# Patient Record
Sex: Female | Born: 1965 | Race: White | Hispanic: No | Marital: Married | Smoking: Former smoker
Health system: Southern US, Community
[De-identification: ages and names within clinical notes are randomized; demographics above are authoritative.]

## PROBLEM LIST (undated history)

## (undated) DIAGNOSIS — I10 Essential (primary) hypertension: Secondary | ICD-10-CM

## (undated) DIAGNOSIS — T7840XA Allergy, unspecified, initial encounter: Secondary | ICD-10-CM

## (undated) DIAGNOSIS — I493 Ventricular premature depolarization: Secondary | ICD-10-CM

## (undated) DIAGNOSIS — G43909 Migraine, unspecified, not intractable, without status migrainosus: Secondary | ICD-10-CM

## (undated) DIAGNOSIS — Z5189 Encounter for other specified aftercare: Secondary | ICD-10-CM

## (undated) DIAGNOSIS — F419 Anxiety disorder, unspecified: Secondary | ICD-10-CM

## (undated) DIAGNOSIS — D649 Anemia, unspecified: Secondary | ICD-10-CM

## (undated) DIAGNOSIS — E785 Hyperlipidemia, unspecified: Secondary | ICD-10-CM

## (undated) DIAGNOSIS — E079 Disorder of thyroid, unspecified: Secondary | ICD-10-CM

## (undated) DIAGNOSIS — G2581 Restless legs syndrome: Secondary | ICD-10-CM

## (undated) HISTORY — DX: Disorder of thyroid, unspecified: E07.9

## (undated) HISTORY — DX: Allergy, unspecified, initial encounter: T78.40XA

## (undated) HISTORY — DX: Ventricular premature depolarization: I49.3

## (undated) HISTORY — PX: CARPAL TUNNEL RELEASE: SHX101

## (undated) HISTORY — DX: Migraine, unspecified, not intractable, without status migrainosus: G43.909

## (undated) HISTORY — DX: Hyperlipidemia, unspecified: E78.5

## (undated) HISTORY — DX: Anemia, unspecified: D64.9

## (undated) HISTORY — DX: Essential (primary) hypertension: I10

## (undated) HISTORY — DX: Anxiety disorder, unspecified: F41.9

## (undated) HISTORY — PX: UPPER GASTROINTESTINAL ENDOSCOPY: SHX188

## (undated) HISTORY — PX: ENDOMETRIAL ABLATION: SHX621

## (undated) HISTORY — DX: Restless legs syndrome: G25.81

## (undated) HISTORY — DX: Encounter for other specified aftercare: Z51.89

---

## 1996-08-25 HISTORY — PX: ECTOPIC PREGNANCY SURGERY: SHX613

## 1999-04-26 HISTORY — PX: TUBAL LIGATION: SHX77

## 1999-08-25 ENCOUNTER — Emergency Department (HOSPITAL_COMMUNITY): Admission: EM | Admit: 1999-08-25 | Discharge: 1999-08-25 | Payer: Self-pay | Admitting: *Deleted

## 2000-10-19 ENCOUNTER — Other Ambulatory Visit: Admission: RE | Admit: 2000-10-19 | Discharge: 2000-10-19 | Payer: Self-pay | Admitting: Internal Medicine

## 2001-06-07 ENCOUNTER — Ambulatory Visit (HOSPITAL_COMMUNITY): Admission: RE | Admit: 2001-06-07 | Discharge: 2001-06-07 | Payer: Self-pay | Admitting: Internal Medicine

## 2001-06-07 ENCOUNTER — Encounter: Payer: Self-pay | Admitting: Internal Medicine

## 2001-06-15 ENCOUNTER — Encounter: Payer: Self-pay | Admitting: Internal Medicine

## 2001-06-15 ENCOUNTER — Ambulatory Visit (HOSPITAL_COMMUNITY): Admission: RE | Admit: 2001-06-15 | Discharge: 2001-06-15 | Payer: Self-pay | Admitting: Internal Medicine

## 2001-12-21 ENCOUNTER — Other Ambulatory Visit: Admission: RE | Admit: 2001-12-21 | Discharge: 2001-12-21 | Payer: Self-pay | Admitting: Internal Medicine

## 2001-12-25 ENCOUNTER — Ambulatory Visit (HOSPITAL_COMMUNITY): Admission: RE | Admit: 2001-12-25 | Discharge: 2001-12-25 | Payer: Self-pay | Admitting: Internal Medicine

## 2001-12-25 ENCOUNTER — Encounter: Payer: Self-pay | Admitting: Internal Medicine

## 2002-08-12 ENCOUNTER — Ambulatory Visit (HOSPITAL_BASED_OUTPATIENT_CLINIC_OR_DEPARTMENT_OTHER): Admission: RE | Admit: 2002-08-12 | Discharge: 2002-08-12 | Payer: Self-pay | Admitting: Orthopedic Surgery

## 2003-01-10 ENCOUNTER — Other Ambulatory Visit: Admission: RE | Admit: 2003-01-10 | Discharge: 2003-01-10 | Payer: Self-pay | Admitting: Obstetrics and Gynecology

## 2003-06-26 HISTORY — PX: CRYOABLATION: SHX1415

## 2004-01-24 HISTORY — PX: SEPTOPLASTY: SUR1290

## 2004-02-08 ENCOUNTER — Encounter (INDEPENDENT_AMBULATORY_CARE_PROVIDER_SITE_OTHER): Payer: Self-pay | Admitting: *Deleted

## 2004-02-08 ENCOUNTER — Ambulatory Visit (HOSPITAL_BASED_OUTPATIENT_CLINIC_OR_DEPARTMENT_OTHER): Admission: RE | Admit: 2004-02-08 | Discharge: 2004-02-08 | Payer: Self-pay | Admitting: Otolaryngology

## 2004-02-08 ENCOUNTER — Ambulatory Visit (HOSPITAL_COMMUNITY): Admission: RE | Admit: 2004-02-08 | Discharge: 2004-02-08 | Payer: Self-pay | Admitting: Otolaryngology

## 2004-12-18 ENCOUNTER — Ambulatory Visit: Payer: Self-pay | Admitting: Family Medicine

## 2004-12-18 ENCOUNTER — Other Ambulatory Visit: Admission: RE | Admit: 2004-12-18 | Discharge: 2004-12-18 | Payer: Self-pay | Admitting: Family Medicine

## 2004-12-20 ENCOUNTER — Encounter: Admission: RE | Admit: 2004-12-20 | Discharge: 2004-12-20 | Payer: Self-pay | Admitting: Family Medicine

## 2005-06-10 ENCOUNTER — Emergency Department (HOSPITAL_COMMUNITY): Admission: EM | Admit: 2005-06-10 | Discharge: 2005-06-10 | Payer: Self-pay | Admitting: Emergency Medicine

## 2005-06-16 ENCOUNTER — Ambulatory Visit: Payer: Self-pay | Admitting: Family Medicine

## 2005-06-26 ENCOUNTER — Encounter: Admission: RE | Admit: 2005-06-26 | Discharge: 2005-06-26 | Payer: Self-pay | Admitting: Family Medicine

## 2005-08-26 ENCOUNTER — Ambulatory Visit: Payer: Self-pay | Admitting: Internal Medicine

## 2005-11-17 ENCOUNTER — Ambulatory Visit: Payer: Self-pay | Admitting: Family Medicine

## 2005-11-17 ENCOUNTER — Other Ambulatory Visit: Admission: RE | Admit: 2005-11-17 | Discharge: 2005-11-17 | Payer: Self-pay | Admitting: Family Medicine

## 2005-12-17 ENCOUNTER — Encounter: Admission: RE | Admit: 2005-12-17 | Discharge: 2005-12-17 | Payer: Self-pay | Admitting: Family Medicine

## 2006-02-24 ENCOUNTER — Ambulatory Visit: Payer: Self-pay | Admitting: Family Medicine

## 2007-02-22 ENCOUNTER — Ambulatory Visit: Payer: Self-pay | Admitting: Family Medicine

## 2007-02-22 ENCOUNTER — Encounter: Payer: Self-pay | Admitting: Family Medicine

## 2007-02-22 ENCOUNTER — Other Ambulatory Visit: Admission: RE | Admit: 2007-02-22 | Discharge: 2007-02-22 | Payer: Self-pay | Admitting: Family Medicine

## 2007-02-22 DIAGNOSIS — O0081 Other ectopic pregnancy with intrauterine pregnancy: Secondary | ICD-10-CM | POA: Insufficient documentation

## 2007-02-22 DIAGNOSIS — R51 Headache: Secondary | ICD-10-CM | POA: Insufficient documentation

## 2007-02-22 DIAGNOSIS — Z8669 Personal history of other diseases of the nervous system and sense organs: Secondary | ICD-10-CM | POA: Insufficient documentation

## 2007-02-22 DIAGNOSIS — E039 Hypothyroidism, unspecified: Secondary | ICD-10-CM | POA: Insufficient documentation

## 2007-02-22 DIAGNOSIS — J342 Deviated nasal septum: Secondary | ICD-10-CM | POA: Insufficient documentation

## 2007-02-22 DIAGNOSIS — Z9889 Other specified postprocedural states: Secondary | ICD-10-CM | POA: Insufficient documentation

## 2007-02-22 DIAGNOSIS — R519 Headache, unspecified: Secondary | ICD-10-CM | POA: Insufficient documentation

## 2007-02-23 LAB — CONVERTED CEMR LAB
ALT: 18 units/L (ref 0–35)
AST: 21 units/L (ref 0–37)
Albumin: 3.9 g/dL (ref 3.5–5.2)
Alkaline Phosphatase: 62 units/L (ref 39–117)
BUN: 11 mg/dL (ref 6–23)
Basophils Absolute: 0 10*3/uL (ref 0.0–0.1)
Basophils Relative: 0.8 % (ref 0.0–1.0)
Bilirubin, Direct: 0.1 mg/dL (ref 0.0–0.3)
CO2: 27 meq/L (ref 19–32)
Calcium: 9.1 mg/dL (ref 8.4–10.5)
Chloride: 111 meq/L (ref 96–112)
Cholesterol: 249 mg/dL (ref 0–200)
Creatinine, Ser: 0.7 mg/dL (ref 0.4–1.2)
Direct LDL: 171.6 mg/dL
Eosinophils Absolute: 0.2 10*3/uL (ref 0.0–0.6)
Eosinophils Relative: 3.4 % (ref 0.0–5.0)
GFR calc Af Amer: 120 mL/min
GFR calc non Af Amer: 100 mL/min
Glucose, Bld: 90 mg/dL (ref 70–99)
HCT: 32 % — ABNORMAL LOW (ref 36.0–46.0)
HDL: 48.2 mg/dL (ref >39.0)
Hemoglobin: 10.6 g/dL — ABNORMAL LOW (ref 12.0–15.0)
Lymphocytes Relative: 37.8 % (ref 12.0–46.0)
MCHC: 33.3 g/dL (ref 30.0–36.0)
MCV: 78.5 fL (ref 78.0–100.0)
Monocytes Absolute: 0.3 10*3/uL (ref 0.2–0.7)
Monocytes Relative: 6.6 % (ref 3.0–11.0)
Neutro Abs: 2.7 10*3/uL (ref 1.4–7.7)
Neutrophils Relative %: 51.4 % (ref 43.0–77.0)
Platelets: 239 10*3/uL (ref 150–400)
Potassium: 3.8 meq/L (ref 3.5–5.1)
RBC: 4.08 M/uL (ref 3.87–5.11)
RDW: 14.9 % — ABNORMAL HIGH (ref 11.5–14.6)
Sodium: 140 meq/L (ref 135–145)
TSH: 11.27 microintl units/mL — ABNORMAL HIGH (ref 0.35–5.50)
Total Bilirubin: 0.9 mg/dL (ref 0.3–1.2)
Total CHOL/HDL Ratio: 5.2
Total Protein: 7.8 g/dL (ref 6.0–8.3)
Triglycerides: 117 mg/dL (ref 0–149)
VLDL: 23 mg/dL (ref 0–40)
WBC: 5.2 10*3/uL (ref 4.5–10.5)

## 2007-02-24 ENCOUNTER — Telehealth (INDEPENDENT_AMBULATORY_CARE_PROVIDER_SITE_OTHER): Payer: Self-pay | Admitting: *Deleted

## 2007-03-01 ENCOUNTER — Encounter (INDEPENDENT_AMBULATORY_CARE_PROVIDER_SITE_OTHER): Payer: Self-pay | Admitting: *Deleted

## 2007-03-03 ENCOUNTER — Telehealth (INDEPENDENT_AMBULATORY_CARE_PROVIDER_SITE_OTHER): Payer: Self-pay | Admitting: *Deleted

## 2007-03-03 DIAGNOSIS — N920 Excessive and frequent menstruation with regular cycle: Secondary | ICD-10-CM | POA: Insufficient documentation

## 2007-03-22 ENCOUNTER — Encounter: Payer: Self-pay | Admitting: Family Medicine

## 2007-07-12 ENCOUNTER — Telehealth (INDEPENDENT_AMBULATORY_CARE_PROVIDER_SITE_OTHER): Payer: Self-pay | Admitting: *Deleted

## 2007-07-12 ENCOUNTER — Ambulatory Visit: Payer: Self-pay | Admitting: Family Medicine

## 2007-07-12 DIAGNOSIS — L255 Unspecified contact dermatitis due to plants, except food: Secondary | ICD-10-CM | POA: Insufficient documentation

## 2007-08-05 ENCOUNTER — Ambulatory Visit (HOSPITAL_BASED_OUTPATIENT_CLINIC_OR_DEPARTMENT_OTHER): Admission: RE | Admit: 2007-08-05 | Discharge: 2007-08-05 | Payer: Self-pay | Admitting: Gynecology

## 2007-08-12 ENCOUNTER — Ambulatory Visit: Payer: Self-pay | Admitting: Family Medicine

## 2007-08-12 DIAGNOSIS — L259 Unspecified contact dermatitis, unspecified cause: Secondary | ICD-10-CM | POA: Insufficient documentation

## 2007-08-28 ENCOUNTER — Encounter (INDEPENDENT_AMBULATORY_CARE_PROVIDER_SITE_OTHER): Payer: Self-pay | Admitting: Family Medicine

## 2008-06-19 ENCOUNTER — Encounter: Payer: Self-pay | Admitting: Family Medicine

## 2008-06-20 ENCOUNTER — Ambulatory Visit: Payer: Self-pay | Admitting: Family Medicine

## 2008-06-20 ENCOUNTER — Encounter (INDEPENDENT_AMBULATORY_CARE_PROVIDER_SITE_OTHER): Payer: Self-pay | Admitting: *Deleted

## 2008-06-20 DIAGNOSIS — R0609 Other forms of dyspnea: Secondary | ICD-10-CM | POA: Insufficient documentation

## 2008-06-20 DIAGNOSIS — R0989 Other specified symptoms and signs involving the circulatory and respiratory systems: Secondary | ICD-10-CM | POA: Insufficient documentation

## 2008-06-20 DIAGNOSIS — M722 Plantar fascial fibromatosis: Secondary | ICD-10-CM | POA: Insufficient documentation

## 2008-06-25 LAB — CONVERTED CEMR LAB
ALT: 27 units/L (ref 0–35)
AST: 26 units/L (ref 0–37)
Albumin: 4 g/dL (ref 3.5–5.2)
Alkaline Phosphatase: 71 units/L (ref 39–117)
BUN: 13 mg/dL (ref 6–23)
Basophils Absolute: 0 10*3/uL (ref 0.0–0.1)
Basophils Relative: 0 % (ref 0.0–3.0)
Bilirubin, Direct: 0.1 mg/dL (ref 0.0–0.3)
CO2: 30 meq/L (ref 19–32)
Calcium: 9.1 mg/dL (ref 8.4–10.5)
Chloride: 105 meq/L (ref 96–112)
Cholesterol: 224 mg/dL (ref 0–200)
Creatinine, Ser: 0.7 mg/dL (ref 0.4–1.2)
Direct LDL: 138.3 mg/dL
Eosinophils Absolute: 0.2 10*3/uL (ref 0.0–0.7)
Eosinophils Relative: 3 % (ref 0.0–5.0)
GFR calc Af Amer: 119 mL/min
GFR calc non Af Amer: 99 mL/min
Glucose, Bld: 100 mg/dL — ABNORMAL HIGH (ref 70–99)
HCT: 40.2 % (ref 36.0–46.0)
HDL: 42.3 mg/dL (ref >39.0)
Hemoglobin: 14 g/dL (ref 12.0–15.0)
Lymphocytes Relative: 41.4 % (ref 12.0–46.0)
MCHC: 34.9 g/dL (ref 30.0–36.0)
MCV: 85.7 fL (ref 78.0–100.0)
Monocytes Absolute: 0.4 10*3/uL (ref 0.1–1.0)
Monocytes Relative: 7.2 % (ref 3.0–12.0)
Neutro Abs: 2.5 10*3/uL (ref 1.4–7.7)
Neutrophils Relative %: 48.4 % (ref 43.0–77.0)
Platelets: 233 10*3/uL (ref 150–400)
Potassium: 4.1 meq/L (ref 3.5–5.1)
RBC: 4.69 M/uL (ref 3.87–5.11)
RDW: 13.1 % (ref 11.5–14.6)
Sodium: 140 meq/L (ref 135–145)
Total Bilirubin: 0.8 mg/dL (ref 0.3–1.2)
Total CHOL/HDL Ratio: 5.3
Total Protein: 7.8 g/dL (ref 6.0–8.3)
Triglycerides: 171 mg/dL — ABNORMAL HIGH (ref 0–149)
VLDL: 34 mg/dL (ref 0–40)
WBC: 5.3 10*3/uL (ref 4.5–10.5)

## 2008-06-26 ENCOUNTER — Encounter (INDEPENDENT_AMBULATORY_CARE_PROVIDER_SITE_OTHER): Payer: Self-pay | Admitting: *Deleted

## 2008-07-03 ENCOUNTER — Encounter: Payer: Self-pay | Admitting: Family Medicine

## 2008-07-03 ENCOUNTER — Ambulatory Visit: Payer: Self-pay | Admitting: Pulmonary Disease

## 2008-07-03 DIAGNOSIS — G4700 Insomnia, unspecified: Secondary | ICD-10-CM | POA: Insufficient documentation

## 2008-07-19 ENCOUNTER — Ambulatory Visit: Payer: Self-pay | Admitting: Family Medicine

## 2008-07-19 ENCOUNTER — Telehealth (INDEPENDENT_AMBULATORY_CARE_PROVIDER_SITE_OTHER): Payer: Self-pay | Admitting: *Deleted

## 2008-07-19 DIAGNOSIS — R002 Palpitations: Secondary | ICD-10-CM | POA: Insufficient documentation

## 2008-07-19 DIAGNOSIS — R079 Chest pain, unspecified: Secondary | ICD-10-CM | POA: Insufficient documentation

## 2008-07-21 ENCOUNTER — Encounter (INDEPENDENT_AMBULATORY_CARE_PROVIDER_SITE_OTHER): Payer: Self-pay | Admitting: *Deleted

## 2008-07-27 ENCOUNTER — Ambulatory Visit: Payer: Self-pay | Admitting: Cardiovascular Disease

## 2008-08-12 LAB — CONVERTED CEMR LAB
ALT: 28 units/L (ref 0–35)
AST: 25 units/L (ref 0–37)
Albumin: 3.8 g/dL (ref 3.5–5.2)
Alkaline Phosphatase: 67 units/L (ref 39–117)
BUN: 11 mg/dL (ref 6–23)
Basophils Absolute: 0 10*3/uL (ref 0.0–0.1)
Basophils Relative: 0.1 % (ref 0.0–3.0)
Bilirubin, Direct: 0.1 mg/dL (ref 0.0–0.3)
CK-MB: 0.9 ng/mL (ref 0.3–4.0)
CO2: 28 meq/L (ref 19–32)
Calcium: 9.1 mg/dL (ref 8.4–10.5)
Chloride: 106 meq/L (ref 96–112)
Creatinine, Ser: 0.7 mg/dL (ref 0.4–1.2)
Eosinophils Absolute: 0.2 10*3/uL (ref 0.0–0.7)
Eosinophils Relative: 4.2 % (ref 0.0–5.0)
GFR calc Af Amer: 119 mL/min
GFR calc non Af Amer: 99 mL/min
Glucose, Bld: 84 mg/dL (ref 70–99)
HCT: 39.9 % (ref 36.0–46.0)
Hemoglobin: 13.7 g/dL (ref 12.0–15.0)
Lymphocytes Relative: 43.2 % (ref 12.0–46.0)
MCHC: 34.3 g/dL (ref 30.0–36.0)
MCV: 86.9 fL (ref 78.0–100.0)
Monocytes Absolute: 0.4 10*3/uL (ref 0.1–1.0)
Monocytes Relative: 7.5 % (ref 3.0–12.0)
Neutro Abs: 2.6 10*3/uL (ref 1.4–7.7)
Neutrophils Relative %: 45 % (ref 43.0–77.0)
Platelets: 257 10*3/uL (ref 150–400)
Potassium: 4.1 meq/L (ref 3.5–5.1)
RBC: 4.59 M/uL (ref 3.87–5.11)
RDW: 12.8 % (ref 11.5–14.6)
Sodium: 140 meq/L (ref 135–145)
TSH: 6.42 microintl units/mL — ABNORMAL HIGH (ref 0.35–5.50)
Total Bilirubin: 0.8 mg/dL (ref 0.3–1.2)
Total CK: 79 units/L (ref 7–177)
Total Protein: 7.7 g/dL (ref 6.0–8.3)
WBC: 5.6 10*3/uL (ref 4.5–10.5)

## 2008-08-14 ENCOUNTER — Telehealth (INDEPENDENT_AMBULATORY_CARE_PROVIDER_SITE_OTHER): Payer: Self-pay | Admitting: *Deleted

## 2008-08-14 ENCOUNTER — Encounter (INDEPENDENT_AMBULATORY_CARE_PROVIDER_SITE_OTHER): Payer: Self-pay | Admitting: *Deleted

## 2008-08-28 ENCOUNTER — Ambulatory Visit: Payer: Self-pay

## 2008-08-28 ENCOUNTER — Encounter: Payer: Self-pay | Admitting: Cardiovascular Disease

## 2008-09-21 ENCOUNTER — Ambulatory Visit: Payer: Self-pay | Admitting: Family Medicine

## 2008-09-21 DIAGNOSIS — R109 Unspecified abdominal pain: Secondary | ICD-10-CM | POA: Insufficient documentation

## 2008-09-22 ENCOUNTER — Encounter (INDEPENDENT_AMBULATORY_CARE_PROVIDER_SITE_OTHER): Payer: Self-pay | Admitting: *Deleted

## 2008-09-22 ENCOUNTER — Encounter: Admission: RE | Admit: 2008-09-22 | Discharge: 2008-09-22 | Payer: Self-pay | Admitting: Family Medicine

## 2008-09-22 ENCOUNTER — Telehealth: Payer: Self-pay | Admitting: Family Medicine

## 2008-09-22 LAB — CONVERTED CEMR LAB
ALT: 21 units/L (ref 0–35)
AST: 21 units/L (ref 0–37)
Albumin: 3.9 g/dL (ref 3.5–5.2)
Alkaline Phosphatase: 64 units/L (ref 39–117)
BUN: 10 mg/dL (ref 6–23)
Bilirubin, Direct: 0.1 mg/dL (ref 0.0–0.3)
CO2: 28 meq/L (ref 19–32)
Calcium: 9.1 mg/dL (ref 8.4–10.5)
Chloride: 106 meq/L (ref 96–112)
Creatinine, Ser: 0.7 mg/dL (ref 0.4–1.2)
GFR calc Af Amer: 119 mL/min
GFR calc non Af Amer: 99 mL/min
Glucose, Bld: 95 mg/dL (ref 70–99)
Potassium: 3.6 meq/L (ref 3.5–5.1)
Sodium: 139 meq/L (ref 135–145)
TSH: 0.92 microintl units/mL (ref 0.35–5.50)
Total Bilirubin: 0.7 mg/dL (ref 0.3–1.2)
Total Protein: 6.9 g/dL (ref 6.0–8.3)

## 2008-09-28 ENCOUNTER — Ambulatory Visit: Payer: Self-pay | Admitting: Family Medicine

## 2008-09-28 DIAGNOSIS — K3189 Other diseases of stomach and duodenum: Secondary | ICD-10-CM | POA: Insufficient documentation

## 2008-09-28 DIAGNOSIS — R1013 Epigastric pain: Secondary | ICD-10-CM

## 2008-10-18 ENCOUNTER — Ambulatory Visit: Payer: Self-pay | Admitting: Gastroenterology

## 2008-10-27 ENCOUNTER — Ambulatory Visit: Payer: Self-pay | Admitting: Gastroenterology

## 2008-11-02 ENCOUNTER — Telehealth (INDEPENDENT_AMBULATORY_CARE_PROVIDER_SITE_OTHER): Payer: Self-pay | Admitting: *Deleted

## 2008-11-06 ENCOUNTER — Telehealth: Payer: Self-pay | Admitting: Gastroenterology

## 2008-11-07 ENCOUNTER — Ambulatory Visit: Payer: Self-pay | Admitting: Gastroenterology

## 2008-11-14 ENCOUNTER — Ambulatory Visit (HOSPITAL_COMMUNITY): Admission: RE | Admit: 2008-11-14 | Discharge: 2008-11-14 | Payer: Self-pay | Admitting: Gastroenterology

## 2008-12-08 ENCOUNTER — Telehealth: Payer: Self-pay | Admitting: Gastroenterology

## 2008-12-20 ENCOUNTER — Telehealth (INDEPENDENT_AMBULATORY_CARE_PROVIDER_SITE_OTHER): Payer: Self-pay | Admitting: *Deleted

## 2008-12-20 ENCOUNTER — Telehealth: Payer: Self-pay | Admitting: Gastroenterology

## 2009-01-08 ENCOUNTER — Ambulatory Visit: Payer: Self-pay | Admitting: Gastroenterology

## 2009-01-08 ENCOUNTER — Ambulatory Visit (HOSPITAL_COMMUNITY): Admission: RE | Admit: 2009-01-08 | Discharge: 2009-01-08 | Payer: Self-pay | Admitting: Gastroenterology

## 2009-01-10 ENCOUNTER — Telehealth: Payer: Self-pay | Admitting: Gastroenterology

## 2009-01-19 ENCOUNTER — Ambulatory Visit: Payer: Self-pay | Admitting: Family Medicine

## 2009-01-19 DIAGNOSIS — E785 Hyperlipidemia, unspecified: Secondary | ICD-10-CM | POA: Insufficient documentation

## 2009-01-19 DIAGNOSIS — E876 Hypokalemia: Secondary | ICD-10-CM | POA: Insufficient documentation

## 2009-01-23 ENCOUNTER — Encounter (INDEPENDENT_AMBULATORY_CARE_PROVIDER_SITE_OTHER): Payer: Self-pay | Admitting: *Deleted

## 2009-01-23 LAB — CONVERTED CEMR LAB
BUN: 13 mg/dL (ref 6–23)
CO2: 30 meq/L (ref 19–32)
Calcium: 9 mg/dL (ref 8.4–10.5)
Chloride: 108 meq/L (ref 96–112)
Creatinine, Ser: 0.6 mg/dL (ref 0.4–1.2)
GFR calc non Af Amer: 117.25 mL/min (ref >60)
Glucose, Bld: 81 mg/dL (ref 70–99)
Potassium: 4.3 meq/L (ref 3.5–5.1)
Sodium: 141 meq/L (ref 135–145)
TSH: 0.63 microintl units/mL (ref 0.35–5.50)

## 2009-01-24 ENCOUNTER — Telehealth (INDEPENDENT_AMBULATORY_CARE_PROVIDER_SITE_OTHER): Payer: Self-pay | Admitting: *Deleted

## 2009-01-25 ENCOUNTER — Ambulatory Visit: Payer: Self-pay | Admitting: Family Medicine

## 2009-01-26 ENCOUNTER — Telehealth (INDEPENDENT_AMBULATORY_CARE_PROVIDER_SITE_OTHER): Payer: Self-pay | Admitting: *Deleted

## 2009-01-29 ENCOUNTER — Encounter: Payer: Self-pay | Admitting: Family Medicine

## 2009-02-01 ENCOUNTER — Ambulatory Visit: Payer: Self-pay | Admitting: Family Medicine

## 2009-02-01 ENCOUNTER — Ambulatory Visit: Payer: Self-pay | Admitting: Cardiology

## 2009-02-01 DIAGNOSIS — F411 Generalized anxiety disorder: Secondary | ICD-10-CM

## 2009-02-01 DIAGNOSIS — M549 Dorsalgia, unspecified: Secondary | ICD-10-CM | POA: Insufficient documentation

## 2009-02-01 DIAGNOSIS — F419 Anxiety disorder, unspecified: Secondary | ICD-10-CM | POA: Insufficient documentation

## 2009-02-01 LAB — CONVERTED CEMR LAB
Bilirubin Urine: NEGATIVE
Glucose, Urine, Semiquant: NEGATIVE
Ketones, urine, test strip: NEGATIVE
Nitrite: NEGATIVE
Protein, U semiquant: NEGATIVE
Specific Gravity, Urine: 1.02
Urobilinogen, UA: NEGATIVE
WBC Urine, dipstick: NEGATIVE
pH: 6.5

## 2009-02-05 ENCOUNTER — Encounter (INDEPENDENT_AMBULATORY_CARE_PROVIDER_SITE_OTHER): Payer: Self-pay | Admitting: *Deleted

## 2009-02-08 ENCOUNTER — Telehealth (INDEPENDENT_AMBULATORY_CARE_PROVIDER_SITE_OTHER): Payer: Self-pay | Admitting: *Deleted

## 2009-06-04 HISTORY — PX: BUNIONECTOMY: SHX129

## 2009-08-29 ENCOUNTER — Ambulatory Visit: Payer: Self-pay | Admitting: Family Medicine

## 2009-08-31 ENCOUNTER — Encounter (INDEPENDENT_AMBULATORY_CARE_PROVIDER_SITE_OTHER): Payer: Self-pay | Admitting: *Deleted

## 2009-08-31 LAB — CONVERTED CEMR LAB
ALT: 24 units/L (ref 0–35)
AST: 24 units/L (ref 0–37)
Albumin: 3.9 g/dL (ref 3.5–5.2)
Alkaline Phosphatase: 70 units/L (ref 39–117)
Bilirubin, Direct: 0.1 mg/dL (ref 0.0–0.3)
Total Bilirubin: 0.9 mg/dL (ref 0.3–1.2)
Total Protein: 7.6 g/dL (ref 6.0–8.3)

## 2009-10-29 ENCOUNTER — Encounter (INDEPENDENT_AMBULATORY_CARE_PROVIDER_SITE_OTHER): Payer: Self-pay | Admitting: *Deleted

## 2009-10-29 ENCOUNTER — Ambulatory Visit: Payer: Self-pay | Admitting: Family Medicine

## 2009-10-29 ENCOUNTER — Other Ambulatory Visit: Admission: RE | Admit: 2009-10-29 | Discharge: 2009-10-29 | Payer: Self-pay | Admitting: Family Medicine

## 2009-10-29 LAB — HM PAP SMEAR: HM Pap smear: NEGATIVE

## 2009-10-31 ENCOUNTER — Encounter (INDEPENDENT_AMBULATORY_CARE_PROVIDER_SITE_OTHER): Payer: Self-pay | Admitting: *Deleted

## 2009-10-31 LAB — CONVERTED CEMR LAB: Pap Smear: NEGATIVE

## 2009-11-05 ENCOUNTER — Encounter: Payer: Self-pay | Admitting: Internal Medicine

## 2009-11-05 ENCOUNTER — Ambulatory Visit: Payer: Self-pay

## 2009-11-07 ENCOUNTER — Telehealth (INDEPENDENT_AMBULATORY_CARE_PROVIDER_SITE_OTHER): Payer: Self-pay | Admitting: *Deleted

## 2009-11-07 LAB — CONVERTED CEMR LAB
ALT: 24 units/L (ref 0–35)
AST: 25 units/L (ref 0–37)
Albumin: 4.1 g/dL (ref 3.5–5.2)
Alkaline Phosphatase: 69 units/L (ref 39–117)
BUN: 9 mg/dL (ref 6–23)
Basophils Absolute: 0 10*3/uL (ref 0.0–0.1)
Basophils Relative: 0.4 % (ref 0.0–3.0)
Bilirubin, Direct: 0.1 mg/dL (ref 0.0–0.3)
CO2: 30 meq/L (ref 19–32)
Calcium: 9.1 mg/dL (ref 8.4–10.5)
Chloride: 104 meq/L (ref 96–112)
Cholesterol: 160 mg/dL (ref 0–200)
Creatinine, Ser: 0.7 mg/dL (ref 0.4–1.2)
Eosinophils Absolute: 0.1 10*3/uL (ref 0.0–0.7)
Eosinophils Relative: 2.9 % (ref 0.0–5.0)
GFR calc non Af Amer: 97.76 mL/min (ref >60)
Glucose, Bld: 91 mg/dL (ref 70–99)
HCT: 40.4 % (ref 36.0–46.0)
HDL: 58.4 mg/dL (ref >39.00)
Hemoglobin: 13.6 g/dL (ref 12.0–15.0)
LDL Cholesterol: 71 mg/dL (ref 0–99)
Lymphocytes Relative: 37.1 % (ref 12.0–46.0)
Lymphs Abs: 1.8 10*3/uL (ref 0.7–4.0)
MCHC: 33.7 g/dL (ref 30.0–36.0)
MCV: 88 fL (ref 78.0–100.0)
Monocytes Absolute: 0.4 10*3/uL (ref 0.1–1.0)
Monocytes Relative: 8 % (ref 3.0–12.0)
Neutro Abs: 2.6 10*3/uL (ref 1.4–7.7)
Neutrophils Relative %: 51.6 % (ref 43.0–77.0)
Platelets: 228 10*3/uL (ref 150.0–400.0)
Potassium: 4.2 meq/L (ref 3.5–5.1)
RBC: 4.6 M/uL (ref 3.87–5.11)
RDW: 12.3 % (ref 11.5–14.6)
Sodium: 139 meq/L (ref 135–145)
TSH: 0.34 microintl units/mL — ABNORMAL LOW (ref 0.35–5.50)
Total Bilirubin: 0.7 mg/dL (ref 0.3–1.2)
Total CHOL/HDL Ratio: 3
Total Protein: 7.5 g/dL (ref 6.0–8.3)
Triglycerides: 153 mg/dL — ABNORMAL HIGH (ref 0.0–149.0)
VLDL: 30.6 mg/dL (ref 0.0–40.0)
WBC: 4.9 10*3/uL (ref 4.5–10.5)

## 2009-11-12 ENCOUNTER — Ambulatory Visit: Payer: Self-pay | Admitting: Family Medicine

## 2009-11-12 LAB — CONVERTED CEMR LAB: Fecal Occult Bld: NEGATIVE

## 2009-12-12 ENCOUNTER — Ambulatory Visit: Payer: Self-pay | Admitting: Pulmonary Disease

## 2009-12-12 DIAGNOSIS — G4730 Sleep apnea, unspecified: Secondary | ICD-10-CM | POA: Insufficient documentation

## 2009-12-13 DIAGNOSIS — G2581 Restless legs syndrome: Secondary | ICD-10-CM | POA: Insufficient documentation

## 2009-12-16 ENCOUNTER — Emergency Department (HOSPITAL_BASED_OUTPATIENT_CLINIC_OR_DEPARTMENT_OTHER): Admission: EM | Admit: 2009-12-16 | Discharge: 2009-12-17 | Payer: Self-pay | Admitting: Emergency Medicine

## 2010-01-03 ENCOUNTER — Encounter: Payer: Self-pay | Admitting: Pulmonary Disease

## 2010-01-03 ENCOUNTER — Ambulatory Visit (HOSPITAL_BASED_OUTPATIENT_CLINIC_OR_DEPARTMENT_OTHER): Admission: RE | Admit: 2010-01-03 | Discharge: 2010-01-03 | Payer: Self-pay | Admitting: Pulmonary Disease

## 2010-01-09 ENCOUNTER — Ambulatory Visit: Payer: Self-pay | Admitting: Pulmonary Disease

## 2010-01-23 ENCOUNTER — Ambulatory Visit: Payer: Self-pay | Admitting: Family Medicine

## 2010-01-24 LAB — CONVERTED CEMR LAB
ALT: 49 units/L — ABNORMAL HIGH (ref 0–35)
AST: 47 units/L — ABNORMAL HIGH (ref 0–37)
Albumin: 4.4 g/dL (ref 3.5–5.2)
Alkaline Phosphatase: 76 units/L (ref 39–117)
BUN: 11 mg/dL (ref 6–23)
Bilirubin, Direct: 0.1 mg/dL (ref 0.0–0.3)
CO2: 29 meq/L (ref 19–32)
Calcium: 9.3 mg/dL (ref 8.4–10.5)
Chloride: 104 meq/L (ref 96–112)
Creatinine, Ser: 0.7 mg/dL (ref 0.4–1.2)
GFR calc non Af Amer: 106.37 mL/min (ref >60)
Glucose, Bld: 84 mg/dL (ref 70–99)
Potassium: 4.6 meq/L (ref 3.5–5.1)
Sodium: 139 meq/L (ref 135–145)
TSH: 0.86 microintl units/mL (ref 0.35–5.50)
Total Bilirubin: 0.7 mg/dL (ref 0.3–1.2)
Total Protein: 7.6 g/dL (ref 6.0–8.3)

## 2010-01-30 ENCOUNTER — Telehealth: Payer: Self-pay | Admitting: Pulmonary Disease

## 2010-01-30 ENCOUNTER — Telehealth: Payer: Self-pay | Admitting: Family Medicine

## 2010-02-06 ENCOUNTER — Ambulatory Visit: Payer: Self-pay | Admitting: Family Medicine

## 2010-02-07 ENCOUNTER — Telehealth (INDEPENDENT_AMBULATORY_CARE_PROVIDER_SITE_OTHER): Payer: Self-pay | Admitting: *Deleted

## 2010-02-07 LAB — CONVERTED CEMR LAB
ALT: 27 units/L (ref 0–35)
AST: 30 units/L (ref 0–37)
Albumin: 4.3 g/dL (ref 3.5–5.2)
Alkaline Phosphatase: 79 units/L (ref 39–117)
Bilirubin, Direct: 0.1 mg/dL (ref 0.0–0.3)
GGT: 18 units/L (ref 7–51)
HCV Ab: NEGATIVE
Hep A IgM: NEGATIVE
Hep B C IgM: NEGATIVE
Hepatitis B Surface Ag: NEGATIVE
Total Bilirubin: 0.4 mg/dL (ref 0.3–1.2)
Total Protein: 7.3 g/dL (ref 6.0–8.3)

## 2010-02-11 ENCOUNTER — Telehealth (INDEPENDENT_AMBULATORY_CARE_PROVIDER_SITE_OTHER): Payer: Self-pay | Admitting: *Deleted

## 2010-02-20 ENCOUNTER — Telehealth: Payer: Self-pay | Admitting: Pulmonary Disease

## 2010-02-21 ENCOUNTER — Ambulatory Visit: Payer: Self-pay | Admitting: Pulmonary Disease

## 2010-02-27 LAB — CONVERTED CEMR LAB
Ferritin: 10.5 ng/mL (ref 10.0–291.0)
Iron: 78 ug/dL (ref 42–145)
Saturation Ratios: 17.5 % — ABNORMAL LOW (ref 20.0–50.0)
Transferrin: 318.4 mg/dL (ref 212.0–360.0)

## 2010-03-28 ENCOUNTER — Ambulatory Visit: Payer: Self-pay | Admitting: Family Medicine

## 2010-03-28 DIAGNOSIS — R350 Frequency of micturition: Secondary | ICD-10-CM | POA: Insufficient documentation

## 2010-03-28 LAB — CONVERTED CEMR LAB
Bilirubin Urine: NEGATIVE
Blood in Urine, dipstick: NEGATIVE
Glucose, Urine, Semiquant: NEGATIVE
Ketones, urine, test strip: NEGATIVE
Nitrite: NEGATIVE
Protein, U semiquant: NEGATIVE
Specific Gravity, Urine: 1.01
Urobilinogen, UA: 0.2
WBC Urine, dipstick: NEGATIVE
pH: 6

## 2010-04-18 ENCOUNTER — Ambulatory Visit: Payer: Self-pay | Admitting: Family Medicine

## 2010-04-18 DIAGNOSIS — Z87448 Personal history of other diseases of urinary system: Secondary | ICD-10-CM | POA: Insufficient documentation

## 2010-04-18 LAB — CONVERTED CEMR LAB
Beta hcg, urine, semiquantitative: NEGATIVE
Bilirubin Urine: NEGATIVE
Glucose, Urine, Semiquant: NEGATIVE
Nitrite: NEGATIVE
Specific Gravity, Urine: 1.02
Urobilinogen, UA: 0.2
WBC Urine, dipstick: NEGATIVE
pH: 5

## 2010-04-19 ENCOUNTER — Encounter: Admission: RE | Admit: 2010-04-19 | Discharge: 2010-04-19 | Payer: Self-pay | Admitting: Family Medicine

## 2010-04-19 ENCOUNTER — Ambulatory Visit: Payer: Self-pay | Admitting: Cardiology

## 2010-04-22 LAB — CONVERTED CEMR LAB
ALT: 32 units/L (ref 0–35)
AST: 27 units/L (ref 0–37)
Albumin: 4.7 g/dL (ref 3.5–5.2)
Alkaline Phosphatase: 78 units/L (ref 39–117)
Amylase: 78 units/L (ref 27–131)
BUN: 13 mg/dL (ref 6–23)
Basophils Absolute: 0 10*3/uL (ref 0.0–0.1)
Basophils Relative: 0.3 % (ref 0.0–3.0)
Bilirubin, Direct: 0.2 mg/dL (ref 0.0–0.3)
CO2: 28 meq/L (ref 19–32)
Calcium: 9.9 mg/dL (ref 8.4–10.5)
Chloride: 102 meq/L (ref 96–112)
Creatinine, Ser: 0.8 mg/dL (ref 0.4–1.2)
Eosinophils Absolute: 0.1 10*3/uL (ref 0.0–0.7)
Eosinophils Relative: 1.9 % (ref 0.0–5.0)
GFR calc non Af Amer: 88.71 mL/min (ref >60)
Glucose, Bld: 105 mg/dL — ABNORMAL HIGH (ref 70–99)
H Pylori IgG: NEGATIVE
HCT: 45.7 % (ref 36.0–46.0)
Hemoglobin: 15.6 g/dL — ABNORMAL HIGH (ref 12.0–15.0)
Lipase: 39 units/L (ref 11.0–59.0)
Lymphocytes Relative: 39.7 % (ref 12.0–46.0)
Lymphs Abs: 2.8 10*3/uL (ref 0.7–4.0)
MCHC: 34.3 g/dL (ref 30.0–36.0)
MCV: 90.4 fL (ref 78.0–100.0)
Monocytes Absolute: 0.6 10*3/uL (ref 0.1–1.0)
Monocytes Relative: 7.9 % (ref 3.0–12.0)
Neutro Abs: 3.5 10*3/uL (ref 1.4–7.7)
Neutrophils Relative %: 50.2 % (ref 43.0–77.0)
Platelets: 283 10*3/uL (ref 150.0–400.0)
Potassium: 4.5 meq/L (ref 3.5–5.1)
RBC: 5.05 M/uL (ref 3.87–5.11)
RDW: 12.9 % (ref 11.5–14.6)
Sodium: 139 meq/L (ref 135–145)
Total Bilirubin: 0.9 mg/dL (ref 0.3–1.2)
Total Protein: 7.9 g/dL (ref 6.0–8.3)
WBC: 7 10*3/uL (ref 4.5–10.5)

## 2010-04-24 ENCOUNTER — Telehealth: Payer: Self-pay | Admitting: Family Medicine

## 2010-05-06 ENCOUNTER — Ambulatory Visit: Payer: Self-pay | Admitting: Pulmonary Disease

## 2010-05-29 ENCOUNTER — Encounter: Payer: Self-pay | Admitting: Family Medicine

## 2010-06-21 ENCOUNTER — Encounter: Payer: Self-pay | Admitting: Family Medicine

## 2010-07-17 ENCOUNTER — Telehealth (INDEPENDENT_AMBULATORY_CARE_PROVIDER_SITE_OTHER): Payer: Self-pay | Admitting: *Deleted

## 2010-08-20 ENCOUNTER — Telehealth: Payer: Self-pay | Admitting: Internal Medicine

## 2010-09-24 NOTE — Assessment & Plan Note (Signed)
Summary: RASH NOT BETTER,CBS   Vital Signs:  Patient Profile:   45 Years Old Female Weight:      174 pounds Temp:     98.5 degrees F oral Pulse rate:   72 / minute Resp:     14 per minute  / 78  (right arm)  Pt. in pain?   no  Vitals Entered By: Ardyth Man (August 12, 2007 11:20 AM)                  PCP:  Laury Axon  Chief Complaint:  Rash on back and on left side and left side of hairline of face and right leg x 2 months.  History of Present Illness: Patient seen 1 month ago with a rash which was consistent at that time with Contact dermatitis. Patient reports it comes and goes and doesn't appear to respone to the Elocon cream previously provided. Patient reports that her son and another family member have been treated for a rash recently. Doctors have felt it was eczema or Hives in the other family members. Rash is pruritic       Review of Systems  Derm      Complains of itching.      Denies dryness and flushing.      No h/o eczema   Physical Exam  General:     Well-developed,well-nourished,in no acute distress; alert,appropriate and cooperative throughout examination Skin:     Mid low back: erythematous macular/papular plaque like lesion about 2X 2 inch in diameter. No other rashes noted except for small erythematous papule on right anterior mid tibia.    Impression & Recommendations:  Problem # 1:  DERMATITIS (ICD-692.9) Of unclear etiology. Will treat as fungal infection with Spectazole cream two times a day for 14 days or 1 week more after resolution Will refer to Dermatology for further evaluation in case no improvement. May need a shave biopsy if no resolution. The following medications were removed from the medication list:    Elocon 0.1 % Crea (Mometasone furoate) .Marland Kitchen... Apply  once daily  Orders: Dermatology Referral (Derma)   Complete Medication List: 1)  Levothyroxine Sodium 112 Mcg Tabs (Levothyroxine sodium) .... Take 1 tablet by mouth  every other day 2)  Fluoxetine Hcl 10 Mg Caps (Fluoxetine hcl) .Marland Kitchen.. 1 daily 3)  Detrol La 4 Mg Cp24 (Tolterodine tartrate) .... Take one daily 4)  Econazole Nitrate 1 % Crea (Econazole nitrate) .... Apply two times a day     Prescriptions: ECONAZOLE NITRATE 1 %  CREA (ECONAZOLE NITRATE) apply two times a day  #45 grams x 0   Entered and Authorized by:   Leanne Chang MD   Signed by:   Leanne Chang MD on 08/12/2007   Method used:   Print then Give to Patient   RxID:   [id number]  ]

## 2010-09-24 NOTE — Progress Notes (Signed)
Summary: Refill Request  Phone Note Refill Request Call back at [phone] Message from:  Pharmacy on July 17, 2010 8:47 AM  Refills Requested: Medication #1:  LIPITOR 20 MG TABS 1 by mouth at bedtime   Dosage confirmed as above?Dosage Confirmed   Supply Requested: 1 month   Last Refilled: 06/18/2010 [facility] on [address]   Next Appointment Scheduled: none Initial call taken by: Harold Barban,  July 17, 2010 8:48 AM    Prescriptions: LIPITOR 20 MG TABS (ATORVASTATIN CALCIUM) 1 by mouth at bedtime  #30 Each x 2   Entered by:   Almeta Monas CMA (AAMA)   Authorized by:   Loreen Freud DO   Signed by:   Almeta Monas CMA (AAMA) on 07/17/2010   Method used:   Electronically to        [facility] (retail)       [address]       [place], [place]  [address]       Ph: [phone]       Fax: [phone]   RxID:   [id number]

## 2010-09-24 NOTE — Progress Notes (Signed)
Summary: sleep study results  Phone Note Call from Patient Call back at Home Phone [phone]   Call For: Adaleena Mooers Reason for Call: Lab or Test Results Summary of Call: Requesting sleep study results. Initial call taken by: Lehman Prom,  January 30, 2010 1:22 PM  Follow-up for Phone Call        Pt had sleep study on 01-03-10 have you seen these results? Pelase advsie. Carron Curie CMA  January 30, 2010 1:35 PM  mild sleep disordered breathing - OV to discuss options.  Follow-up by: Comer Locket Vassie Loll MD,  January 31, 2010 8:51 AM  Additional Follow-up for Phone Call Additional follow up Details #1::        pt advised and set to see RA on 6-28. Carron Curie CMA  January 31, 2010 9:12 AM

## 2010-09-24 NOTE — Letter (Signed)
Summary: Results Follow up Letter  [facility] [place]  [address] [place], [place] [address]   Phone: [phone]  Fax: [phone]    08/31/2009 MRN: [id number]  Katie [name] [address] [place], [place]  [address]  Dear Ms. SPERRY,  The following are the results of your recent test(s):  Test         Result    Pap Smear:        Normal _____  Not Normal _____ Comments: ______________________________________________________ Cholesterol: LDL(Bad cholesterol):         Your goal is less than:         HDL (Good cholesterol):       Your goal is more than: Comments:  ______________________________________________________ Mammogram:        Normal _____  Not Normal _____ Comments:  ___________________________________________________________________ Hemoccult:        Normal _____  Not normal _______ Comments:    _____________________________________________________________________ Other Tests:  See attachment for results.   We routinely do not discuss normal results over the telephone.  If you desire a copy of the results, or you have any questions about this information we can discuss them at your next office visit.   Sincerely,    Army Fossa CMA  August 31, 2009 2:34 PM

## 2010-09-24 NOTE — Progress Notes (Signed)
Summary: DISCUSS LABS  Phone Note Outgoing Call Call back at [facility] Phone [phone]   Call placed by: Shonna Chock,  February 24, 2007 8:36 AM Details for Reason: DISCUSS LABS Summary of Call: [facility] @ 8:31 AM  Follow-up for Phone Call        [facility] AT HOME NUMBER, CALLED PATIENT AT WORK. PATIENT WOULD LIKE FOR DR.LOWNE TO CALL HER KG:MWNU. PT SAID THE LAST TIME THYROID MED CHANGED-HEART PALPITATIONS,PT STATED THAT CHLOSTEROL WAS HIGH LAST YEAR AND SHE KEEPS BEING TOLD TO DIET AND EXCERISE,(MABE SOMETHING ELSE NEEDS TO BE DONE.)PT DID OK TAKING IRON AND WE SCHEDULED APPOINTMENT TO RECHECK ALL LABS IN OCTOBER (2008) Follow-up by: Shonna Chock,  February 24, 2007 3:20 PM  Additional Follow-up for Phone Call Additional follow up Details #1::        Will refer Dr Talmage Nap for thyroid CAll in Zocor to 40 mg 1 a day with 2 refills--  to [facility] on [address].Marland Kitchen   i spoke with pt--   she needs an appt.  for labs for 1 month to check anemia 285.9  CBCD, ibc panal and ferritin  and 272.4  hep and lipid in 3 months Additional Follow-up by: Loreen Freud DO,  February 25, 2007 5:01 PM  New/Updated Medications: ZOCOR 40 MG  TABS (SIMVASTATIN) 1 by mouth once daily  Additional Follow-up for Phone Call Additional follow up Details #2::    CALLED IN RX. LMOM INFORMING PT RX READY FOR PICK-UP Follow-up by: Shonna Chock,  February 25, 2007 5:01 PM  New/Updated Medications: ZOCOR 40 MG  TABS (SIMVASTATIN) 1 by mouth once daily  Prescriptions: ZOCOR 40 MG  TABS (SIMVASTATIN) 1 by mouth once daily  #30 x 2   Entered and Authorized by:   Loreen Freud DO   Signed by:   Loreen Freud DO on 02/25/2007   Method used:   Print then Give to Patient   RxID:   [id number]

## 2010-09-24 NOTE — Assessment & Plan Note (Signed)
Summary: STILL HAVING BACK PAIN/KN   Vital Signs:  Patient profile:   45 year old female Weight:      175.6 pounds BMI:     31.22 Temp:     98.3 degrees F oral BP sitting:   120 / 80  (left arm)  Vitals Entered By: Almeta Monas CMA Duncan Dull) (April 18, 2010 1:46 PM) CC: c/o right sided back and abdomen pain, Abdominal Pain   History of Present Illness:       This is a 45 year old woman who presents with Abdominal Pain.  Pt had full GB w/u over 1 year ago and everything was negative.  Pain has worsened some.  The patient denies nausea, vomiting, diarrhea, constipation, melena, hematochezia, anorexia, and hematemesis.  The location of the pain is right upper quadrant.  The pain is described as intermittent, sharp, and radiating to the back.  The patient denies the following symptoms: fever, weight loss, dysuria, chest pain, jaundice, dark urine, missed menstrual period, and vaginal bleeding.    Current Medications (verified): 1)  Synthroid 112 Mcg Tabs (Levothyroxine Sodium) .Marland Kitchen.. 1 By Mouth Tues, Thurs , Fri, Sat , Sun 2)  Pristiq 50 Mg Xr24h-Tab (Desvenlafaxine Succinate) .Marland Kitchen.. 1 By Mouth Once Daily 3)  Clobetasol Propionate 0.05 % Crea (Clobetasol Propionate) .... Apply As Directed 4)  Potassium Gluconate 595 Mg Tabs (Potassium Gluconate) .... Take 1 Tablet By Mouth Once A Day 5)  Metrogel 1 % Gel (Metronidazole) .... Apply Daily For Rosacea 6)  Alprazolam 0.25 Mg Tabs (Alprazolam) .Marland Kitchen.. 1 By Mouth Three Times A Day As Needed 7)  Lipitor 20 Mg Tabs (Atorvastatin Calcium) .Marland Kitchen.. 1 By Mouth At Bedtime 8)  Synthroid 125 Mcg Tabs (Levothyroxine Sodium) .Marland Kitchen.. 1 By Mouth Mw 9)  Vicodin Es 7.5-750 Mg Tabs (Hydrocodone-Acetaminophen) .Marland Kitchen.. 1 By Mouth Every 6 Hours As Needed  Allergies (verified): 1)  ! Morphine  Past History:  Past medical, surgical, family and social histories (including risk factors) reviewed for relevance to current acute and chronic problems.  Past Medical  History: Reviewed history from 01/19/2009 and no changes required. 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 Tubal ligation (04/1999) Carpal tunnel release  B/l--   1996, 2003 septoplasty 01/2004 cryoablation 11/04 ectopic pregnancy 1988 bunion and plantar facsitis-- R foot-- 06/04/2009  Family History: Reviewed history from 12/12/2009 and no changes required. F- Suicide, depression M- brain aneurysm MGM- DM, alz. dementia, uterine CA, HTN, osteoporosis, OA, deceased at 43yo PGM-- intestinal CA ? PGF- Cancer? grandmother had colon polyps No colon cancer allergies: sisters, maternal grandmother  maternal grandfather - lung  Social History: Reviewed history from 12/12/2009 and no changes required. 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  Quit in 1997.  started at age 34.  1 ppd. Alcohol use-no Drug use-no Regular exercise-yes   Review of Systems      See HPI  Physical Exam  General:  Well-developed,well-nourished,in no acute distress; alert,appropriate and cooperative throughout examination Abdomen:  + Ruq rebound tenderness with palpation soft, normal bowel sounds, no masses, and no rigidity.   Psych:  Oriented X3, memory intact for recent and remote, and normally interactive.     Impression & Recommendations:  Problem # 1:  ABDOMINAL PAIN OTHER SPECIFIED SITE  (ICD-789.09) recheck [place] abd ---  if pain increases or any other symptoms develop go to ER  Her updated medication list for this problem includes:    Vicodin Es 7.5-750 Mg Tabs (Hydrocodone-acetaminophen) .Marland Kitchen... 1 by mouth every 6 hours as needed  Orders: Venipuncture (91478) TLB-BMP (Basic Metabolic Panel-BMET) (80048-METABOL) TLB-CBC Platelet - w/Differential (85025-CBCD) TLB-Hepatic/Liver Function Pnl (80076-HEPATIC) TLB-Amylase (82150-AMYL) TLB-Lipase (83690-LIPASE) TLB-H. Pylori Abs(Helicobacter Pylori) (86677-HELICO) Specimen Handling (29562) Radiology Referral (Radiology) UA Dipstick w/o Micro (manual) (13086)  Discussed use of medications, application of heat or cold, and exercises.   Problem # 2:  HEMATURIA, HX OF (ICD-V13.09) consider ct urogram but pt had severe RUQ tenderness with palp---  May need to look for kidney stone---If pain inc go to ER Orders: T-Culture, Urine ([id number]) UA Dipstick w/o Micro (manual) (28413)  Complete Medication List: 1)  Synthroid 112 Mcg Tabs (Levothyroxine sodium) .Marland Kitchen.. 1 by mouth tues, thurs , fri, sat , sun 2)  Pristiq 50 Mg Xr24h-tab (Desvenlafaxine succinate) .Marland Kitchen.. 1 by mouth once daily 3)  Clobetasol Propionate 0.05 % Crea (Clobetasol propionate) .... Apply as directed 4)  Potassium Gluconate 595 Mg Tabs (Potassium gluconate) .... Take 1 tablet by mouth once a day 5)  Metrogel 1 % Gel (Metronidazole) .... Apply daily for rosacea 6)  Alprazolam 0.25 Mg Tabs (Alprazolam) .Marland Kitchen.. 1 by mouth three times a day as needed 7)  Lipitor 20 Mg Tabs (Atorvastatin calcium) .Marland Kitchen.. 1 by mouth at bedtime 8)  Synthroid 125 Mcg Tabs (Levothyroxine sodium) .Marland Kitchen.. 1 by mouth mw 9)  Vicodin Es 7.5-750 Mg Tabs (Hydrocodone-acetaminophen) .Marland Kitchen.. 1 by mouth every 6 hours as needed  Laboratory Results   Urine Tests    Routine Urinalysis   Color: yellow Appearance: Clear Glucose: negative   (Normal Range: Negative) Bilirubin: negative   (Normal Range:  Negative) Ketone: trace (5)   (Normal Range: Negative) Spec. Gravity: 1.020   (Normal Range: 1.003-1.035) Blood: trace-lysed   (Normal Range: Negative) pH: 5.0   (Normal Range: 5.0-8.0) Protein: trace   (Normal Range: Negative) Urobilinogen: 0.2   (Normal Range: 0-1) Nitrite: negative   (Normal Range: Negative) Leukocyte Esterace: negative   (Normal Range: Negative)    Urine HCG: negative

## 2010-09-24 NOTE — Letter (Signed)
Summary: Results Follow up Letter  [facility] [place]  [address] [place], [place] [address]   Phone: [phone]  Fax: [phone]    03/01/2007 MRN: [id number]   IDX  Katie [name] [address] [place], [place]  [address]  Dear [name],  The following are the results of your recent test(s):  Test         Result    Pap Smear:        Normal ___X__  Not Normal _____ Comments: ______________________________________________________ Cholesterol: LDL(Bad cholesterol):         Your goal is less than:         HDL (Good cholesterol):       Your goal is more than: Comments:  ______________________________________________________ Mammogram:        Normal _____  Not Normal _____ Comments:  ___________________________________________________________________ Hemoccult:        Normal _____  Not normal _______ Comments:    _____________________________________________________________________ Other Tests:    We routinely do not discuss normal results over the telephone.  If you desire a copy of the results, or you have any questions about this information we can discuss them at your next office visit.   Sincerely,

## 2010-09-24 NOTE — Progress Notes (Signed)
Summary: PT HAS A RASH AND WANTS TO BE SEEN TODAY  Phone Note Call from Patient Call back at Work Phone [phone]   Caller: Patient Reason for Call: Acute Illness, Talk to Nurse Summary of Call: DR LOWNE// PT WANTS TO BE SEEN TODAY FOR A RASH BETWEEN BREAST AND BACK PT SAYS SHE IS REALLY ITCHY PT HAS HAD THE RASH FOR A WEEK NOW. PT WAS OFFERED A APP FOR WED BUT PT SAID IF SHE DOES NOT GET SEEN TODAY SHE WILL GO TO A UC. Initial call taken by: Job Founds,  July 12, 2007 1:10 PM  Follow-up for Phone Call        spoke with pt ov sched today Follow-up by: Kandice Hams,  July 12, 2007 2:27 PM

## 2010-09-24 NOTE — Letter (Signed)
Summary: Results Follow-up Letter  [place] [facility]  [address] [place], [place] [address]   Phone: [phone]  Fax: [phone]    06/26/2008        [facility]in[facility] A. Brunetti [address] [place], [place]  [address]  Dear Ms. BOAK,   The following are the results of your recent test(s):  Test     Result     Pap Smear    Normal_______  Not Normal_____       Comments: _________________________________________________________ Cholesterol LDL(Bad cholesterol):          Your goal is less than:         HDL (Good cholesterol):        Your goal is more than: _________________________________________________________ Other Tests:   _________________________________________________________  Please call for an appointment Or ___Please see attached labwork.______________________________________________________ _________________________________________________________ _________________________________________________________  Sincerely,  Ardyth Man  at [facility]

## 2010-09-24 NOTE — Assessment & Plan Note (Signed)
Summary: acute pain in her shoulder//ph   Vital Signs:  Patient Profile:   45 Years Old Female Height:     63 inches Weight:      175.4 pounds Temp:     98.3 degrees F oral Pulse rate:   66 / minute BP sitting:   100 / 70  (left arm)  Pt. in pain?   no  Vitals Entered By: Jeremy Johann CMA (September 21, 2008 1:46 PM)                  PCP:  Laury Axon  Chief Complaint:  chest Pain no better and unable to take samples zegrid.  History of Present Illness: Pt here still c/o chest pain---zegerid made her sick but now pain radiates to R shoulder. No other complaints.    Current Allergies (reviewed today): ! 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)       CRYOTHERAPY, HX OF,  ABLATION (ICD-V15.9)       PREGNANCY, ECTOPIC NEC W/INTRAUTERINE PRG (ICD-633.81)       RESTLESS LEG SYNDROME, HX OF (ICD-V12.49)       HYPOTHYROIDISM (ICD-244.9)       HEADACHE (ICD-784.0)       plantar fascitis---Dr Almedia Balls       Chest Pain:                  Past Surgical History:    Reviewed history from 06/20/2008 and no changes required:       Tubal ligation (04/1999)       Carpal tunnel release  B/l--   1996, 2003       septoplasty 01/2004       cryoablation 11/04          Family History:    Reviewed history from 06/20/2008 and no changes required:       F- Suicide, depression       M- brain aneurysm       MGM- DM, alz. dementia, uterine CA, HTN, osteoporosis, OA, deceased at 45yo       PGM-- intestinal CA ?       PGF- Cancer?  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  Quit in 1997.  Smoked 1 ppd x 14 years.       Alcohol use-no       Drug use-no       Regular exercise-yes   Risk Factors: Tobacco use:  never Passive smoke exposure:  no Drug use:  no HIV high-risk behavior:  no Caffeine use:  2 drinks per day Alcohol use:  no Exercise:  yes    Times per week:  3  Type:  walk Seatbelt use:  100 % Sun Exposure:  rarely  Family History Risk Factors:    Family History of MI in females < 42 years old:  no    Family History of MI in males < 80 years old:  no   Review of Systems      See HPI   Physical Exam  General:     Well-developed,well-nourished,in no acute distress; alert,appropriate and cooperative throughout examination Abdomen:     + Ruq tenderness soft, no distention, no masses, and no guarding.   Extremities:     No clubbing, cyanosis, edema, or deformity noted with normal full range of motion of all joints.   Neurologic:     No cranial nerve deficits noted. Station and gait are normal. Plantar reflexes are down-going bilaterally. DTRs are symmetrical throughout. Sensory, motor and coordinative functions appear intact. Skin:     Intact without suspicious lesions or rashes    Impression & Recommendations:  Problem # 1:  ABDOMINAL PAIN OTHER SPECIFIED SITE (ICD-789.09)  Orders: Venipuncture (66440) TLB-TSH (Thyroid Stimulating Hormone) (84443-TSH) TLB-BMP (Basic Metabolic Panel-BMET) (80048-METABOL) TLB-Hepatic/Liver Function Pnl (80076-HEPATIC) Radiology Referral (Radiology) Discussed use of medications, application of heat or cold, and exercises.   Complete Medication List: 1)  Levothyroxine Sodium 112 Mcg Tabs (Levothyroxine sodium) .... Take 1 tablet by mouth every other day 2)  Fluoxetine Hcl 10 Mg Caps (Fluoxetine hcl) .Marland Kitchen.. 1 daily 3)  Claritin 10 Mg Tabs (Loratadine) .Marland Kitchen.. 1 once daily 4)   Clobetasol Propionate 0.05 % Crea (Clobetasol propionate) .... Apply as directed 5)  Synthroid 125 Mcg Tabs (Levothyroxine sodium) .... Take 1 tab once daily

## 2010-09-24 NOTE — Assessment & Plan Note (Signed)
Summary: cpx--PH   Vital Signs:  Patient Profile:   45 Years Old Female Weight:      179.13 pounds Temp:     98.9 degrees F oral Pulse rate:   65 / minute Resp:     16 per minute BP sitting:   130 / 78  (left arm)  Pt. in pain?   no  Vitals Entered By: Ardyth Man (June 20, 2008 8:36 AM)                  PCP:  Laury Axon  Chief Complaint:  CPX and No Pap.  History of Present Illness: Pt here for Cpe with no pap--- pt is on period and will return for pap.   No complaints. Pt sees Dr Talmage Nap for thyroid.      Current Allergies: ! MORPHINE  Past Medical History:    Reviewed history from 02/22/2007 and no changes required:       Headache       Hypothyroidism       Current Problems:        DERMATITIS (ICD-692.9)       CONTACT DERMATITIS, RHUS (ICD-692.6)       MENORRHAGIA (ICD-626.2)       PREVENTIVE HEALTH CARE (ICD-V70.0)       CARPAL TUNNEL RELEASE, LEFT, HX OF (ICD-V45.89)       CARPAL TUNNEL RELEASE, RIGHT, HX OF (ICD-V45.89)       DEVIATED NASAL SEPTUM (ICD-470)       CRYOTHERAPY, HX OF,  ABLATION (ICD-V15.9)       PREGNANCY, ECTOPIC NEC W/INTRAUTERINE PRG (ICD-633.81)       RESTLESS LEG SYNDROME, HX OF (ICD-V12.49)       HYPOTHYROIDISM (ICD-244.9)       HEADACHE (ICD-784.0)       plantar fascitis---Dr Almedia Balls                Past Surgical History:    Reviewed history from 02/22/2007 and no changes required:       Tubal ligation (04/1999)       Carpal tunnel release  B/l--   1996, 2003       septoplasty 01/2004       cryoablation 11/04          Family History:    Reviewed history from 02/22/2007 and no changes required:       F- Suicide, depression       M- brain aneurysm       MGM- DM, alz. dementia, uterine CA, HTN, osteoporosis, OA, deceased at 45yo       PGM-- intestinal CA ?       PGF- Cancer?  Social History:    Reviewed history from 02/22/2007 and no changes required:       Occupation: Systems developer [employer]       Married       Never  Smoked       Alcohol use-no       Drug use-no       Regular exercise-yes    Review of Systems      See HPI  General      Complains of fatigue.      Denies chills, fever, loss of appetite, malaise, sleep disorder, sweats, weakness, and weight loss.  Eyes      Denies blurring, discharge, double vision, eye irritation, eye pain, halos, itching, light sensitivity, red eye, vision loss-1 eye, and vision loss-both eyes.      optho--q2y  ENT      Denies decreased hearing, difficulty swallowing, ear discharge, earache, hoarseness, nasal congestion, nosebleeds, postnasal drainage, ringing in ears, sinus pressure, and sore throat.      dentist q6  CV      Denies bluish discoloration of lips or nails, chest pain or discomfort, difficulty breathing at night, difficulty breathing while lying down, fainting, fatigue, leg cramps with exertion, lightheadness, near fainting, palpitations, shortness of breath with exertion, swelling of feet, swelling of hands, and weight gain.  Resp      Denies chest discomfort, chest pain with inspiration, cough, coughing up blood, excessive snoring, hypersomnolence, morning headaches, pleuritic, shortness of breath, sputum productive, and wheezing.  GI      Denies abdominal pain, bloody stools, change in bowel habits, constipation, dark tarry stools, diarrhea, excessive appetite, gas, hemorrhoids, indigestion, loss of appetite, nausea, vomiting, vomiting blood, and yellowish skin color.  GU      Denies abnormal vaginal bleeding, decreased libido, discharge, dysuria, genital sores, hematuria, incontinence, nocturia, urinary frequency, and urinary hesitancy.      dr Ruthine Dose with cautery in Dec-- pt comes here for gyn exams  MS      Denies joint pain, joint redness, joint swelling, loss of strength, low back pain, mid back pain, muscle aches, muscle , cramps, muscle weakness, stiffness, and thoracic pain.  Derm      Denies changes in color of skin,  changes in nail beds, dryness, excessive perspiration, flushing, hair loss, insect bite(s), itching, lesion(s), poor wound healing, and rash.  Neuro      Denies brief paralysis, difficulty with concentration, disturbances in coordination, falling down, headaches, inability to speak, memory loss, numbness, poor balance, seizures, sensation of room spinning, tingling, tremors, visual disturbances, and weakness.  Psych      Denies alternate hallucination ( auditory/visual), anxiety, depression, easily angered, easily tearful, irritability, mental problems, panic attacks, sense of great danger, suicidal thoughts/plans, thoughts of violence, unusual visions or sounds, and thoughts /plans of harming others.  Endo      Denies cold intolerance, excessive hunger, excessive thirst, excessive urination, heat intolerance, polyuria, and weight change.  Heme      Denies abnormal bruising, bleeding, enlarge lymph nodes, fevers, pallor, and skin discoloration.  Allergy      Denies hives or rash, itching eyes, persistent infections, seasonal allergies, and sneezing.   Physical Exam  General:     Well-developed,well-nourished,in no acute distress; alert,appropriate and cooperative throughout examination Head:     Normocephalic and atraumatic without obvious abnormalities. No apparent alopecia or balding. Eyes:     vision grossly intact, pupils equal, pupils round, and pupils reactive to light.   Ears:     External ear exam shows no significant lesions or deformities.  Otoscopic examination reveals clear canals, tympanic membranes are intact bilaterally without bulging, retraction, inflammation or discharge. Hearing is grossly normal bilaterally. Nose:     External nasal examination shows no deformity or inflammation. Nasal mucosa are pink and moist without lesions or exudates. Mouth:     Oral mucosa and oropharynx without lesions or exudates.  Teeth in good repair. Neck:     No deformities, masses, or  tenderness noted. Chest Wall:     No deformities, masses, or tenderness noted. Breasts:     No mass, nodules, thickening, tenderness, bulging, retraction, inflamation, nipple discharge or skin changes noted.   Lungs:     Normal respiratory effort, chest expands symmetrically. Lungs are clear to auscultation, no crackles or wheezes. Heart:  normal rate, regular rhythm, and no murmur.   Abdomen:     Bowel sounds positive,abdomen soft and non-tender without masses, organomegaly or hernias noted. Genitalia:     deferred until after period done Msk:     normal ROM, no joint tenderness, no joint swelling, no joint warmth, no redness over joints, no joint deformities, no joint instability, and no crepitation.   Pulses:     R posterior tibial normal, R dorsalis pedis normal, R carotid normal, L posterior tibial normal, L dorsalis pedis normal, and L carotid normal.   Extremities:     No clubbing, cyanosis, edema, or deformity noted with normal full range of motion of all joints.   Neurologic:     No cranial nerve deficits noted. Station and gait are normal. Plantar reflexes are down-going bilaterally. DTRs are symmetrical throughout. Sensory, motor and coordinative functions appear intact. Skin:     Intact without suspicious lesions or rashes Cervical Nodes:     No lymphadenopathy noted Axillary Nodes:     No palpable lymphadenopathy Psych:     Cognition and judgment appear intact. Alert and cooperative with normal attention span and concentration. No apparent delusions, illusions, hallucinations    Impression & Recommendations:  Problem # 1:  PREVENTIVE HEALTH CARE (ICD-V70.0) ghm utd rto for pap Orders: EKG w/ Interpretation (93000) Venipuncture (04540) TLB-Lipid Panel (80061-LIPID) TLB-BMP (Basic Metabolic Panel-BMET) (80048-METABOL) TLB-CBC Platelet - w/Differential (85025-CBCD) TLB-Hepatic/Liver Function Pnl (80076-HEPATIC)   Problem # 2:  PLANTAR FASCIITIS, BILATERAL  (ICD-728.71) per podiatry  Problem # 3:  SNORING (ICD-786.09)  Orders: Sleep Disorder Referral (Sleep Disorder) Recommended fluid and salt restriction.   Problem # 4:  HYPOTHYROIDISM (ICD-244.9) per endo Her updated medication list for this problem includes:    Levothyroxine Sodium 112 Mcg Tabs (Levothyroxine sodium) .Marland Kitchen... Take 1 tablet by mouth every other day  Labs Reviewed: TSH: 11.27 (02/22/2007)    Chol: 249 (02/22/2007)   HDL: 48.2 (02/22/2007)   LDL: DEL (02/22/2007)   TG: 117 (02/22/2007)   Complete Medication List: 1)  Levothyroxine Sodium 112 Mcg Tabs (Levothyroxine sodium) .... Take 1 tablet by mouth every other day 2)  Fluoxetine Hcl 10 Mg Caps (Fluoxetine hcl) .Marland Kitchen.. 1 daily 3)  Clarinex 5 Mg Tabs (Desloratadine) .... Takeone tablet daily 4)  Clobetasol Propionate 0.05 % Crea (Clobetasol propionate) .... Apply as directed  Other Orders: Tdap => 56yrs IM (98119) Admin 1st Vaccine (14782)    ]  EKG  Procedure date:  06/20/2008  Findings:      sinus rhythm 69 bpm    EKG  Procedure date:  06/20/2008  Findings:      sinus rhythm 69 bpm     Tetanus/Td Vaccine    Vaccine Type: Tdap    Site: right deltoid    Mfr: GlaxoSmithKline    Dose: 0.5 ml    Route: IM    Given by: Ardyth Man    Exp. Date: 07/29/2010    Lot #: [id number]    VIS given: 07/13/07 version given June 20, 2008.   Appended Document: cpx--PH  Laboratory Results   Urine Tests   Date/Time Reported: June 20, 2008 10:34 AM   Routine Urinalysis   Color: yellow Appearance: Hazy Glucose: negative   (Normal Range: Negative) Bilirubin: negative   (Normal Range: Negative) Ketone: negative   (Normal Range: Negative) Spec. Gravity: 1.025   (Normal Range: 1.003-1.035) Blood: large   (Normal Range: Negative) pH: 5.0   (Normal Range: 5.0-8.0) Protein: negative   (Normal  Range: Negative) Urobilinogen: negative   (Normal Range: 0-1) Nitrite: negative   (Normal Range:  Negative) Leukocyte Esterace: negative   (Normal Range: Negative)    Comments: Floydene Flock CMA  June 20, 2008 10:35 AM PT W/ MENSES

## 2010-09-24 NOTE — Progress Notes (Signed)
Summary: Lab Results (lmom 3/16)  Phone Note Outgoing Call   Call placed by: Army Fossa CMA,  November 07, 2009 9:43 AM Reason for Call: Discuss lab or test results Summary of Call: Regarding lab results, LMTCB:  Hyperthyroid--- synthroid  #30  1 by mouth once daily ,  2 refills  TG elevated--- watch simple sugars, starches,  white flour------recheck labs in 3 months---NMR, hep, bmp,  TSH  244.9  272.4 Signed by Loreen Freud DO on 11/06/2009 at 8:23 PM   Follow-up for Phone Call        Pt is aware. Army Fossa CMA  November 07, 2009 10:26 AM     New/Updated Medications: SYNTHROID 112 MCG TABS (LEVOTHYROXINE SODIUM) 1 by mouth daily. Prescriptions: SYNTHROID 112 MCG TABS (LEVOTHYROXINE SODIUM) 1 by mouth daily.  #30 x 2   Entered by:   Army Fossa CMA   Authorized by:   Loreen Freud DO   Signed by:   Army Fossa CMA on 11/07/2009   Method used:   Electronically to        [facility] (retail)       [address]       [place], [place]  [address]       Ph: [phone]       Fax: [phone]   RxID:   [id number]

## 2010-09-24 NOTE — Assessment & Plan Note (Signed)
Summary: snoring & chronic insomnia/apc   Copy to:  Loreen Freud Primary Provider/Referring Provider:  Loreen Freud DO  CC:  Sleep Consult.  History of Present Illness: 45/F for evaluation of insomnia of sleep maintenance. Her problem seems to be frequent transient arousals leading to excessive daytime fatigue. She has this sleep pattern evr since she can remember. She has symptoms consistent with restlesslegs syndrome - klonopin helped but stopped due to side effects. Tried homeopathic medication for some time. Husband has noted loud snoring over last 5 years, not positional. She has undergone septoplasty, nasal steroids did not help. Palatal injection did not solve this problem. Epworth Sleepiness Score is 5/24, denies excessive daytime somnolence . Naps are non refreshing. Bedtime is 9pm, sleep latency 15 mins, wakes up several times but rarely stays up, vivid dreams, wakes up at 5 am tired, no headaches, 1 cup coffee /day. Recent holter for palpitations was non diagnostic There is no history suggestive of cataplexy, sleep paralysis or parasomnias  She takes pristiq for anxiety (used to be on prozac) , has not needed alprazolam  Pulmonary Sleep Consultation Name Katie [name] DOB [birth year] MRN [id number] Date 12/12/2009  History of Present Illness: Go to sleep ok. Unable to stay asleep.  What time do you typically go to bed?(between what hours): 9pm/10pm  How long does it take you to fall asleep? 15 mins.  How many times during the night do you wake up? several (never counted)  What time do you get out of bed to start your day? 5am  Do you drive or operate heavy machinery in your occupation? no  How much has your weight changed (up or down) over the past two years? (in pounds): 5 lbs  Have you ever had a sleep study before?  If yes,when and where: no  Do you currently use CPAP ? If so , at what pressure? no  Do you wear oxygen at any time? If yes, how many liters per  minute? no Medications Prior to Update: 1)  Synthroid 112 Mcg Tabs (Levothyroxine Sodium) .Marland Kitchen.. 1 By Mouth Daily. 2)  Pristiq 50 Mg Xr24h-Tab (Desvenlafaxine Succinate) .Marland Kitchen.. 1 By Mouth Once Daily 3)  Clobetasol Propionate 0.05 % Crea (Clobetasol Propionate) .... Apply As Directed 4)  Potassium Gluconate 595 Mg Tabs (Potassium Gluconate) .... Take 1 Tablet By Mouth Once A Day 5)  Metrogel 1 % Gel (Metronidazole) .... Apply Daily For Rosacea 6)  Alprazolam 0.25 Mg Tabs (Alprazolam) .Marland Kitchen.. 1 By Mouth Three Times A Day As Needed 7)  Pravachol 40 Mg Tabs (Pravastatin Sodium) .... Take One Tablet Each Evening At Bedtime  Allergies (verified): 1)  ! Morphine  Past History:  Past Medical History: Reviewed history from 01/19/2009 and no changes required. 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 Tubal ligation (04/1999) Carpal tunnel release  B/l--   1996, 2003 septoplasty 01/2004 cryoablation 11/04 ectopic pregnancy 1988 bunion and plantar facsitis-- R foot-- 06/04/2009  Family History: F- Suicide, depression M- brain aneurysm MGM- DM, alz. dementia, uterine CA, HTN, osteoporosis, OA, deceased at 45yo PGM-- intestinal CA ?  PGF- Cancer? 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  Quit in 1997.  started at age 28.  1 ppd. Alcohol use-no Drug use-no Regular exercise-yes   Review of Systems       The patient complains of irregular heartbeats.  The patient denies shortness of breath with activity, shortness of breath at rest, productive cough, non-productive cough, coughing up blood, chest pain, acid heartburn, indigestion, loss of appetite, weight change, abdominal pain, difficulty swallowing, sore throat, tooth/dental problems, headaches, nasal congestion/difficulty breathing through nose, sneezing, itching, ear ache, anxiety, depression, hand/feet swelling, joint stiffness or pain, rash, change in color of mucus, and fever.    Vital Signs:  Patient profile:   45 year old female Height:      63 inches Weight:      173.50 pounds BMI:     30.85 O2 Sat:      97 % on Room air Temp:     98.2 degrees F oral Pulse rate:   82 / minute BP sitting:   118 / 76  (left arm) Cuff size:   regular  Vitals Entered By: Arman Filter LPN (December 12, 2009 3:56 PM)  O2 Flow:  Room air CC: Sleep Consult Comments Medications reviewed with patient Arman Filter LPN  December 12, 2009 3:56 PM    Physical Exam  Additional Exam:  Gen. Pleasant, well-nourished, in no distress, normal affect ENT - no lesions, no post nasal drip, class 2 airway Neck: No JVD, no thyromegaly, no carotid bruits Lungs: no use of accessory muscles, no dullness to percussion, clear without rales or rhonchi  Cardiovascular: Rhythm regular, heart sounds  normal, no murmurs or gallops, no peripheral edema Abdomen: soft and non-tender, no hepatosplenomegaly, BS normal. Musculoskeletal: No deformities, no cyanosis or clubbing Neuro:  alert, non focal     Impression & Recommendations:  Problem # 1:  RESTLESS LEG SYNDROME (ICD-333.99)  Unclear from history whether frequent awakenings are related to PLMs during sleep - or whether there is a component of obstructive  sleep apnea. Doubt portable study will clarify this question. The pathophysiology of obstructive sleep apnea, it's cardiovascular consequences and modes of treatment including CPAP were discussed with the patient in great detail.  Proceedw ith in lab study. If sleep disordered breathing is not significant, will focus on restless legs treatment.  Orders: Consultation Level III [phone])  Other Orders: Sleep Disorder Referral (Sleep Disorder)  Patient Instructions: 1)  Copy sent to: Dr Laury Axon 2)  Please schedule a follow-up appointment in 2 weeks after sleep study

## 2010-09-24 NOTE — Letter (Signed)
Summary: Results Follow-up Letter  [facility]  [address] [place], [place] [address]   Phone: [phone]  Fax: [phone]    08/14/2008         Katie [name] [address] [place], [place]  [address]  Dear [name],   The following are the results of your recent test(s):  Test     Result     Pap Smear    Normal_______  Not Normal_____       Comments: _________________________________________________________ Cholesterol LDL(Bad cholesterol):          Your goal is less than:         HDL (Good cholesterol):        Your goal is more than: _________________________________________________________ Other Tests:   _________________________________________________________  Please call for an appointment Or Please see attached labs._________________________________________________________ _________________________________________________________ _________________________________________________________  Sincerely,  Felecia Deloach CMA [facility]

## 2010-09-24 NOTE — Progress Notes (Signed)
Summary: Manometry test results   Phone Note Call from Patient Call back at Home Phone [phone]   Call For: DR Elyce Zollinger Reason for Call: Talk to Nurse Summary of Call: Manometry test results Initial call taken by: Leanor Kail [facility],  Jan 10, 2009 3:51 PM  Follow-up for Phone Call        Advised patient that results not yet available, usually takes about 10 days to 2 weeks to get full results Follow-up by: Darcey Nora RN,  Jan 11, 2009 8:27 AM      Appended Document: Manometry test results please call her, the esophageal manometry was normal.   she should have rov with me in next few weeks to discuss other tests, workup if she wants  Appended Document: Manometry test results pt states she does not want any further appt's or  tests she will f/u with her PCP  Appended Document: Manometry test results ok

## 2010-09-24 NOTE — Letter (Signed)
Summary: Primary Care Consult Scheduled Letter  [facility]  [address] [place], [place] [address]   Phone: [phone]  Fax: [phone]      06/20/2008 MRN: [id number]  Myliyah Smead [address] [place], [place]  [address]    Dear Ms. Renato Gails,      We have scheduled an appointment for you.  At the recommendation of Dr.Lowne, we have scheduled you a consult with Dr. Vassie Loll at [facility] on November 9th at 2:00pm.  Their address is [address] [place]. The office phone number is [phone].  If this appointment day and time is not convenient for you, please feel free to call the office of the doctor you are being referred to at the number listed above and reschedule the appointment.     It is important for you to keep your scheduled appointments. We are here to make sure you are given good patient care. If you have questions or you have made changes to your appointment, please notify us at  [phone], ask for Tiffany.    Thank you,  Patient Care Coordinator [place] [facility]

## 2010-09-24 NOTE — Letter (Signed)
Summary: Results Follow up Letter  [facility] [place]  [address] [place], [place] [address]   Phone: [phone]  Fax: [phone]    10/31/2009 MRN: [id number]  Naylene Hausler [address] [place], [place]  [address]  Dear Ms. NEWMAN,  The following are the results of your recent test(s):  Test         Result    Pap Smear:        Normal __X___  Not Normal _____ Comments: ______________________________________________________ Cholesterol: LDL(Bad cholesterol):         Your goal is less than:         HDL (Good cholesterol):       Your goal is more than: Comments:  ______________________________________________________ Mammogram:        Normal _____  Not Normal _____ Comments:  ___________________________________________________________________ Hemoccult:        Normal _____  Not normal _______ Comments:    _____________________________________________________________________ Other Tests:    We routinely do not discuss normal results over the telephone.  If you desire a copy of the results, or you have any questions about this information we can discuss them at your next office visit.   Sincerely,    Army Fossa CMA  October 31, 2009 7:59 AM

## 2010-09-24 NOTE — Progress Notes (Signed)
Summary: cx test  Phone Note Call from Patient Call back at Home Phone [phone]   Caller: Patient Call For: JACOBS  Reason for Call: Talk to Nurse Details for Reason: CX Summary of Call: would like to Cx Esophageal manommetry sch on 4/19. pt prefers to see Dr first has made appt for tomorrow Initial call taken by: Guadlupe Spanish [facility],  November 06, 2008 2:09 PM  Follow-up for Phone Call        Advised pt. discuss at o.v. tomorrow before cancelling manometry Follow-up by: Teryl Lucy RN,  November 06, 2008 3:31 PM

## 2010-09-24 NOTE — Assessment & Plan Note (Signed)
Summary: acute for chest pain   Vital Signs:  Patient Profile:   45 Years Old Female Height:     63 inches Weight:      178.2 pounds Temp:     98.2 degrees F oral Pulse rate:   66 / minute BP sitting:   100 / 80  (left arm)  Pt. in pain?   no  Vitals Entered By: Jeremy Johann CMA (July 19, 2008 10:37 AM)                  PCP:  Laury Axon  Chief Complaint:  Chest Pain.  History of Present Illness: Pt here c/o sharp chest pain L side of chest and palpatations.  No radiation of pain.  Thyroid was just check with Dr Talmage Nap in august .  Palpations are the same but chest pain is new.  + SOB on occasion.     No indigestion.      Current Allergies (reviewed today): ! MORPHINE  Past Medical History:    Reviewed history from 06/20/2008 and no changes required:       Headache       Hypothyroidism       Current Problems:        DERMATITIS (ICD-692.9)       CONTACT DERMATITIS, RHUS (ICD-692.6)       MENORRHAGIA (ICD-626.2)       PREVENTIVE HEALTH CARE (ICD-V70.0)       CARPAL TUNNEL RELEASE, LEFT, HX OF (ICD-V45.89)       CARPAL TUNNEL RELEASE, RIGHT, HX OF (ICD-V45.89)       DEVIATED NASAL SEPTUM (ICD-470)       CRYOTHERAPY, HX OF,  ABLATION (ICD-V15.9)       PREGNANCY, ECTOPIC NEC W/INTRAUTERINE PRG (ICD-633.81)       RESTLESS LEG SYNDROME, HX OF (ICD-V12.49)       HYPOTHYROIDISM (ICD-244.9)       HEADACHE (ICD-784.0)       plantar fascitis---Dr Almedia Balls                 Family History:    Reviewed history from 06/20/2008 and no changes required:       F- Suicide, depression       M- brain aneurysm       MGM- DM, alz. dementia, uterine CA, HTN, osteoporosis, OA, deceased at 45yo       PGM-- intestinal CA ?       PGF- Cancer?  Social History:    Reviewed history from 07/03/2008 and no changes required:       Occupation: Systems developer [employer]       Married       children       Former smoker.  Quit in 1997.  Smoked 1 ppd x 14 years.       Alcohol use-no  Drug use-no       Regular exercise-yes   Risk Factors: Tobacco use:  never Passive smoke exposure:  no Drug use:  no HIV high-risk behavior:  no Caffeine use:  2 drinks per day Alcohol use:  no Exercise:  yes    Times per week:  3    Type:  walk Seatbelt use:  100 % Sun Exposure:  rarely  Family History Risk Factors:    Family History of MI in females < 66 years old:  no    Family History of MI in males < 46 years old:  no   Review of Systems  See HPI   Physical Exam  General:     Well-developed,well-nourished,in no acute distress; alert,appropriate and cooperative throughout examination Ears:     External ear exam shows no significant lesions or deformities.  Otoscopic examination reveals clear canals, tympanic membranes are intact bilaterally without bulging, retraction, inflammation or discharge. Hearing is grossly normal bilaterally. Nose:     External nasal examination shows no deformity or inflammation. Nasal mucosa are pink and moist without lesions or exudates. Mouth:     Oral mucosa and oropharynx without lesions or exudates.  Teeth in good repair. Neck:     No deformities, masses, or tenderness noted. Lungs:     Normal respiratory effort, chest expands symmetrically. Lungs are clear to auscultation, no crackles or wheezes. Heart:     Normal rate and regular rhythm. S1 and S2 normal without gallop, murmur, click, rub or other extra sounds. Abdomen:     Bowel sounds positive,abdomen soft and non-tender without masses, organomegaly or hernias noted. Skin:     Intact without suspicious lesions or rashes Cervical Nodes:     No lymphadenopathy noted Psych:     Oriented X3, memory intact for recent and remote, normally interactive, and slightly anxious.      Impression & Recommendations:  Problem # 1:  CHEST PAIN UNSPECIFIED (ICD-786.50) Assessment: New check labs  if pain reoccurs go to ER zegerid once daily  Orders: Venipuncture (16109) TLB-BMP  (Basic Metabolic Panel-BMET) (80048-METABOL) TLB-CBC Platelet - w/Differential (85025-CBCD) TLB-Hepatic/Liver Function Pnl (80076-HEPATIC) TLB-TSH (Thyroid Stimulating Hormone) (84443-TSH) TLB-CK Total Only(Creatine Kinase/CPK) (82550-CK) TLB-CK-MB (Creatine Kinase MB) (82553-CKMB) T-D-Dimer Fibrin Derivatives Quantitive (60454-09811) Cardiology Referral (Cardiology) EKG w/ Interpretation (93000)   Complete Medication List: 1)  Levothyroxine Sodium 112 Mcg Tabs (Levothyroxine sodium) .... Take 1 tablet by mouth every other day 2)  Fluoxetine Hcl 10 Mg Caps (Fluoxetine hcl) .Marland Kitchen.. 1 daily 3)  Claritin 10 Mg Tabs (Loratadine) .Marland Kitchen.. 1 once daily 4)  Clobetasol Propionate 0.05 % Crea (Clobetasol propionate) .... Apply as directed    ]

## 2010-09-24 NOTE — Assessment & Plan Note (Signed)
  GI problem list: 1. chest pains, shoulder pains (usually radiating to right upper quadrant): ultrasound 08/2008 normal; cmet, cbc normal;  EGD /2010 normal.  Nexium for one month made no difference.  No SOB. Cardiology workup including stress testing all negative.    History of Present Illness Visit Type:  Follow-up Visit Primary GI MD:  Rob Bunting MD Primary MD:  Loreen Freud, MD Referral MD:  Loreen Freud, MD Chief Complaint:  chest pain non-cardiac History of Present Illness:    I last saw Katie [name] 2 weeks ago at the time of her EGD.  her chest pain discomforts are happening several times a day.  the pains are in her chest, upper right back and radiates around to her right upper quadrant.  Crushing type pains.  No fevers or chills.  Eatins causes some stomach upset but not assciated with the chest/shoulder pains.  Has not had a CXR with these symptoms.            Current Medications (verified): 1)  Levothyroxine Sodium 125 Mcg Tabs (Levothyroxine Sodium) .... Once Daily 2)  Fluoxetine Hcl 10 Mg  Caps (Fluoxetine Hcl) .... Take 1 Tablet By Mouth Once A Day 3)  Claritin 10 Mg Tabs (Loratadine) .... Take 1 Tablet By Mouth Once A Day As Needed 4)  Clobetasol Propionate 0.05 % Crea (Clobetasol Propionate) .... Apply As Directed 5)  Diclofenac Sodium 75 Mg Tbec (Diclofenac Sodium) .... Take 1 Tablet By Mouth Two Times A Day After Meals 6)  Potassium Gluconate 595 Mg Tabs (Potassium Gluconate) .... Take 1 Tablet By Mouth Once A Day  Allergies (verified): 1)  ! Morphine  Vital Signs:  Patient profile:   45 year old female Height:      63 inches Weight:      175 pounds Pulse rate:   60 / minute Pulse rhythm:   regular BP sitting:   106 / 78  (right arm)  Vitals Entered By: June McMurray CMA (November 07, 2008 2:05 PM)  Physical Exam  Additional Exam:  Constitutional: generally well appearing Psychiatric: alert and oriented times 3 Abdomen: soft, non-tender, non-distended,  normal bowel sounds No focal back pains or tenderness   Impression & Recommendations:  Problem # 1:  CHEST PAIN UNSPECIFIED (ICD-786.50) unclear etiology. EGD was normal. Ultrasound was normal. Complete metabolic profile including liver tests was normal. A trial of Nexium for one month did not help at all and she has no underlying classic GERD symptoms. It is not clear that her GI tract is causing these symptoms. We will arrange for her to have a HIDA scan performed to see if she has biliary dyskinesia. If this is unrevealing then she will go ahead with the previously scheduled esophageal manometry test look for diffuse esophageal spasms.  Patient Instructions: 1)  We will schedule a HIDA scan with CCK stimulation to check gallbladder funciton. 2)  If this is negative, will await esophageal manometry testing. 3)  A copy of this information will be sent to Dr. Laury Axon 4)  The medication list was reviewed and reconciled.  All changed / newly prescribed medications were explained.  A complete medication list was provided to the patient / caregiver.  Appended Document: Orders Update/HIDA    Clinical Lists Changes  Orders: Added new Referral order of HIDA CCK (HIDA CCK) - Signed

## 2010-09-24 NOTE — Assessment & Plan Note (Signed)
Summary: CPX//PH   Vital Signs:  Patient profile:   45 year old female Height:      63 inches Weight:      173 pounds Temp:     98.3 degrees F oral Pulse rate:   70 / minute Pulse rhythm:   regular BP sitting:   116 / 82  (left arm) Cuff size:   regular  Vitals Entered By: Army Fossa CMA (October 29, 2009 8:31 AM) CC: CPX, and PAP   History of Present Illness: Pt here for cpe , labs and pap.  Pt still having problems with sleep---+ snoring and waking up frequently.   Pt also still having palpatations.     Preventive Screening-Counseling & Management  Alcohol-Tobacco     Alcohol drinks/day: 0     Smoking Status: quit > 6 months     Packs/Day: 1.0     Pack years: 14     Passive Smoke Exposure: no  Caffeine-Diet-Exercise     Caffeine use/day: 1     Does Patient Exercise: no     Exercise Counseling: to improve exercise regimen  Hep-HIV-STD-Contraception     HIV Risk: no     Dental Visit-last 6 months yes     Dental Care Counseling: not indicated; dental care within six months     Sun Exposure-Excessive: rarely  Safety-Violence-Falls     Seat Belt Use: 100      Sexual History:  currently monogamous.    Current Medications (verified): 1)  Levothyroxine Sodium 125 Mcg Tabs (Levothyroxine Sodium) .... Once Daily 2)  Pristiq 50 Mg Xr24h-Tab (Desvenlafaxine Succinate) .Marland Kitchen.. 1 By Mouth Once Daily 3)  Clobetasol Propionate 0.05 % Crea (Clobetasol Propionate) .... Apply As Directed 4)  Potassium Gluconate 595 Mg Tabs (Potassium Gluconate) .... Take 1 Tablet By Mouth Once A Day 5)  Metrogel 1 % Gel (Metronidazole) .... Apply Daily For Rosacea 6)  Alprazolam 0.25 Mg Tabs (Alprazolam) .Marland Kitchen.. 1 By Mouth Three Times A Day As Needed 7)  Pravachol 40 Mg Tabs (Pravastatin Sodium) .... Take One Tablet Each Evening At Bedtime  Allergies: 1)  ! Morphine  Past History:  Past Medical History: Last updated: 01/19/2009 Headache Hypothyroidism Current Problems:  DERMATITIS  (ICD-692.9) CONTACT DERMATITIS, RHUS (ICD-692.6) MENORRHAGIA (ICD-626.2) PREVENTIVE HEALTH CARE (ICD-V70.0) CARPAL TUNNEL RELEASE, LEFT, HX OF (ICD-V45.89) CARPAL TUNNEL RELEASE, RIGHT, HX OF (ICD-V45.89) DEVIATED NASAL SEPTUM (ICD-470) CRYOTHERAPY, HX OF,  ABLATION (ICD-V15.9) PREGNANCY, ECTOPIC NEC W/INTRAUTERINE PRG (ICD-633.81) RESTLESS LEG SYNDROME, HX OF (ICD-V12.49) HYPOTHYROIDISM (ICD-244.9) HEADACHE (ICD-784.0) plantar fascitis---Dr Almedia Balls Chest Pain:    Hyperlipidemia  Family History: Last updated: 10/18/2008 F- Suicide, depression M- brain aneurysm MGM- DM, alz. dementia, uterine CA, HTN, osteoporosis, OA, deceased at 45yo PGM-- intestinal CA ? PGF- Cancer? 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  Quit in 1997.  Smoked 1 ppd x 14 years. Alcohol use-no Drug use-no Regular exercise-yes   Risk Factors: Alcohol Use: 0 (10/29/2009) Caffeine Use: 1 (10/29/2009) Exercise: no (10/29/2009)  Risk Factors: Smoking Status: quit > 6 months (10/29/2009) Packs/Day: 1.0 (10/29/2009) Passive Smoke Exposure: no (10/29/2009)  Past Surgical History: Tubal ligation (04/1999) Carpal tunnel release  B/l--   1996, 2003 septoplasty 01/2004 cryoablation 11/04 ectopic pregnancy 1988 bunion and plantar facsitis-- R foot-- 06/04/2009  Family History: Reviewed history from 10/18/2008 and no changes required. F- Suicide, depression M- brain aneurysm MGM- DM, alz. dementia, uterine CA, HTN, osteoporosis, OA, deceased  at 45yo PGM-- intestinal CA ? PGF- Cancer? grandmother had colon polyps No colon cancer  Social History: Reviewed history from 10/18/2008 and no changes required. 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  Quit in 1997.  Smoked 1 ppd x 14 years. Alcohol use-no Drug use-no Regular exercise-yes  Caffeine use/day:   1 Does Patient Exercise:  no Dental Care w/in 6 mos.:  yes Sexual History:  currently monogamous Smoking Status:  quit > 6 months Packs/Day:  1.0  Review of Systems      See HPI General:  Complains of sleep disorder; denies chills, fatigue, fever, loss of appetite, malaise, sweats, weakness, and weight loss. Eyes:  Denies blurring, discharge, double vision, eye irritation, eye pain, halos, itching, light sensitivity, red eye, vision loss-1 eye, and vision loss-both eyes; + optho- q1y. ENT:  Denies decreased hearing, difficulty swallowing, ear discharge, earache, hoarseness, nasal congestion, nosebleeds, postnasal drainage, ringing in ears, sinus pressure, and sore throat. CV:  Complains of palpitations; denies bluish discoloration of lips or nails, chest pain or discomfort, difficulty breathing at night, difficulty breathing while lying down, fainting, fatigue, leg cramps with exertion, lightheadness, near fainting, shortness of breath with exertion, swelling of feet, swelling of hands, and weight gain. Resp:  Denies chest discomfort, chest pain with inspiration, cough, coughing up blood, excessive snoring, hypersomnolence, morning headaches, pleuritic, shortness of breath, sputum productive, and wheezing. GI:  Denies abdominal pain, bloody stools, change in bowel habits, constipation, dark tarry stools, diarrhea, excessive appetite, gas, hemorrhoids, indigestion, loss of appetite, nausea, vomiting, vomiting blood, and yellowish skin color. GU:  Denies abnormal vaginal bleeding, decreased libido, discharge, dysuria, genital sores, hematuria, incontinence, nocturia, urinary frequency, and urinary hesitancy. MS:  Denies joint pain, joint redness, joint swelling, loss of strength, low back pain, mid back pain, muscle aches, muscle , cramps, muscle weakness, stiffness, and thoracic pain. Derm:  Denies changes in color of skin, changes in nail beds, dryness, excessive perspiration, flushing, hair loss,  insect bite(s), itching, lesion(s), poor wound healing, and rash. Neuro:  Denies brief paralysis, difficulty with concentration, disturbances in coordination, falling down, headaches, inability to speak, memory loss, numbness, poor balance, seizures, sensation of room spinning, tingling, tremors, visual disturbances, and weakness. Psych:  Complains of anxiety; denies alternate hallucination ( auditory/visual), depression, easily angered, easily tearful, irritability, mental problems, panic attacks, sense of great danger, suicidal thoughts/plans, thoughts of violence, unusual visions or sounds, and thoughts /plans of harming others. Endo:  Denies cold intolerance, excessive hunger, excessive thirst, excessive urination, heat intolerance, polyuria, and weight change. Heme:  Denies abnormal bruising, bleeding, enlarge lymph nodes, fevers, pallor, and skin discoloration. Allergy:  Denies hives or rash, itching eyes, persistent infections, seasonal allergies, and sneezing.  Physical Exam  General:  Well-developed,well-nourished,in no acute distress; alert,appropriate and cooperative throughout examination Head:  Normocephalic and atraumatic without obvious abnormalities. No apparent alopecia or balding. Eyes:  vision grossly intact, pupils equal, pupils round, pupils reactive to light, and no injection.   Ears:  External ear exam shows no significant lesions or deformities.  Otoscopic examination reveals clear canals, tympanic membranes are intact bilaterally without bulging, retraction, inflammation or discharge. Hearing is grossly normal bilaterally. Nose:  External nasal examination shows no deformity or inflammation. Nasal mucosa are pink and moist without lesions or exudates. Mouth:  Oral mucosa and oropharynx without lesions or exudates.  Teeth in good repair. Neck:  No deformities, masses, or tenderness noted. Chest Wall:  No deformities, masses, or tenderness noted. Breasts:  No mass,  nodules,  thickening, tenderness, bulging, retraction, inflamation, nipple discharge or skin changes noted.   Lungs:  Normal respiratory effort, chest expands symmetrically. Lungs are clear to auscultation, no crackles or wheezes. Heart:  normal rate and no murmur.   Abdomen:  Bowel sounds positive,abdomen soft and non-tender without masses, organomegaly or hernias noted. Rectal:  No external abnormalities noted. Normal sphincter tone. No rectal masses or tenderness. heme negative brown stool. Genitalia:  Pelvic Exam:        External: normal female genitalia without lesions or masses        Vagina: normal without lesions or masses        Cervix: normal without lesions or masses        Adnexa: normal bimanual exam without masses or fullness        Uterus: normal by palpation        Pap smear: performed Msk:  normal ROM, no joint tenderness, no joint swelling, no joint warmth, no redness over joints, no joint deformities, no joint instability, and no crepitation.   Pulses:  R posterior tibial normal, R dorsalis pedis normal, R carotid normal, L posterior tibial normal, L dorsalis pedis normal, and L carotid normal.   Extremities:  No clubbing, cyanosis, edema, or deformity noted with normal full range of motion of all joints.   Neurologic:  No cranial nerve deficits noted. Station and gait are normal. Plantar reflexes are down-going bilaterally. DTRs are symmetrical throughout. Sensory, motor and coordinative functions appear intact. Skin:  Intact without suspicious lesions or rashes + psoriasis patches on breasts Cervical Nodes:  No lymphadenopathy noted Axillary Nodes:  No palpable lymphadenopathy Psych:  Cognition and judgment appear intact. Alert and cooperative with normal attention span and concentration. No apparent delusions, illusions, hallucinations   Impression & Recommendations:  Problem # 1:  PREVENTIVE HEALTH CARE (ICD-V70.0)  Orders: Venipuncture (09604) TLB-Lipid Panel  (80061-LIPID) TLB-BMP (Basic Metabolic Panel-BMET) (80048-METABOL) TLB-CBC Platelet - w/Differential (85025-CBCD) TLB-Hepatic/Liver Function Pnl (80076-HEPATIC) TLB-TSH (Thyroid Stimulating Hormone) (84443-TSH) EKG w/ Interpretation (93000)  Problem # 2:  PALPITATIONS, OCCASIONAL (ICD-785.1)  Orders: Cardiology Referral (Cardiology) Venipuncture (54098) TLB-Lipid Panel (80061-LIPID) TLB-BMP (Basic Metabolic Panel-BMET) (80048-METABOL) TLB-CBC Platelet - w/Differential (85025-CBCD) TLB-Hepatic/Liver Function Pnl (80076-HEPATIC) TLB-TSH (Thyroid Stimulating Hormone) (84443-TSH) EKG w/ Interpretation (93000)  Avoid caffeine, chocolate, and stimulants. Stress reduction as well as medication options discussed.   Problem # 3:  OTHER ANXIETY STATES (ICD-300.09)  Her updated medication list for this problem includes:    Pristiq 50 Mg Xr24h-tab (Desvenlafaxine succinate) .Marland Kitchen... 1 by mouth once daily    Alprazolam 0.25 Mg Tabs (Alprazolam) .Marland Kitchen... 1 by mouth three times a day as needed  Orders: Venipuncture (11914) TLB-Lipid Panel (80061-LIPID) TLB-BMP (Basic Metabolic Panel-BMET) (80048-METABOL) TLB-CBC Platelet - w/Differential (85025-CBCD) TLB-Hepatic/Liver Function Pnl (80076-HEPATIC) TLB-TSH (Thyroid Stimulating Hormone) (84443-TSH)  Discussed medication use and relaxation techniques.   Problem # 4:  HYPERLIPIDEMIA (ICD-272.4)  Her updated medication list for this problem includes:    Pravachol 40 Mg Tabs (Pravastatin sodium) .Marland Kitchen... Take one tablet each evening at bedtime  Orders: Venipuncture (78295) TLB-Lipid Panel (80061-LIPID) TLB-BMP (Basic Metabolic Panel-BMET) (80048-METABOL) TLB-CBC Platelet - w/Differential (85025-CBCD) TLB-Hepatic/Liver Function Pnl (80076-HEPATIC) TLB-TSH (Thyroid Stimulating Hormone) (84443-TSH)  Labs Reviewed: SGOT: 24 (08/29/2009)   SGPT: 24 (08/29/2009)   HDL:42.3 (06/20/2008), 48.2 (02/22/2007)  LDL:DEL (06/20/2008), DEL (02/22/2007)   Chol:224 (06/20/2008), 249 (02/22/2007)  Trig:171 (06/20/2008), 117 (02/22/2007)  Problem # 5:  INSOMNIA, CHRONIC (ICD-307.42)  Orders: Sleep Disorder Referral (Sleep Disorder) EKG w/ Interpretation (93000)  Problem # 6:  SNORING (ICD-786.09)  Orders: Sleep Disorder Referral (Sleep Disorder) EKG w/ Interpretation (93000)  Problem # 7:  HYPOTHYROIDISM (ICD-244.9)  Her updated medication list for this problem includes:    Levothyroxine Sodium 125 Mcg Tabs (Levothyroxine sodium) ..... Once daily  Labs Reviewed: TSH: 0.63 (01/19/2009)    Chol: 224 (06/20/2008)   HDL: 42.3 (06/20/2008)   LDL: DEL (06/20/2008)   TG: 171 (06/20/2008)  Complete Medication List: 1)  Levothyroxine Sodium 125 Mcg Tabs (Levothyroxine sodium) .... Once daily 2)  Pristiq 50 Mg Xr24h-tab (Desvenlafaxine succinate) .Marland Kitchen.. 1 by mouth once daily 3)  Clobetasol Propionate 0.05 % Crea (Clobetasol propionate) .... Apply as directed 4)  Potassium Gluconate 595 Mg Tabs (Potassium gluconate) .... Take 1 tablet by mouth once a day 5)  Metrogel 1 % Gel (Metronidazole) .... Apply daily for rosacea 6)  Alprazolam 0.25 Mg Tabs (Alprazolam) .Marland Kitchen.. 1 by mouth three times a day as needed 7)  Pravachol 40 Mg Tabs (Pravastatin sodium) .... Take one tablet each evening at bedtime   EKG  Procedure date:  10/29/2009  Findings:      Normal sinus rhythm with rate of:  78 bpm     Immunization History:  Influenza Immunization History:    Influenza:  Fluvax 3+ (08/08/2009)

## 2010-09-24 NOTE — Progress Notes (Signed)
  Phone Note Outgoing Call Call back at [facility] Phone [phone]   Call placed by: Kandice Hams,  July 19, 2008 1:44 PM Call placed to: Patient Summary of Call: Patient aware that d-dimer normal per Dr. Laury Axon, no need for ct. Kandice Hams  July 19, 2008 1:45 PM

## 2010-09-24 NOTE — Assessment & Plan Note (Signed)
Summary: HEADACHE,NAUSEA,EAR ACHE,BACK ACHE--PH   Vital Signs:  Patient profile:   45 year old female Height:      63 inches Weight:      176 pounds Temp:     98.4 degrees F oral Pulse rate:   72 / minute BP sitting:   118 / 78  (left arm)  Vitals Entered By: Jeremy Johann CMA (March 28, 2010 8:20 AM) CC: headache, nausea, pain back with urination   History of Present Illness: Pt here c/o headache, earache and back pain with urinary frequency for "awhile".  Pt is also still having palpatations.  They improved with decreasing synthroid dose.  No otc meds.  Current Medications (verified): 1)  Synthroid 112 Mcg Tabs (Levothyroxine Sodium) .Marland Kitchen.. 1 By Mouth Tues, Thurs , Fri, Sat , Sun 2)  Pristiq 50 Mg Xr24h-Tab (Desvenlafaxine Succinate) .Marland Kitchen.. 1 By Mouth Once Daily 3)  Clobetasol Propionate 0.05 % Crea (Clobetasol Propionate) .... Apply As Directed 4)  Potassium Gluconate 595 Mg Tabs (Potassium Gluconate) .... Take 1 Tablet By Mouth Once A Day 5)  Metrogel 1 % Gel (Metronidazole) .... Apply Daily For Rosacea 6)  Alprazolam 0.25 Mg Tabs (Alprazolam) .Marland Kitchen.. 1 By Mouth Three Times A Day As Needed 7)  Lipitor 20 Mg Tabs (Atorvastatin Calcium) .Marland Kitchen.. 1 By Mouth At Bedtime 8)  Ceftin 500 Mg Tabs (Cefuroxime Axetil) .Marland Kitchen.. 1 By Mouth Two Times A Day 9)  Synthroid 125 Mcg Tabs (Levothyroxine Sodium) .Marland Kitchen.. 1 By Mouth Mw 10)  Vicodin Es 7.5-750 Mg Tabs (Hydrocodone-Acetaminophen) .Marland Kitchen.. 1 By Mouth Every 6 Hours As Needed  Allergies: 1)  ! Morphine  Past History:  Past medical, surgical, family and social histories (including risk factors) reviewed for relevance to current acute and chronic problems.  Past Medical History: Reviewed history from 01/19/2009 and no changes required. 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 Tubal ligation (04/1999) Carpal tunnel release  B/l--   1996, 2003 septoplasty 01/2004 cryoablation 11/04 ectopic pregnancy 1988 bunion and plantar facsitis-- R foot-- 06/04/2009  Family History: Reviewed history from 12/12/2009 and no changes required. F- Suicide, depression M- brain aneurysm MGM- DM, alz. dementia, uterine CA, HTN, osteoporosis, OA, deceased at 43yo PGM-- intestinal CA ? PGF- Cancer? grandmother had colon polyps No colon cancer allergies: sisters, maternal grandmother  maternal grandfather - lung  Social History: Reviewed history from 12/12/2009 and no changes required. 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  Quit in 1997.  started at age 89.  1 ppd. Alcohol use-no Drug use-no Regular exercise-yes   Review of Systems      See HPI  Physical Exam  General:  Well-developed,well-nourished,in no acute distress; alert,appropriate and cooperative throughout examination Ears:  External ear exam shows no significant lesions or deformities.  Otoscopic examination reveals clear canals, tympanic membranes are intact bilaterally without bulging, retraction, inflammation or discharge. Hearing is grossly normal bilaterally. Nose:  L frontal sinus tenderness, L maxillary sinus tenderness, R frontal sinus tenderness, and R maxillary sinus tenderness.   Mouth:  Oral mucosa and oropharynx without lesions or exudates.  Teeth  in good repair. Neck:  No deformities, masses, or tenderness noted. Lungs:  Normal respiratory  effort, chest expands symmetrically. Lungs are clear to auscultation, no crackles or wheezes. Heart:  normal rate and no murmur.   Extremities:  No clubbing, cyanosis, edema, or deformity noted with normal full range of motion of all joints.   Psych:  Cognition and judgment appear intact. Alert and cooperative with normal attention span and concentration. No apparent delusions, illusions, hallucinations   Impression & Recommendations:  Problem # 1:  FREQUENCY, URINARY (ICD-788.41)  Orders: T-Culture, Urine ([id number])  Discussed use of medication.   Problem # 2:  PALPITATIONS, OCCASIONAL (ICD-785.1)  Avoid caffeine, chocolate, and stimulants. Stress reduction as well as medication options discussed.   Problem # 3:  HEADACHE (ICD-784.0) ? sinusitis---ceftin for 10 days Her updated medication list for this problem includes:    Vicodin Es 7.5-750 Mg Tabs (Hydrocodone-acetaminophen) .Marland Kitchen... 1 by mouth every 6 hours as needed  Orders: Ketorolac-Toradol 15mg  (U9811) Admin of Therapeutic Inj  intramuscular or subcutaneous (91478)  Problem # 5:  HYPOTHYROIDISM (ICD-244.9)  Her updated medication list for this problem includes:    Synthroid 112 Mcg Tabs (Levothyroxine sodium) .Marland Kitchen... 1 by mouth tues, thurs , fri, sat , sun    Synthroid 125 Mcg Tabs (Levothyroxine sodium) .Marland Kitchen... 1 by mouth mw  Complete Medication List: 1)  Synthroid 112 Mcg Tabs (Levothyroxine sodium) .Marland Kitchen.. 1 by mouth tues, thurs , fri, sat , sun 2)  Pristiq 50 Mg Xr24h-tab (Desvenlafaxine succinate) .Marland Kitchen.. 1 by mouth once daily 3)  Clobetasol Propionate 0.05 % Crea (Clobetasol propionate) .... Apply as directed 4)  Potassium Gluconate 595 Mg Tabs (Potassium gluconate) .... Take 1 tablet by mouth once a day 5)  Metrogel 1 % Gel (Metronidazole) .... Apply daily for rosacea 6)  Alprazolam 0.25 Mg Tabs (Alprazolam) .Marland Kitchen.. 1 by mouth three times a day as needed 7)  Lipitor 20 Mg Tabs (Atorvastatin calcium) .Marland Kitchen.. 1 by mouth at  bedtime 8)  Ceftin 500 Mg Tabs (Cefuroxime axetil) .Marland Kitchen.. 1 by mouth two times a day 9)  Synthroid 125 Mcg Tabs (Levothyroxine sodium) .Marland Kitchen.. 1 by mouth mw 10)  Vicodin Es 7.5-750 Mg Tabs (Hydrocodone-acetaminophen) .Marland Kitchen.. 1 by mouth every 6 hours as needed Prescriptions: VICODIN ES 7.5-750 MG TABS (HYDROCODONE-ACETAMINOPHEN) 1 by mouth every 6 hours as needed  #10 x 0   Entered and Authorized by:   Loreen Freud DO   Signed by:   Loreen Freud DO on 03/28/2010   Method used:   Print then Give to Patient   RxID:   [id number] CEFTIN 500 MG TABS (CEFUROXIME AXETIL) 1 by mouth two times a day  #20 x 0   Entered and Authorized by:   Loreen Freud DO   Signed by:   Loreen Freud DO on 03/28/2010   Method used:   Electronically to        [facility] (retail)       [address]       [place], [place]  [address]       Ph: [phone]       Fax: [phone]   RxID:   [id number]   Laboratory Results   Urine Tests   Date/Time Reported: March 28, 2010 8:32 AM  Routine Urinalysis   Color: yellow Appearance: Clear Glucose: negative   (Normal Range: Negative) Bilirubin: negative   (Normal Range: Negative) Ketone: negative   (Normal Range:  Negative) Spec. Gravity: 1.010   (Normal Range: 1.003-1.035) Blood: negative   (Normal Range: Negative) pH: 6.0   (Normal Range: 5.0-8.0) Protein: negative   (Normal Range: Negative) Urobilinogen: 0.2   (Normal Range: 0-1) Nitrite: negative   (Normal Range: Negative) Leukocyte Esterace: negative   (Normal Range: Negative)        Medication Administration  Injection # 1:    Medication: Ketorolac-Toradol 15mg     Diagnosis: HEADACHE (ICD-784.0)    Route: IM    Exp Date: 10/24/2011    Lot #: [id number]    Mfr: [email or web address]    Comments: 60mg  given    Patient tolerated injection without complications    Given by: Jeremy Johann CMA (March 28, 2010 9:05 AM)  Orders Added: 1)  T-Culture, Urine [[id number]] 2)   Ketorolac-Toradol 15mg  [[id number]] 3)  Admin of Therapeutic Inj  intramuscular or subcutaneous [[id number]] 4)  Est. Patient Level III [10175]

## 2010-09-24 NOTE — Letter (Signed)
Summary: Results Follow-up Letter  [place] at [facility]  [address] [place], [place] [address]   Phone: [phone]  Fax: [phone]    01/23/2009        Mandeep A. Skora [address] [place], [place]  [address]  Dear Ms. MCKENNEY,   The following are the results of your recent test(s):  Test     Result     Pap Smear    Normal_______  Not Normal_____       Comments: _________________________________________________________ Cholesterol LDL(Bad cholesterol):          Your goal is less than:         HDL (Good cholesterol):        Your goal is more than: _________________________________________________________ Other Tests:   _________________________________________________________  Please call for an appointment Or __Please see attached labwork._______________________________________________________ _________________________________________________________ _________________________________________________________  Sincerely,  Ardyth Man [place] at [facility]

## 2010-09-24 NOTE — Progress Notes (Signed)
Summary: Needs Manometry   Phone Note Call from Patient Call back at Home Phone [phone]   Call For: Dr Christella Hartigan Summary of Call: Wants to reschedule manometry-Needs first thing in the morning before 11am. Initial call taken by: Leanor Kail [facility],  December 20, 2008 12:49 PM  Follow-up for Phone Call        Patient had manometry rescheduled for 01/08/09 at 11am.  Went over instructions and will have order reserted.  Patient advised if she cancels it will be scheduled farther out. Follow-up by: Paulene Floor, RN,  December 20, 2008 3:25 PM

## 2010-09-24 NOTE — Progress Notes (Signed)
Summary: [facility] 02/08/09  Phone Note Outgoing Call Call back at Home Phone [phone]   Call placed by: Ardyth Man,  February 08, 2009 9:05 AM Call placed to: Patient Summary of Call: NORMAL CT Left message for patient to call the office Ardyth Man  February 08, 2009 9:06 AM   Follow-up for Phone Call        Patient aware Ardyth Man  February 08, 2009 10:36 AM  Follow-up by: Ardyth Man,  February 08, 2009 10:36 AM

## 2010-09-24 NOTE — Consult Note (Signed)
Summary: [facility]   Imported By: Freddy Jaksch 07/29/2007 15:45:50  _____________________________________________________________________  External Attachment:    Type:   Image     Comment:   External Document

## 2010-09-24 NOTE — Progress Notes (Signed)
Summary: GI REFERRAL TO [facility]  Phone Note Call from Patient Call back at Home Phone [phone]   Caller: Patient Call For: Katie Freud DO Reason for Call: Referral Summary of Call: IN REFERENCE TO GASTRO REFERRAL TO [facility], PT WAS SCHEDULED FOR 05-01-2010, BUT HAD TO CALL & RSC'D.  THE NEXT AVAILABLE APPT IS NOT UNTIL LATE OCTOBER-2011.  PATIENT IS ASKING IS THAT OK WITH DR. Laury Axon, OR DOES DR. Laury Axon WANT PT TO SEE SOMEONE ELSE. Initial call taken by: Magdalen Spatz [facility],  April 24, 2010 4:35 PM  Follow-up for Phone Call        thats fine with me if she can wait Katie Freud DO,  April 25, 2010 9:04 AM  I spoke with patient and she does not want to wait that long. Please refer her elsewhere. Lucious Groves CMA,  April 25, 2010 9:42 AM  [facility] Katie Freud DO,  April 25, 2010 10:21 AM    Additional Follow-up for Phone Call Additional follow up Details #1::        pt is willing to see anyone who will see her after the 12th of Sept.      Almeta Monas CMA Duncan Dull)  April 25, 2010 11:49 AM ----  Luster Landsberg please see above--yrlowne 04/25/2010 4pm    PER [facility], PT DID [facility] ORIGINAL APPT, BUT DID NOT RSC'D.  A REFERRAL FORM & ALL INFO FAXED TO [facility], & THEY ARE NOW BOOKING INTO NOVEMBER, STILL AWAITING APPT RESPOSNE FROM [facility] Magdalen Spatz [facility]  May 08, 2010 9:07 AM       Additional Follow-up for Phone Call Additional follow up Details #2::    PT APPT IS WITH [facility] ON 05-29-2010 @ 12:30PM W/DR. [place], PT IS AWARE. Follow-up by: Magdalen Spatz [facility]  May 16, 2010 8:38 AM

## 2010-09-24 NOTE — Progress Notes (Signed)
Summary: pain  Phone Note Outgoing Call   Summary of Call: dr lowne pls advise on pain pt states that she still having chest pain thru shoulder blade. pt would like to know what is she to do about the pain that she is having.Felecia Deloach CMA  September 22, 2008 5:01 PM  Follow-up for Phone Call        called pt--- she took prilosec x1 so far--- no pain today--- she will call next week if pain returns and we will refer to GI. Follow-up by: Loreen Freud DO,  September 23, 2008 10:22 AM

## 2010-09-24 NOTE — Letter (Signed)
Summary: Results Follow-up Letter  [place] at [facility]  [address] [place], [place] [address]   Phone: [phone]  Fax: [phone]    02/05/2009        Dane A. Holub [address] [place], [place]  [address]  Dear Ms. REAGOR,   The following are the results of your recent test(s):  Test     Result     Pap Smear    Normal_______  Not Normal_____       Comments: _________________________________________________________ Cholesterol LDL(Bad cholesterol):          Your goal is less than:         HDL (Good cholesterol):        Your goal is more than: _________________________________________________________ Other Tests:   _________________________________________________________  Please call for an appointment Or __Please see attached lab results._______________________________________________________ _________________________________________________________ _________________________________________________________  Sincerely,  Ardyth Man [place] at [facility]

## 2010-09-24 NOTE — Progress Notes (Signed)
  Phone Note Call from Patient Call back at Home Phone [phone]   Caller: Patient Call For: Loreen Freud DO Summary of Call: Patient calling still in pain would like to know if she can have any kind of scan done that would tell her what is wrong with her.  Ardyth Man  December 20, 2008 11:29 AM   Follow-up for Phone Call        Dr Christella Hartigan had a test scheduled and she cancelled it.  She should probably have that rescheduled---- if normal she should come in to discuss options. Follow-up by: Loreen Freud DO,  December 20, 2008 12:04 PM  Additional Follow-up for Phone Call Additional follow up Details #1::        Patient aware Ardyth Man  December 20, 2008 12:26 PM  Additional Follow-up by: Ardyth Man,  December 20, 2008 12:26 PM

## 2010-09-24 NOTE — Assessment & Plan Note (Signed)
Summary: rto & lab/cbs   Vital Signs:  Patient profile:   45 year old female Height:      63 inches Weight:      174 pounds Temp:     98.3 degrees F oral Pulse rate:   62 / minute Resp:     16 per minute BP sitting:   110 / 74  (left arm)  Vitals Entered By: Ardyth Man (Jan 19, 2009 11:05 AM) CC: labwork,tsh, cholesterol, potassium Is Patient Diabetic? No  Have you ever been in a relationship where you felt threatened, hurt or afraid?No   Does patient need assistance? Functional Status Self care Ambulation Normal   History of Present Illness: Pt here for lab draw only  Allergies: 1)  ! 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 Once daily 2)  Fluoxetine Hcl 10 Mg Caps (Fluoxetine hcl) .... Take 1 tablet by mouth once a day 3)  Claritin 10 Mg Tabs (Loratadine) .... Take 1 tablet by mouth once a day as needed 4)  Clobetasol Propionate 0.05 % Crea (Clobetasol propionate) .... Apply as directed 5)  Potassium Gluconate 595 Mg Tabs (Potassium gluconate) .... Take 1 tablet by mouth once a day  Other Orders: Venipuncture (51884) TLB-BMP (Basic Metabolic Panel-BMET) (80048-METABOL) TLB-TSH (Thyroid Stimulating Hormone) (84443-TSH) T-Lipoprotein blood, by NMR  ([id number]) No Charge Patient Arrived (NCPA0) (NCPA0)

## 2010-09-24 NOTE — Letter (Signed)
Summary: Primary Care Consult Scheduled Letter  [place] [facility] [place]  [address] [place], [place] [address]   Phone: [phone]  Fax: [phone]      07/21/2008 MRN: [id number]  Shriley Horne [address] [place], [place]  [address]    Dear Ms. Renato Gails,      We have scheduled an appointment for you.  At the recommendation of Dr.Lowne, we have scheduled you a consult with Dr. Eden Emms at [facility] on December 1st at 8:45am.  Their address is [address] [place] [place]. The office phone number is [phone].  If this appointment day and time is not convenient for you, please feel free to call the office of the doctor you are being referred to at the number listed above and reschedule the appointment.     It is important for you to keep your scheduled appointments. We are here to make sure you are given good patient care. If you have questions or you have made changes to your appointment, please notify us at  [phone], ask for Tiffany.    Thank you,  Patient Care Coordinator [place] at [facility]

## 2010-09-24 NOTE — Progress Notes (Signed)
  Phone Note Call from Patient Call back at Home Phone [phone]   Caller: Patient Call For: Loreen Freud DO Summary of Call: Patient called office and stated: "the manometry test that Dr. Christella Hartigan ran was normal, what is the next step"? Patient doesn't want any more tests run by Dr. Christella Hartigan per note on Manometry test (append).  Please advise. Ardyth Man  January 24, 2009 11:33 AM   Follow-up for Phone Call        Dr Christella Hartigan apparently had a plan he wanted to discuss but he didn't put it in is note----- if a cxr was not done we should check that and then ov Follow-up by: Loreen Freud DO,  January 24, 2009 12:29 PM  Additional Follow-up for Phone Call Additional follow up Details #1::        Patient aware and 2 view xray ordered. Ardyth Man  January 25, 2009 9:09 AM  Additional Follow-up by: Ardyth Man,  January 25, 2009 9:09 AM

## 2010-09-24 NOTE — Progress Notes (Signed)
Summary: not better since last ov   Phone Note Call from Patient   Reason for Call: Talk to Nurse, Referral Summary of Call: dr. Laury Axon [phone] she is still not better since the last ov. and she would like to have a referral to an obgyn dr. Initial call taken by: Charolette Child,  March 03, 2007 9:10 AM  Follow-up for Phone Call        [facility] referral to be done Follow-up by: Loreen Freud DO,  March 03, 2007 12:46 PM  Additional Follow-up for Phone Call Additional follow up Details #1::        pt informed will be referred Additional Follow-up by: Kandice Hams,  March 03, 2007 1:54 PM  New Problems: MENORRHAGIA (ICD-626.2)   New Problems: MENORRHAGIA (ICD-626.2)

## 2010-09-24 NOTE — Progress Notes (Signed)
  Phone Note Outgoing Call   Summary of Call: Pt. scheduled for manometry at [facility]  on3/23/10 at 1 P.M.Message left for pt. to call me. Initial call taken by: Teryl Lucy RN,  November 02, 2008 2:47 PM  Follow-up for Phone Call        Pt. refused manometry appt. made for 11/14/08 at 1 p.m. because she will not go n.p.o. that long.R/S per her request to 12/11/08 at 8:30 am per [facility]Booking #  [id number].Pt. ntfd. and instructed. Follow-up by: Teryl Lucy RN,  November 03, 2008 10:28 AM

## 2010-09-24 NOTE — Procedures (Signed)
Summary: [facility]   Imported By: Marylou Mccoy 11/07/2009 12:49:51  _____________________________________________________________________  External Attachment:    Type:   Image     Comment:   External Document

## 2010-09-24 NOTE — Progress Notes (Signed)
Summary: [facility] 01/26/09  Phone Note Outgoing Call Call back at [facility] Phone [phone]   Summary of Call: normal xray.  Left message for patient to call the office Ardyth Man  January 26, 2009 8:31 AM   Follow-up for Phone Call        Patient aware Ardyth Man  January 26, 2009 9:42 AM   Additional Follow-up for Phone Call Additional follow up Details #1::        Patient will know what the next step is. Ardyth Man  January 26, 2009 9:42 AM  Additional Follow-up by: Ardyth Man,  January 26, 2009 9:42 AM    Additional Follow-up for Phone Call Additional follow up Details #2::    ov to discuss everything and decide next step   alt # to reach patient [phone] Follow-up by: Loreen Freud DO,  January 26, 2009 11:23 AM  Additional Follow-up for Phone Call Additional follow up Details #3:: Details for Additional Follow-up Action Taken: Patient aware and ov scheduled. Ardyth Man  January 30, 2009 11:52 AM  Additional Follow-up by: Ardyth Man,  January 30, 2009 11:52 AM

## 2010-09-24 NOTE — Assessment & Plan Note (Signed)
Summary: roa--same chest and back pain again//ph   Vital Signs:  Patient Profile:   45 Years Old Female Height:     63 inches Weight:      173.6 pounds Temp:     98.1 degrees F oral Pulse rate:   66 / minute BP sitting:   90 / 70  (left arm)  Pt. in pain?   no  Vitals Entered By: Jeremy Johann CMA (September 28, 2008 11:18 AM)                  PCP:  Laury Axon  Chief Complaint:  follow- up back and chest.  History of Present Illness: Pt here c/o same pain---better with prilosec but not completely better.  No other complaints.    Current Allergies (reviewed today): ! 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)       CRYOTHERAPY, HX OF,  ABLATION (ICD-V15.9)       PREGNANCY, ECTOPIC NEC W/INTRAUTERINE PRG (ICD-633.81)       RESTLESS LEG SYNDROME, HX OF (ICD-V12.49)       HYPOTHYROIDISM (ICD-244.9)       HEADACHE (ICD-784.0)       plantar fascitis---Dr Almedia Balls       Chest Pain:                  Past Surgical History:    Reviewed history from 06/20/2008 and no changes required:       Tubal ligation (04/1999)       Carpal tunnel release  B/l--   1996, 2003       septoplasty 01/2004       cryoablation 11/04          Family History:    Reviewed history from 06/20/2008 and no changes required:       F- Suicide, depression       M- brain aneurysm       MGM- DM, alz. dementia, uterine CA, HTN, osteoporosis, OA, deceased at 45yo       PGM-- intestinal CA ?       PGF- Cancer?  Social History:    Reviewed history from 07/03/2008 and no changes required:       Occupation:  Systems developer [employer]       Married       children       Former smoker.  Quit in 1997.  Smoked 1 ppd x 14 years.       Alcohol use-no       Drug use-no       Regular exercise-yes   Risk Factors: Tobacco use:  never Passive smoke exposure:  no Drug use:  no HIV high-risk behavior:  no Caffeine use:  2 drinks per day Alcohol use:  no Exercise:  yes    Times per week:  3    Type:  walk Seatbelt use:  100 % Sun Exposure:  rarely  Family History Risk Factors:    Family History of MI in females < 55 years old:  no    Family History of MI in males < 57 years old:  no   Review of Systems      See HPI   Physical Exam  General:     Well-developed,well-nourished,in no acute distress; alert,appropriate and cooperative throughout examination Neck:     No deformities, masses, or tenderness noted. Lungs:     Normal respiratory effort, chest expands symmetrically. Lungs are clear to auscultation, no crackles or wheezes. Heart:     Normal rate and regular rhythm. S1 and S2 normal without gallop, murmur, click, rub or other extra sounds. Abdomen:     + mid epigastric tenderness no flank pain soft normal bowel sounds, no distention, no masses, and no guarding.   Psych:     Oriented X3, memory intact for recent and remote, and normally interactive.      Impression & Recommendations:  Problem # 1:  DYSPEPSIA (ICD-536.8) kapidex call if symptoms persist or worsen Orders: Gastroenterology Referral (GI)   Complete Medication List: 1)  Levothyroxine Sodium 112 Mcg Tabs (Levothyroxine sodium) .... Take 1 tablet by mouth every other day 2)  Fluoxetine Hcl 10 Mg Caps (Fluoxetine hcl) .Marland Kitchen.. 1 daily 3)  Claritin 10 Mg Tabs (Loratadine) .Marland Kitchen.. 1 once daily 4)  Clobetasol Propionate 0.05 % Crea (Clobetasol propionate) .... Apply as directed 5)  Synthroid 125 Mcg Tabs (Levothyroxine sodium) .... Take 1 tab once daily 6)  Kapidex 60 Mg Cpdr (Dexlansoprazole) .Marland Kitchen.. 1 by mouth once  daily

## 2010-09-24 NOTE — Assessment & Plan Note (Signed)
History of Present Illness Visit Type: consult Primary GI MD: Katie Bunting MD Primary Provider: Loreen Freud, MD Requesting Provider: Loreen Freud, MD Chief Complaint: chest pain History of Present Illness:     very pleasant 45 year old woman who has had chest, shoulder pains since October, November.  Has had right flank pains in past, thought possible kidney problem, was told it wasn't however and that it was more likley musculoskeletal.  These are happengin more often lately.  Chest pains, daily, constant discomfort that waxes and wanes.   Was sharp, intermittent at first.  Now it is more of a "weird sensation", radiates to right shoulder.  No real pyrosis.  Vomitting once+,  nausea a few times, briefly.  Eating does not reliably bring on the pains, however she has had post prandial pains twice in past few months.  Overall has 13 pounds in past 4 months, cut out sodas.  Cutting out extra sugar.  Has been on diclofenac for past few weeks.  did n't take much NSAIDs prior to then.  Has intermittent constip/diarrhea for many years.  Never seen rectal blood.  Has been on PPI for past 2 weeks, maybe this decrease symptoms a bit.  Has stress echo with Dr. Eden [name] this past December, normal.  she a complete metabolic profile, CBC, thyroid testing for the past month and these were normal except for slightly elevated TSH. She had an abdominal ultrasound last month and this was normal.           Prior Medications Reviewed Using: Medication Bottles  Updated Prior Medication List: LEVOTHYROXINE SODIUM 112 MCG TABS (LEVOTHYROXINE SODIUM) Take 1 tablet by mouth every other day FLUOXETINE HCL 10 MG  CAPS (FLUOXETINE HCL) Take 1 tablet by mouth once a day CLARITIN 10 MG TABS (LORATADINE) Take 1 tablet by mouth once a day as needed CLOBETASOL PROPIONATE 0.05 % CREA (CLOBETASOL PROPIONATE) apply as directed NEXIUM 40 MG CPDR (ESOMEPRAZOLE MAGNESIUM) Take 1 tablet by mouth once a day DICLOFENAC  SODIUM 75 MG TBEC (DICLOFENAC SODIUM) Take 1 tablet by mouth two times a day after meals POTASSIUM GLUCONATE 595 MG TABS (POTASSIUM GLUCONATE) Take 1 tablet by mouth once a day  Current Allergies (reviewed today): ! MORPHINE Past Medical History:    Headache    Hypothyroidism    Current Problems:     DERMATITIS (ICD-692.9)    CONTACT DERMATITIS, RHUS (ICD-692.6)    MENORRHAGIA (ICD-626.2)    PREVENTIVE HEALTH CARE (ICD-V70.0)    CARPAL TUNNEL RELEASE, LEFT, HX OF (ICD-V45.89)    CARPAL TUNNEL RELEASE, RIGHT, HX OF (ICD-V45.89)    DEVIATED NASAL SEPTUM (ICD-470)    CRYOTHERAPY, HX OF,  ABLATION (ICD-V15.9)    PREGNANCY, ECTOPIC NEC W/INTRAUTERINE PRG (ICD-633.81)    RESTLESS LEG SYNDROME, HX OF (ICD-V12.49)    HYPOTHYROIDISM (ICD-244.9)    HEADACHE (ICD-784.0)    plantar fascitis---Dr Katie [name]    Chest Pain:             Past Surgical History:    Tubal ligation (04/1999)    Carpal tunnel release  B/l--   1996, 2003    septoplasty 01/2004    cryoablation 11/04    ectopic pregnancy 1988       Family History:    F- Suicide, depression    M- brain aneurysm    MGM- DM, alz. dementia, uterine CA, HTN, osteoporosis, OA, deceased at 45yo    PGM-- intestinal CA ?    PGF- Cancer?    grandmother had colon  polyps    No colon cancer  Social History:    Occupation: Systems developer [employer]    Married    children    Former smoker.  Quit in 1997.  Smoked 1 ppd x 14 years.    Alcohol use-no    Drug use-no    Regular exercise-yes   Review of Systems       Pertinent positive and negative review of systems were noted in the above HPI and GI specific review of systems.  All other review of systems was otherwise negative.   Vital Signs:  Patient Profile:   45 Years Old Female Height:     63 inches Weight:      172 pounds BMI:     30.58 BSA:     1.82 Pulse rate:   64 / minute Pulse rhythm:   regular BP sitting:   112 / 70  (left arm)  Vitals Entered By: Katie [name] CMA  (October 18, 2008 8:59 AM)                  Physical Exam  Constitutional: generally well appearing Psychiatric: alert and oriented times 3 Eyes: extraocular movements intact Mouth: oropharynx moist, no lesions Neck: supple, no lymphadenopathy Cardiovascular: heart regular rate and rythm Lungs: CTA bilaterally Abdomen: soft, non-tender, non-distended, no obvious ascites, no peritoneal signs, normal bowel sounds Extremities: no lower extremity edema bilaterally Skin: no lesions on visible extremities    Impression & Recommendations:  Problem # 1:  CHEST PAIN UNSPECIFIED (ICD-786.50) Her pain is fairly nonspecific and is located in her chest, this can radiate to her right scapular region. eating does usually not exacerbate symptoms. She does not have reliable pyrosis however her pains do seem somewhat improved since she has been on daily PPI for the past 2-3 weeks.  she has had 2 postprandial pain episodes that are distinct from her chest pain. Ultrasound was normal, CBC and complete metabolic profile were normal. It is not clear that this is related to her GI tract however I would like to proceed with EGD at her soonest convenience. Perhaps she has significant esophagitis. Sometimes chest pains are related to esophageal spasms. If the EGD is unrevealing then I would consider HIDA scan to check for biliary dyskinesia. If that is also unrevealing we will have to consider esophageal manometry if her symptoms persist or worsen. In the meantime she'll continue on Nexium daily.   Patient Instructions: 1)  You should change the way you are taking your antiacid medicine (nexium ) so that you are taking it 20-30 minutes prior to a decent meal as that is the way the pill is designed to work most effectively. 2)  You will be scheduled to have an upper endoscopy. 3)  A copy of this information will be sent to Dr. Laury [name].  Appended Document: Orders Update/EGD    Clinical Lists  Changes  Medications: Changed medication from LEVOTHYROXINE SODIUM 112 MCG TABS (LEVOTHYROXINE SODIUM) Take 1 tablet by mouth every other day to LEVOTHYROXINE SODIUM 112 MCG TABS (LEVOTHYROXINE SODIUM) Take 1 tablet by mouth every day Orders: Added new Test order of EGD (EGD) - Signed

## 2010-09-24 NOTE — Assessment & Plan Note (Signed)
Summary: rov/apc   Visit Type:  Follow-up Copy to:  Loreen Freud Primary Provider/Referring Provider:  Loreen Freud DO  CC:  Pt here for follow up on sleep study results. Pt states sleep pattern is the same.  History of Present Illness: 43/F for evaluation of insomnia of sleep maintenance. Her problem seems to be frequent transient arousals leading to excessive daytime fatigue. She has this sleep pattern evr since she can remember. She has symptoms consistent with restlesslegs syndrome - klonopin helped but stopped due to side effects. Tried homeopathic medication for some time. Husband has noted loud snoring over last 5 years, not positional. She has undergone septoplasty, nasal steroids did not help. Palatal injection did not solve this problem. Epworth Sleepiness Score is 5/24, denies excessive daytime somnolence . Naps are non refreshing. Bedtime is 9pm, sleep latency 15 mins, wakes up several times but rarely stays up, vivid dreams, wakes up at 5 am tired, no headaches, 1 cup coffee /day. Recent holter for palpitations was non diagnostic She takes pristiq for anxiety (used to be on prozac) , has not needed alprazolam  February 21, 2010 10:36 AM  PSG showed AHI of 0.7/h but 78 RERAs with TST 366 mins &  RDI of 13/h, arousaL INDEX OF 92 /H with lowest desatn 91%, PLM arousals were 6/h >> s/o upper airway resistance syndrome discussed options for restless legs & UARS labs >> low ferritin  Allergies: 1)  ! Morphine  Past History:  Past Medical History: Last updated: 01/19/2009 Headache Hypothyroidism Current Problems:  DERMATITIS (ICD-692.9) CONTACT DERMATITIS, RHUS (ICD-692.6) MENORRHAGIA (ICD-626.2) PREVENTIVE HEALTH CARE (ICD-V70.0) CARPAL TUNNEL RELEASE, LEFT, HX OF (ICD-V45.89) CARPAL TUNNEL RELEASE, RIGHT, HX OF (ICD-V45.89) DEVIATED NASAL SEPTUM (ICD-470) CRYOTHERAPY, HX OF,  ABLATION (ICD-V15.9) PREGNANCY, ECTOPIC NEC W/INTRAUTERINE PRG (ICD-633.81) RESTLESS LEG SYNDROME,  HX OF (ICD-V12.49) HYPOTHYROIDISM (ICD-244.9) HEADACHE (ICD-784.0) plantar fascitis---Dr Almedia Balls Chest Pain:    Hyperlipidemia  Social History: Last updated: 12/12/2009 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  Quit in 1997.  started at age 89.  1 ppd. Alcohol use-no Drug use-no Regular exercise-yes   Review of Systems  The patient denies anorexia, fever, weight loss, weight gain, vision loss, decreased hearing, hoarseness, chest pain, syncope, dyspnea on exertion, peripheral edema, prolonged cough, headaches, hemoptysis, hematuria, muscle weakness, suspicious skin lesions, difficulty walking, depression, unusual weight change, and abnormal bleeding.    Vital Signs:  Patient profile:   45 year old female Height:      63 inches Weight:      176 pounds BMI:     31.29 O2 Sat:      98 % on Room air Temp:     97.7 degrees F oral Pulse rate:   75 / minute BP sitting:   116 / 80  (right arm) Cuff size:   regular  Vitals Entered By: Zackery Barefoot CMA (February 21, 2010 10:26 AM)  O2 Flow:  Room air CC: Pt here for follow up on sleep study results. Pt states sleep pattern is the same Comments Medications reviewed with patient Verified contact number and pharmacy with patient Zackery Barefoot CMA  February 21, 2010 10:27 AM    Physical Exam  Additional Exam:  Gen. Pleasant, well-nourished, in no distress, normal affect ENT - no lesions, no post nasal drip, class 2 airway Neck: No JVD, no thyromegaly, no carotid bruits Lungs: no use of accessory muscles, no dullness to percussion, clear without rales or rhonchi  Cardiovascular: Rhythm regular, heart sounds  normal, no  murmurs or gallops, no peripheral edema Musculoskeletal: No deformities, no cyanosis or clubbing    Impression & Recommendations:  Problem # 1:  RESTLESS LEG SYNDROME (ICD-333.99) Ferritin low - replete with iron recheck in 2 mnths >> if no improvement once ferritin > 50 , then try  ropinirole Orders: TLB-Ferritin (82728-FER) TLB-IBC Pnl (Iron/FE;Transferrin) (83550-IBC) Est. Patient Level III (95621) Prescription Created Electronically [id number])  Problem # 2:  OBSTRUCTIVE SLEEP APNEA (ICD-780.57)  The pathophysiology of obstructive sleep apnea, it's cardiovascular consequences and modes of treatment including CPAP were discussed with the patient in great detail.  weight loss for now  Orders: Est. Patient Level III (78469) Prescription Created Electronically [id number])  Medications Added to Medication List This Visit: 1)  Ropinirole Hcl 0.5 Mg Tabs (Ropinirole hcl) .... At bedtime 2)  Ferrous Sulfate 325 (65 Fe) Mg Tbec (Ferrous sulfate) .... Two times a day  Patient Instructions: 1)  Please schedule a follow-up appointment in 2 months. 2)  If iron levels nml, start ropinirole at 0.5 mg at bedtime , increase to 2 tb sif needed & call back in 1 week Prescriptions: FERROUS SULFATE 325 (65 FE) MG TBEC (FERROUS SULFATE) two times a day  #60 x 1   Entered and Authorized by:   Comer Locket Vassie Loll MD   Signed by:   Comer Locket Vassie Loll MD on 02/21/2010   Method used:   Electronically to        [facility] (retail)       [address]       [place], [place]  [address]       Ph: [phone]       Fax: [phone]   RxID:   [id number] ROPINIROLE HCL 0.5 MG TABS (ROPINIROLE HCL) at bedtime  #30 x 1   Entered and Authorized by:   Comer Locket. Vassie Loll MD   Signed by:   Comer Locket Vassie Loll MD on 02/21/2010   Method used:   [facility]        [facility] ([facility]ail)       [address]       [place], [place]  [address]       Ph: [phone]       Fax: [phone]   RxID:   [id number]

## 2010-09-24 NOTE — Progress Notes (Signed)
Summary: nos appt  Phone Note Call from Patient   Caller: [email or web address]  Call For: alva Summary of Call: Rsc nos from 6/28 tp 6/30 @ 10a. Initial call taken by: Darletta Moll,  February 20, 2010 9:09 AM

## 2010-09-24 NOTE — Letter (Signed)
Summary: Wellness Program Form/[employer]  Wellness Program Form/[employer]   Imported By: Lanelle Bal 07/02/2010 14:16:10  _____________________________________________________________________  External Attachment:    Type:   Image     Comment:   External Document

## 2010-09-24 NOTE — Assessment & Plan Note (Signed)
Summary: discuss pain issues/mu   Vital Signs:  Patient profile:   45 year old female Height:      63 inches Weight:      171.13 pounds BMI:     30.42 Temp:     98.2 degrees F oral Pulse rate:   68 / minute Resp:     16 per minute BP sitting:   116 / 80  (right arm)  Vitals Entered By: Ardyth Man (February 01, 2009 10:01 AM) CC: Discuss pain issues Is Patient Diabetic? No  Have you ever been in a relationship where you felt threatened, hurt or afraid?No   Does patient need assistance? Functional Status Self care Ambulation Normal   History of Present Illness: Pt here still c/o chest and back pain.  Pt admits to increased stress an anxiety.  Her mother in law died in 2022/12/16 with pancreatic cancer and husband lost job but now is working as a Ambulance person but pt feels like  prozac may not be working anymore.   Pt has also tried zoloft but was a "zombie" on it.    Pt also c/o back pain R > L that moves around to the front.  Pt is on her period right now.  Her son with hx kidney stones.  Pain comes and goes.    Problems Prior to Update: 1)  Hyperlipidemia  (ICD-272.4) 2)  Hypokalemia, Mild  (ICD-276.8) 3)  Dyspepsia  (ICD-536.8) 4)  Abdominal Pain Other Specified Site  (ICD-789.09) 5)  Palpitations, Recurrent  (ICD-785.1) 6)  Chest Pain Unspecified  (ICD-786.50) 7)  Insomnia, Chronic  (ICD-307.42) 8)  Snoring  (ICD-786.09) 9)  Plantar Fasciitis, Bilateral  (ICD-728.71) 10)  Dermatitis  (ICD-692.9) 11)  Contact Dermatitis, Rhus  (ICD-692.6) 12)  Menorrhagia  (ICD-626.2) 13)  Preventive Health Care  (ICD-V70.0) 14)  Carpal Tunnel Release, Left, Hx of  (ICD-V45.89) 15)  Carpal Tunnel Release, Right, Hx of  (ICD-V45.89) 16)  Deviated Nasal Septum  (ICD-470) 17)  Cryotherapy, Hx Of, Ablation  (ICD-V15.9) 18)  Pregnancy, Ectopic Nec W/intrauterine Prg  (ICD-633.81) 19)  Restless Leg Syndrome, Hx of  (ICD-V12.49) 20)  Hypothyroidism  (ICD-244.9) 21)  Headache   (ICD-784.0)  Medications Prior to Update: 1)  Levothyroxine Sodium 125 Mcg Tabs (Levothyroxine Sodium) .... Once Daily 2)  Fluoxetine Hcl 10 Mg  Caps (Fluoxetine Hcl) .... Take 1 Tablet By Mouth Once A Day 3)  Claritin 10 Mg Tabs (Loratadine) .... Take 1 Tablet By Mouth Once A Day As Needed 4)  Clobetasol Propionate 0.05 % Crea (Clobetasol Propionate) .... Apply As Directed 5)  Potassium Gluconate 595 Mg Tabs (Potassium Gluconate) .... Take 1 Tablet By Mouth Once A Day  Current Medications (verified): 1)  Levothyroxine Sodium 125 Mcg Tabs (Levothyroxine Sodium) .... Once Daily 2)  Pristiq 50 Mg Xr24h-Tab (Desvenlafaxine Succinate) .Marland Kitchen.. 1 By Mouth Once Daily 3)  Claritin 10 Mg Tabs (Loratadine) .... Take 1 Tablet By Mouth Once A Day As Needed 4)  Clobetasol Propionate 0.05 % Crea (Clobetasol Propionate) .... Apply As Directed 5)  Potassium Gluconate 595 Mg Tabs (Potassium Gluconate) .... Take 1 Tablet By Mouth Once A Day 6)  Taclonex 0.005-0.064 % Oint (Calcipotriene-Betameth Diprop) .... Apply Twice Daily For Psoriasis 7)  Metrogel 1 % Gel (Metronidazole) .... Apply Daily For Rosacea 8)  Alprazolam 0.25 Mg Tabs (Alprazolam) .Marland Kitchen.. 1 By Mouth Three Times A Day As Needed  Allergies (verified): 1)  ! Morphine  Past History:  Past medical, surgical, family and social  histories (including risk factors) reviewed, and no changes noted (except as noted below).  Past Medical History: Reviewed history from 01/19/2009 and no changes required. Headache Hypothyroidism Current Problems:  DERMATITIS (ICD-692.9) CONTACT DERMATITIS, RHUS (ICD-692.6) MENORRHAGIA (ICD-626.2) PREVENTIVE HEALTH CARE (ICD-V70.0) CARPAL TUNNEL RELEASE, LEFT, HX OF (ICD-V45.89) CARPAL TUNNEL RELEASE, RIGHT, HX OF (ICD-V45.89) DEVIATED NASAL SEPTUM (ICD-470) CRYOTHERAPY, HX OF,  ABLATION (ICD-V15.9) PREGNANCY, ECTOPIC NEC W/INTRAUTERINE PRG (ICD-633.81) RESTLESS LEG SYNDROME, HX OF (ICD-V12.49) HYPOTHYROIDISM  (ICD-244.9) HEADACHE (ICD-784.0) plantar fascitis---Dr Almedia Balls Chest Pain:    Hyperlipidemia  Past Surgical History: Reviewed history from 10/18/2008 and no changes required. Tubal ligation (04/1999) Carpal tunnel release  B/l--   1996, 2003 septoplasty 01/2004 cryoablation 11/04 ectopic pregnancy 1988  Family History: Reviewed history from 10/18/2008 and no changes required. F- Suicide, depression M- brain aneurysm MGM- DM, alz. dementia, uterine CA, HTN, osteoporosis, OA, deceased at 45yo PGM-- intestinal CA ? PGF- Cancer? grandmother had colon polyps No colon cancer  Social History: Reviewed history from 10/18/2008 and no changes required. 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  Quit in 1997.  Smoked 1 ppd x 14 years. Alcohol use-no Drug use-no Regular exercise-yes   Review of Systems      See HPI  Physical Exam  General:  Well-developed,well-nourished,in no acute distress; alert,appropriate and cooperative throughout examination Neck:  No deformities, masses, or tenderness noted. Lungs:  Normal respiratory effort, chest expands symmetrically. Lungs are clear to auscultation, no crackles or wheezes. Heart:  Normal rate and regular rhythm. S1 and S2 normal without gallop, murmur, click, rub or other extra sounds. Abdomen:  Bowel sounds positive,abdomen soft and non-tender without masses, organomegaly or hernias noted. Skin:  Intact without suspicious lesions or rashes Cervical Nodes:  No lymphadenopathy noted Psych:  Cognition and judgment appear intact. Alert and cooperative with normal attention span and concentration. No apparent delusions, illusions, hallucinations   Impression & Recommendations:  Problem # 1:  OTHER ANXIETY STATES (ICD-300.09)  Her updated medication list for this problem includes:    Pristiq 50 Mg Xr24h-tab (Desvenlafaxine succinate) .Marland Kitchen... 1 by mouth once daily    Alprazolam 0.25 Mg Tabs (Alprazolam) .Marland Kitchen... 1 by  mouth three times a day as needed  Orders: UA Dipstick w/o Micro (automated)  (81003)  Discussed medication use and relaxation techniques.   Problem # 2:  BACK PAIN (ICD-724.5)  Orders: UA Dipstick w/o Micro (automated)  (09323) Radiology Referral (Radiology)  Discussed use of moist heat or ice, modified activities, medications, and stretching/strengthening exercises. Back care instructions given. To be seen in 2 weeks if no improvement; sooner if worsening of symptoms.   Complete Medication List: 1)  Levothyroxine Sodium 125 Mcg Tabs (Levothyroxine sodium) .... Once daily 2)  Pristiq 50 Mg Xr24h-tab (Desvenlafaxine succinate) .Marland Kitchen.. 1 by mouth once daily 3)  Claritin 10 Mg Tabs (Loratadine) .... Take 1 tablet by mouth once a day as needed 4)  Clobetasol Propionate 0.05 % Crea (Clobetasol propionate) .... Apply as directed 5)  Potassium Gluconate 595 Mg Tabs (Potassium gluconate) .... Take 1 tablet by mouth once a day 6)  Taclonex 0.005-0.064 % Oint (Calcipotriene-betameth diprop) .... Apply twice daily for psoriasis 7)  Metrogel 1 % Gel (Metronidazole) .... Apply daily for rosacea 8)  Alprazolam 0.25 Mg Tabs (Alprazolam) .Marland Kitchen.. 1 by mouth three times a day as needed Prescriptions: ALPRAZOLAM 0.25 MG TABS (ALPRAZOLAM) 1 by mouth three times a day as needed  #30 x 0   Entered and Authorized by:   Loreen Freud  DO   Signed by:   Loreen Freud DO on 02/01/2009   Method used:   Print then Give to Patient   RxID:   [id number] PRISTIQ 50 MG XR24H-TAB (DESVENLAFAXINE SUCCINATE) 1 by mouth once daily  #30 x 2   Entered and Authorized by:   Loreen Freud DO   Signed by:   Loreen Freud DO on 02/01/2009   Method used:   Print then Give to Patient   RxID:   [id number] PRISTIQ 50 MG XR24H-TAB (DESVENLAFAXINE SUCCINATE) 1 by mouth once daily  #30 x 2   Entered and Authorized by:   Loreen Freud DO   Signed by:   Loreen Freud DO on 02/01/2009   Method used:   Print then Give to Patient    RxID:   [id number]   Laboratory Results   Urine Tests  Date/Time Recieved: Ardyth Man  February 01, 2009 10:38 AM  Date/Time Reported: Ardyth Man  February 01, 2009 10:38 AM   Routine Urinalysis   Color: yellow Appearance: Clear Glucose: negative   (Normal Range: Negative) Bilirubin: negative   (Normal Range: Negative) Ketone: negative   (Normal Range: Negative) Spec. Gravity: 1.020   (Normal Range: 1.003-1.035) Blood: large   (Normal Range: Negative) pH: 6.5   (Normal Range: 5.0-8.0) Protein: negative   (Normal Range: Negative) Urobilinogen: negative   (Normal Range: 0-1) Nitrite: negative   (Normal Range: Negative) Leukocyte Esterace: negative   (Normal Range: Negative)    Comments: Patient is on her menstrual. cycle.

## 2010-09-24 NOTE — Assessment & Plan Note (Signed)
Summary: snoring/apc   Visit Type:  Initial Consult PCP:  Laury Axon  Chief Complaint:  Sleep Consultation.Marland Kitchen  History of Present Illness: Ms. Demonbreun is a 45 year old woman who presents with a chief complaint of trouble going to sleep and staying asleep. She describes snoring that occasionally wakes her up from sleep. She saw an ENT physician for this and her tonsillar pillars were injected in 2005 but this did not resolve the problem. she has been taking Prozac for many years for anxiety. She describes crampiness in her legs before going to sleep and she took over-the-counter medicine ( restful legs) and feels that this is not an active problem anymore. Bedtime is 8 to 9 PM, sleep latency is 1-2 hours and she spends this time in bed watching TV. She has numerous spontaneous arousals and gets out of bed around 5 AM feeling tired. She denies headaches and dryness of mouth. she's gained about 10 pounds in the past 2 years. Benadryl or Unisom gives her 1-2 hours of sleep but then the problem recurs. On weekends she will stay in bed until 9 AM. She drinks one cup of coffee and a 20 ounce soda everyday. Anemia has been corrected by iron supplements. There is no history suggestive of cataplexy, sleep paralysis or parasomnias  Epworth Sleepiness Score 6/24    History of Present Illness: Trouble with going to sleep and staying asleep.  Wake up many times duringthe night snoring.  What time do you typically go to bed?(between what hours): 8 to 9 pm  How long does it take you to fall asleep? 1-2 hours  How many times during the night do you wake up? "alot"  What time do you get out of bed to start your day? 4:45 am to 5:00 am  Do you drive or operate heavy machinery in your occupation? no  How much has your weight changed (up or down) over the past two years? (in pounds): 10 lb increase  Have you ever had a sleep study before?  If yes,when and where: no   Do you currently use CPAP ? If so , at what  pressure? no  Do you wear oxygen at any time? If yes, how many liters per minute? no   Updated Prior Medication List: LEVOTHYROXINE SODIUM 112 MCG TABS (LEVOTHYROXINE SODIUM) Take 1 tablet by mouth every other day FLUOXETINE HCL 10 MG  CAPS (FLUOXETINE HCL) 1 daily CLARITIN 10 MG TABS (LORATADINE) 1 once daily CLOBETASOL PROPIONATE 0.05 % CREA (CLOBETASOL PROPIONATE) apply as directed  Current Allergies: ! MORPHINE  Past Medical History:    Reviewed history from 06/20/2008 and no changes required:       Headache       Hypothyroidism       Current Problems:        DERMATITIS (ICD-692.9)       CONTACT DERMATITIS, RHUS (ICD-692.6)       MENORRHAGIA (ICD-626.2)       PREVENTIVE HEALTH CARE (ICD-V70.0)       CARPAL TUNNEL RELEASE, LEFT, HX OF (ICD-V45.89)       CARPAL TUNNEL RELEASE, RIGHT, HX OF (ICD-V45.89)       DEVIATED NASAL SEPTUM (ICD-470)       CRYOTHERAPY, HX OF,  ABLATION (ICD-V15.9)       PREGNANCY, ECTOPIC NEC W/INTRAUTERINE PRG (ICD-633.81)       RESTLESS LEG SYNDROME, HX OF (ICD-V12.49)       HYPOTHYROIDISM (ICD-244.9)       HEADACHE (ICD-784.0)  plantar fascitis---Dr Almedia Balls                Past Surgical History:    Reviewed history from 06/20/2008 and no changes required:       Tubal ligation (04/1999)       Carpal tunnel release  B/l--   1996, 2003       septoplasty 01/2004       cryoablation 11/04          Family History:    Reviewed history from 06/20/2008 and no changes required:       F- Suicide, depression       M- brain aneurysm       MGM- DM, alz. dementia, uterine CA, HTN, osteoporosis, OA, deceased at 45yo       PGM-- intestinal CA ?       PGF- Cancer?  Social History:    Reviewed history from 06/20/2008 and no changes required:       Occupation: Systems developer [employer]       Married       children       Former smoker.  Quit in 1997.  Smoked 1 ppd x 14 years.       Alcohol use-no       Drug use-no       Regular  exercise-yes    Review of Systems  The patient denies anorexia, fever, weight loss, weight gain, vision loss, decreased hearing, hoarseness, chest pain, syncope, dyspnea on exertion, peripheral edema, prolonged cough, headaches, hemoptysis, abdominal pain, melena, hematochezia, severe indigestion/heartburn, hematuria, incontinence, genital sores, muscle weakness, suspicious skin lesions, transient blindness, difficulty walking, depression, unusual weight change, abnormal bleeding, enlarged lymph nodes, angioedema, breast masses, and testicular masses.     Vital Signs:  Patient Profile:   45 Years Old Female Height:     63 inches Weight:      183 pounds O2 Sat:      99 % O2 treatment:    Room Air Temp:     98.4 degrees F oral Pulse rate:   66 / minute BP sitting:   128 / 84  (left arm)  Vitals Entered By: Vernie Murders (July 03, 2008 2:08 PM)                 Physical Exam  General:     normal appearance and healthy appearing.   HEENT - no thrush, no post nasal drip No JVD, no lymphadenopathy CVS- s1s2 nml, no murmur RS- clear, no crackles or rhonchi Abd- soft, non-tender, no organomegaly CNS- non focal Ext - no edema     Pulmonary Function Test Height (in.): 63 Gender: Female    Impression & Recommendations:  Problem # 1:  INSOMNIA, CHRONIC (ICD-307.42) her problem seems to be more insomnia related. I could not detect any overt symptoms of depression.  I did not detect any red flags that point towards obstructive sleep apnea except for her mild snoring. Restless legs does not seem to be be a problem anymore. As such I did not feel any immediate need of an overnight polysomnogram. I think our efforts are better detected towards her insomnia. I spent 15 minutes discussing cognitive behavior techniques with her for sleep consolidation. Try to avoid caffeine after 3 pm Light exercise x 20-30 mins  daily  Pl fill out the sleep diary & fax/ send it to  me. Behaviour therapy techniques discussed- Fixed bedtime No TV Consolidate sleep 4-5 hrs every night, fix wake  up times on weekends Orders: Consultation Level III ([address])   Medications Added to Medication List This Visit: 1)  Claritin 10 Mg Tabs (Loratadine) .Marland Kitchen.. 1 once daily   Patient Instructions: 1)  Please schedule a follow-up appointment in 3 months. 2)  Try to avoid caffeine after 3 pm 3)  Light exercise x 20-30 mins  daily  4)  Pl fill out the sleep diary & fax/ send it to me. 5)  Behaviour therapy techniques discussed- 6)  Fixed bedtime 7)  No TV 8)  Consolidate sleep 4-5 hrs every night, fix wake up times on weekends   ]

## 2010-09-24 NOTE — Letter (Signed)
Summary: [facility]: Immunoassay Fecal Occult Blood (iFOB) Order Form  [facility] [place]  [address] [place], [place] [address]   Phone: [phone]  Fax: [phone]      [facility]: Immunoassay Fecal Occult Blood (iFOB) Order Form   October 29, 2009 MRN: [id number]   Katie [name] 1965-09-15   Physicican Name:_____Yvonne Lowne,DO____________________  Diagnosis Code:_______V76.51___________________      Army Fossa CMA

## 2010-09-24 NOTE — Letter (Signed)
Summary: dermatology office visit  dermatology office visit   Imported By: Freddy Jaksch 09/08/2007 10:36:57  _____________________________________________________________________  External Attachment:    Type:   Image     Comment:   External Document

## 2010-09-24 NOTE — Progress Notes (Signed)
Summary: Refill Request  Phone Note Refill Request Call back at [phone] Message from:  Pharmacy on February 11, 2010 9:27 AM  Refills Requested: Medication #1:  SYNTHROID 112 MCG TABS 1 by mouth daily.   Dosage confirmed as above?Dosage Confirmed   Supply Requested: 1 month [facility] on [address]  Next Appointment Scheduled: none Initial call taken by: Harold Barban,  February 11, 2010 9:27 AM    Prescriptions: SYNTHROID 112 MCG TABS (LEVOTHYROXINE SODIUM) 1 by mouth daily.  #30 x 2   Entered by:   Army Fossa CMA   Authorized by:   Loreen Freud DO   Signed by:   Army Fossa CMA on 02/11/2010   Method used:   Electronically to        [facility] (retail)       [address]       [place], [place]  [address]       Ph: [phone]       Fax: [phone]   RxID:   [id number]

## 2010-09-24 NOTE — Progress Notes (Signed)
Summary: xcx appt    Phone Note Call from Patient Call back at Home Phone [phone]   Caller: Patient Call For: Aram Domzalski Reason for Call: Talk to Nurse Summary of Call: Patient wants to cancel monemetry that's schedule for Monday at [facility] Initial call taken by: Tawni Levy,  December 08, 2008 9:57 AM  Follow-up for Phone Call        wants to cx mano due to mother in law in Hospice and not doing well. called karen and cxed and advised patient to call back when she wants to have procedure done. Follow-up by: Harlow Mares CMA,  December 08, 2008 10:05 AM      Appended Document: xcx appt  ok

## 2010-09-24 NOTE — Assessment & Plan Note (Signed)
Summary: CPX AND PAP/ALJ  Medications Added LEVOTHYROXINE SODIUM 112 MCG TABS (LEVOTHYROXINE SODIUM) Take 1 tablet by mouth once a day LEVOTHYROXINE SODIUM 112 MCG TABS (LEVOTHYROXINE SODIUM) Take 1 tablet by mouth every other day FLUOXETINE HCL 10 MG  CAPS (FLUOXETINE HCL) 1 daily LEVOTHROID 100 MCG  TABS (LEVOTHYROXINE SODIUM) 1 by mouth on Tues, Thurs and Sunday        Vital Signs:  Patient Profile:   45 Years Old Female Weight:      166.4 pounds Pulse rate:   76 / minute Resp:     12  per minute BP sitting:   118 / 76  (right arm)  Pt. in pain?   no  Vitals Entered By: Doristine Devoid (February 22, 2007 9:15 AM)                PCP:  Laury Axon  Chief Complaint:  CPX AND PAP.  History of Present Illness: Pt here for CpE and pap.  No complaints except periods are sometimes heavy.      Past Medical History:    Headache    Hypothyroidism  Past Surgical History:    Tubal ligation (04/1999)   Family History:    F- Suicide, depression    M- brain aneurysm    MGM- DM, alz. dementia, uterine CA, HTN, osteoporosis, OA    PGM-- intestinal CA ?    PGF- Cancer?  Social History:    Occupation: Research scientist (physical sciences). directives.    Married    Never Smoked    Alcohol use-no    Drug use-no    Regular exercise-yes   Risk Factors:  Tobacco use:  never Passive smoke exposure:  no Drug use:  no HIV high-risk behavior:  no Caffeine use:  2 drinks per day Alcohol use:  no Exercise:  yes    Times per week:  3    Type:  walk Seatbelt use:  100 % Sun Exposure:  rarely  Family History Risk Factors:    Family History of MI in females < 11 years old:  no    Family History of MI in males < 47 years old:  no   Review of Systems      See HPI  General      Denies chills, fatigue, fever, loss of appetite, malaise, sleep disorder, sweats, weakness, and weight loss.  Eyes      Denies blurring, discharge, double vision, eye irritation, eye pain, halos, itching, light sensitivity,  red eye, vision loss-1 eye, and vision loss-both eyes.      ophtho-q year  ENT      Denies decreased hearing, difficulty swallowing, ear discharge, earache, hoarseness, nasal congestion, nosebleeds, postnasal drainage, ringing in ears, sinus pressure, and sore throat.      dentist- Q 6 months  CV      Denies bluish discoloration of lips or nails, chest pain or discomfort, difficulty breathing at night, difficulty breathing while lying down, fainting, fatigue, leg cramps with exertion, lightheadness, near fainting, palpitations, shortness of breath with exertion, swelling of feet, swelling of hands, and weight gain.  Resp      Denies chest discomfort, chest pain with inspiration, cough, coughing up blood, excessive snoring, hypersomnolence, morning headaches, pleuritic, shortness of breath, sputum productive, and wheezing.  GI      Denies abdominal pain, bloody stools, change in bowel habits, constipation, dark tarry stools, diarrhea, excessive appetite, gas, hemorrhoids, indigestion, loss of appetite, nausea, vomiting, vomiting blood, and yellowish skin color.  GU  Denies abnormal vaginal bleeding, decreased libido, discharge, dysuria, genital sores, hematuria, incontinence, nocturia, urinary frequency, and urinary hesitancy.  MS      Denies joint pain, joint redness, joint swelling, loss of strength, low back pain, mid back pain, muscle aches, muscle , cramps, muscle weakness, stiffness, and thoracic pain.  Derm      Denies changes in color of skin, changes in nail beds, dryness, excessive perspiration, flushing, hair loss, insect bite(s), itching, lesion(s), poor wound healing, and rash.  Neuro      Denies difficulty with concentration, disturbances in coordination, falling down, headaches, numbness, tingling, tremors, visual disturbances, and weakness.  Psych      Denies alternate hallucination ( auditory/visual), anxiety, depression, easily angered, easily tearful, irritability,  mental problems, panic attacks, sense of great danger, suicidal thoughts/plans, thoughts of violence, unusual visions or sounds, and thoughts /plans of harming others.  Endo      Denies cold intolerance, excessive hunger, excessive thirst, excessive urination, heat intolerance, polyuria, and weight change.  Heme      Denies abnormal bruising, bleeding, enlarge lymph nodes, fevers, pallor, and skin discoloration.  Allergy      Denies hives or rash, itching eyes, persistent infections, seasonal allergies, and sneezing.   Physical Exam  General:     Well-developed,well-nourished,in no acute distress; alert,appropriate and cooperative throughout examination Head:     Normocephalic and atraumatic without obvious abnormalities. No apparent alopecia or balding. Eyes:     No corneal or conjunctival inflammation noted. EOMI. Perrla. Funduscopic exam benign, without hemorrhages, exudates or papilledema. Vision grossly normal. Ears:     External ear exam shows no significant lesions or deformities.  Otoscopic examination reveals clear canals, tympanic membranes are intact bilaterally without bulging, retraction, inflammation or discharge. Hearing is grossly normal bilaterally. Nose:     External nasal examination shows no deformity or inflammation. Nasal mucosa are pink and moist without lesions or exudates. Mouth:     Oral mucosa and oropharynx without lesions or exudates.  Teeth in good repair. Neck:     No deformities, masses, or tenderness noted. Chest Wall:     No deformities, masses, or tenderness noted. Breasts:     No mass, nodules, thickening, tenderness, bulging, retraction, inflamation, nipple discharge or skin changes noted.   Lungs:     Normal respiratory effort, chest expands symmetrically. Lungs are clear to auscultation, no crackles or wheezes. Heart:     normal rate, regular rhythm, and no murmur.   Abdomen:     Bowel sounds positive,abdomen soft and non-tender without  masses, organomegaly or hernias noted. Genitalia:     Pelvic Exam:        External: normal female genitalia without lesions or masses        Vagina: normal without lesions or masses        Cervix: normal without lesions or masses        Adnexa: normal bimanual exam without masses or fullness        Uterus: normal by palpation        Pap smear: performed Msk:     normal ROM, no joint tenderness, no joint swelling, no joint warmth, no redness over joints, and no joint deformities.   Pulses:     R radial normal, R posterior tibial normal, R dorsalis pedis normal, R carotid normal, L radial normal, L posterior tibial normal, and L dorsalis pedis normal.   Extremities:     No clubbing, cyanosis, edema, or deformity noted with normal  full range of motion of all joints.   Neurologic:     alert & oriented X3, cranial nerves II-XII intact, strength normal in all extremities, and gait normal.   Skin:     Mult moles.no suspicious lesions.   Cervical Nodes:     No lymphadenopathy noted Axillary Nodes:     No palpable lymphadenopathy Psych:     Cognition and judgment appear intact. Alert and cooperative with normal attention span and concentration. No apparent delusions, illusions, hallucinations    Impression & Recommendations:  Problem # 1:  PREVENTIVE HEALTH CARE (ICD-V70.0) Assessment: New SBE,  check labs GHM utd Orders: Venipuncture (81191) TLB-Lipid Panel (80061-LIPID) TLB-BMP (Basic Metabolic Panel-BMET) (80048-METABOL) TLB-CBC Platelet - w/Differential (85025-CBCD) TLB-Hepatic/Liver Function Pnl (80076-HEPATIC) TLB-TSH (Thyroid Stimulating Hormone) (84443-TSH)   Problem # 2:  HYPOTHYROIDISM (ICD-244.9)  Her updated medication list for this problem includes:    Levothyroxine Sodium 112 Mcg Tabs (Levothyroxine sodium) .Marland Kitchen... Take 1 tablet by mouth every other day    Levothroid 100 Mcg Tabs (Levothyroxine sodium) .Marland Kitchen... 1 by mouth on tues, thurs and  sunday  Orders: Venipuncture (47829) TLB-Lipid Panel (80061-LIPID) TLB-BMP (Basic Metabolic Panel-BMET) (80048-METABOL) TLB-CBC Platelet - w/Differential (85025-CBCD) TLB-Hepatic/Liver Function Pnl (80076-HEPATIC) TLB-TSH (Thyroid Stimulating Hormone) (84443-TSH)   Medications Added to Medication List This Visit: 1)  Levothyroxine Sodium 112 Mcg Tabs (Levothyroxine sodium) .... Take 1 tablet by mouth once a day 2)  Levothyroxine Sodium 112 Mcg Tabs (Levothyroxine sodium) .... Take 1 tablet by mouth every other day 3)  Fluoxetine Hcl 10 Mg Caps (Fluoxetine hcl) .Marland Kitchen.. 1 daily 4)  Levothroid 100 Mcg Tabs (Levothyroxine sodium) .Marland Kitchen.. 1 by mouth on tues, thurs and sunday     Prescriptions: LEVOTHROID 100 MCG  TABS (LEVOTHYROXINE SODIUM) 1 by mouth on Tues, Thurs and Sunday  #15 x 11   Entered and Authorized by:   Loreen Freud DO   Signed by:   Loreen Freud DO on 02/22/2007   Method used:   Reprint   RxID:   [id number] FLUOXETINE HCL 10 MG  CAPS (FLUOXETINE HCL) 1 daily  #30 x 11   Entered and Authorized by:   Loreen Freud DO   Signed by:   Loreen Freud DO on 02/22/2007   Method used:   Reprint   RxID:   [id number] LEVOTHYROXINE SODIUM 112 MCG TABS (LEVOTHYROXINE SODIUM) Take 1 tablet by mouth every other day  #15 x 11   Entered and Authorized by:   Loreen Freud DO   Signed by:   Loreen Freud DO on 02/22/2007   Method used:   Reprint   RxID:   [id number] FLUOXETINE HCL 10 MG  CAPS (FLUOXETINE HCL) 1 daily  #30 x 11   Entered and Authorized by:   Loreen Freud DO   Signed by:   Loreen Freud DO on 02/22/2007   Method used:   Print then Give to Patient   RxID:   [id number] LEVOTHYROXINE SODIUM 112 MCG TABS (LEVOTHYROXINE SODIUM) Take 1 tablet by mouth every other day  #15 x 11   Entered and Authorized by:   Loreen Freud DO   Signed by:   Loreen Freud DO on 02/22/2007   Method used:   Print then Give to Patient   RxID:   [id number] LEVOTHROID 100 MCG   TABS (LEVOTHYROXINE SODIUM) 1 by mouth on Tues, Thurs and Sunday  #15 x 11   Entered and Authorized by:   Loreen Freud DO   Signed by:   Loreen Freud  DO on 02/22/2007   Method used:   Print then Give to Patient   RxID:   [id number]   Appended Document: CPX AND PAP/ALJ  Laboratory Results   Urine Tests  Date/Time Recieved: ..................................................................Marland KitchenFloydene Flock  February 22, 2007 10:04 AM   Routine Urinalysis   Color: yellow Appearance: Clear Glucose: negative   (Normal Range: Negative) Bilirubin: negative   (Normal Range: Negative) Ketone: negative   (Normal Range: Negative) Spec. Gravity: 1.020   (Normal Range: 1.003-1.035) Blood: negative   (Normal Range: Negative) pH: 5.0   (Normal Range: 5.0-8.0) Protein: negative   (Normal Range: Negative) Urobilinogen: negative   (Normal Range: 0-1) Nitrite: negative   (Normal Range: Negative) Leukocyte Esterace: negative   (Normal Range: Negative)        Appended Document: CPX AND PAP/ALJ Ming,  You are anemic--  probably because your periods are so heavy.  Your Thyroid meds need to be increased as well.  This may help the bleeding as well.  Increase the levothyroxine to 125 micrograms  and we will re check labs in 2 months--  also take an Iron supplement--  Slow FE from drugstore---  2a day and we will re check that as well.  If bleeding con't to be heavy---  come in sooner ---  I don't want your hgb to drop anymore.    Dr Laury Axon    244.Marland Kitchen9 TSH  285.9  CBC, ibcpanal, and ferritin

## 2010-09-24 NOTE — Assessment & Plan Note (Signed)
Summary: rov 2 months///kp   Visit Type:  Follow-up Copy to:  Loreen Freud Primary Provider/Referring Provider:  Loreen Freud DO  CC:  Pt here for follow up. Pt states no new complaints or concerns and sleep is the same.  History of Present Illness: 44/F for evaluation of insomnia of sleep maintenance. Her problem seems to be frequent transient arousals leading to excessive daytime fatigue. She has this sleep pattern evr since she can remember. She has symptoms consistent with restlesslegs syndrome - klonopin helped but stopped due to side effects. Tried homeopathic medication for some time. Husband has noted loud snoring over last 5 years, not positional. She has undergone septoplasty, nasal steroids did not help. Palatal injection did not solve this problem. Epworth Sleepiness Score is 5/24, denies excessive daytime somnolence . Naps are non refreshing. Bedtime is 9pm, sleep latency 15 mins, wakes up several times but rarely stays up, vivid dreams, wakes up at 5 am tired, no headaches, 1 cup coffee /day. Recent holter for palpitations was non diagnostic She takes pristiq for anxiety (used to be on prozac) , has not needed alprazolam  February 21, 2010 10:36 AM  PSG showed AHI of 0.7/h but 78 RERAs with TST 366 mins &  RDI of 13/h, arousaL INDEX OF 92 /H with lowest desatn 91%, PLM arousals were 6/h >> s/o upper airway resistance syndrome discussed options for restless legs & UARS labs >> low ferritin  May 06, 2010 3:26 PM  Continues to have frequent awakenings, Took iron tabs x 2 months, Requip kept her awake - took only 1 night - does not want to pursue this again. Does not want meds in general. Frustarted that 'no one can help me'  Preventive Screening-Counseling & Management  Alcohol-Tobacco     Alcohol drinks/day: <1     Alcohol type: spirits     Smoking Status: quit > 6 months     Packs/Day: 1.0     Year Started: 1981     Year Quit: 1997     Pack years: 14  Current  Medications (verified): 1)  Synthroid 112 Mcg Tabs (Levothyroxine Sodium) .Marland Kitchen.. 1 By Mouth Tues, Thurs , Fri, Sat , Sun 2)  Pristiq 50 Mg Xr24h-Tab (Desvenlafaxine Succinate) .Marland Kitchen.. 1 By Mouth Once Daily 3)  Clobetasol Propionate 0.05 % Crea (Clobetasol Propionate) .... Apply As Directed 4)  Potassium Gluconate 595 Mg Tabs (Potassium Gluconate) .... Take 1 Tablet By Mouth Once A Day 5)  Metrogel 1 % Gel (Metronidazole) .... Apply Daily For Rosacea 6)  Lipitor 20 Mg Tabs (Atorvastatin Calcium) .Marland Kitchen.. 1 By Mouth At Bedtime 7)  Synthroid 125 Mcg Tabs (Levothyroxine Sodium) .... 1/2 By Mouth Mw  Allergies (verified): 1)  ! Morphine  Past History:  Past Medical History: Last updated: 01/19/2009 Headache Hypothyroidism Current Problems:  DERMATITIS (ICD-692.9) CONTACT DERMATITIS, RHUS (ICD-692.6) MENORRHAGIA (ICD-626.2) PREVENTIVE HEALTH CARE (ICD-V70.0) CARPAL TUNNEL RELEASE, LEFT, HX OF (ICD-V45.89) CARPAL TUNNEL RELEASE, RIGHT, HX OF (ICD-V45.89) DEVIATED NASAL SEPTUM (ICD-470) CRYOTHERAPY, HX OF,  ABLATION (ICD-V15.9) PREGNANCY, ECTOPIC NEC W/INTRAUTERINE PRG (ICD-633.81) RESTLESS LEG SYNDROME, HX OF (ICD-V12.49) HYPOTHYROIDISM (ICD-244.9) HEADACHE (ICD-784.0) plantar fascitis---Dr Almedia Balls Chest Pain:    Hyperlipidemia  Social History: Last updated: 12/12/2009 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  Quit in 1997.  started at age 52.  1 ppd. Alcohol use-no Drug use-no Regular exercise-yes   Social History: Alcohol drinks/day:  <1  Review of Systems  The patient denies anorexia, fever, weight loss, weight gain, vision loss, decreased  hearing, hoarseness, chest pain, syncope, peripheral edema, prolonged cough, headaches, hemoptysis, abdominal pain, melena, hematochezia, severe indigestion/heartburn, hematuria, muscle weakness, suspicious skin lesions, difficulty walking, depression, unusual weight change, abnormal bleeding, enlarged lymph nodes,  and angioedema.    Vital Signs:  Patient profile:   45 year old female Height:      63 inches Weight:      179.38 pounds BMI:     31.89 O2 Sat:      97 % on Room air Temp:     98.3 degrees F oral Pulse rate:   77 / minute BP sitting:   118 / 84  (left arm) Cuff size:   regular  Vitals Entered By: Zackery Barefoot CMA (May 06, 2010 3:07 PM)  O2 Flow:  Room air CC: Pt here for follow up. Pt states no new complaints or concerns and sleep is the same Comments Medications reviewed with patient Verified contact number and pharmacy with patient Zackery Barefoot CMA  May 06, 2010 3:07 PM    Physical Exam  Additional Exam:  Gen. Pleasant, well-nourished, in no distress, normal affect ENT - no lesions, no post nasal drip, class 2 airway Neck: No JVD, no thyromegaly, no carotid bruits Lungs: no use of accessory muscles, no dullness to percussion, clear without rales or rhonchi  Cardiovascular: Rhythm regular, heart sounds  normal, no murmurs or gallops, no peripheral edema Musculoskeletal: No deformities, no cyanosis or clubbing    Impression & Recommendations:  Problem # 1:  OBSTRUCTIVE SLEEP APNEA (ICD-780.57)  She only has increased upper airway resistance & weight loss is best way forward I doubt that oral appliance or CPAP is indicated here behaviour therapy to consolidate sleep was discussed  -Rules of sleep hygiene were discussed  - light exercise -avoid caffeinated beverages - no more than 20 mins staying awake in bed, if not asleep, get out of bed & reading or light music - No TV or computer games at bedtime.   Orders: Est. Patient Level II (91478)  Problem # 2:  RESTLESS LEG SYNDROME (ICD-333.99)  She has frequent PLMs but does not want ropinirole or other medication. Iron has been supplemented & she does not want to recheck. I doubt that I have much else to offer her  Orders: Est. Patient Level II (29562)  Medications Added to Medication List  This Visit: 1)  Synthroid 125 Mcg Tabs (Levothyroxine sodium) .... 1/2 by mouth mw  Patient Instructions: 1)  Please schedule a follow-up appointment as needed.

## 2010-09-24 NOTE — Progress Notes (Signed)
  Phone Note Outgoing Call   Summary of Call: pt aware,labs mailed, rx sented Initial call taken by: [facility] CMA,  August 14, 2008 5:15 PM    New/Updated Medications: SYNTHROID 125 MCG TABS (LEVOTHYROXINE SODIUM) take 1 tab once daily   Prescriptions: SYNTHROID 125 MCG TABS (LEVOTHYROXINE SODIUM) take 1 tab once daily  #30 x 2   Entered by:   Jeremy Johann CMA   Authorized by:   Loreen Freud DO   Signed by:   Jeremy Johann CMA on 08/14/2008   Method used:   Electronically to        [facility] (retail)       [address]       [place], [place]  [address]       Ph: [phone]       Fax: [phone]   RxID:   [id number]

## 2010-09-24 NOTE — Progress Notes (Signed)
Summary: sleep study  Phone Note Outgoing Call Call back at [facility] Phone [phone]   Call placed by: Army Fossa CMA,  January 30, 2010 12:02 PM Reason for Call: Discuss lab or test results Summary of Call: Pt called and is concerned about her results from her Sleep Study, she never heard anything. It was done on 01/03/10. Please advise. Army Fossa CMA  January 30, 2010 12:03 PM   Follow-up for Phone Call        DR Vassie Loll or his nurse should have called pt---Pulmonary ordered it---  she was supposed to make f/u visit with Dr Vassie Loll 2 weeks after test Follow-up by: Loreen Freud DO,  January 30, 2010 12:16 PM  Additional Follow-up for Phone Call Additional follow up Details #1::        Pt is aware. Army Fossa CMA  January 30, 2010 1:13 PM

## 2010-09-24 NOTE — Letter (Signed)
Summary: Primary Care Consult Scheduled Letter  [facility] [place]  [address] [place], [place] [address]   Phone: [phone]  Fax: [phone]      10/29/2009 MRN: [id number]  [facility] [address] [place], [place]  [address]    Dear Ms. Renato Gails,    We have scheduled an appointment for you.  At the recommendation of Dr. Loreen Freud, we have scheduled you a consult with Dr. Cyril Mourning of [facility] on 11-14-2009 at 2:30pm.  Their address is [address] floor, [place] [address]. The office phone number is [phone].  If this appointment day and time is not convenient for you, please feel free to call the office of the doctor you are being referred to at the number listed above and reschedule the appointment.    It is important for you to keep your scheduled appointments. We are here to make sure you are given good patient care.   Thank you,    Reneee, Patient Care Coordinator [facility] [place]    **IF YOU ARE UNABLE TO KEEP THIS APPOINTMENT, OR NEED TO RESCHEDULE, PLEASE GIVE A 24 HOUR NOTICE TO AVOID A $50 FEE**

## 2010-09-24 NOTE — Procedures (Signed)
Summary: EGD   EGD  Procedure date:  10/27/2008  Findings:      Location: [facility]    ENDOSCOPY PROCEDURE REPORT  PATIENT:  Katie [name], Katie [name]  MR#:  [id number] BIRTHDATE:   May 24, 1966   GENDER:   female  ENDOSCOPIST:   Rachael Fee, MD Referred by: Loreen Freud, DO  PROCEDURE DATE:  10/27/2008 PROCEDURE:  EGD, diagnostic ASA CLASS:   Class II INDICATIONS: chest pain, intermittent epigatric pain  MEDICATIONS:   Fentanyl 50 mcg IV, Versed 5 mg IV TOPICAL ANESTHETIC:   Exactacain Spray  DESCRIPTION OF PROCEDURE:   After the risks benefits and alternatives of the procedure were thoroughly explained, informed consent was obtained.  The [facility] [id number] endoscope was introduced through the mouth and advanced to the second portion of the duodenum, without limitations.  The instrument was slowly withdrawn as the mucosa was fully examined. <<PROCEDUREIMAGES>>        <<OLD IMAGES>>  The upper, middle, and distal third of the esophagus were carefully inspected and no abnormalities were noted. The z-line was well seen at the GEJ. The endoscope was pushed into the fundus which was normal including a retroflexed view. The antrum,gastric body, first and second part of the duodenum were unremarkable (see image1, image2, image3, image4, and image5).    Retroflexed views revealed no abnormalities.    The scope was then withdrawn from the patient and the procedure completed.  COMPLICATIONS:   None  ENDOSCOPIC IMPRESSION:  1) Normal EGD  No explanation for her (daily) chest pains.  RECOMMENDATIONS:  My office will arrange for you to have an esophageal manometry test. This is a test of the muscles of your esophagus and is done at [facility] endoscopy unit.  You will not require sedation for the procedure.  For now, continue your once daily antiacid medicine (nexium).    _______________________________ Rachael Fee, MD  CC: Loreen Freud, DO

## 2010-09-24 NOTE — Consult Note (Signed)
Summary: [facility]  [facility]   Imported By: Lanelle Bal 06/07/2010 08:18:53  _____________________________________________________________________  External Attachment:    Type:   Image     Comment:   External Document

## 2010-09-24 NOTE — Progress Notes (Signed)
Summary: Lab results   Phone Note Outgoing Call   Call placed by: Army Fossa CMA,  February 07, 2010 1:28 PM Reason for Call: Discuss lab or test results Summary of Call: Regarding lab results, LMTCB:  change to lipitor 20 mg #30 1 by mouth at bedtime , 2 refills---- give copay card recheck 3 months---NMR, hep 272.4 Signed by Loreen Freud DO on 02/07/2010 at 12:19 PM  Follow-up for Phone Call        Patient is aware of lab results..is coming to pick uo copay card and make lab appt.  Send new rx to [facility] on [address] Follow-up by: Harold Barban,  February 07, 2010 1:39 PM    New/Updated Medications: LIPITOR 20 MG TABS (ATORVASTATIN CALCIUM) 1 by mouth at bedtime Prescriptions: LIPITOR 20 MG TABS (ATORVASTATIN CALCIUM) 1 by mouth at bedtime  #30 x 2   Entered by:   Army Fossa CMA   Authorized by:   Loreen Freud DO   Signed by:   Army Fossa CMA on 02/07/2010   Method used:   Electronically to        [facility] (retail)       [address]       [place], [place]  [address]       Ph: [phone]       Fax: [phone]   RxID:   [id number]

## 2010-09-24 NOTE — Procedures (Signed)
Summary: Esophageal Manometry/[facility]  Esophageal Manometry/[facility]   Imported By: Sherian Rein 01/25/2009 10:53:17  _____________________________________________________________________  External Attachment:    Type:   Image     Comment:   External Document

## 2010-09-24 NOTE — Letter (Signed)
Summary: Results Follow-up Letter  [facility]  [address] [place], [place] [address]   Phone: [phone]  Fax: [phone]    09/22/2008        Katie [name] [address] [place], [place]  [address]  Dear Ms. MENEES,   The following are the results of your recent test(s):  Test     Result     Pap Smear    Normal_______  Not Normal_____       Comments: _________________________________________________________ Cholesterol LDL(Bad cholesterol):          Your goal is less than:         HDL (Good cholesterol):        Your goal is more than: _________________________________________________________ Other Tests:   _________________________________________________________  Please call for an appointment Or Please see attached labs._________________________________________________________ _________________________________________________________ _________________________________________________________  Sincerely,  Felecia Deloach CMA  at [facility]

## 2010-09-24 NOTE — Assessment & Plan Note (Signed)
Summary: rash,itchy/alr k per chrae   Vital Signs:  Patient Profile:   45 Years Old Female Weight:      171 pounds Temp:     98.4 degrees F oral Pulse rate:   72 / minute BP sitting:   110 / 78  (left arm)  Vitals Entered By: Doristine Devoid (July 12, 2007 3:14 PM)                 PCP:  Laury Axon  Chief Complaint:  RASH ON LOWER BACK X1 WEEK VERY ITCHY USE GOLDB[place] AND AVE[place] WITH SOME RELIEF BUT HAS NOT CLEARED UP.  and Rash.  History of Present Illness: Pt here with rash between breasts and Low back --- itching  Rash      This is a 45 year old woman who presents with Rash.  The patient reports macules, but denies papules, nodules, hives, welts, pustules, blisters, ulcers, itching, scaling, weeping, oozing, redness, increased warmth, and tenderness.  The rash is located on the chest and back.  The rash is better with OTC creams.  The patient denies the following symptoms: fever, headache, facial swelling, tongue swelling, burning, difficulty breathing, abdominal pain, nausea, vomiting, diarrhea, dizziness, sore throat, dysuria, eye symptoms, arthralgias, and vaginal discharge.  The patient denies history of recent tick bite, recent tick exposure, other insect bite, recent infection, recent antibiotic use, new medication, new clothing, new topical exposure, recent travel, pet/animal contact, thyroid disease, chronic liver disease, autoimmune disease, chronic edema, and prior STD.         Review of Systems      See HPI   Physical Exam  General:     Well-developed,well-nourished,in no acute distress; alert,appropriate and cooperative throughout examination Skin:     macule/papule low back-- small area about 1 1/2 in long and about 1 inch wide.     Impression & Recommendations:  Problem # 1:  CONTACT DERMATITIS, RHUS (ICD-692.6)  Her updated medication list for this problem includes:    Elocon 0.1 % Crea (Mometasone furoate) .Marland Kitchen... Apply  once daily Discussed avoidance  of triggers and symptomatic treatment.   Complete Medication List: 1)  Levothyroxine Sodium 112 Mcg Tabs (Levothyroxine sodium) .... Take 1 tablet by mouth every other day 2)  Fluoxetine Hcl 10 Mg Caps (Fluoxetine hcl) .Marland Kitchen.. 1 daily 3)  Levothroid 100 Mcg Tabs (Levothyroxine sodium) .Marland Kitchen.. 1 by mouth on tues, thurs and sunday 4)  Zocor 40 Mg Tabs (Simvastatin) .... 1 by mouth once daily 5)  Elocon 0.1 % Crea (Mometasone furoate) .... Apply  once daily     Prescriptions: ELOCON 0.1 %  CREA (MOMETASONE FUROATE) apply  once daily  #30 g x 3   Entered and Authorized by:   Yvonne Lowne DO   Signed by:   Yvonne Lowne DO on 07/12/2007   Method used:   Electronically sent to ...       [facility] [address] [id number]       [address]       [place], [place]  [address]       Ph: [phone]       Fax: [phone]   RxID:   [id number]  ]

## 2010-09-26 NOTE — Progress Notes (Signed)
Summary: Migraine and needs meds   Phone Note Call from Patient   Caller: Patient Call For: Loreen Freud DO Details for Reason: Headache Summary of Call: Call from patient stated she is having a migraine and needs an Rx called to the pharmacy, stated she has had injectable imitrex in the past, but she is all out. No migraine meds on med list..... Hx of headache is on problem list. c/b # [id number]. Please advise on whether the patient can have something for her Migraine. Initial call taken by: Almeta Monas CMA Duncan Dull),  August 20, 2010 3:11 PM  Follow-up for Phone Call        per chart review, no history of migraines per se, we have not  prescribed Imitrex ( at least not since 2008): --needs office visit --if symptoms severe, needs to go to urgent care or ER Follow-up by: [facility] E. Paz MD,  August 20, 2010 5:24 PM  Additional Follow-up for Phone Call Additional follow up Details #1::        spoke with patient and advised her of the above. I offered and OV an dshe declined, she wanted to know when Dr.Lowne was coming back, I advised her possibly friday, she stated she would wait until then. I advised her i can schedule her with another provider, pt declined, I advised if her pain get severe to call us back or go the the ED or Urgent care. she voiced understanding.... Almeta Monas CMA Duncan Dull)  August 21, 2010 8:11 AM

## 2010-11-15 ENCOUNTER — Other Ambulatory Visit: Payer: Self-pay | Admitting: Family Medicine

## 2010-11-15 NOTE — Telephone Encounter (Signed)
Last seen 04/18/10 and filled 09/09/10 please advise...Marland KitchenMarland KitchenMarland Kitchen KP

## 2010-11-25 ENCOUNTER — Other Ambulatory Visit: Payer: Self-pay | Admitting: Family Medicine

## 2010-12-20 ENCOUNTER — Encounter: Payer: Self-pay | Admitting: Family Medicine

## 2010-12-27 ENCOUNTER — Other Ambulatory Visit: Payer: Self-pay | Admitting: Family Medicine

## 2011-01-07 NOTE — Assessment & Plan Note (Signed)
[facility]                            CARDIOLOGY OFFICE NOTE   Katie [name], Katie [name]                       MRN:          [id number]  DATE:07/27/2008                            DOB:          [birth year]    Katie [name] is a 45 year old patient referred by Dr. Laury Axon for chest pain.  The pain is very atypical.  It has been going on for about a month.  It  is central over her breast bone.  It sounds like Tietze syndrome.  There  was pain to palpation when Dr. Laury Axon examined there.  There is a  question of some reflux and Zegerid has helped with the pain somewhat.  It is nonexertional.  It can occur any time.  When she gets it, it lasts  30 seconds or less; however, it is persistent.   The patient does not recall injuring herself.  There has been no history  of arthritides, lupus or connective tissue disease.   I do not have any recent lab work on the patient.   The pain is not associated with shortness of breath, diaphoresis,  palpitations, or syncope.   Her review of systems is otherwise unremarkable.   Past medical history is remarkable for some dermatitis, menorrhagia,  carpal tunnel release on the left and right, history of deviated septum  surgery, history of ectopic pregnancy, history of hypothyroidism on  replacement since 1998.  She also has had cervical ablation in 2004.   The patient is married.  She has 2 children, and one of her sons still  lives with her, and has a pregnant 32 year old girlfriend.   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  She does  not smoke.  She quit in 1997.  She does not drink.   She walks 15-20 minutes a day.   Family history is unfortunately remarkable for mother dying of an  aneurysm at age 54.  Father committing suicide at the age of 36.  The  patient has been on her own since age 24.   CURRENT MEDICATIONS:  1. Synthroid 112 mcg a day.  2. Prozac 10 a day.  3. Zegerid 40 a day.  4.  Clotrimazole cream for eczema.  5. Over-the-counter Claritin as needed.   PHYSICAL EXAMINATION:  GENERAL:  Remarkable for a healthy appearing  female in no distress.  Her weight is 175, her blood pressure 130/80,  pulse 80 and regular, respiratory rate 14, and afebrile.  HEENT:  Unremarkable.  NECK:  Carotids are normal without bruit.  No lymphadenopathy,  thyromegaly, or JVP elevation.  LUNGS:  Clear to diaphragmatic motion.  No wheezing.  HEART:  S1 and S2 with normal heart sounds.  No pain to palpation over  the sternum.  PMI normal.  ABDOMEN:  Benign.  Bowel sounds positive.  No AAA, no bruit, no  hepatosplenomegaly, no hepatojugular reflux, no tenderness.  EXTREMITIES:  Pulses are intact.  No edema.  NEURO:  Nonfocal.  SKIN:  Warm and dry.  No muscular weakness.   EKG shows sinus rhythm with nonspecific ST-T wave changes.  IMPRESSION:  1. Atypical chest pain with an abnormal EKG.  Followup stress echo.  2. 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  Consider Aleve and/or nonsteroidal 6      weeks.  Apply moist heat to the breast bone 30 minutes at night.  3. Anxiety/depression.  Continue current dose of SSRI, likely related      to son's issues with his pregnant girlfriend.  4. Hypothyroidism.  Continue replacement as long as the patient's      stress echo is normal.  I will see her on an as-needed basis.     Noralyn Pick. Eden Emms, MD, [facility]  Electronically Signed    PCN/MedQ  DD: 07/27/2008  DT: 07/28/2008  Job #: [id number]   cc:   Lelon Perla, DO

## 2011-01-07 NOTE — H&P (Signed)
NAME:  Katie [name], Katie [name]                ACCOUNT NO.:  [id number]   MEDICAL RECORD NO.:  [id number]          PATIENT TYPE:  AMB   LOCATION:  [facility]                         FACILITY:  [facility]   PHYSICIAN:  Juan H. Lily Peer, M.D.DATE OF BIRTH:  [birth year]   DATE OF ADMISSION:  DATE OF DISCHARGE:                              HISTORY & PHYSICAL   The patient is scheduled for surgery on Thursday, August 05, 2007, at  1:00 p.m. at [facility] please have history and physical  available.   CHIEF COMPLAINT:  Menometrorrhagia.   HISTORY OF PRESENT ILLNESS:  The patient is a 45 year old gravida 4,  para 2, and AB 2 (one miscarriage and one ectopic) who had been seen in  the office on March 22, 2007, secondary to menometrorrhagia and urge  incontinence.  She had undergone endometrial biopsy in March 22, 2007,  which demonstrated early secretory benign endometrium with no  hyperplasia or malignancy.  She had a normal sonohysterogram and an  ultrasound did demonstrate a small fundal fibroid measuring less than  2.5 cm.  The patient's record indicated that she had had an endometrial  ablation via cryotechnique back in 2004, but she has continued to have  heavy bleeding and lasting for several days.  We gave her the options of  reablating her in the office with a cryoablation technique or proceeding  with an outpatient procedure of endomyoresection technique and  rollerball to more aggressively ablate the endometrium, which she agreed  and she would like to proceed with this.  She had been placed on  Prometrium 200 mg starting from day #3 of her cycle.  Her urodynamic  evaluation demonstrated that she had what appears to be an overactive  bladder and had been started on Detrol 4 mg to take daily.   PAST MEDICAL HISTORY:  She denies any allergies.  She has had two normal  spontaneous vaginal deliveries.  She has a history of hypothyroidism, on  levofloxacin 112 mcg daily.  Her previous  operations consisted of  laparoscopic tubal ligation in the year 2000.  She has had linear  salpingostomy several years ago, she does not recall at which side.  Also has had carpal tunnel release on both wrists and cyroendometrial  ablation in 2004.   FAMILY HISTORY:  Significant for diabetes in her grandmother,  hypertension in grandparents, and grandmother with ovarian cancer, and  grandfather with lung cancer  as well as skin cancer.   PHYSICAL EXAMINATION:  VITAL SIGNS:  The patient weighs 168 pounds.  Blood pressure is 120/78.  She is 5 feet and 3.5 inches inch tall.  HEENT:  Unremarkable.  NECK:  Supple.  Trachea midline.  No carotid bruits or thyromegaly.  LUNGS: Clear to auscultation without rhonchi or wheezes.  HEART:  Regular rate and rhythm with no murmur or gallops.  BREAST EXAM:  Not done.  ABDOMEN:  Soft and nontender without rebound or guarding.  PELVIC:  Bartholin, urethra, are Skene's glands are within normal  limits.  VAGINA AND CERVIX:  No lesion or discharge.  Uterus is anteverted,  normal  size, shape, and consistency.  Adnexa with no mass or tenderness.  RECTAL EXAM:  Deferred.   ASSESSMENT:  A 45 year old gravida 4, para 2, and AB 2 with  menometrorrhagia, failed cryoendometrial ablation in 2004.  She has had  extensive evaluation consisting of sonohysterogram and endometrial  biopsy here in the office where all were normal.  She is scheduled to  undergo endomyoresection and rollerball ablation [facility].  The risks, benefits, and pros and cons of  procedure were discussed.  She will be seen in the office the day prior  to her surgery for placement of a laminaria intracervically and to go  over the details of the operation once again.   PLAN:  The patient is scheduled for rollerball endometrial ablation on  Thursday, August 05, 2007, at 1:00 p.m. at [facility].  Please have history and physical  available.      Juan H. Lily Peer, M.D.  Electronically Signed     JHF/MEDQ  D:  07/29/2007  T:  07/30/2007  Job:  [id number]

## 2011-01-07 NOTE — Op Note (Signed)
NAME:  Katie [name], Katie [name]                ACCOUNT NO.:  [id number]   MEDICAL RECORD NO.:  [id number]          PATIENT TYPE:  AMB   LOCATION:  [facility]                         FACILITY:  [facility]   PHYSICIAN:  Juan H. Lily Peer, M.D.DATE OF BIRTH:  1966-03-08   DATE OF PROCEDURE:  DATE OF DISCHARGE:                               OPERATIVE REPORT   SURGEON:  Juan H. Lily Peer, MD   INDICATION FOR OPERATION:  A 45 year old gravida 4, para 2, AB 2 (1  miscarriage and 1 ectopic) with menometrorrhagia and prior history of  cryoablation for menometrorrhagia.  The patient had a negative  sonohysterogram and negative endometrial biopsy and had been on  Prometrium 300 mg daily prior to the procedure for priming of the  endometrium, and she is scheduled for a roller-ball endometrial  ablation.   PREOPERATIVE DIAGNOSIS:  Menometrorrhagia.   POSTOPERATIVE DIAGNOSIS:  Menometrorrhagia.   ANESTHESIA:  General endotracheal anesthesia.   PROCEDURE PERFORMED:  Roller-bar and roller-ball endometrial ablation.   FINDINGS:  Normal intrauterine cavity.  Both tubal ostia identified.  Smooth endocervical canal.   DESCRIPTION OF OPERATION:  After the patient was adequately counseled,  she was taken to the operating room where she underwent a successful  general endotracheal anesthesia.  The patient received a gram of  cefoxitin prophylactically.  She was placed in the low lithotomy  position.  The abdomen, vagina, and perineum were prepped and draped in  the usual sterile fashion after the laminaria that had been placed the  day prior intracervically was removed, thus requiring minimal dilatation  of the cervix.  The anterior cervical lip was grasped with a single-  tooth tenaculum.  The uterus sounded to approximately 8 cm.  The Lendell Caprice operative resectoscope with a roller-ball attached was inserted  into the intrauterine cavity.  Distending media was 3% sorbitol.  The  electrical surgical generator settings  were 80 watts on the cutting mode  100 watts on the coagulation mode.  The roller-ball was utilized to  ablate near the cornual region of both tubes, and then the operating  element was changed to a roller-bar, and the endometrium was ablated for  2-3 mm in a paintbrush like technique incorporating the entire uterine  cavity up to the level of the internal os.  The instruments were  removed.  Fluid deficit was a 100 mL and IV fluids 9 mL of lactated  Ringer's.  Of note, in and out cath had been done before the procedure  and approximately 50 mL were drained from her bladder.  Also, she had  had a bimanual examination which confirmed the slightly retroverted  uterus.  The patient was extubated and then transferred to the recovery  room.      Juan H. Lily Peer, M.D.  Electronically Signed    JHF/MEDQ  D:  08/05/2007  T:  08/06/2007  Job:  [id number]

## 2011-01-08 ENCOUNTER — Encounter: Payer: Self-pay | Admitting: Family Medicine

## 2011-01-10 NOTE — Op Note (Signed)
NAME:  Katie [name], Katie [name]                          ACCOUNT NO.:  [id number]   MEDICAL RECORD NO.:  [id number]                   PATIENT TYPE:  AMB   LOCATION:  [facility]                                  FACILITY:  [facility]   PHYSICIAN:  Harvie Junior, M.D.                DATE OF BIRTH:  11-Mar-1966   DATE OF PROCEDURE:  08/12/2002  DATE OF DISCHARGE:                                 OPERATIVE REPORT   PREOPERATIVE DIAGNOSES:  Carpal tunnel syndrome, left.   POSTOPERATIVE DIAGNOSES:  Carpal tunnel syndrome, left.   PROCEDURE:  Left carpal tunnel release.   SURGEON:  Harvie Junior, M.D.   ANESTHESIA:  __________ IV regional.   BRIEF HISTORY:  The patient is [name] 45 year old female with [name] long history of  having carpal tunnel symptoms on her left side.  She had it actually  bilaterally.  Ultimately, she had [name] right carpal tunnel release done.  She  did well with that, and ultimately, because of continued complaints of  numbness, tingling, and pain in the left hand and failure of conservative  care, she was brought to the operating room for carpal tunnel release.   PROCEDURE:  The patient was brought to the operating.  After adequate  anesthesia was obtained with IV regional, the patient was placed on the  operating table. The left arm was then prepped and draped in the usual  sterile fashion.  Following this, an incision was made just ulnar to the  midline wrist crease.  The subcutaneous tissue was dissected down to the  level of the volar carpal ligament which was clearly identified and divided.  [name] Freer elevator was placed through the rent in the ligament to make sure  that the nerve was not adherent proximally or distally.  The ligament was  then divided both proximally and distally, and once this was undertaken, [name]  gloved finger could be placed in the wound proximally and distally.  The  wound was then copiously irrigated and suctioned dry.  The median nerve was  checked to make sure that  the motor branch was freed up and that there was  no evidence of abnormality.  At this point, the wound was copiously  irrigated and suctioned dry.  The skin was then closed with [name] combination of  interrupted and running 4-0 nylon suture.  [name] sterile compressive dressing  was applied.  The patient was taken to the recovery room where she was noted  to be in satisfactory condition.  The estimated blood loss for this  procedure was not significant.                                               Harvie Junior, M.D.    Ranae Plumber  D:  08/12/2002  T:  08/13/2002  Job:  [id number]

## 2011-01-10 NOTE — Op Note (Signed)
NAME:  Katie [name], Katie [name]                          ACCOUNT NO.:  [id number]   MEDICAL RECORD NO.:  [id number]                   PATIENT TYPE:  AMB   LOCATION:  [facility]                                  FACILITY:  [facility]   PHYSICIAN:  Suzanna Obey, M.D.                    DATE OF BIRTH:  07/14/1966   DATE OF PROCEDURE:  02/08/2004  DATE OF DISCHARGE:                                 OPERATIVE REPORT   PREOPERATIVE DIAGNOSIS:  Deviated septum, turbinate hypertrophy, chronic  sinusitis.   POSTOPERATIVE DIAGNOSIS:  Deviated septum, turbinate hypertrophy, chronic  sinusitis.   SURGICAL PROCEDURE:  Septoplasty, submucous resection of inferior  turbinates, bilateral maxillary antrostomy, anterior ethmoidectomy.   ANESTHESIA:  General endotracheal tube.   ESTIMATED BLOOD LOSS:  Approximately 25 mL.   INDICATIONS FOR PROCEDURE:  This is a 45 year old who has had chronic  repetitive sinus pressure and episodes of sinusitis.  She has chronic nasal  obstruction.  She has failed medical therapy.  She was informed of the risks  and benefits of the procedure including bleeding, infection, perforation of  the septum, change in the external appearance of the nose, numbness of the  teeth, chronic crusting and drying, CSF leak, blindness, change in the sense  of smell, scarring of the sinuses, and risks of the anesthetic.  All  questions were answered and consent was obtained.   OPERATION:  The patient was taken to the operating room and placed in the  supine position.  After adequate general endotracheal anesthesia, was  prepped and draped in the usual sterile fashion.  Oxymetazoline pledgets  were placed into the nose bilaterally and then the septum, inferior  turbinates, and middle turbinates were injected with 1% lidocaine with  1:100,000 epinephrine.  A right hemitransfixion incision was performed  raising a mucoperichondrial and ostial flap.  The cartilage was divided  about 2 cm posterior to the  caudal strut and the deviated portion of the  cartilage was removed.  The opposite flap was elevated and the septum was  removed with the Jansen-Middleton forceps.  There was an inferior  cartilaginous spur that was removed.  This corrected the septal deflection.  The left antrostomy was performed, opening the antrostomy widely by  retrograde fashion with the back biting forceps.  The uncinate was removed  with the microdebrider as well as opening up the antrostomy further with the  microdebrider.  The uncinate was removed up to its attachment around the  middle turbinate and then the ethmoid bulla was opened.  The ethmoids were  opened up into the nasal frontal duct and they were somewhat thickened  mucosa.  A pledget was placed and the right side was repeated in a similar  fashion.  The uncinate was removed and microdebrided away.  The antrostomy  was opened widely.  There was some fluid in the maxillary sinus which was  suctioned.  The tissue was much thicker on this side.  The ethmoid mucosa  was actually very thick.  There was no polyps.  The anterior and nasal  frontal region were opened.  The oxymetazoline pledgets were placed into  both ethmoid cavities and good hemostasis was achieved.  The turbinates were  infractured, a midline incision was made with a 15 blade, and the mucosal  flap was elevated superior and the inferior mucosa and bone were removed  with the turbinate scissors.  The edge was cauterized with suction cautery  and both flaps were laid back down over the raw surface and both turbinates  were out fractured.  The hemitransfixion incision was closed with  interrupted 4-0 chromic and 4-0 plain  gut quilting stitch was placed to the septum.  Telfa rolls soaked in  Bacitracin were placed into the nose bilaterally and secured with 3-0 nylon.  The oral cavity and oropharynx were suctioned out of all blood and debris  under direct visualization.  The patient was awakened  and brought to the  recovery room in stable condition.  Counts were correct.                                               Suzanna Obey, M.D.    Cordelia Pen  D:  02/08/2004  T:  02/08/2004  Job:  [id number]

## 2011-01-14 ENCOUNTER — Ambulatory Visit (INDEPENDENT_AMBULATORY_CARE_PROVIDER_SITE_OTHER): Payer: BC Managed Care – PPO | Admitting: Family Medicine

## 2011-01-14 ENCOUNTER — Encounter: Payer: Self-pay | Admitting: Family Medicine

## 2011-01-14 ENCOUNTER — Other Ambulatory Visit (HOSPITAL_COMMUNITY)
Admission: RE | Admit: 2011-01-14 | Discharge: 2011-01-14 | Disposition: A | Discharge status: Home / Self Care | Payer: BC Managed Care – PPO | Source: Ambulatory Visit | Attending: Family Medicine | Admitting: Family Medicine

## 2011-01-14 ENCOUNTER — Other Ambulatory Visit: Payer: Self-pay

## 2011-01-14 VITALS — BP 124/76 | HR 70 | Temp 97.4°F | Ht 64.0 in | Wt 179.4 lb

## 2011-01-14 DIAGNOSIS — Z01419 Encounter for gynecological examination (general) (routine) without abnormal findings: Secondary | ICD-10-CM | POA: Insufficient documentation

## 2011-01-14 DIAGNOSIS — G4700 Insomnia, unspecified: Secondary | ICD-10-CM

## 2011-01-14 DIAGNOSIS — Z0000 Encounter for general adult medical examination without abnormal findings: Secondary | ICD-10-CM

## 2011-01-14 DIAGNOSIS — E039 Hypothyroidism, unspecified: Secondary | ICD-10-CM

## 2011-01-14 LAB — CBC WITH DIFFERENTIAL/PLATELET
Basophils Absolute: 0 10*3/uL (ref 0.0–0.1)
Basophils Relative: 0.6 % (ref 0.0–3.0)
Eosinophils Absolute: 0.1 10*3/uL (ref 0.0–0.7)
Eosinophils Relative: 2.3 % (ref 0.0–5.0)
HCT: 42.3 % (ref 36.0–46.0)
Hemoglobin: 14.6 g/dL (ref 12.0–15.0)
Lymphocytes Relative: 39.1 % (ref 12.0–46.0)
Lymphs Abs: 1.9 10*3/uL (ref 0.7–4.0)
MCHC: 34.6 g/dL (ref 30.0–36.0)
MCV: 89.8 fl (ref 78.0–100.0)
Monocytes Absolute: 0.3 10*3/uL (ref 0.1–1.0)
Monocytes Relative: 6 % (ref 3.0–12.0)
Neutro Abs: 2.5 10*3/uL (ref 1.4–7.7)
Neutrophils Relative %: 52 % (ref 43.0–77.0)
Platelets: 250 10*3/uL (ref 150.0–400.0)
RBC: 4.71 Mil/uL (ref 3.87–5.11)
RDW: 12.5 % (ref 11.5–14.6)
WBC: 4.9 10*3/uL (ref 4.5–10.5)

## 2011-01-14 LAB — POCT URINALYSIS DIPSTICK
Bilirubin, UA: NEGATIVE
Blood, UA: NEGATIVE
Glucose, UA: NEGATIVE
Ketones, UA: NEGATIVE
Leukocytes, UA: NEGATIVE
Nitrite, UA: NEGATIVE
Protein, UA: NEGATIVE
Spec Grav, UA: 1.005
Urobilinogen, UA: 0.2
pH, UA: 6.5

## 2011-01-14 LAB — HEPATIC FUNCTION PANEL
ALT: 29 U/L (ref 0–35)
AST: 27 U/L (ref 0–37)
Albumin: 4.3 g/dL (ref 3.5–5.2)
Alkaline Phosphatase: 65 U/L (ref 39–117)
Bilirubin, Direct: 0.1 mg/dL (ref 0.0–0.3)
Total Bilirubin: 0.9 mg/dL (ref 0.3–1.2)
Total Protein: 7.4 g/dL (ref 6.0–8.3)

## 2011-01-14 LAB — LIPID PANEL
Cholesterol: 170 mg/dL (ref 0–200)
HDL: 53.2 mg/dL (ref >39.00)
LDL Cholesterol: 77 mg/dL (ref 0–99)
Total CHOL/HDL Ratio: 3
Triglycerides: 199 mg/dL — ABNORMAL HIGH (ref 0.0–149.0)
VLDL: 39.8 mg/dL (ref 0.0–40.0)

## 2011-01-14 LAB — BASIC METABOLIC PANEL
BUN: 11 mg/dL (ref 6–23)
CO2: 29 mEq/L (ref 19–32)
Calcium: 9.2 mg/dL (ref 8.4–10.5)
Chloride: 102 mEq/L (ref 96–112)
Creatinine, Ser: 0.8 mg/dL (ref 0.4–1.2)
GFR: 85.78 mL/min (ref >60.00)
Glucose, Bld: 89 mg/dL (ref 70–99)
Potassium: 4.4 mEq/L (ref 3.5–5.1)
Sodium: 138 mEq/L (ref 135–145)

## 2011-01-14 LAB — TSH: TSH: 9.5 u[IU]/mL — ABNORMAL HIGH (ref 0.35–5.50)

## 2011-01-14 MED ORDER — LEVOTHYROXINE SODIUM 112 MCG PO TABS: 112.0000 ug | ORAL_TABLET | ORAL | Status: DC

## 2011-01-14 MED ORDER — MELATONIN 5 MG PO TABS: ORAL_TABLET | ORAL | Status: DC

## 2011-01-14 NOTE — Progress Notes (Signed)
Subjective:     Katie [name] is a 45 y.o. female and is here for a comprehensive physical exam. The patient reports problems - light periods but regular.   Pt had ablation 2 years ago--Dr Lily Peer. Pt will schedule her mammogram.  Pt sees Dr Danella Deis for psoriasis.  History   Social History  . Marital Status: Married    Spouse Name: N/A    Number of Children: 2  . Years of Education: 15   Occupational History  . 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 Smoking status: Former Smoker -- 1.0 packs/day    Quit date: 01/14/1995  . Smokeless tobacco: Former Neurosurgeon    Quit date: 08/26/1995   Comment: started smoking at age 28 1ppd  . Alcohol Use: 0.5 oz/week    1 drink(s) per week  . Drug Use: No  . Sexually Active: Yes -- Female partner(s)   Other Topics Concern  . Not on file   Social History Narrative  . No narrative on file   Health Maintenance  Topic Date Due  . Pap Smear  10/29/2012  . Tetanus/tdap  06/20/2018    The following portions of the patient's history were reviewed and updated as appropriate: allergies, current medications, past family history, past medical history, past social history, past surgical history and problem list.  Review of Systems Review of Systems  Constitutional: Negative for activity change, appetite change and fatigue.  HENT: Negative for hearing loss, congestion, tinnitus and ear discharge.  dentist q34m Eyes: Negative for visual disturbance (see optho q1y -- vision corrected to 20/20 with glasses).  Respiratory: Negative for cough, chest tightness and shortness of breath.   Cardiovascular: Negative for chest pain, palpitations and leg swelling.  Gastrointestinal: Negative for abdominal pain, diarrhea, constipation and abdominal distention.  Genitourinary: Negative for urgency, frequency, decreased urine volume and difficulty urinating.  Musculoskeletal: Negative for back pain, arthralgias and gait problem.  Skin: Negative  for color change, pallor and rash.  Neurological: Negative for dizziness, light-headedness, numbness and headaches.  Hematological: Negative for adenopathy. Does not bruise/bleed easily.  Psychiatric/Behavioral: Negative for suicidal ideas, confusion, sleep disturbance, self-injury, dysphoric mood, decreased concentration and agitation.      Objective:    BP 124/76  Pulse 70  Temp(Src) 97.4 F (36.3 C) (Oral)  Ht 5\' 4"  (1.626 m)  Wt 179 lb 6.4 oz (81.375 kg)  BMI 30.79 kg/m2  SpO2 99%  LMP 01/09/2011 General appearance: alert, cooperative, appears stated age and no distress Head: Normocephalic, without obvious abnormality, atraumatic Eyes: conjunctivae/corneas clear. PERRL, EOM's intact. Fundi benign. Ears: normal TM's and external ear canals both ears Nose: Nares normal. Septum midline. Mucosa normal. No drainage or sinus tenderness. Throat: lips, mucosa, and tongue normal; teeth and gums normal Neck: no adenopathy, no carotid bruit, no JVD, supple, symmetrical, trachea midline and thyroid not enlarged, symmetric, no tenderness/mass/nodules Lungs: clear to auscultation bilaterally Breasts: normal appearance, no masses or tenderness Heart: regular rate and rhythm, S1, S2 normal, no murmur, click, rub or gallop Abdomen: soft, non-tender; bowel sounds normal; no masses,  no organomegaly Pelvic: cervix normal in appearance, external genitalia normal, no adnexal masses or tenderness, no cervical motion tenderness, rectovaginal septum normal, uterus normal size, shape, and consistency and vagina normal without discharge Extremities: extremities normal, atraumatic, no cyanosis or edema Pulses: 2+ and symmetric Skin: Skin color, texture, turgor normal. No rashes or lesions Lymph nodes: Cervical, supraclavicular, and axillary nodes normal. Neurologic: Grossly normal  Assessment:    Healthy female exam.   insomnia Hypothyroid psoriasis Plan:  ghm utd Check fasting labs Pt will  schedule mammogram Refer to neuro for insomnia Psoriasis--per derm   See After Visit Summary for Counseling Recommendation

## 2011-01-14 NOTE — Patient Instructions (Signed)

## 2011-01-15 ENCOUNTER — Telehealth: Payer: Self-pay

## 2011-01-15 DIAGNOSIS — E039 Hypothyroidism, unspecified: Secondary | ICD-10-CM

## 2011-01-15 MED ORDER — FENOFIBRATE 160 MG PO TABS: 160.0000 mg | ORAL_TABLET | Freq: Every day | ORAL | Status: DC

## 2011-01-15 MED ORDER — LEVOTHYROXINE SODIUM 112 MCG PO TABS: ORAL_TABLET | ORAL | Status: DC

## 2011-01-15 NOTE — Telephone Encounter (Signed)
Discussed results with patient--Rx faxed to pharmacy and copy of labs mailed to patient      KP

## 2011-01-15 NOTE — Telephone Encounter (Signed)
Message copied by Almeta Monas on Wed Jan 15, 2011 12:30 PM ------      Message from: Loreen Freud      Created: Tue Jan 14, 2011  9:28 PM       Hypothyroid----take every day #30  2 refills---recheck 2 months   TSH  244.9      Cholesterol--- LDL goal < 100,  HDL >40,  TG < 150.  Diet and exercise will increase HDL and decrease LDL and TG.  Fish,  Fish Oil, Flaxseed oil will also help increase the HDL and decrease Triglycerides.   Recheck labs in 3 months-----con't meds--- add fenofibrate 160 mg #30  1po qd , 2 refills=---  272.4   Hep, lipid.

## 2011-01-28 ENCOUNTER — Other Ambulatory Visit: Payer: Self-pay | Admitting: Family Medicine

## 2011-01-28 MED ORDER — ATORVASTATIN CALCIUM 20 MG PO TABS: 20.0000 mg | ORAL_TABLET | Freq: Every day | ORAL | Status: DC

## 2011-01-28 NOTE — Telephone Encounter (Signed)
Rx faxed.    KP 

## 2011-03-14 ENCOUNTER — Other Ambulatory Visit: Payer: Self-pay | Admitting: Family Medicine

## 2011-03-17 ENCOUNTER — Encounter: Payer: Self-pay | Admitting: Family Medicine

## 2011-03-17 ENCOUNTER — Other Ambulatory Visit (INDEPENDENT_AMBULATORY_CARE_PROVIDER_SITE_OTHER): Payer: BC Managed Care – PPO

## 2011-03-17 DIAGNOSIS — E039 Hypothyroidism, unspecified: Secondary | ICD-10-CM

## 2011-03-17 LAB — TSH: TSH: 1.94 u[IU]/mL (ref 0.35–5.50)

## 2011-03-17 NOTE — Progress Notes (Signed)
Labs only

## 2011-03-18 ENCOUNTER — Encounter: Payer: Self-pay | Admitting: *Deleted

## 2011-04-15 ENCOUNTER — Other Ambulatory Visit: Payer: Self-pay | Admitting: Family Medicine

## 2011-04-15 DIAGNOSIS — E785 Hyperlipidemia, unspecified: Secondary | ICD-10-CM

## 2011-04-16 ENCOUNTER — Ambulatory Visit (INDEPENDENT_AMBULATORY_CARE_PROVIDER_SITE_OTHER): Payer: BC Managed Care – PPO | Admitting: Family Medicine

## 2011-04-16 ENCOUNTER — Other Ambulatory Visit: Payer: BC Managed Care – PPO

## 2011-04-16 ENCOUNTER — Encounter: Payer: Self-pay | Admitting: Family Medicine

## 2011-04-16 VITALS — BP 110/74 | HR 72 | Temp 98.5°F | Wt 179.0 lb

## 2011-04-16 DIAGNOSIS — E785 Hyperlipidemia, unspecified: Secondary | ICD-10-CM

## 2011-04-16 DIAGNOSIS — H6690 Otitis media, unspecified, unspecified ear: Secondary | ICD-10-CM

## 2011-04-16 LAB — LIPID PANEL
Cholesterol: 157 mg/dL (ref 0–200)
HDL: 61 mg/dL (ref >39.00)
LDL Cholesterol: 76 mg/dL (ref 0–99)
Total CHOL/HDL Ratio: 3
Triglycerides: 98 mg/dL (ref 0.0–149.0)
VLDL: 19.6 mg/dL (ref 0.0–40.0)

## 2011-04-16 LAB — HEPATIC FUNCTION PANEL
ALT: 48 U/L — ABNORMAL HIGH (ref 0–35)
AST: 39 U/L — ABNORMAL HIGH (ref 0–37)
Albumin: 4.9 g/dL (ref 3.5–5.2)
Alkaline Phosphatase: 48 U/L (ref 39–117)
Bilirubin, Direct: 0 mg/dL (ref 0.0–0.3)
Total Bilirubin: 0.5 mg/dL (ref 0.3–1.2)
Total Protein: 8.6 g/dL — ABNORMAL HIGH (ref 6.0–8.3)

## 2011-04-16 MED ORDER — AMOXICILLIN-POT CLAVULANATE 875-125 MG PO TABS: 1.0000 | ORAL_TABLET | Freq: Two times a day (BID) | ORAL | Status: AC

## 2011-04-16 NOTE — Progress Notes (Signed)
  Subjective:     Katie [name] is a 45 y.o. female who presents with ear pain and possible ear infection. Symptoms include: L > R ear pain. Onset of symptoms was 4 days ago, and have been gradually worsening since that time. Associated symptoms include: headache.  Patient denies: congestion, fever , non productive cough and productive cough. She is drinking plenty of fluids.  The following portions of the patient's history were reviewed and updated as appropriate: allergies, current medications, past family history, past medical history, past social history, past surgical history and problem list.  Review of Systems Pertinent items are noted in HPI.   Objective:    BP 110/74  Pulse 72  Temp(Src) 98.5 F (36.9 C) (Oral)  Wt 179 lb (81.194 kg)  SpO2 97% General:  alert, cooperative, appears stated age and no distress  Right Ear: normal  Left Ear: TM red and canal,  Dull and retracted  Mouth:  lips, mucosa, and tongue normal; teeth and gums normal  Neck: no adenopathy and thyroid not enlarged, symmetric, no tenderness/mass/nodules     Assessment:    Left acute otitis media   Plan:    Treatment: Augmentin. OTC analgesia as needed. Fluids, rest, avoid carbonated/alcoholic and caffeinated beverages.  Follow up in as needed if not improving.

## 2011-04-16 NOTE — Progress Notes (Signed)
Addended by: Legrand Como on: 04/16/2011 09:16 AM   Modules accepted: Orders

## 2011-04-16 NOTE — Patient Instructions (Signed)
Middle Ear Infection, Adult  (Otitis Media, Adult)  A middle ear infection is an infection in the space behind the eardrum. The medical name for this is "otitis media." It may happen after a common cold. It is caused by a germ that starts growing in that space. You may feel swollen glands in your neck on the side of the ear infection.  HOME CARE INSTRUCTIONS   Take your medicine as directed until it is gone, even if you feel better after the first few days.    Only take over-the-counter or prescription medicines for pain, discomfort, or fever as directed by your caregiver.    Occasional use of a nasal decongestant a couple times per day may help with discomfort and help the eustachian tube to drain better.   Follow up with your caregiver in 10 to 14 days or as directed, to be certain that the infection has cleared. Not keeping the appointment could result in a chronic or permanent injury, pain, hearing loss and disability. If there is any problem keeping the appointment, you must call back to this facility for assistance.  SEEK IMMEDIATE MEDICAL CARE IF:   You are not getting better in 2 to 3 days.    You have pain that is not controlled with medication.    You feel worse instead of better.    You cannot use the medication as directed.    You develop swelling, redness or pain around the ear or stiffness in your neck.   MAKE SURE YOU:   Understand these instructions.    Will watch your condition.    Will get help right away if you are not doing well or get worse.   Document Released: 05/16/2004 Document Re-Released: 01/29/2010  [employer] Patient Information 2011 [employer].

## 2011-04-16 NOTE — Progress Notes (Signed)
12  

## 2011-04-25 ENCOUNTER — Other Ambulatory Visit: Payer: Self-pay | Admitting: Family Medicine

## 2011-04-29 ENCOUNTER — Other Ambulatory Visit: Payer: Self-pay | Admitting: Family Medicine

## 2011-04-29 MED ORDER — ATORVASTATIN CALCIUM 20 MG PO TABS: 20.0000 mg | ORAL_TABLET | Freq: Every day | ORAL | Status: DC

## 2011-04-29 NOTE — Telephone Encounter (Signed)
KP

## 2011-06-18 ENCOUNTER — Ambulatory Visit (INDEPENDENT_AMBULATORY_CARE_PROVIDER_SITE_OTHER): Payer: BC Managed Care – PPO | Admitting: Family Medicine

## 2011-06-18 ENCOUNTER — Encounter: Payer: Self-pay | Admitting: Family Medicine

## 2011-06-18 VITALS — BP 112/80 | HR 85 | Temp 98.8°F | Ht 64.0 in | Wt 180.2 lb

## 2011-06-18 DIAGNOSIS — N390 Urinary tract infection, site not specified: Secondary | ICD-10-CM

## 2011-06-18 LAB — POCT URINALYSIS DIPSTICK
Glucose, UA: NEGATIVE
Ketones, UA: NEGATIVE
Nitrite, UA: POSITIVE
Protein, UA: NEGATIVE
Spec Grav, UA: 1.02
Urobilinogen, UA: 0.2
pH, UA: 6.5

## 2011-06-18 MED ORDER — CIPROFLOXACIN HCL 500 MG PO TABS: 500.0000 mg | ORAL_TABLET | Freq: Two times a day (BID) | ORAL | Status: AC

## 2011-06-18 NOTE — Patient Instructions (Signed)

## 2011-06-18 NOTE — Progress Notes (Signed)
  Subjective:    Katie [name] is a 45 y.o. female who complains of abnormal smelling urine, burning with urination and frequency. She has had symptoms for 2 days. Patient also complains of none. Patient denies back pain, congestion, cough, fever, headache, rhinitis, sorethroat, stomach ache and vaginal discharge. Patient does not have a history of recurrent UTI. Patient does not have a history of pyelonephritis.   The following portions of the patient's history were reviewed and updated as appropriate: allergies, current medications, past family history, past medical history, past social history, past surgical history and problem list.  Review of Systems Pertinent items are noted in HPI.    Objective:    BP 112/80  Pulse 85  Temp(Src) 98.8 F (37.1 C) (Oral)  Wt 180 lb 3.2 oz (81.738 kg)  SpO2 98%  LMP 06/18/2011 General appearance: alert, cooperative, appears stated age and no distress Abdomen: soft, non-tender; bowel sounds normal; no masses,  no organomegaly  Laboratory:  Urine dipstick: mod for leukocyte esterase and positive for nitrites.   Micro exam: not done.    Assessment:    Acute cystitis and UTI     Plan:    Medications: ciprofloxacin.  Encouraged po fluids Call prn

## 2011-06-21 LAB — URINE CULTURE: Colony Count: >=100000

## 2011-06-23 ENCOUNTER — Other Ambulatory Visit: Payer: Self-pay | Admitting: Family Medicine

## 2011-07-09 ENCOUNTER — Other Ambulatory Visit: Payer: Self-pay | Admitting: Family Medicine

## 2011-08-22 ENCOUNTER — Ambulatory Visit (INDEPENDENT_AMBULATORY_CARE_PROVIDER_SITE_OTHER): Payer: BC Managed Care – PPO | Admitting: Family Medicine

## 2011-08-22 ENCOUNTER — Encounter: Payer: Self-pay | Admitting: Family Medicine

## 2011-08-22 DIAGNOSIS — J329 Chronic sinusitis, unspecified: Secondary | ICD-10-CM

## 2011-08-22 MED ORDER — AMOXICILLIN 875 MG PO TABS: 875.0000 mg | ORAL_TABLET | Freq: Two times a day (BID) | ORAL | Status: AC

## 2011-08-22 NOTE — Progress Notes (Signed)
  Subjective:    Patient ID: Katie [name], female    DOB: 03/02/1966, 45 y.o.   MRN: [id number]  HPI ? Sinus infxn- sxs started 4-5 days ago.  + facial pain, ear pain.  No tooth pain.  Hx of sinus infxns.  + nasal congestion- green.  No fevers.  Cough- pt feels this is related to PND.  No known sick contacts.   Review of Systems For ROS see HPI     Objective:   Physical Exam  Vitals reviewed. Constitutional: She appears well-developed and well-nourished. No distress.  HENT:  Head: Normocephalic and atraumatic.  Right Ear: Tympanic membrane normal.  Left Ear: Tympanic membrane normal.  Nose: Mucosal edema and rhinorrhea present. Right sinus exhibits maxillary sinus tenderness and frontal sinus tenderness. Left sinus exhibits maxillary sinus tenderness and frontal sinus tenderness.  Mouth/Throat: Uvula is midline and mucous membranes are normal. Posterior oropharyngeal erythema present. No oropharyngeal exudate.  Eyes: Conjunctivae and EOM are normal. Pupils are equal, round, and reactive to light.  Neck: Normal range of motion. Neck supple.  Cardiovascular: Normal rate, regular rhythm and normal heart sounds.   Pulmonary/Chest: Effort normal and breath sounds normal. No respiratory distress. She has no wheezes.  Lymphadenopathy:    She has no cervical adenopathy.          Assessment & Plan:

## 2011-08-22 NOTE — Patient Instructions (Signed)
This appears to be an early sinus infection Start the Amoxicillin- take w/ food Add Mucinex to thin your congestion Drink plenty of fluids! REST! Hang in there!! Happy New Year!!!

## 2011-08-24 DIAGNOSIS — J329 Chronic sinusitis, unspecified: Secondary | ICD-10-CM | POA: Insufficient documentation

## 2011-08-24 NOTE — Assessment & Plan Note (Signed)
Pt's hx and PE consistent w/ early sinus infxn.  Start abx.  Reviewed supportive care and red flags that should prompt return.  Pt expressed understanding and is in agreement w/ plan.

## 2011-10-07 ENCOUNTER — Other Ambulatory Visit: Payer: Self-pay | Admitting: Family Medicine

## 2011-10-13 ENCOUNTER — Other Ambulatory Visit: Payer: Self-pay | Admitting: Family Medicine

## 2011-10-13 DIAGNOSIS — E785 Hyperlipidemia, unspecified: Secondary | ICD-10-CM

## 2011-10-14 ENCOUNTER — Other Ambulatory Visit (INDEPENDENT_AMBULATORY_CARE_PROVIDER_SITE_OTHER): Payer: BC Managed Care – PPO

## 2011-10-14 DIAGNOSIS — E785 Hyperlipidemia, unspecified: Secondary | ICD-10-CM

## 2011-10-14 LAB — LIPID PANEL
Cholesterol: 129 mg/dL (ref 0–200)
HDL: 46.3 mg/dL (ref >39.00)
LDL Cholesterol: 68 mg/dL (ref 0–99)
Total CHOL/HDL Ratio: 3
Triglycerides: 75 mg/dL (ref 0.0–149.0)
VLDL: 15 mg/dL (ref 0.0–40.0)

## 2011-10-14 LAB — HEPATIC FUNCTION PANEL
ALT: 41 U/L — ABNORMAL HIGH (ref 0–35)
AST: 34 U/L (ref 0–37)
Albumin: 4.4 g/dL (ref 3.5–5.2)
Alkaline Phosphatase: 44 U/L (ref 39–117)
Bilirubin, Direct: 0 mg/dL (ref 0.0–0.3)
Total Bilirubin: 0.4 mg/dL (ref 0.3–1.2)
Total Protein: 7.6 g/dL (ref 6.0–8.3)

## 2011-11-05 ENCOUNTER — Other Ambulatory Visit: Payer: Self-pay | Admitting: Family Medicine

## 2012-01-28 ENCOUNTER — Ambulatory Visit (INDEPENDENT_AMBULATORY_CARE_PROVIDER_SITE_OTHER): Payer: BC Managed Care – PPO | Admitting: Family Medicine

## 2012-01-28 ENCOUNTER — Encounter: Payer: Self-pay | Admitting: Family Medicine

## 2012-01-28 VITALS — BP 116/72 | HR 76 | Temp 98.5°F | Wt 176.4 lb

## 2012-01-28 DIAGNOSIS — F411 Generalized anxiety disorder: Secondary | ICD-10-CM

## 2012-01-28 DIAGNOSIS — F329 Major depressive disorder, single episode, unspecified: Secondary | ICD-10-CM

## 2012-01-28 DIAGNOSIS — F32A Depression, unspecified: Secondary | ICD-10-CM

## 2012-01-28 NOTE — Assessment & Plan Note (Signed)
Increase pristiq to 100 mg

## 2012-01-28 NOTE — Progress Notes (Signed)
  Subjective:    Patient ID: Katie [name], female    DOB: 1965-09-19, 46 y.o.   MRN: [id number]  HPI Pt here c/o mood swings and hot flashes.  She is still getting a period although not as regular and lightler.  Pt is on pristiq and was put on seroquel by neuro for sleep.   No other complaints.   Review of Systems As above    Objective:   Physical Exam  Constitutional: She is oriented to person, place, and time. She appears well-developed and well-nourished.  Neurological: She is alert and oriented to person, place, and time.  Psychiatric: She has a normal mood and affect. Her behavior is normal. Judgment and thought content normal.          Assessment & Plan:

## 2012-01-28 NOTE — Patient Instructions (Signed)

## 2012-02-03 ENCOUNTER — Other Ambulatory Visit: Payer: Self-pay | Admitting: Family Medicine

## 2012-02-05 ENCOUNTER — Other Ambulatory Visit: Payer: Self-pay | Admitting: Family Medicine

## 2012-02-11 ENCOUNTER — Encounter: Payer: Self-pay | Admitting: Internal Medicine

## 2012-02-11 ENCOUNTER — Ambulatory Visit (INDEPENDENT_AMBULATORY_CARE_PROVIDER_SITE_OTHER): Payer: BC Managed Care – PPO | Admitting: Internal Medicine

## 2012-02-11 VITALS — BP 126/84 | HR 69 | Temp 98.0°F | Wt 176.2 lb

## 2012-02-11 DIAGNOSIS — H6690 Otitis media, unspecified, unspecified ear: Secondary | ICD-10-CM

## 2012-02-11 DIAGNOSIS — H6692 Otitis media, unspecified, left ear: Secondary | ICD-10-CM

## 2012-02-11 DIAGNOSIS — R51 Headache: Secondary | ICD-10-CM

## 2012-02-11 MED ORDER — TRAMADOL HCL 50 MG PO TABS: ORAL_TABLET | ORAL | Status: DC

## 2012-02-11 MED ORDER — AMOXICILLIN 500 MG PO CAPS: 500.0000 mg | ORAL_CAPSULE | Freq: Three times a day (TID) | ORAL | Status: AC

## 2012-02-11 MED ORDER — NEOMYCIN-POLYMYXIN-HC 3.5-10000-1 OT SOLN: 3.0000 [drp] | Freq: Four times a day (QID) | OTIC | Status: AC

## 2012-02-11 NOTE — Progress Notes (Signed)
  Subjective:    Patient ID: Katie [name], female    DOB: Apr 28, 1966, 46 y.o.   MRN: [id number]  HPI She awoke 02/10/12 with left earache and associated diffuse frontal and temporal area headache bilaterally. It has been constant up to level 5. Nonsteroidals were of no benefit. This has been associated with some nausea. She has felt feverish but has not documented any elevated temperature. She has a history of migraine headaches; the present headache does not mimic that    Review of Systems There has been no associated otic discharge, facial pain, nasal purulence, ST or dental pain. She's also had no cough or sputum production. She's had no blurred vision, double vision, or vision loss. There's been no ophthalmologic discharge     Objective:   Physical Exam General appearance:good health ;well nourished; no acute distress or increased work of breathing is present but fatigued appearing.  No  lymphadenopathy about the head, neck, or axilla noted.   Eyes: No conjunctival inflammation or lid edema is present. Slight ptosis OS. EOMI ; vision normal  Ears:  External ear exam shows no significant lesions or deformities.  Otoscopic examination reveals clear canals. R tympanic membrane are intact bilaterally without bulging, retraction, inflammation or discharge. L TM erythematous  Nose:  External nasal examination shows no deformity or inflammation. Nasal mucosa are pink and moist without lesions or exudates. No septal dislocation or deviation.No obstruction to airflow.   Oral exam: Dental hygiene is good; lips and gums are healthy appearing.There is no oropharyngeal erythema or exudate noted.   Neck:  No deformities, thyromegaly, masses, or tenderness noted.   Supple with full range of motion without pain.   Heart:  Normal rate and regular rhythm. S1 and S2 normal without gallop, murmur, click, rub or other extra sounds.   Lungs:Chest clear to auscultation; no wheezes, rhonchi,rales ,or rubs  present.No increased work of breathing.    Extremities:  No cyanosis, edema, or clubbing  noted    Skin: Warm & dry .          Assessment & Plan:  #1 otitis media, left  #2 headache associated with #1  Plan: See orders &  recommendations

## 2012-02-11 NOTE — Patient Instructions (Addendum)
Plain Mucinex for thick secretions ;force NON dairy fluids . Use a Neti pot daily as needed for sinus congestion; going from open side to congested side . Nasal cleansing in the shower as discussed. Make sure that all residual soap is removed to prevent irritation.  

## 2012-02-24 ENCOUNTER — Telehealth: Payer: Self-pay | Admitting: *Deleted

## 2012-02-24 MED ORDER — DESVENLAFAXINE SUCCINATE ER 100 MG PO TB24: 100.0000 mg | ORAL_TABLET | Freq: Every day | ORAL | Status: DC

## 2012-02-24 NOTE — Telephone Encounter (Signed)
Faxed.   KP 

## 2012-02-24 NOTE — Telephone Encounter (Signed)
Ok to send rx 100 mg  Daily  #30   11 refills

## 2012-02-24 NOTE — Telephone Encounter (Signed)
Pt called to report that the increase dosing of Pristiq 50mg  2 tab qd is working and would like to get a new Rx sent in to pharmacy.Please advise

## 2012-02-27 ENCOUNTER — Encounter: Payer: BC Managed Care – PPO | Admitting: Family Medicine

## 2012-03-04 ENCOUNTER — Other Ambulatory Visit: Payer: Self-pay | Admitting: Family Medicine

## 2012-03-12 ENCOUNTER — Ambulatory Visit (INDEPENDENT_AMBULATORY_CARE_PROVIDER_SITE_OTHER): Payer: BC Managed Care – PPO | Admitting: Family Medicine

## 2012-03-12 ENCOUNTER — Encounter: Payer: Self-pay | Admitting: Family Medicine

## 2012-03-12 VITALS — BP 110/76 | HR 77 | Temp 98.4°F | Ht 63.0 in | Wt 175.4 lb

## 2012-03-12 DIAGNOSIS — E785 Hyperlipidemia, unspecified: Secondary | ICD-10-CM

## 2012-03-12 DIAGNOSIS — E039 Hypothyroidism, unspecified: Secondary | ICD-10-CM

## 2012-03-12 DIAGNOSIS — F411 Generalized anxiety disorder: Secondary | ICD-10-CM

## 2012-03-12 DIAGNOSIS — Z0000 Encounter for general adult medical examination without abnormal findings: Secondary | ICD-10-CM

## 2012-03-12 LAB — HEPATIC FUNCTION PANEL
ALT: 59 U/L — ABNORMAL HIGH (ref 0–35)
AST: 45 U/L — ABNORMAL HIGH (ref 0–37)
Albumin: 4.6 g/dL (ref 3.5–5.2)
Alkaline Phosphatase: 47 U/L (ref 39–117)
Bilirubin, Direct: 0.1 mg/dL (ref 0.0–0.3)
Total Bilirubin: 0.6 mg/dL (ref 0.3–1.2)
Total Protein: 7.7 g/dL (ref 6.0–8.3)

## 2012-03-12 LAB — LIPID PANEL
Cholesterol: 154 mg/dL (ref 0–200)
HDL: 57.1 mg/dL (ref >39.00)
LDL Cholesterol: 81 mg/dL (ref 0–99)
Total CHOL/HDL Ratio: 3
Triglycerides: 79 mg/dL (ref 0.0–149.0)
VLDL: 15.8 mg/dL (ref 0.0–40.0)

## 2012-03-12 LAB — CBC WITH DIFFERENTIAL/PLATELET
Basophils Absolute: 0 10*3/uL (ref 0.0–0.1)
Basophils Relative: 0.7 % (ref 0.0–3.0)
Eosinophils Absolute: 0.1 10*3/uL (ref 0.0–0.7)
Eosinophils Relative: 2.9 % (ref 0.0–5.0)
HCT: 41.8 % (ref 36.0–46.0)
Hemoglobin: 14.1 g/dL (ref 12.0–15.0)
Lymphocytes Relative: 37.7 % (ref 12.0–46.0)
Lymphs Abs: 2 10*3/uL (ref 0.7–4.0)
MCHC: 33.7 g/dL (ref 30.0–36.0)
MCV: 89.9 fl (ref 78.0–100.0)
Monocytes Absolute: 0.4 10*3/uL (ref 0.1–1.0)
Monocytes Relative: 7.4 % (ref 3.0–12.0)
Neutro Abs: 2.7 10*3/uL (ref 1.4–7.7)
Neutrophils Relative %: 51.3 % (ref 43.0–77.0)
Platelets: 283 10*3/uL (ref 150.0–400.0)
RBC: 4.65 Mil/uL (ref 3.87–5.11)
RDW: 13 % (ref 11.5–14.6)
WBC: 5.2 10*3/uL (ref 4.5–10.5)

## 2012-03-12 LAB — POCT URINALYSIS DIPSTICK
Bilirubin, UA: NEGATIVE
Blood, UA: NEGATIVE
Glucose, UA: NEGATIVE
Ketones, UA: NEGATIVE
Leukocytes, UA: NEGATIVE
Nitrite, UA: NEGATIVE
Protein, UA: NEGATIVE
Spec Grav, UA: >=1.03
Urobilinogen, UA: 0.2
pH, UA: 6

## 2012-03-12 LAB — BASIC METABOLIC PANEL
BUN: 17 mg/dL (ref 6–23)
CO2: 29 mEq/L (ref 19–32)
Calcium: 9.6 mg/dL (ref 8.4–10.5)
Chloride: 102 mEq/L (ref 96–112)
Creatinine, Ser: 0.9 mg/dL (ref 0.4–1.2)
GFR: 68.79 mL/min (ref >60.00)
Glucose, Bld: 81 mg/dL (ref 70–99)
Potassium: 4.2 mEq/L (ref 3.5–5.1)
Sodium: 139 mEq/L (ref 135–145)

## 2012-03-12 LAB — TSH: TSH: 6.79 u[IU]/mL — ABNORMAL HIGH (ref 0.35–5.50)

## 2012-03-12 NOTE — Patient Instructions (Signed)
Preventive Care for Adults, Female A healthy lifestyle and preventive care can promote health and wellness. Preventive health guidelines for women include the following key practices.  A routine yearly physical is a good way to check with your caregiver about your health and preventive screening. It is a chance to share any concerns and updates on your health, and to receive a thorough exam.   Visit your dentist for a routine exam and preventive care every 6 months. Brush your teeth twice a day and floss once a day. Good oral hygiene prevents tooth decay and gum disease.   The frequency of eye exams is based on your age, health, family medical history, use of contact lenses, and other factors. Follow your caregiver's recommendations for frequency of eye exams.   Eat a healthy diet. Foods like vegetables, fruits, whole grains, low-fat dairy products, and lean protein foods contain the nutrients you need without too many calories. Decrease your intake of foods high in solid fats, added sugars, and salt. Eat the right amount of calories for you.Get information about a proper diet from your caregiver, if necessary.   Regular physical exercise is one of the most important things you can do for your health. Most adults should get at least 150 minutes of moderate-intensity exercise (any activity that increases your heart rate and causes you to sweat) each week. In addition, most adults need muscle-strengthening exercises on 2 or more days a week.   Maintain a healthy weight. The body mass index (BMI) is a screening tool to identify possible weight problems. It provides an estimate of body fat based on height and weight. Your caregiver can help determine your BMI, and can help you achieve or maintain a healthy weight.For adults 20 years and older:   A BMI below 18.5 is considered underweight.   A BMI of 18.5 to 24.9 is normal.   A BMI of 25 to 29.9 is considered overweight.   A BMI of 30 and above is  considered obese.   Maintain normal blood lipids and cholesterol levels by exercising and minimizing your intake of saturated fat. Eat a balanced diet with plenty of fruit and vegetables. Blood tests for lipids and cholesterol should begin at age 20 and be repeated every 5 years. If your lipid or cholesterol levels are high, you are over 50, or you are at high risk for heart disease, you may need your cholesterol levels checked more frequently.Ongoing high lipid and cholesterol levels should be treated with medicines if diet and exercise are not effective.   If you smoke, find out from your caregiver how to quit. If you do not use tobacco, do not start.   If you are pregnant, do not drink alcohol. If you are breastfeeding, be very cautious about drinking alcohol. If you are not pregnant and choose to drink alcohol, do not exceed 1 drink per day. One drink is considered to be 12 ounces (355 mL) of beer, 5 ounces (148 mL) of wine, or 1.5 ounces (44 mL) of liquor.   Avoid use of street drugs. Do not share needles with anyone. Ask for help if you need support or instructions about stopping the use of drugs.   High blood pressure causes heart disease and increases the risk of stroke. Your blood pressure should be checked at least every 1 to 2 years. Ongoing high blood pressure should be treated with medicines if weight loss and exercise are not effective.   If you are 55 to 44   years old, ask your caregiver if you should take aspirin to prevent strokes.   Diabetes screening involves taking a blood sample to check your fasting blood sugar level. This should be done once every 3 years, after age 45, if you are within normal weight and without risk factors for diabetes. Testing should be considered at a younger age or be carried out more frequently if you are overweight and have at least 1 risk factor for diabetes.   Breast cancer screening is essential preventive care for women. You should practice "breast  self-awareness." This means understanding the normal appearance and feel of your breasts and may include breast self-examination. Any changes detected, no matter how small, should be reported to a caregiver. Women in their 20s and 30s should have a clinical breast exam (CBE) by a caregiver as part of a regular health exam every 1 to 3 years. After age 40, women should have a CBE every year. Starting at age 40, women should consider having a mammography (breast X-ray test) every year. Women who have a family history of breast cancer should talk to their caregiver about genetic screening. Women at a high risk of breast cancer should talk to their caregivers about having magnetic resonance imaging (MRI) and a mammography every year.   The Pap test is a screening test for cervical cancer. A Pap test can show cell changes on the cervix that might become cervical cancer if left untreated. A Pap test is a procedure in which cells are obtained and examined from the lower end of the uterus (cervix).   Women should have a Pap test starting at age 21.   Between ages 21 and 29, Pap tests should be repeated every 2 years.   Beginning at age 30, you should have a Pap test every 3 years as long as the past 3 Pap tests have been normal.   Some women have medical problems that increase the chance of getting cervical cancer. Talk to your caregiver about these problems. It is especially important to talk to your caregiver if a new problem develops soon after your last Pap test. In these cases, your caregiver may recommend more frequent screening and Pap tests.   The above recommendations are the same for women who have or have not gotten the vaccine for human papillomavirus (HPV).   If you had a hysterectomy for a problem that was not cancer or a condition that could lead to cancer, then you no longer need Pap tests. Even if you no longer need a Pap test, a regular exam is a good idea to make sure no other problems are  starting.   If you are between ages 65 and 70, and you have had normal Pap tests going back 10 years, you no longer need Pap tests. Even if you no longer need a Pap test, a regular exam is a good idea to make sure no other problems are starting.   If you have had past treatment for cervical cancer or a condition that could lead to cancer, you need Pap tests and screening for cancer for at least 20 years after your treatment.   If Pap tests have been discontinued, risk factors (such as a new sexual partner) need to be reassessed to determine if screening should be resumed.   The HPV test is an additional test that may be used for cervical cancer screening. The HPV test looks for the virus that can cause the cell changes on the cervix.   The cells collected during the Pap test can be tested for HPV. The HPV test could be used to screen women aged 30 years and older, and should be used in women of any age who have unclear Pap test results. After the age of 30, women should have HPV testing at the same frequency as a Pap test.   Colorectal cancer can be detected and often prevented. Most routine colorectal cancer screening begins at the age of 50 and continues through age 75. However, your caregiver may recommend screening at an earlier age if you have risk factors for colon cancer. On a yearly basis, your caregiver may provide home test kits to check for hidden blood in the stool. Use of a small camera at the end of a tube, to directly examine the colon (sigmoidoscopy or colonoscopy), can detect the earliest forms of colorectal cancer. Talk to your caregiver about this at age 50, when routine screening begins. Direct examination of the colon should be repeated every 5 to 10 years through age 75, unless early forms of pre-cancerous polyps or small growths are found.   Hepatitis C blood testing is recommended for all people born from 1945 through 1965 and any individual with known risks for hepatitis C.    Practice safe sex. Use condoms and avoid high-risk sexual practices to reduce the spread of sexually transmitted infections (STIs). STIs include gonorrhea, chlamydia, syphilis, trichomonas, herpes, HPV, and human immunodeficiency virus (HIV). Herpes, HIV, and HPV are viral illnesses that have no cure. They can result in disability, cancer, and death. Sexually active women aged 25 and younger should be checked for chlamydia. Older women with new or multiple partners should also be tested for chlamydia. Testing for other STIs is recommended if you are sexually active and at increased risk.   Osteoporosis is a disease in which the bones lose minerals and strength with aging. This can result in serious bone fractures. The risk of osteoporosis can be identified using a bone density scan. Women ages 65 and over and women at risk for fractures or osteoporosis should discuss screening with their caregivers. Ask your caregiver whether you should take a calcium supplement or vitamin D to reduce the rate of osteoporosis.   Menopause can be associated with physical symptoms and risks. Hormone replacement therapy is available to decrease symptoms and risks. You should talk to your caregiver about whether hormone replacement therapy is right for you.   Use sunscreen with sun protection factor (SPF) of 30 or more. Apply sunscreen liberally and repeatedly throughout the day. You should seek shade when your shadow is shorter than you. Protect yourself by wearing long sleeves, pants, a wide-brimmed hat, and sunglasses year round, whenever you are outdoors.   Once a month, do a whole body skin exam, using a mirror to look at the skin on your back. Notify your caregiver of new moles, moles that have irregular borders, moles that are larger than a pencil eraser, or moles that have changed in shape or color.   Stay current with required immunizations.   Influenza. You need a dose every fall (or winter). The composition of  the flu vaccine changes each year, so being vaccinated once is not enough.   Pneumococcal polysaccharide. You need 1 to 2 doses if you smoke cigarettes or if you have certain chronic medical conditions. You need 1 dose at age 65 (or older) if you have never been vaccinated.   Tetanus, diphtheria, pertussis (Tdap, Td). Get 1 dose of   Tdap vaccine if you are younger than age 65, are over 65 and have contact with an infant, are a healthcare worker, are pregnant, or simply want to be protected from whooping cough. After that, you need a Td booster dose every 10 years. Consult your caregiver if you have not had at least 3 tetanus and diphtheria-containing shots sometime in your life or have a deep or dirty wound.   HPV. You need this vaccine if you are a woman age 26 or younger. The vaccine is given in 3 doses over 6 months.   Measles, mumps, rubella (MMR). You need at least 1 dose of MMR if you were born in 1957 or later. You may also need a second dose.   Meningococcal. If you are age 19 to 21 and a first-year college student living in a residence hall, or have one of several medical conditions, you need to get vaccinated against meningococcal disease. You may also need additional booster doses.   Zoster (shingles). If you are age 60 or older, you should get this vaccine.   Varicella (chickenpox). If you have never had chickenpox or you were vaccinated but received only 1 dose, talk to your caregiver to find out if you need this vaccine.   Hepatitis A. You need this vaccine if you have a specific risk factor for hepatitis A virus infection or you simply wish to be protected from this disease. The vaccine is usually given as 2 doses, 6 to 18 months apart.   Hepatitis B. You need this vaccine if you have a specific risk factor for hepatitis B virus infection or you simply wish to be protected from this disease. The vaccine is given in 3 doses, usually over 6 months.  Preventive Services /  Frequency Ages 19 to 39  Blood pressure check.** / Every 1 to 2 years.   Lipid and cholesterol check.** / Every 5 years beginning at age 20.   Clinical breast exam.** / Every 3 years for women in their 20s and 30s.   Pap test.** / Every 2 years from ages 21 through 29. Every 3 years starting at age 30 through age 65 or 70 with a history of 3 consecutive normal Pap tests.   HPV screening.** / Every 3 years from ages 30 through ages 65 to 70 with a history of 3 consecutive normal Pap tests.   Hepatitis C blood test.** / For any individual with known risks for hepatitis C.   Skin self-exam. / Monthly.   Influenza immunization.** / Every year.   Pneumococcal polysaccharide immunization.** / 1 to 2 doses if you smoke cigarettes or if you have certain chronic medical conditions.   Tetanus, diphtheria, pertussis (Tdap, Td) immunization. / A one-time dose of Tdap vaccine. After that, you need a Td booster dose every 10 years.   HPV immunization. / 3 doses over 6 months, if you are 26 and younger.   Measles, mumps, rubella (MMR) immunization. / You need at least 1 dose of MMR if you were born in 1957 or later. You may also need a second dose.   Meningococcal immunization. / 1 dose if you are age 19 to 21 and a first-year college student living in a residence hall, or have one of several medical conditions, you need to get vaccinated against meningococcal disease. You may also need additional booster doses.   Varicella immunization.** / Consult your caregiver.   Hepatitis A immunization.** / Consult your caregiver. 2 doses, 6 to 18 months   apart.   Hepatitis B immunization.** / Consult your caregiver. 3 doses usually over 6 months.  Ages 40 to 64  Blood pressure check.** / Every 1 to 2 years.   Lipid and cholesterol check.** / Every 5 years beginning at age 20.   Clinical breast exam.** / Every year after age 40.   Mammogram.** / Every year beginning at age 40 and continuing for as  long as you are in good health. Consult with your caregiver.   Pap test.** / Every 3 years starting at age 30 through age 65 or 70 with a history of 3 consecutive normal Pap tests.   HPV screening.** / Every 3 years from ages 30 through ages 65 to 70 with a history of 3 consecutive normal Pap tests.   Fecal occult blood test (FOBT) of stool. / Every year beginning at age 50 and continuing until age 75. You may not need to do this test if you get a colonoscopy every 10 years.   Flexible sigmoidoscopy or colonoscopy.** / Every 5 years for a flexible sigmoidoscopy or every 10 years for a colonoscopy beginning at age 50 and continuing until age 75.   Hepatitis C blood test.** / For all people born from 1945 through 1965 and any individual with known risks for hepatitis C.   Skin self-exam. / Monthly.   Influenza immunization.** / Every year.   Pneumococcal polysaccharide immunization.** / 1 to 2 doses if you smoke cigarettes or if you have certain chronic medical conditions.   Tetanus, diphtheria, pertussis (Tdap, Td) immunization.** / A one-time dose of Tdap vaccine. After that, you need a Td booster dose every 10 years.   Measles, mumps, rubella (MMR) immunization. / You need at least 1 dose of MMR if you were born in 1957 or later. You may also need a second dose.   Varicella immunization.** / Consult your caregiver.   Meningococcal immunization.** / Consult your caregiver.   Hepatitis A immunization.** / Consult your caregiver. 2 doses, 6 to 18 months apart.   Hepatitis B immunization.** / Consult your caregiver. 3 doses, usually over 6 months.  Ages 65 and over  Blood pressure check.** / Every 1 to 2 years.   Lipid and cholesterol check.** / Every 5 years beginning at age 20.   Clinical breast exam.** / Every year after age 40.   Mammogram.** / Every year beginning at age 40 and continuing for as long as you are in good health. Consult with your caregiver.   Pap test.** /  Every 3 years starting at age 30 through age 65 or 70 with a 3 consecutive normal Pap tests. Testing can be stopped between 65 and 70 with 3 consecutive normal Pap tests and no abnormal Pap or HPV tests in the past 10 years.   HPV screening.** / Every 3 years from ages 30 through ages 65 or 70 with a history of 3 consecutive normal Pap tests. Testing can be stopped between 65 and 70 with 3 consecutive normal Pap tests and no abnormal Pap or HPV tests in the past 10 years.   Fecal occult blood test (FOBT) of stool. / Every year beginning at age 50 and continuing until age 75. You may not need to do this test if you get a colonoscopy every 10 years.   Flexible sigmoidoscopy or colonoscopy.** / Every 5 years for a flexible sigmoidoscopy or every 10 years for a colonoscopy beginning at age 50 and continuing until age 75.   Hepatitis   C blood test.** / For all people born from 1945 through 1965 and any individual with known risks for hepatitis C.   Osteoporosis screening.** / A one-time screening for women ages 65 and over and women at risk for fractures or osteoporosis.   Skin self-exam. / Monthly.   Influenza immunization.** / Every year.   Pneumococcal polysaccharide immunization.** / 1 dose at age 65 (or older) if you have never been vaccinated.   Tetanus, diphtheria, pertussis (Tdap, Td) immunization. / A one-time dose of Tdap vaccine if you are over 65 and have contact with an infant, are a healthcare worker, or simply want to be protected from whooping cough. After that, you need a Td booster dose every 10 years.   Varicella immunization.** / Consult your caregiver.   Meningococcal immunization.** / Consult your caregiver.   Hepatitis A immunization.** / Consult your caregiver. 2 doses, 6 to 18 months apart.   Hepatitis B immunization.** / Check with your caregiver. 3 doses, usually over 6 months.  ** Family history and personal history of risk and conditions may change your caregiver's  recommendations. Document Released: 10/07/2001 Document Revised: 07/31/2011 Document Reviewed: 01/06/2011 [employer] Patient Information 2012 [employer]. 

## 2012-03-12 NOTE — Assessment & Plan Note (Signed)
Check labs con't meds 

## 2012-03-12 NOTE — Progress Notes (Signed)
Subjective:     Katie [name] is a 46 y.o. female and is here for a comprehensive physical exam. The patient reports no problems.  History   Social History  . Marital Status: Married    Spouse Name: N/A    Number of Children: 2  . Years of Education: 15   Occupational History  . 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 Smoking status: Former Smoker -- 1.0 packs/day    Quit date: 01/14/1995  . Smokeless tobacco: Former Neurosurgeon    Quit date: 08/26/1995   Comment: started smoking at age 80 1ppd  . Alcohol Use: 0.5 oz/week    1 drink(s) per week  . Drug Use: No  . Sexually Active: Yes -- Female partner(s)   Other Topics Concern  . Not on file   Social History Narrative   Exercise-- no   Health Maintenance  Topic Date Due  . Mammogram  03/03/2012  . Influenza Vaccine  05/25/2012  . Pap Smear  01/13/2014  . Tetanus/tdap  06/20/2018    The following portions of the patient's history were reviewed and updated as appropriate: allergies, current medications, past family history, past medical history, past social history, past surgical history and problem list.  Review of Systems Review of Systems  Constitutional: Negative for activity change, appetite change and fatigue.  HENT: Negative for hearing loss, congestion, tinnitus and ear discharge.  dentist q78m Eyes: Negative for visual disturbance (see optho q1y -- vision corrected to 20/20 with glasses).  Respiratory: Negative for cough, chest tightness and shortness of breath.   Cardiovascular: Negative for chest pain, palpitations and leg swelling.  Gastrointestinal: Negative for abdominal pain, diarrhea, constipation and abdominal distention.  Genitourinary: Negative for urgency, frequency, decreased urine volume and difficulty urinating.  Musculoskeletal: Negative for back pain, arthralgias and gait problem.  Skin: Negative for color change, pallor and rash.  Neurological: Negative for dizziness,  light-headedness, numbness and headaches.  Hematological: Negative for adenopathy. Does not bruise/bleed easily.  Psychiatric/Behavioral: Negative for suicidal ideas, confusion, sleep disturbance, self-injury, dysphoric mood, decreased concentration and agitation.       Objective:    BP 110/76  Pulse 77  Temp 98.4 F (36.9 C) (Oral)  Ht 5\' 3"  (1.6 m)  Wt 175 lb 6.4 oz (79.561 kg)  BMI 31.07 kg/m2  SpO2 98% General appearance: alert, cooperative, appears stated age and no distress Head: Normocephalic, without obvious abnormality, atraumatic Eyes: conjunctivae/corneas clear. PERRL, EOM's intact. Fundi benign. Ears: normal TM's and external ear canals both ears Nose: Nares normal. Septum midline. Mucosa normal. No drainage or sinus tenderness. Throat: lips, mucosa, and tongue normal; teeth and gums normal Neck: no adenopathy, no carotid bruit, no JVD, supple, symmetrical, trachea midline and thyroid not enlarged, symmetric, no tenderness/mass/nodules Back: symmetric, no curvature. ROM normal. No CVA tenderness. Lungs: clear to auscultation bilaterally Breasts: gyn Heart: regular rate and rhythm, S1, S2 normal, no murmur, click, rub or gallop Abdomen: soft, non-tender; bowel sounds normal; no masses,  no organomegaly Pelvic: deferred and gyn Extremities: extremities normal, atraumatic, no cyanosis or edema Pulses: 2+ and symmetric Skin: Skin color, texture, turgor normal. No rashes or lesions Lymph nodes: Cervical, supraclavicular, and axillary nodes normal. Neurologic: Alert and oriented X 3, normal strength and tone. Normal symmetric reflexes. Normal coordination and gait psych--  no depression , anxiety    Assessment:    Healthy female exam.      Plan:  Fasting labs ghm utd  See After Visit Summary for Counseling Recommendations

## 2012-03-12 NOTE — Assessment & Plan Note (Signed)
Cont meds   

## 2012-03-16 ENCOUNTER — Other Ambulatory Visit: Payer: Self-pay | Admitting: *Deleted

## 2012-03-16 DIAGNOSIS — E039 Hypothyroidism, unspecified: Secondary | ICD-10-CM

## 2012-03-16 MED ORDER — LEVOTHYROXINE SODIUM 125 MCG PO TABS: 125.0000 ug | ORAL_TABLET | Freq: Every day | ORAL | Status: DC

## 2012-03-26 ENCOUNTER — Other Ambulatory Visit (INDEPENDENT_AMBULATORY_CARE_PROVIDER_SITE_OTHER): Payer: BC Managed Care – PPO

## 2012-03-26 DIAGNOSIS — K7200 Acute and subacute hepatic failure without coma: Secondary | ICD-10-CM

## 2012-03-26 DIAGNOSIS — K7290 Hepatic failure, unspecified without coma: Secondary | ICD-10-CM

## 2012-03-26 LAB — HEPATIC FUNCTION PANEL
ALT: 30 U/L (ref 0–35)
AST: 26 U/L (ref 0–37)
Albumin: 4.5 g/dL (ref 3.5–5.2)
Alkaline Phosphatase: 49 U/L (ref 39–117)
Bilirubin, Direct: 0 mg/dL (ref 0.0–0.3)
Total Bilirubin: 0.4 mg/dL (ref 0.3–1.2)
Total Protein: 7.9 g/dL (ref 6.0–8.3)

## 2012-03-26 LAB — GAMMA GT: GGT: 21 U/L (ref 7–51)

## 2012-03-26 NOTE — Progress Notes (Signed)
Labs only

## 2012-03-29 LAB — HEPATITIS PANEL, ACUTE
HCV Ab: NEGATIVE
Hep A IgM: NEGATIVE
Hep B C IgM: NEGATIVE
Hepatitis B Surface Ag: NEGATIVE

## 2012-04-15 ENCOUNTER — Other Ambulatory Visit (INDEPENDENT_AMBULATORY_CARE_PROVIDER_SITE_OTHER): Payer: BC Managed Care – PPO

## 2012-04-15 DIAGNOSIS — E039 Hypothyroidism, unspecified: Secondary | ICD-10-CM

## 2012-04-15 LAB — TSH: TSH: 0.79 u[IU]/mL (ref 0.35–5.50)

## 2012-04-22 ENCOUNTER — Encounter: Payer: Self-pay | Admitting: Family Medicine

## 2012-04-22 ENCOUNTER — Ambulatory Visit (INDEPENDENT_AMBULATORY_CARE_PROVIDER_SITE_OTHER): Payer: BC Managed Care – PPO | Admitting: Family Medicine

## 2012-04-22 VITALS — BP 118/76 | HR 74 | Temp 98.6°F | Wt 175.2 lb

## 2012-04-22 DIAGNOSIS — E785 Hyperlipidemia, unspecified: Secondary | ICD-10-CM

## 2012-04-22 DIAGNOSIS — H6690 Otitis media, unspecified, unspecified ear: Secondary | ICD-10-CM

## 2012-04-22 MED ORDER — PITAVASTATIN CALCIUM 2 MG PO TABS: ORAL_TABLET | ORAL | Status: DC

## 2012-04-22 MED ORDER — AMOXICILLIN-POT CLAVULANATE 875-125 MG PO TABS: 1.0000 | ORAL_TABLET | Freq: Two times a day (BID) | ORAL | Status: AC

## 2012-04-22 NOTE — Patient Instructions (Addendum)

## 2012-04-22 NOTE — Progress Notes (Signed)
  Subjective:     Katie [name] is a 46 y.o. female who presents with ear pain and possible ear infection. Symptoms include: left ear pain. Onset of symptoms was 3 days ago, and have been gradually worsening since that time. Associated symptoms include: post nasal drip.  Patient denies: achiness, chills, low grade fever, non productive cough, sinus pressure, sneezing and sore throat. She is drinking plenty of fluids.  The following portions of the patient's history were reviewed and updated as appropriate: allergies, current medications, past family history, past medical history, past social history, past surgical history and problem list.  Review of Systems Pertinent items are noted in HPI.   Objective:    BP 118/76  Pulse 74  Temp 98.6 F (37 C) (Oral)  Wt 175 lb 3.2 oz (79.47 kg)  SpO2 96% General:  alert, cooperative, appears stated age and no distress  Right Ear: normal landmarks and mobility  Left Ear: diminished mobility, TM errythematous  Mouth:  abnormal findings: mild oropharyngeal erythema and pnd  Neck: no adenopathy, supple, symmetrical, trachea midline and thyroid not enlarged, symmetric, no tenderness/mass/nodules     Assessment:    Left acute otitis media   Plan:    Treatment: Augmentin. OTC analgesia as needed. Fluids, rest, avoid carbonated/alcoholic and caffeinated beverages.  Follow up in 2 weeks if not improving.

## 2012-05-07 ENCOUNTER — Ambulatory Visit (INDEPENDENT_AMBULATORY_CARE_PROVIDER_SITE_OTHER): Payer: BC Managed Care – PPO | Admitting: Family Medicine

## 2012-05-07 ENCOUNTER — Encounter: Payer: Self-pay | Admitting: Family Medicine

## 2012-05-07 VITALS — BP 116/74 | HR 75 | Temp 98.4°F | Wt 175.0 lb

## 2012-05-07 DIAGNOSIS — E785 Hyperlipidemia, unspecified: Secondary | ICD-10-CM

## 2012-05-07 DIAGNOSIS — E039 Hypothyroidism, unspecified: Secondary | ICD-10-CM

## 2012-05-07 DIAGNOSIS — H6690 Otitis media, unspecified, unspecified ear: Secondary | ICD-10-CM

## 2012-05-07 LAB — HEPATIC FUNCTION PANEL
ALT: 41 U/L — ABNORMAL HIGH (ref 0–35)
AST: 34 U/L (ref 0–37)
Albumin: 4.4 g/dL (ref 3.5–5.2)
Alkaline Phosphatase: 47 U/L (ref 39–117)
Bilirubin, Direct: 0 mg/dL (ref 0.0–0.3)
Total Bilirubin: 0.7 mg/dL (ref 0.3–1.2)
Total Protein: 7.9 g/dL (ref 6.0–8.3)

## 2012-05-07 LAB — BASIC METABOLIC PANEL
BUN: 15 mg/dL (ref 6–23)
CO2: 27 mEq/L (ref 19–32)
Calcium: 9.3 mg/dL (ref 8.4–10.5)
Chloride: 103 mEq/L (ref 96–112)
Creatinine, Ser: 0.8 mg/dL (ref 0.4–1.2)
GFR: 80.48 mL/min (ref >60.00)
Glucose, Bld: 97 mg/dL (ref 70–99)
Potassium: 4.5 mEq/L (ref 3.5–5.1)
Sodium: 137 mEq/L (ref 135–145)

## 2012-05-07 LAB — LIPID PANEL
Cholesterol: 150 mg/dL (ref 0–200)
HDL: 52.3 mg/dL (ref >39.00)
LDL Cholesterol: 82 mg/dL (ref 0–99)
Total CHOL/HDL Ratio: 3
Triglycerides: 81 mg/dL (ref 0.0–149.0)
VLDL: 16.2 mg/dL (ref 0.0–40.0)

## 2012-05-07 LAB — TSH: TSH: 3.26 u[IU]/mL (ref 0.35–5.50)

## 2012-05-07 MED ORDER — PITAVASTATIN CALCIUM 2 MG PO TABS: ORAL_TABLET | ORAL | Status: DC

## 2012-05-07 NOTE — Assessment & Plan Note (Signed)
Check labs con't meds 

## 2012-05-07 NOTE — Progress Notes (Signed)
  Subjective:    Patient ID: Katie [name], female    DOB: 12/26/1965, 46 y.o.   MRN: [id number]  HPI Pt here f/u om and to labs done.  Pt with no complaints.     Review of Systems As above    Objective:   Physical Exam  Constitutional: She is oriented to person, place, and time. She appears well-developed and well-nourished.  Neck: Normal range of motion. Neck supple.  Cardiovascular: Normal rate and regular rhythm.   Pulmonary/Chest: Effort normal and breath sounds normal. No respiratory distress. She has no wheezes. She has no rales.  Musculoskeletal: She exhibits no edema.  Neurological: She is alert and oriented to person, place, and time.  Psychiatric: She has a normal mood and affect. Her behavior is normal. Judgment and thought content normal.          Assessment & Plan:

## 2012-05-07 NOTE — Patient Instructions (Signed)
Cholesterol Cholesterol is a type of fat. Your body needs a small amount of cholesterol, but too much can cause health problems. Certain problems include heart attacks, strokes, and not enough blood flow to your heart, brain, kidneys, or feet. You get cholesterol in 2 ways:  Naturally.   By eating certain foods.  HOME CARE  Eat a low-fat diet:   Eat less eggs, whole dairy products (whole milk, cheese, and butter), fatty meats, and fried foods.   Eat more fruits, vegetables, whole-wheat breads, lean chicken, and fish.   Follow your exercise program as told by your doctor.   Keep your weight at a healthy level. Talk to your doctor about what is right for you.   Only take medicine as told by your doctor.   Get your cholesterol checked once a year or as told by your doctor.  MAKE SURE YOU:  Understand these instructions.   Will watch your condition.   Will get help right away if you are not doing well or get worse.  Document Released: 11/07/2008 Document Revised: 07/31/2011 Document Reviewed: 11/07/2008 [facility] Patient Information 2012 [place], [place].

## 2012-05-07 NOTE — Assessment & Plan Note (Signed)
resolved 

## 2012-05-07 NOTE — Assessment & Plan Note (Signed)
Check labs---only been on livalo 3 weeks

## 2012-05-10 ENCOUNTER — Other Ambulatory Visit: Payer: Self-pay | Admitting: Family Medicine

## 2012-06-21 ENCOUNTER — Other Ambulatory Visit: Payer: Self-pay | Admitting: Family Medicine

## 2012-08-05 ENCOUNTER — Ambulatory Visit (INDEPENDENT_AMBULATORY_CARE_PROVIDER_SITE_OTHER): Payer: BC Managed Care – PPO | Admitting: Physician Assistant

## 2012-08-05 VITALS — BP 118/73 | HR 81 | Temp 98.2°F | Resp 16 | Ht 64.0 in | Wt 176.4 lb

## 2012-08-05 DIAGNOSIS — J329 Chronic sinusitis, unspecified: Secondary | ICD-10-CM

## 2012-08-05 DIAGNOSIS — R059 Cough, unspecified: Secondary | ICD-10-CM

## 2012-08-05 DIAGNOSIS — R05 Cough: Secondary | ICD-10-CM

## 2012-08-05 MED ORDER — BENZONATATE 100 MG PO CAPS: 100.0000 mg | ORAL_CAPSULE | Freq: Three times a day (TID) | ORAL | Status: DC | PRN

## 2012-08-05 MED ORDER — IPRATROPIUM BROMIDE 0.03 % NA SOLN: 2.0000 | Freq: Two times a day (BID) | NASAL | Status: DC

## 2012-08-05 MED ORDER — CEFDINIR 300 MG PO CAPS: 600.0000 mg | ORAL_CAPSULE | Freq: Every day | ORAL | Status: DC

## 2012-08-05 NOTE — Progress Notes (Signed)
  Subjective:    Patient ID: Katie [name], female    DOB: 1966/07/26, 46 y.o.   MRN: [id number]  HPI 46 year old female presents with 2 week history of nasal congestion, cough, postnasal drainage, and rhinorrhea.  States symptoms started as a cold but have persisted despite taking Mucinex.  States this has not helped much.  Denies fever, chills, nausea, vomiting, headache, otalgia, sore throat, wheezing, or SOB. Admits cough is worse during the day and not really keeping her up at night.      Review of Systems  Constitutional: Negative for fever and chills.  HENT: Positive for congestion, rhinorrhea and postnasal drip. Negative for sore throat, neck pain and sinus pressure.   Respiratory: Positive for cough. Negative for chest tightness, shortness of breath and wheezing.   Cardiovascular: Negative for chest pain.  Gastrointestinal: Negative for vomiting, abdominal pain and diarrhea.  Skin: Negative for rash.  All other systems reviewed and are negative.       Objective:   Physical Exam  Constitutional: She is oriented to person, place, and time. She appears well-developed and well-nourished.  HENT:  Head: Normocephalic and atraumatic.  Right Ear: External ear normal.  Left Ear: External ear normal.  Mouth/Throat: Oropharynx is clear and moist. No oropharyngeal exudate (clear postnasal drainage).  Eyes: Conjunctivae normal are normal.  Neck: Normal range of motion. Neck supple.  Cardiovascular: Normal rate, regular rhythm and normal heart sounds.   Pulmonary/Chest: Effort normal and breath sounds normal.  Lymphadenopathy:    She has no cervical adenopathy.  Neurological: She is alert and oriented to person, place, and time.  Psychiatric: She has a normal mood and affect. Her behavior is normal. Judgment and thought content normal.          Assessment & Plan:   1. Sinusitis  cefdinir (OMNICEF) 300 MG capsule, ipratropium (ATROVENT) 0.03 % nasal spray  2. Cough  benzonatate  (TESSALON) 100 MG capsule   Omnicef 300 mg bid Atrovent NS bid Tessalon perles tid prn cough Increase fluids and rest Continue Mucinex Follow up if symptoms worsen or fail to improve.

## 2012-09-02 ENCOUNTER — Other Ambulatory Visit: Payer: Self-pay | Admitting: Family Medicine

## 2012-09-14 ENCOUNTER — Encounter: Payer: Self-pay | Admitting: Family Medicine

## 2012-09-15 ENCOUNTER — Emergency Department (HOSPITAL_BASED_OUTPATIENT_CLINIC_OR_DEPARTMENT_OTHER)
Admission: EM | Admit: 2012-09-15 | Discharge: 2012-09-15 | Disposition: A | Discharge status: Home / Self Care | Payer: BC Managed Care – PPO | Attending: Emergency Medicine | Admitting: Emergency Medicine

## 2012-09-15 ENCOUNTER — Encounter (HOSPITAL_BASED_OUTPATIENT_CLINIC_OR_DEPARTMENT_OTHER): Payer: Self-pay | Admitting: *Deleted

## 2012-09-15 DIAGNOSIS — E079 Disorder of thyroid, unspecified: Secondary | ICD-10-CM | POA: Insufficient documentation

## 2012-09-15 DIAGNOSIS — F411 Generalized anxiety disorder: Secondary | ICD-10-CM | POA: Insufficient documentation

## 2012-09-15 DIAGNOSIS — R51 Headache: Secondary | ICD-10-CM | POA: Insufficient documentation

## 2012-09-15 DIAGNOSIS — H6690 Otitis media, unspecified, unspecified ear: Secondary | ICD-10-CM

## 2012-09-15 DIAGNOSIS — Z79899 Other long term (current) drug therapy: Secondary | ICD-10-CM | POA: Insufficient documentation

## 2012-09-15 DIAGNOSIS — Z862 Personal history of diseases of the blood and blood-forming organs and certain disorders involving the immune mechanism: Secondary | ICD-10-CM | POA: Insufficient documentation

## 2012-09-15 DIAGNOSIS — R112 Nausea with vomiting, unspecified: Secondary | ICD-10-CM

## 2012-09-15 DIAGNOSIS — Z8669 Personal history of other diseases of the nervous system and sense organs: Secondary | ICD-10-CM | POA: Insufficient documentation

## 2012-09-15 DIAGNOSIS — Z87891 Personal history of nicotine dependence: Secondary | ICD-10-CM | POA: Insufficient documentation

## 2012-09-15 DIAGNOSIS — E785 Hyperlipidemia, unspecified: Secondary | ICD-10-CM | POA: Insufficient documentation

## 2012-09-15 MED ORDER — HYDROCODONE-ACETAMINOPHEN 5-325 MG PO TABS: 1.0000 | ORAL_TABLET | ORAL | Status: AC | PRN

## 2012-09-15 MED ORDER — ONDANSETRON 8 MG PO TBDP
8.0000 mg | ORAL_TABLET | Freq: Once | ORAL | Status: AC
  Administered 2012-09-15: 8 mg via ORAL

## 2012-09-15 MED ORDER — ONDANSETRON 8 MG PO TBDP
ORAL_TABLET | ORAL | Status: AC
  Filled 2012-09-15: qty 1

## 2012-09-15 MED ORDER — AMOXICILLIN 500 MG PO CAPS: ORAL_CAPSULE | ORAL | Status: DC

## 2012-09-15 NOTE — ED Provider Notes (Signed)
History     CSN: [id number]  Arrival date & time 09/15/12  4098   None     Chief Complaint  Patient presents with  . Otalgia  . Headache    (Consider location/radiation/quality/duration/timing/severity/associated sxs/prior treatment) Patient is a 47 y.o. female presenting with ear pain and headaches. The history is provided by the patient and medical records. No language interpreter was used.  Otalgia This is a new (The patient got sick with a headache 2 days ago, had onset of earache yesterday and today is feeling of nausea. There has been no vomiting or diarrhea she denies fever. She has had no exposure to others who are ill like this.) problem. The current episode started 2 days ago. There is pain in both ears. The problem occurs constantly. The problem has not changed since onset.There has been no fever. Associated symptoms include headaches. Past medical history comments: She has a prior history of sinusitis and ear infections..  Headache  Associated symptoms include nausea. Pertinent negatives include no fever.    Past Medical History  Diagnosis Date  . Thyroid disease   . RLS (restless legs syndrome)   . Hyperlipidemia   . Allergy   . Anemia   . Anxiety   . Blood transfusion without reported diagnosis     Past Surgical History  Procedure Date  . Tubal ligation 04/1999  . Carpal tunnel release     Bilateral  . Septoplasty 6/05  . Cryoablation 11/04  . Ectopic pregnancy surgery 1998  . Bunionectomy 06/04/2009    Right foot    Family History  Problem Relation Age of Onset  . Depression Father     Suicide  . Aneurysm Mother     Brain  . Diabetes Maternal Grandmother   . Alzheimer's disease Maternal Grandmother   . Uterine cancer Maternal Grandmother   . Hypertension Maternal Grandmother   . Osteoporosis Maternal Grandmother   . Lung cancer Maternal Grandfather     History  Substance Use Topics  . Smoking status: Former Smoker -- 1.0 packs/day    Quit  date: 01/14/1995  . Smokeless tobacco: Former Neurosurgeon    Quit date: 08/26/1995     Comment: started smoking at age 24 1ppd  . Alcohol Use: 0.5 oz/week    1 drink(s) per week    OB History    Grav Para Term Preterm Abortions TAB SAB Ect Mult Living                  Review of Systems  Constitutional: Negative.  Negative for fever and chills.  HENT: Positive for ear pain.   Eyes: Negative.   Respiratory: Negative.   Cardiovascular: Negative.   Gastrointestinal: Positive for nausea.  Genitourinary: Negative.   Musculoskeletal: Negative.   Skin: Negative.   Neurological: Positive for headaches.  Psychiatric/Behavioral: Negative.     Allergies  Morphine  Home Medications   Current Outpatient Rx  Name  Route  Sig  Dispense  Refill  . BENZONATATE 100 MG PO CAPS   Oral   Take 1-2 capsules (100-200 mg total) by mouth 3 (three) times daily as needed for cough.   40 capsule   0   . CEFDINIR 300 MG PO CAPS   Oral   Take 2 capsules (600 mg total) by mouth daily.   20 capsule   0   . DESVENLAFAXINE SUCCINATE ER 100 MG PO TB24   Oral   Take 1 tablet (100 mg total) by mouth daily.  30 tablet   11   . FENOFIBRATE 160 MG PO TABS      TAKE ONE TABLET BY MOUTH EVERY DAY   30 tablet   5   . IPRATROPIUM BROMIDE 0.03 % NA SOLN   Nasal   Place 2 sprays into the nose 2 (two) times daily.   30 mL   0   . LEVOTHYROXINE SODIUM 125 MCG PO TABS      TAKE ONE TABLET BY MOUTH EVERY DAY   90 tablet   1   . LIVALO 2 MG PO TABS      TAKE ONE TABLET BY MOUTH EVERY DAY AT BEDTIME   30 tablet   2   . QUETIAPINE FUMARATE 50 MG PO TABS   Oral   Take 1 tablet by mouth daily.           BP 136/99  Pulse 80  Temp 97.8 F (36.6 C) (Oral)  Resp 20  SpO2 100%  LMP 09/09/2012  Physical Exam  Nursing note and vitals reviewed. Constitutional: She is oriented to person, place, and time. She appears well-developed and well-nourished.       Patient complains of nausea and had  an episode of vomiting while was examining her.  HENT:  Head: Normocephalic and atraumatic.  Mouth/Throat: Oropharynx is clear and moist.       Both eardrums appear red and retracted.  Eyes: Conjunctivae normal and EOM are normal. Pupils are equal, round, and reactive to light.  Neck: Normal range of motion. Neck supple.  Cardiovascular: Normal rate, regular rhythm and normal heart sounds.   Pulmonary/Chest: Effort normal and breath sounds normal.  Abdominal: Soft. Bowel sounds are normal.  Musculoskeletal: Normal range of motion.  Neurological: She is alert and oriented to person, place, and time.       No sensory or motor deficit.  Skin: Skin is warm and dry.  Psychiatric: She has a normal mood and affect. Her behavior is normal.    ED Course  Procedures (including critical care time)  7:33 AM Patient was seen and had physical examination. She was provided with an emesis container and been asked after she had an episode of vomiting. Zofran ODT 8 mg was ordered. Once her vomiting has been controlled we will prescribe him amoxicillin 500 mg 3 times a day x10 days for her ear infection.    1. Nausea and vomiting   2. Otitis media               Carleene Cooper III, MD 09/15/12 [phone]

## 2012-09-15 NOTE — ED Notes (Signed)
Headache off and on since Saturday with some intermittent nausea. Bilateral ear pain onset yesterday

## 2012-10-01 ENCOUNTER — Telehealth: Payer: Self-pay | Admitting: Family Medicine

## 2012-10-01 NOTE — Telephone Encounter (Signed)
PA is in progress.      KP

## 2012-10-01 NOTE — Telephone Encounter (Signed)
Called stating they would be sending in a prior autho fir livalo & Pristiq, advised there would be a return fax # [phone] to get this PA expedited and would like to receive back today-pt was given 3pills at pharmacy to get her through. I did ask them to put to my attention so I could bring around once received Thanks

## 2012-10-04 ENCOUNTER — Ambulatory Visit (INDEPENDENT_AMBULATORY_CARE_PROVIDER_SITE_OTHER): Payer: BC Managed Care – PPO | Admitting: Family Medicine

## 2012-10-04 ENCOUNTER — Encounter: Payer: Self-pay | Admitting: Family Medicine

## 2012-10-04 VITALS — BP 112/80 | HR 69 | Temp 98.4°F | Wt 176.2 lb

## 2012-10-04 DIAGNOSIS — H6690 Otitis media, unspecified, unspecified ear: Secondary | ICD-10-CM

## 2012-10-04 DIAGNOSIS — F3289 Other specified depressive episodes: Secondary | ICD-10-CM

## 2012-10-04 DIAGNOSIS — F329 Major depressive disorder, single episode, unspecified: Secondary | ICD-10-CM

## 2012-10-04 DIAGNOSIS — F32A Depression, unspecified: Secondary | ICD-10-CM

## 2012-10-04 MED ORDER — VENLAFAXINE HCL ER 37.5 MG PO CP24: ORAL_CAPSULE | ORAL | Status: DC

## 2012-10-04 MED ORDER — CEFDINIR 300 MG PO CAPS: 300.0000 mg | ORAL_CAPSULE | Freq: Two times a day (BID) | ORAL | Status: DC

## 2012-10-04 NOTE — Patient Instructions (Signed)

## 2012-10-04 NOTE — Telephone Encounter (Signed)
Prior auth  Denied [employer]) the request does not meet the definition of medical necessity found in the member's benefit booklet.   Prior Auth approved Livalo 09-29-12 until 06-25-15

## 2012-10-04 NOTE — Telephone Encounter (Signed)
Pt changed to EFFEXOR XR

## 2012-10-04 NOTE — Telephone Encounter (Signed)
Pt coming in today

## 2012-10-04 NOTE — Progress Notes (Signed)
  Subjective:     Katie [name] is a 47 y.o. female who presents with ear pain and possible ear infection. Symptoms include: bilateral ear pain. Onset of symptoms was 1 days ago, and have been unchanged since that time. Associated symptoms include: congestion and post nasal drip.  Patient denies: achiness, chills, fever , headache, low grade fever, productive cough, sneezing and sore throat. She is drinking plenty of fluids.  The following portions of the patient's history were reviewed and updated as appropriate: allergies, current medications, past family history, past medical history, past social history, past surgical history and problem list.  Review of Systems Pertinent items are noted in HPI.   Objective:    BP 112/80  Pulse 69  Temp(Src) 98.4 F (36.9 C) (Oral)  Wt 176 lb 3.2 oz (79.924 kg)  BMI 30.23 kg/m2  SpO2 99%  LMP 09/09/2012 General:  alert, cooperative, appears stated age and no distress  Right Ear: diminished mobility  Left Ear: TM + errythema,  Dull,  + fluid  Mouth:  lips, mucosa, and tongue normal; teeth and gums normal  Neck: no adenopathy, supple, symmetrical, trachea midline and thyroid not enlarged, symmetric, no tenderness/mass/nodules     Assessment:    Left acute otitis media   Plan:    Treatment: omnicef. OTC analgesia as needed. Fluids, rest, avoid carbonated/alcoholic and caffeinated beverages.  Follow up in a few days if not improving.  Refer to ENT

## 2012-10-05 ENCOUNTER — Telehealth: Payer: Self-pay | Admitting: Family Medicine

## 2012-10-05 NOTE — Telephone Encounter (Signed)
We dont always have samples and her insurance won't pay for it unless she tries and fails effexor.  She may want to call her insurance company.

## 2012-10-05 NOTE — Telephone Encounter (Signed)
Discussed with patient and I made her aware of Dr.Lowne recommendations, se said she will not take the Effexor and she has taken other med's in the past and Dr.Lowne was working on it and she wanted to pick up the samples because she only had one left, I made her aware that was her choice not to take the medications and she had to do whatever makes her happy, I made her aware the samples will be left at check in for her to pick up tomorrow.     KP

## 2012-10-05 NOTE — Telephone Encounter (Signed)
Patient states she read about effexor and does not want to take it. She would like to continue with pristiq and keep trying to have her insurance co pay for it. She would like to continue getting samples. Please advise.

## 2012-10-15 ENCOUNTER — Encounter: Payer: Self-pay | Admitting: Family Medicine

## 2012-10-15 ENCOUNTER — Ambulatory Visit (INDEPENDENT_AMBULATORY_CARE_PROVIDER_SITE_OTHER): Payer: BC Managed Care – PPO | Admitting: Family Medicine

## 2012-10-15 VITALS — BP 116/90 | HR 76 | Temp 97.9°F | Ht 63.25 in | Wt 181.0 lb

## 2012-10-15 DIAGNOSIS — H6690 Otitis media, unspecified, unspecified ear: Secondary | ICD-10-CM

## 2012-10-15 NOTE — Progress Notes (Signed)
  Subjective:    Patient ID: Katie [name], female    DOB: [birth year], 47 y.o.   MRN: [id number]  HPI Ear pain- dx'd w/ L OM on 2/10.  tx'd w/ Omnicef.  Sees ENT on Monday.  'it's still hurting'.  No fever.  No nasal congestion, cough, sore throat, N/V.  Intermittent R ear pain.  Having L frontal pressure.  Some relief of ear pain w/ ibuprofen.  Taking Zyrtec daily.   Review of Systems For ROS see HPI     Objective:   Physical Exam  Vitals reviewed. Constitutional: She appears well-developed and well-nourished. No distress.  HENT:  Head: Normocephalic and atraumatic.  Right Ear: Tympanic membrane normal.  Left Ear: Tympanic membrane is injected (linear area of redness, appears consistent w/ blood vessel). Tympanic membrane is not perforated, not retracted and not bulging.  No middle ear effusion. No hemotympanum.  Nose: Mucosal edema and rhinorrhea present. Right sinus exhibits no maxillary sinus tenderness and no frontal sinus tenderness. Left sinus exhibits no maxillary sinus tenderness and no frontal sinus tenderness.  Mouth/Throat: Mucous membranes are normal. Posterior oropharyngeal erythema (w/ PND) present.  Eyes: Conjunctivae and EOM are normal. Pupils are equal, round, and reactive to light.  Neck: Normal range of motion. Neck supple.  Cardiovascular: Normal rate, regular rhythm and normal heart sounds.   Pulmonary/Chest: Effort normal and breath sounds normal. No respiratory distress. She has no wheezes. She has no rales.  Lymphadenopathy:    She has no cervical adenopathy.          Assessment & Plan:

## 2012-10-15 NOTE — Patient Instructions (Addendum)
There is no evidence of current infection See ENT as scheduled Start OTC Sudafed or similar decongestant to decrease congestion and pressure Continue ibuprofen as needed for pain Drink plenty of fluids Hang in there!

## 2012-10-17 NOTE — Assessment & Plan Note (Signed)
No evidence of ongoing infxn.  Has appt upcoming w/ ENT.  No need for repeat abx.  Encouraged daily allergy meds to decrease congestion and eustachian tube pressure.  Reviewed supportive care and red flags that should prompt return.  Pt expressed understanding and is in agreement w/ plan.

## 2012-11-08 ENCOUNTER — Telehealth: Payer: Self-pay | Admitting: Family Medicine

## 2012-11-08 NOTE — Telephone Encounter (Signed)
Discuss with patient, discount cards placed up front.

## 2012-11-08 NOTE — Telephone Encounter (Signed)
PT WOULD LIKE TO KNOW IF THERE IS A COPAY CARD FOR ONE HER Prescriptions "[facility]" cb# [phone]

## 2012-11-16 ENCOUNTER — Telehealth: Payer: Self-pay | Admitting: Family Medicine

## 2012-11-16 ENCOUNTER — Other Ambulatory Visit: Payer: Self-pay | Admitting: Family Medicine

## 2012-11-16 DIAGNOSIS — E039 Hypothyroidism, unspecified: Secondary | ICD-10-CM

## 2012-11-16 NOTE — Telephone Encounter (Signed)
Order in.

## 2012-11-16 NOTE — Telephone Encounter (Signed)
Order has been placed.//AB/CMA

## 2012-11-16 NOTE — Telephone Encounter (Signed)
Patient states she needs to have her thyroid rechecked. Can you check the patient's chart and place orders?

## 2012-11-21 ENCOUNTER — Other Ambulatory Visit: Payer: Self-pay | Admitting: Family Medicine

## 2012-12-02 ENCOUNTER — Other Ambulatory Visit: Payer: Self-pay | Admitting: Dermatology

## 2012-12-06 ENCOUNTER — Telehealth: Payer: Self-pay | Admitting: Family Medicine

## 2012-12-06 MED ORDER — PITAVASTATIN CALCIUM 2 MG PO TABS: ORAL_TABLET | ORAL | Status: DC

## 2012-12-06 NOTE — Telephone Encounter (Signed)
Refill: Livalo 2 mg tab. Take one tablet by mouth at bedtime. Qty 30. Last fill 11-01-12

## 2013-01-03 ENCOUNTER — Other Ambulatory Visit (INDEPENDENT_AMBULATORY_CARE_PROVIDER_SITE_OTHER): Payer: BC Managed Care – PPO

## 2013-01-03 ENCOUNTER — Encounter: Payer: Self-pay | Admitting: Family Medicine

## 2013-01-03 DIAGNOSIS — E039 Hypothyroidism, unspecified: Secondary | ICD-10-CM

## 2013-01-04 ENCOUNTER — Telehealth: Payer: Self-pay | Admitting: *Deleted

## 2013-01-04 DIAGNOSIS — E785 Hyperlipidemia, unspecified: Secondary | ICD-10-CM

## 2013-01-04 LAB — T3, FREE: T3, Free: 2.6 pg/mL (ref 2.3–4.2)

## 2013-01-04 LAB — T4, FREE: Free T4: 1.04 ng/dL (ref 0.60–1.60)

## 2013-01-04 LAB — TSH: TSH: 0.89 u[IU]/mL (ref 0.35–5.50)

## 2013-01-04 NOTE — Telephone Encounter (Signed)
Pt schedule for appt will await results before sending in Rx. Pt is not out of med and has enough for this week.

## 2013-01-04 NOTE — Telephone Encounter (Signed)
Last OV 10-15-12, last filled 12-06-12 #30, last lipid 05-07-12

## 2013-01-04 NOTE — Telephone Encounter (Signed)
Pt needs labs----schedule then fill 1 month

## 2013-01-05 ENCOUNTER — Other Ambulatory Visit (INDEPENDENT_AMBULATORY_CARE_PROVIDER_SITE_OTHER): Payer: BC Managed Care – PPO

## 2013-01-05 DIAGNOSIS — E785 Hyperlipidemia, unspecified: Secondary | ICD-10-CM

## 2013-01-05 LAB — LIPID PANEL
Cholesterol: 129 mg/dL (ref 0–200)
HDL: 47 mg/dL (ref >39.00)
LDL Cholesterol: 66 mg/dL (ref 0–99)
Total CHOL/HDL Ratio: 3
Triglycerides: 79 mg/dL (ref 0.0–149.0)
VLDL: 15.8 mg/dL (ref 0.0–40.0)

## 2013-01-05 LAB — HEPATIC FUNCTION PANEL
ALT: 65 U/L — ABNORMAL HIGH (ref 0–35)
AST: 51 U/L — ABNORMAL HIGH (ref 0–37)
Albumin: 4.1 g/dL (ref 3.5–5.2)
Alkaline Phosphatase: 49 U/L (ref 39–117)
Bilirubin, Direct: 0 mg/dL (ref 0.0–0.3)
Total Bilirubin: 0.4 mg/dL (ref 0.3–1.2)
Total Protein: 7.5 g/dL (ref 6.0–8.3)

## 2013-01-06 MED ORDER — PITAVASTATIN CALCIUM 2 MG PO TABS: ORAL_TABLET | ORAL | Status: DC

## 2013-01-06 NOTE — Telephone Encounter (Signed)
Rx sent 

## 2013-01-10 ENCOUNTER — Encounter: Payer: Self-pay | Admitting: *Deleted

## 2013-01-24 ENCOUNTER — Other Ambulatory Visit: Payer: Self-pay | Admitting: Family Medicine

## 2013-01-26 ENCOUNTER — Ambulatory Visit (INDEPENDENT_AMBULATORY_CARE_PROVIDER_SITE_OTHER): Payer: BC Managed Care – PPO | Admitting: Internal Medicine

## 2013-01-26 ENCOUNTER — Encounter: Payer: Self-pay | Admitting: Internal Medicine

## 2013-01-26 VITALS — BP 116/72 | HR 68 | Temp 98.1°F | Wt 183.0 lb

## 2013-01-26 DIAGNOSIS — J329 Chronic sinusitis, unspecified: Secondary | ICD-10-CM

## 2013-01-26 MED ORDER — AMOXICILLIN 500 MG PO CAPS: 1000.0000 mg | ORAL_CAPSULE | Freq: Two times a day (BID) | ORAL | Status: DC

## 2013-01-26 MED ORDER — PREDNISONE 10 MG PO TABS: ORAL_TABLET | ORAL | Status: DC

## 2013-01-26 MED ORDER — AZELASTINE HCL 0.1 % NA SOLN: 2.0000 | Freq: Two times a day (BID) | NASAL | Status: DC

## 2013-01-26 NOTE — Patient Instructions (Addendum)
Continue with Zyrtec, Sudafed and Nasacort In addition take the new nasal spray twice a day Amoxicillin for 10 days Prednisone for a few days Mucinex one tablet twice a day for 10 days Please see your ENT again or come back and see your PCP in 2-3 weeks

## 2013-01-26 NOTE — Progress Notes (Signed)
  Subjective:    Patient ID: Katie [name], female    DOB: April 27, 1966, 47 y.o.   MRN: [id number]  HPI Acute visit Was seen 09/2012 with respiratory symptoms, got better for a brief period of time but  the upper respiratory symptoms resurface by April, getting worse for the last week: + Facial pain, ear pressure, pain "behind the eyes". She's taking Zyrtec, Sudafed, Nasacort without much help. She went to a  ENT specialist since the last time she was here, was recommended to go back to him whenever she gets sick however due to the cost she has been unable to see them Again.  Past Medical History  Diagnosis Date  . Thyroid disease   . RLS (restless legs syndrome)   . Hyperlipidemia   . Allergy   . Anemia   . Anxiety   . Blood transfusion without reported diagnosis    Past Surgical History  Procedure Laterality Date  . Tubal ligation  04/1999  . Carpal tunnel release      Bilateral  . Septoplasty  6/05  . Cryoablation  11/04  . Ectopic pregnancy surgery  1998  . Bunionectomy  06/04/2009    Right foot   History  Substance Use Topics  . Smoking status: Former Smoker -- 1.00 packs/day    Quit date: 01/14/1995  . Smokeless tobacco: Former Neurosurgeon    Quit date: 08/26/1995     Comment: started smoking at age 29 1ppd  . Alcohol Use: 0.5 oz/week    1 drink(s) per week     Review of Systems Mild subjective fever for the last few days, and no chills. No sneezing, some itchy eyes, no itchy nose. Small amount of clear nasal discharge noted No cough or chest congestion.     Objective:   Physical Exam BP 116/72  Pulse 68  Temp(Src) 98.1 F (36.7 C) (Oral)  Wt 183 lb (83.008 kg)  BMI 32.14 kg/m2  SpO2 97%  General -- alert, well-developed, NAD .   HEENT -- TMs Right is normal, left is a slightly bulge but no discharge. Tonsils not seen. Nose quite congested, no discharge. Face symmetric and slt tender to palpation at both the maxillary and frontal sinuses bilaterally.  EOMI. Lungs -- normal respiratory effort, no intercostal retractions, no accessory muscle use, and normal breath sounds.   Heart-- normal rate, regular rhythm, no murmur, and no gallop.    Neurologic-- alert & oriented X3 and strength normal in all extremities. Psych-- Cognition and judgment appear intact. Alert and cooperative with normal attention span and concentration.  not anxious appearing and not depressed appearing.       Assessment & Plan:   Chronic sinusitis with acute exacerbation. Has been seen 4 times since December with upper respiratory symptoms. We will add Astelin to her regimen. Also amoxicillin and prednisone for a few days. Recommend to come back and see PCP or see her ENT , may benefit from further evaluation such as a sinus CT, etc

## 2013-02-17 ENCOUNTER — Ambulatory Visit (INDEPENDENT_AMBULATORY_CARE_PROVIDER_SITE_OTHER): Payer: BC Managed Care – PPO | Admitting: Family Medicine

## 2013-02-17 ENCOUNTER — Encounter: Payer: Self-pay | Admitting: Family Medicine

## 2013-02-17 VITALS — BP 122/84 | HR 91 | Temp 99.2°F

## 2013-02-17 DIAGNOSIS — J309 Allergic rhinitis, unspecified: Secondary | ICD-10-CM

## 2013-02-17 DIAGNOSIS — J302 Other seasonal allergic rhinitis: Secondary | ICD-10-CM

## 2013-02-17 MED ORDER — LORATADINE 10 MG PO TABS: 10.0000 mg | ORAL_TABLET | Freq: Every day | ORAL | Status: DC

## 2013-02-17 MED ORDER — AZELASTINE-FLUTICASONE 137-50 MCG/ACT NA SUSP: 1.0000 | Freq: Two times a day (BID) | NASAL | Status: DC

## 2013-02-17 MED ORDER — DESVENLAFAXINE SUCCINATE ER 50 MG PO TB24: 50.0000 mg | ORAL_TABLET | Freq: Every day | ORAL | Status: DC

## 2013-02-17 NOTE — Progress Notes (Signed)
  Subjective:     Katie [name] is a 47 y.o. female who presents for evaluation of symptoms of a URI. Symptoms include congestion, nasal congestion, non productive cough and sinus pressure. Onset of symptoms was since before last ov - , and has been unchanged since that time. Treatment to date: antibiotics, antihistamines and nasal steroids.  The following portions of the patient's history were reviewed and updated as appropriate: allergies, current medications, past family history, past medical history, past social history, past surgical history and problem list.  Review of Systems Pertinent items are noted in HPI.   Objective:    BP 122/84  Pulse 91  Temp(Src) 99.2 F (37.3 C) (Oral)  SpO2 97%  LMP 01/17/2013 General appearance: alert, cooperative, appears stated age and no distress Ears: normal TM's and external ear canals both ears Nose: moderate congestion, turbinates red, swollen, no sinus tenderness Throat: lips, mucosa, and tongue normal; teeth and gums normal Neck: no adenopathy, supple, symmetrical, trachea midline and thyroid not enlarged, symmetric, no tenderness/mass/nodules Lungs: clear to auscultation bilaterally Heart: S1, S2 normal   Assessment:    allergic rhinitis   Plan:    Discussed diagnosis and treatment of URI. Suggested symptomatic OTC remedies. Nasal saline spray for congestion. Follow up as needed. dymista   Pt may need allergist if no relief

## 2013-02-17 NOTE — Patient Instructions (Addendum)
Allergies, Generic  Allergies may happen from anything your body is sensitive to. This may be food, medicines, pollens, chemicals, and nearly anything around you in everyday life that produces allergens. An allergen is anything that causes an allergy producing substance. Heredity is often a factor in causing these problems. This means you may have some of the same allergies as your parents.  Food allergies happen in all age groups. Food allergies are some of the most severe and life threatening. Some common food allergies are cow's milk, seafood, eggs, nuts, wheat, and soybeans.  SYMPTOMS    Swelling around the mouth.   An itchy red rash or hives.   Vomiting or diarrhea.   Difficulty breathing.  SEVERE ALLERGIC REACTIONS ARE LIFE-THREATENING.  This reaction is called anaphylaxis. It can cause the mouth and throat to swell and cause difficulty with breathing and swallowing. In severe reactions only a trace amount of food (for example, peanut oil in a salad) may cause death within seconds.  Seasonal allergies occur in all age groups. These are seasonal because they usually occur during the same season every year. They may be a reaction to molds, grass pollens, or tree pollens. Other causes of problems are house dust mite allergens, pet dander, and mold spores. The symptoms often consist of nasal congestion, a runny itchy nose associated with sneezing, and tearing itchy eyes. There is often an associated itching of the mouth and ears. The problems happen when you come in contact with pollens and other allergens. Allergens are the particles in the air that the body reacts to with an allergic reaction. This causes you to release allergic antibodies. Through a chain of events, these eventually cause you to release histamine into the blood stream. Although it is meant to be protective to the body, it is this release that causes your discomfort. This is why you were given anti-histamines to feel better. If you are  unable to pinpoint the offending allergen, it may be determined by skin or blood testing. Allergies cannot be cured but can be controlled with medicine.  Hay fever is a collection of all or some of the seasonal allergy problems. It may often be treated with simple over-the-counter medicine such as diphenhydramine. Take medicine as directed. Do not drink alcohol or drive while taking this medicine. Check with your caregiver or package insert for child dosages.  If these medicines are not effective, there are many new medicines your caregiver can prescribe. Stronger medicine such as nasal spray, eye drops, and corticosteroids may be used if the first things you try do not work well. Other treatments such as immunotherapy or desensitizing injections can be used if all else fails. Follow up with your caregiver if problems continue. These seasonal allergies are usually not life threatening. They are generally more of a nuisance that can often be handled using medicine.  HOME CARE INSTRUCTIONS    If unsure what causes a reaction, keep a diary of foods eaten and symptoms that follow. Avoid foods that cause reactions.   If hives or rash are present:   Take medicine as directed.   You may use an over-the-counter antihistamine (diphenhydramine) for hives and itching as needed.   Apply cold compresses (cloths) to the skin or take baths in cool water. Avoid hot baths or showers. Heat will make a rash and itching worse.   If you are severely allergic:   Following a treatment for a severe reaction, hospitalization is often required for closer follow-up.     Wear a medic-alert bracelet or necklace stating the allergy.   You and your family must learn how to give adrenaline or use an anaphylaxis kit.   If you have had a severe reaction, always carry your anaphylaxis kit or EpiPen with you. Use this medicine as directed by your caregiver if a severe reaction is occurring. Failure to do so could have a fatal outcome.  SEEK  MEDICAL CARE IF:   You suspect a food allergy. Symptoms generally happen within 30 minutes of eating a food.   Your symptoms have not gone away within 2 days or are getting worse.   You develop new symptoms.   You want to retest yourself or your child with a food or drink you think causes an allergic reaction. Never do this if an anaphylactic reaction to that food or drink has happened before. Only do this under the care of a caregiver.  SEEK IMMEDIATE MEDICAL CARE IF:    You have difficulty breathing, are wheezing, or have a tight feeling in your chest or throat.   You have a swollen mouth, or you have hives, swelling, or itching all over your body.   You have had a severe reaction that has responded to your anaphylaxis kit or an EpiPen. These reactions may return when the medicine has worn off. These reactions should be considered life threatening.  MAKE SURE YOU:    Understand these instructions.   Will watch your condition.   Will get help right away if you are not doing well or get worse.  Document Released: 11/04/2002 Document Revised: 11/03/2011 Document Reviewed: 04/10/2008  [employer] Patient Information 2014 [employer].

## 2013-03-02 ENCOUNTER — Ambulatory Visit (HOSPITAL_BASED_OUTPATIENT_CLINIC_OR_DEPARTMENT_OTHER)
Admission: RE | Admit: 2013-03-02 | Discharge: 2013-03-02 | Disposition: A | Discharge status: Home / Self Care | Payer: BC Managed Care – PPO | Source: Ambulatory Visit | Attending: Family Medicine | Admitting: Family Medicine

## 2013-03-02 ENCOUNTER — Encounter: Payer: Self-pay | Admitting: Family Medicine

## 2013-03-02 ENCOUNTER — Other Ambulatory Visit: Payer: Self-pay | Admitting: Family Medicine

## 2013-03-02 ENCOUNTER — Ambulatory Visit (INDEPENDENT_AMBULATORY_CARE_PROVIDER_SITE_OTHER): Payer: BC Managed Care – PPO | Admitting: Family Medicine

## 2013-03-02 VITALS — BP 120/80 | HR 81 | Temp 98.5°F | Wt 184.6 lb

## 2013-03-02 DIAGNOSIS — M79609 Pain in unspecified limb: Secondary | ICD-10-CM | POA: Insufficient documentation

## 2013-03-02 DIAGNOSIS — M2550 Pain in unspecified joint: Secondary | ICD-10-CM | POA: Insufficient documentation

## 2013-03-02 LAB — CBC WITH DIFFERENTIAL/PLATELET
Basophils Absolute: 0 10*3/uL (ref 0.0–0.1)
Basophils Relative: 0.6 % (ref 0.0–3.0)
Eosinophils Absolute: 0.1 10*3/uL (ref 0.0–0.7)
Eosinophils Relative: 2.5 % (ref 0.0–5.0)
HCT: 40.7 % (ref 36.0–46.0)
Hemoglobin: 13.7 g/dL (ref 12.0–15.0)
Lymphocytes Relative: 41.3 % (ref 12.0–46.0)
Lymphs Abs: 1.7 10*3/uL (ref 0.7–4.0)
MCHC: 33.7 g/dL (ref 30.0–36.0)
MCV: 89.6 fl (ref 78.0–100.0)
Monocytes Absolute: 0.4 10*3/uL (ref 0.1–1.0)
Monocytes Relative: 9.8 % (ref 3.0–12.0)
Neutro Abs: 1.9 10*3/uL (ref 1.4–7.7)
Neutrophils Relative %: 45.8 % (ref 43.0–77.0)
Platelets: 313 10*3/uL (ref 150.0–400.0)
RBC: 4.54 Mil/uL (ref 3.87–5.11)
RDW: 12.7 % (ref 11.5–14.6)
WBC: 4.1 10*3/uL — ABNORMAL LOW (ref 4.5–10.5)

## 2013-03-02 LAB — BASIC METABOLIC PANEL
BUN: 18 mg/dL (ref 6–23)
CO2: 29 mEq/L (ref 19–32)
Calcium: 9.6 mg/dL (ref 8.4–10.5)
Chloride: 105 mEq/L (ref 96–112)
Creatinine, Ser: 1 mg/dL (ref 0.4–1.2)
GFR: 67.66 mL/min (ref >60.00)
Glucose, Bld: 96 mg/dL (ref 70–99)
Potassium: 4.4 mEq/L (ref 3.5–5.1)
Sodium: 140 mEq/L (ref 135–145)

## 2013-03-02 LAB — SEDIMENTATION RATE: Sed Rate: 15 mm/hr (ref 0–22)

## 2013-03-02 NOTE — Progress Notes (Signed)
  Subjective:    Patient ID: Katie [name], female    DOB: May 30, 1966, 47 y.o.   MRN: [id number]  HPI Pt here c/o pain in L thenar emminence and  DIP of R 5th digit and some other aches and pains x 1 month.  No known injury. Aleve took the edge off but she still had pain.  Symptoms x 1 month.   Review of Systems As above    Objective:   Physical Exam BP 120/80  Pulse 81  Temp(Src) 98.5 F (36.9 C) (Oral)  Wt 184 lb 9.6 oz (83.734 kg)  BMI 32.42 kg/m2  SpO2 97%  LMP 01/17/2013 General appearance: alert, cooperative, appears stated age and no distress Extremities: pain base L thumb and dip R 5 th digit dip, no swelling, no errythema       Assessment & Plan:  Joint pains--- check labs                          celebrex 200 mg 1 po qd                          rto prn

## 2013-03-02 NOTE — Patient Instructions (Signed)
Arthralgia Your caregiver has diagnosed you as suffering from an arthralgia. Arthralgia means there is pain in a joint. This can come from many reasons including:  Bruising the joint which causes soreness (inflammation) in the joint.  Wear and tear on the joints which occur as we grow older (osteoarthritis).  Overusing the joint.  Various forms of arthritis.  Infections of the joint. Regardless of the cause of pain in your joint, most of these different pains respond to anti-inflammatory drugs and rest. The exception to this is when a joint is infected, and these cases are treated with antibiotics, if it is a bacterial infection. HOME CARE INSTRUCTIONS   Rest the injured area for as long as directed by your caregiver. Then slowly start using the joint as directed by your caregiver and as the pain allows. Crutches as directed may be useful if the ankles, knees or hips are involved. If the knee was splinted or casted, continue use and care as directed. If an stretchy or elastic wrapping bandage has been applied today, it should be removed and re-applied every 3 to 4 hours. It should not be applied tightly, but firmly enough to keep swelling down. Watch toes and feet for swelling, bluish discoloration, coldness, numbness or excessive pain. If any of these problems (symptoms) occur, remove the ace bandage and re-apply more loosely. If these symptoms persist, contact your caregiver or return to this location.  For the first 24 hours, keep the injured extremity elevated on pillows while lying down.  Apply ice for 15-20 minutes to the sore joint every couple hours while awake for the first half day. Then 3-4 times per day for the first 48 hours. Put the ice in a plastic bag and place a towel between the bag of ice and your skin.  Wear any splinting, casting, elastic bandage applications, or slings as instructed.  Only take over-the-counter or prescription medicines for pain, discomfort, or fever as  directed by your caregiver. Do not use aspirin immediately after the injury unless instructed by your physician. Aspirin can cause increased bleeding and bruising of the tissues.  If you were given crutches, continue to use them as instructed and do not resume weight bearing on the sore joint until instructed. Persistent pain and inability to use the sore joint as directed for more than 2 to 3 days are warning signs indicating that you should see a caregiver for a follow-up visit as soon as possible. Initially, a hairline fracture (break in bone) may not be evident on X-rays. Persistent pain and swelling indicate that further evaluation, non-weight bearing or use of the joint (use of crutches or slings as instructed), or further X-rays are indicated. X-rays may sometimes not show a small fracture until a week or 10 days later. Make a follow-up appointment with your own caregiver or one to whom we have referred you. A radiologist (specialist in reading X-rays) may read your X-rays. Make sure you know how you are to obtain your X-ray results. Do not assume everything is normal if you do not hear from us. SEEK MEDICAL CARE IF: Bruising, swelling, or pain increases. SEEK IMMEDIATE MEDICAL CARE IF:   Your fingers or toes are numb or blue.  The pain is not responding to medications and continues to stay the same or get worse.  The pain in your joint becomes severe.  You develop a fever over 102 F (38.9 C).  It becomes impossible to move or use the joint. MAKE SURE YOU:     Understand these instructions.  Will watch your condition.  Will get help right away if you are not doing well or get worse. Document Released: 08/11/2005 Document Revised: 11/03/2011 Document Reviewed: 03/29/2008 [employer] Patient Information 2014 [employer].  

## 2013-03-03 LAB — RHEUMATOID FACTOR: Rhuematoid fact SerPl-aCnc: <10 IU/mL (ref <=14)

## 2013-03-03 LAB — ANA: Anti Nuclear Antibody(ANA): NEGATIVE

## 2013-03-10 ENCOUNTER — Encounter: Payer: Self-pay | Admitting: Family Medicine

## 2013-03-10 DIAGNOSIS — J302 Other seasonal allergic rhinitis: Secondary | ICD-10-CM

## 2013-03-10 MED ORDER — CELECOXIB 200 MG PO CAPS: 200.0000 mg | ORAL_CAPSULE | Freq: Every day | ORAL | Status: DC | PRN

## 2013-03-10 MED ORDER — AZELASTINE-FLUTICASONE 137-50 MCG/ACT NA SUSP: 1.0000 | Freq: Two times a day (BID) | NASAL | Status: DC

## 2013-03-10 NOTE — Telephone Encounter (Signed)
Please advise      KP 

## 2013-03-12 ENCOUNTER — Encounter: Payer: Self-pay | Admitting: Family Medicine

## 2013-03-14 ENCOUNTER — Ambulatory Visit (INDEPENDENT_AMBULATORY_CARE_PROVIDER_SITE_OTHER): Payer: BC Managed Care – PPO | Admitting: Family Medicine

## 2013-03-14 ENCOUNTER — Other Ambulatory Visit (HOSPITAL_COMMUNITY)
Admission: RE | Admit: 2013-03-14 | Discharge: 2013-03-14 | Disposition: A | Discharge status: Home / Self Care | Payer: BC Managed Care – PPO | Source: Ambulatory Visit | Attending: Family Medicine | Admitting: Family Medicine

## 2013-03-14 ENCOUNTER — Encounter: Payer: Self-pay | Admitting: Family Medicine

## 2013-03-14 VITALS — BP 110/76 | HR 75 | Temp 98.2°F | Ht 63.5 in | Wt 185.8 lb

## 2013-03-14 DIAGNOSIS — Z1151 Encounter for screening for human papillomavirus (HPV): Secondary | ICD-10-CM | POA: Insufficient documentation

## 2013-03-14 DIAGNOSIS — Z0000 Encounter for general adult medical examination without abnormal findings: Secondary | ICD-10-CM

## 2013-03-14 DIAGNOSIS — Z01419 Encounter for gynecological examination (general) (routine) without abnormal findings: Secondary | ICD-10-CM | POA: Insufficient documentation

## 2013-03-14 DIAGNOSIS — E669 Obesity, unspecified: Secondary | ICD-10-CM | POA: Insufficient documentation

## 2013-03-14 DIAGNOSIS — G4730 Sleep apnea, unspecified: Secondary | ICD-10-CM

## 2013-03-14 DIAGNOSIS — Z124 Encounter for screening for malignant neoplasm of cervix: Secondary | ICD-10-CM

## 2013-03-14 DIAGNOSIS — F411 Generalized anxiety disorder: Secondary | ICD-10-CM

## 2013-03-14 DIAGNOSIS — E039 Hypothyroidism, unspecified: Secondary | ICD-10-CM

## 2013-03-14 DIAGNOSIS — R748 Abnormal levels of other serum enzymes: Secondary | ICD-10-CM

## 2013-03-14 DIAGNOSIS — E785 Hyperlipidemia, unspecified: Secondary | ICD-10-CM

## 2013-03-14 MED ORDER — DESVENLAFAXINE SUCCINATE ER 50 MG PO TB24: 50.0000 mg | ORAL_TABLET | Freq: Every day | ORAL | Status: DC

## 2013-03-14 NOTE — Progress Notes (Signed)
Subjective:     Katie [name] is a 47 y.o. female and is here for a comprehensive physical exam. The patient reports no problems.  History   Social History  . Marital Status: Married    Spouse Name: N/A    Number of Children: 2  . Years of Education: 15   Occupational History  . 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 Smoking status: Former Smoker -- 1.00 packs/day    Quit date: 01/14/1995  . Smokeless tobacco: Former Neurosurgeon    Quit date: 08/26/1995     Comment: started smoking at age 60 1ppd  . Alcohol Use: 0.5 oz/week    1 drink(s) per week  . Drug Use: No  . Sexually Active: Yes -- Female partner(s)   Other Topics Concern  . Not on file   Social History Narrative   Exercise-- no   Health Maintenance  Topic Date Due  . Influenza Vaccine  04/25/2013  . Mammogram  07/09/2013  . Pap Smear  01/13/2014  . Tetanus/tdap  06/20/2018    The following portions of the patient's history were reviewed and updated as appropriate:  She  has a past medical history of Thyroid disease; RLS (restless legs syndrome); Hyperlipidemia; Allergy; Anemia; Anxiety; and Blood transfusion without reported diagnosis. She  does not have any pertinent problems on file. She  has past surgical history that includes Tubal ligation (04/1999); Carpal tunnel release; Septoplasty (6/05); Cryoablation (11/04); Ectopic pregnancy surgery (1998); and Bunionectomy (06/04/2009). Her family history includes Alzheimer's disease in her maternal grandmother; Aneurysm in her mother; Depression in her father; Diabetes in her maternal grandmother; Hypertension in her maternal grandmother; Lung cancer in her maternal grandfather; Osteoporosis in her maternal grandmother; and Uterine cancer in her maternal grandmother. She  reports that she quit smoking about 18 years ago. She quit smokeless tobacco use about 17 years ago. She reports that she drinks about 0.5 ounces of alcohol per week. She reports that  she does not use illicit drugs. She has a current medication list which includes the following prescription(s): azelastine-fluticasone, celecoxib, cetirizine, desvenlafaxine, fenofibrate, levothyroxine, pitavastatin calcium, and quetiapine. Current Outpatient Prescriptions on File Prior to Visit  Medication Sig Dispense Refill  . Azelastine-Fluticasone 137-50 MCG/ACT SUSP Place 1 spray into the nose 2 (two) times daily.  23 g  5  . celecoxib (CELEBREX) 200 MG capsule Take 1 capsule (200 mg total) by mouth daily as needed for pain.  30 capsule  5  . cetirizine (ZYRTEC) 10 MG tablet Take 10 mg by mouth daily.      . fenofibrate 160 MG tablet TAKE ONE TABLET BY MOUTH ONCE DAILY  30 tablet  2  . levothyroxine (SYNTHROID, LEVOTHROID) 125 MCG tablet TAKE ONE TABLET BY MOUTH EVERY DAY  90 tablet  3  . Pitavastatin Calcium (LIVALO) 2 MG TABS TAKE ONE TABLET BY MOUTH EVERY DAY AT BEDTIME--repeat labs are due now  30 tablet  6  . QUEtiapine (SEROQUEL) 50 MG tablet Take 1 tablet by mouth daily.       No current facility-administered medications on file prior to visit.   She is allergic to morphine..  Review of Systems Review of Systems  Constitutional: Negative for activity change, appetite change and fatigue.  HENT: Negative for hearing loss, congestion, tinnitus and ear discharge.  dentist q48m Eyes: Negative for visual disturbance (see optho q1y -- vision corrected to 20/20 with glasses).  Respiratory: Negative for cough, chest tightness and shortness  of breath.   Cardiovascular: Negative for chest pain, palpitations and leg swelling.  Gastrointestinal: Negative for abdominal pain, diarrhea, constipation and abdominal distention.  Genitourinary: Negative for urgency, frequency, decreased urine volume and difficulty urinating.  Musculoskeletal: Negative for back pain, arthralgias and gait problem.  Skin: Negative for color change, pallor and rash.  Neurological: Negative for dizziness,  light-headedness, numbness and headaches.  Hematological: Negative for adenopathy. Does not bruise/bleed easily.  Psychiatric/Behavioral: Negative for suicidal ideas, confusion, sleep disturbance, self-injury, dysphoric mood, decreased concentration and agitation.       Objective:    BP 110/76  Pulse 75  Temp(Src) 98.2 F (36.8 C) (Oral)  Ht 5' 3.5" (1.613 m)  Wt 185 lb 12.8 oz (84.278 kg)  BMI 32.39 kg/m2  SpO2 96%  LMP 12/28/2012 General appearance: alert, cooperative, appears stated age and no distress Head: Normocephalic, without obvious abnormality, atraumatic Eyes: conjunctivae/corneas clear. PERRL, EOM's intact. Fundi benign. Ears: normal TM's and external ear canals both ears Nose: Nares normal. Septum midline. Mucosa normal. No drainage or sinus tenderness. Throat: lips, mucosa, and tongue normal; teeth and gums normal Neck: no adenopathy, no carotid bruit, no JVD, supple, symmetrical, trachea midline and thyroid not enlarged, symmetric, no tenderness/mass/nodules Back: symmetric, no curvature. ROM normal. No CVA tenderness. Lungs: clear to auscultation bilaterally Breasts: normal appearance, no masses or tenderness Heart: regular rate and rhythm, S1, S2 normal, no murmur, click, rub or gallop Abdomen: soft, non-tender; bowel sounds normal; no masses,  no organomegaly Pelvic: cervix normal in appearance, external genitalia normal, no adnexal masses or tenderness, no cervical motion tenderness, rectovaginal septum normal, uterus normal size, shape, and consistency and vagina normal without discharge  Rectal- heme neg brown stool Extremities: extremities normal, atraumatic, no cyanosis or edema Pulses: 2+ and symmetric Skin: Skin color, texture, turgor normal. No rashes or lesions Lymph nodes: Cervical, supraclavicular, and axillary nodes normal. Neurologic: Alert and oriented X 3, normal strength and tone. Normal symmetric reflexes. Normal coordination and gait Psych--  no depression, no anxiety      Assessment:    Healthy female exam.      Plan:    ghm utd Check labs See After Visit Summary for Counseling Recommendations

## 2013-03-14 NOTE — Patient Instructions (Addendum)
Preventive Care for Adults, Female A healthy lifestyle and preventive care can promote health and wellness. Preventive health guidelines for women include the following key practices.  A routine yearly physical is a good way to check with your caregiver about your health and preventive screening. It is a chance to share any concerns and updates on your health, and to receive a thorough exam.  Visit your dentist for a routine exam and preventive care every 6 months. Brush your teeth twice a day and floss once a day. Good oral hygiene prevents tooth decay and gum disease.  The frequency of eye exams is based on your age, health, family medical history, use of contact lenses, and other factors. Follow your caregiver's recommendations for frequency of eye exams.  Eat a healthy diet. Foods like vegetables, fruits, whole grains, low-fat dairy products, and lean protein foods contain the nutrients you need without too many calories. Decrease your intake of foods high in solid fats, added sugars, and salt. Eat the right amount of calories for you.Get information about a proper diet from your caregiver, if necessary.  Regular physical exercise is one of the most important things you can do for your health. Most adults should get at least 150 minutes of moderate-intensity exercise (any activity that increases your heart rate and causes you to sweat) each week. In addition, most adults need muscle-strengthening exercises on 2 or more days a week.  Maintain a healthy weight. The body mass index (BMI) is a screening tool to identify possible weight problems. It provides an estimate of body fat based on height and weight. Your caregiver can help determine your BMI, and can help you achieve or maintain a healthy weight.For adults 20 years and older:  A BMI below 18.5 is considered underweight.  A BMI of 18.5 to 24.9 is normal.  A BMI of 25 to 29.9 is considered overweight.  A BMI of 30 and above is  considered obese.  Maintain normal blood lipids and cholesterol levels by exercising and minimizing your intake of saturated fat. Eat a balanced diet with plenty of fruit and vegetables. Blood tests for lipids and cholesterol should begin at age 20 and be repeated every 5 years. If your lipid or cholesterol levels are high, you are over 50, or you are at high risk for heart disease, you may need your cholesterol levels checked more frequently.Ongoing high lipid and cholesterol levels should be treated with medicines if diet and exercise are not effective.  If you smoke, find out from your caregiver how to quit. If you do not use tobacco, do not start.  If you are pregnant, do not drink alcohol. If you are breastfeeding, be very cautious about drinking alcohol. If you are not pregnant and choose to drink alcohol, do not exceed 1 drink per day. One drink is considered to be 12 ounces (355 mL) of beer, 5 ounces (148 mL) of wine, or 1.5 ounces (44 mL) of liquor.  Avoid use of street drugs. Do not share needles with anyone. Ask for help if you need support or instructions about stopping the use of drugs.  High blood pressure causes heart disease and increases the risk of stroke. Your blood pressure should be checked at least every 1 to 2 years. Ongoing high blood pressure should be treated with medicines if weight loss and exercise are not effective.  If you are 55 to 45 years old, ask your caregiver if you should take aspirin to prevent strokes.  Diabetes   screening involves taking a blood sample to check your fasting blood sugar level. This should be done once every 3 years, after age 45, if you are within normal weight and without risk factors for diabetes. Testing should be considered at a younger age or be carried out more frequently if you are overweight and have at least 1 risk factor for diabetes.  Breast cancer screening is essential preventive care for women. You should practice "breast  self-awareness." This means understanding the normal appearance and feel of your breasts and may include breast self-examination. Any changes detected, no matter how small, should be reported to a caregiver. Women in their 20s and 30s should have a clinical breast exam (CBE) by a caregiver as part of a regular health exam every 1 to 3 years. After age 40, women should have a CBE every year. Starting at age 40, women should consider having a mammography (breast X-ray test) every year. Women who have a family history of breast cancer should talk to their caregiver about genetic screening. Women at a high risk of breast cancer should talk to their caregivers about having magnetic resonance imaging (MRI) and a mammography every year.  The Pap test is a screening test for cervical cancer. A Pap test can show cell changes on the cervix that might become cervical cancer if left untreated. A Pap test is a procedure in which cells are obtained and examined from the lower end of the uterus (cervix).  Women should have a Pap test starting at age 21.  Between ages 21 and 29, Pap tests should be repeated every 2 years.  Beginning at age 30, you should have a Pap test every 3 years as long as the past 3 Pap tests have been normal.  Some women have medical problems that increase the chance of getting cervical cancer. Talk to your caregiver about these problems. It is especially important to talk to your caregiver if a new problem develops soon after your last Pap test. In these cases, your caregiver may recommend more frequent screening and Pap tests.  The above recommendations are the same for women who have or have not gotten the vaccine for human papillomavirus (HPV).  If you had a hysterectomy for a problem that was not cancer or a condition that could lead to cancer, then you no longer need Pap tests. Even if you no longer need a Pap test, a regular exam is a good idea to make sure no other problems are  starting.  If you are between ages 65 and 70, and you have had normal Pap tests going back 10 years, you no longer need Pap tests. Even if you no longer need a Pap test, a regular exam is a good idea to make sure no other problems are starting.  If you have had past treatment for cervical cancer or a condition that could lead to cancer, you need Pap tests and screening for cancer for at least 20 years after your treatment.  If Pap tests have been discontinued, risk factors (such as a new sexual partner) need to be reassessed to determine if screening should be resumed.  The HPV test is an additional test that may be used for cervical cancer screening. The HPV test looks for the virus that can cause the cell changes on the cervix. The cells collected during the Pap test can be tested for HPV. The HPV test could be used to screen women aged 30 years and older, and should   be used in women of any age who have unclear Pap test results. After the age of 30, women should have HPV testing at the same frequency as a Pap test.  Colorectal cancer can be detected and often prevented. Most routine colorectal cancer screening begins at the age of 50 and continues through age 75. However, your caregiver may recommend screening at an earlier age if you have risk factors for colon cancer. On a yearly basis, your caregiver may provide home test kits to check for hidden blood in the stool. Use of a small camera at the end of a tube, to directly examine the colon (sigmoidoscopy or colonoscopy), can detect the earliest forms of colorectal cancer. Talk to your caregiver about this at age 50, when routine screening begins. Direct examination of the colon should be repeated every 5 to 10 years through age 75, unless early forms of pre-cancerous polyps or small growths are found.  Hepatitis C blood testing is recommended for all people born from 1945 through 1965 and any individual with known risks for hepatitis C.  Practice  safe sex. Use condoms and avoid high-risk sexual practices to reduce the spread of sexually transmitted infections (STIs). STIs include gonorrhea, chlamydia, syphilis, trichomonas, herpes, HPV, and human immunodeficiency virus (HIV). Herpes, HIV, and HPV are viral illnesses that have no cure. They can result in disability, cancer, and death. Sexually active women aged 25 and younger should be checked for chlamydia. Older women with new or multiple partners should also be tested for chlamydia. Testing for other STIs is recommended if you are sexually active and at increased risk.  Osteoporosis is a disease in which the bones lose minerals and strength with aging. This can result in serious bone fractures. The risk of osteoporosis can be identified using a bone density scan. Women ages 65 and over and women at risk for fractures or osteoporosis should discuss screening with their caregivers. Ask your caregiver whether you should take a calcium supplement or vitamin D to reduce the rate of osteoporosis.  Menopause can be associated with physical symptoms and risks. Hormone replacement therapy is available to decrease symptoms and risks. You should talk to your caregiver about whether hormone replacement therapy is right for you.  Use sunscreen with sun protection factor (SPF) of 30 or more. Apply sunscreen liberally and repeatedly throughout the day. You should seek shade when your shadow is shorter than you. Protect yourself by wearing long sleeves, pants, a wide-brimmed hat, and sunglasses year round, whenever you are outdoors.  Once a month, do a whole body skin exam, using a mirror to look at the skin on your back. Notify your caregiver of new moles, moles that have irregular borders, moles that are larger than a pencil eraser, or moles that have changed in shape or color.  Stay current with required immunizations.  Influenza. You need a dose every fall (or winter). The composition of the flu vaccine  changes each year, so being vaccinated once is not enough.  Pneumococcal polysaccharide. You need 1 to 2 doses if you smoke cigarettes or if you have certain chronic medical conditions. You need 1 dose at age 65 (or older) if you have never been vaccinated.  Tetanus, diphtheria, pertussis (Tdap, Td). Get 1 dose of Tdap vaccine if you are younger than age 65, are over 65 and have contact with an infant, are a healthcare worker, are pregnant, or simply want to be protected from whooping cough. After that, you need a Td   booster dose every 10 years. Consult your caregiver if you have not had at least 3 tetanus and diphtheria-containing shots sometime in your life or have a deep or dirty wound.  HPV. You need this vaccine if you are a woman age 26 or younger. The vaccine is given in 3 doses over 6 months.  Measles, mumps, rubella (MMR). You need at least 1 dose of MMR if you were born in 1957 or later. You may also need a second dose.  Meningococcal. If you are age 19 to 21 and a first-year college student living in a residence hall, or have one of several medical conditions, you need to get vaccinated against meningococcal disease. You may also need additional booster doses.  Zoster (shingles). If you are age 60 or older, you should get this vaccine.  Varicella (chickenpox). If you have never had chickenpox or you were vaccinated but received only 1 dose, talk to your caregiver to find out if you need this vaccine.  Hepatitis A. You need this vaccine if you have a specific risk factor for hepatitis A virus infection or you simply wish to be protected from this disease. The vaccine is usually given as 2 doses, 6 to 18 months apart.  Hepatitis B. You need this vaccine if you have a specific risk factor for hepatitis B virus infection or you simply wish to be protected from this disease. The vaccine is given in 3 doses, usually over 6 months. Preventive Services / Frequency Ages 19 to 39  Blood  pressure check.** / Every 1 to 2 years.  Lipid and cholesterol check.** / Every 5 years beginning at age 20.  Clinical breast exam.** / Every 3 years for women in their 20s and 30s.  Pap test.** / Every 2 years from ages 21 through 29. Every 3 years starting at age 30 through age 65 or 70 with a history of 3 consecutive normal Pap tests.  HPV screening.** / Every 3 years from ages 30 through ages 65 to 70 with a history of 3 consecutive normal Pap tests.  Hepatitis C blood test.** / For any individual with known risks for hepatitis C.  Skin self-exam. / Monthly.  Influenza immunization.** / Every year.  Pneumococcal polysaccharide immunization.** / 1 to 2 doses if you smoke cigarettes or if you have certain chronic medical conditions.  Tetanus, diphtheria, pertussis (Tdap, Td) immunization. / A one-time dose of Tdap vaccine. After that, you need a Td booster dose every 10 years.  HPV immunization. / 3 doses over 6 months, if you are 26 and younger.  Measles, mumps, rubella (MMR) immunization. / You need at least 1 dose of MMR if you were born in 1957 or later. You may also need a second dose.  Meningococcal immunization. / 1 dose if you are age 19 to 21 and a first-year college student living in a residence hall, or have one of several medical conditions, you need to get vaccinated against meningococcal disease. You may also need additional booster doses.  Varicella immunization.** / Consult your caregiver.  Hepatitis A immunization.** / Consult your caregiver. 2 doses, 6 to 18 months apart.  Hepatitis B immunization.** / Consult your caregiver. 3 doses usually over 6 months. Ages 40 to 64  Blood pressure check.** / Every 1 to 2 years.  Lipid and cholesterol check.** / Every 5 years beginning at age 20.  Clinical breast exam.** / Every year after age 40.  Mammogram.** / Every year beginning at age 40   and continuing for as long as you are in good health. Consult with your  caregiver.  Pap test.** / Every 3 years starting at age 30 through age 65 or 70 with a history of 3 consecutive normal Pap tests.  HPV screening.** / Every 3 years from ages 30 through ages 65 to 70 with a history of 3 consecutive normal Pap tests.  Fecal occult blood test (FOBT) of stool. / Every year beginning at age 50 and continuing until age 75. You may not need to do this test if you get a colonoscopy every 10 years.  Flexible sigmoidoscopy or colonoscopy.** / Every 5 years for a flexible sigmoidoscopy or every 10 years for a colonoscopy beginning at age 50 and continuing until age 75.  Hepatitis C blood test.** / For all people born from 1945 through 1965 and any individual with known risks for hepatitis C.  Skin self-exam. / Monthly.  Influenza immunization.** / Every year.  Pneumococcal polysaccharide immunization.** / 1 to 2 doses if you smoke cigarettes or if you have certain chronic medical conditions.  Tetanus, diphtheria, pertussis (Tdap, Td) immunization.** / A one-time dose of Tdap vaccine. After that, you need a Td booster dose every 10 years.  Measles, mumps, rubella (MMR) immunization. / You need at least 1 dose of MMR if you were born in 1957 or later. You may also need a second dose.  Varicella immunization.** / Consult your caregiver.  Meningococcal immunization.** / Consult your caregiver.  Hepatitis A immunization.** / Consult your caregiver. 2 doses, 6 to 18 months apart.  Hepatitis B immunization.** / Consult your caregiver. 3 doses, usually over 6 months. Ages 65 and over  Blood pressure check.** / Every 1 to 2 years.  Lipid and cholesterol check.** / Every 5 years beginning at age 20.  Clinical breast exam.** / Every year after age 40.  Mammogram.** / Every year beginning at age 40 and continuing for as long as you are in good health. Consult with your caregiver.  Pap test.** / Every 3 years starting at age 30 through age 65 or 70 with a 3  consecutive normal Pap tests. Testing can be stopped between 65 and 70 with 3 consecutive normal Pap tests and no abnormal Pap or HPV tests in the past 10 years.  HPV screening.** / Every 3 years from ages 30 through ages 65 or 70 with a history of 3 consecutive normal Pap tests. Testing can be stopped between 65 and 70 with 3 consecutive normal Pap tests and no abnormal Pap or HPV tests in the past 10 years.  Fecal occult blood test (FOBT) of stool. / Every year beginning at age 50 and continuing until age 75. You may not need to do this test if you get a colonoscopy every 10 years.  Flexible sigmoidoscopy or colonoscopy.** / Every 5 years for a flexible sigmoidoscopy or every 10 years for a colonoscopy beginning at age 50 and continuing until age 75.  Hepatitis C blood test.** / For all people born from 1945 through 1965 and any individual with known risks for hepatitis C.  Osteoporosis screening.** / A one-time screening for women ages 65 and over and women at risk for fractures or osteoporosis.  Skin self-exam. / Monthly.  Influenza immunization.** / Every year.  Pneumococcal polysaccharide immunization.** / 1 dose at age 65 (or older) if you have never been vaccinated.  Tetanus, diphtheria, pertussis (Tdap, Td) immunization. / A one-time dose of Tdap vaccine if you are over   65 and have contact with an infant, are a healthcare worker, or simply want to be protected from whooping cough. After that, you need a Td booster dose every 10 years.  Varicella immunization.** / Consult your caregiver.  Meningococcal immunization.** / Consult your caregiver.  Hepatitis A immunization.** / Consult your caregiver. 2 doses, 6 to 18 months apart.  Hepatitis B immunization.** / Check with your caregiver. 3 doses, usually over 6 months. ** Family history and personal history of risk and conditions may change your caregiver's recommendations. Document Released: 10/07/2001 Document Revised: 11/03/2011  Document Reviewed: 01/06/2011 [employer] Patient Information 2014 [employer].  

## 2013-03-14 NOTE — Assessment & Plan Note (Signed)
Per pulm 

## 2013-03-14 NOTE — Assessment & Plan Note (Signed)
Check labs con't meds 

## 2013-03-14 NOTE — Assessment & Plan Note (Signed)
Refill pristiq con't meds

## 2013-03-14 NOTE — Addendum Note (Signed)
Addended by: Arnette Norris on: 03/14/2013 11:56 AM   Modules accepted: Orders

## 2013-03-15 ENCOUNTER — Other Ambulatory Visit: Payer: Self-pay | Admitting: Family Medicine

## 2013-03-15 ENCOUNTER — Encounter: Payer: Self-pay | Admitting: Family Medicine

## 2013-03-15 DIAGNOSIS — F329 Major depressive disorder, single episode, unspecified: Secondary | ICD-10-CM

## 2013-03-15 DIAGNOSIS — F32A Depression, unspecified: Secondary | ICD-10-CM

## 2013-03-15 MED ORDER — DESVENLAFAXINE SUCCINATE ER 100 MG PO TB24: 100.0000 mg | ORAL_TABLET | Freq: Every day | ORAL | Status: DC

## 2013-03-30 ENCOUNTER — Other Ambulatory Visit (INDEPENDENT_AMBULATORY_CARE_PROVIDER_SITE_OTHER): Payer: BC Managed Care – PPO

## 2013-03-30 DIAGNOSIS — R748 Abnormal levels of other serum enzymes: Secondary | ICD-10-CM

## 2013-03-30 LAB — HEPATIC FUNCTION PANEL
ALT: 54 U/L — ABNORMAL HIGH (ref 0–35)
AST: 43 U/L — ABNORMAL HIGH (ref 0–37)
Albumin: 4.1 g/dL (ref 3.5–5.2)
Alkaline Phosphatase: 48 U/L (ref 39–117)
Bilirubin, Direct: 0 mg/dL (ref 0.0–0.3)
Total Bilirubin: 0.6 mg/dL (ref 0.3–1.2)
Total Protein: 7.1 g/dL (ref 6.0–8.3)

## 2013-03-30 LAB — GAMMA GT: GGT: 18 U/L (ref 7–51)

## 2013-03-31 LAB — HEPATITIS PANEL, ACUTE
HCV Ab: NEGATIVE
Hep A IgM: NEGATIVE
Hep B C IgM: NEGATIVE
Hepatitis B Surface Ag: NEGATIVE

## 2013-04-01 ENCOUNTER — Encounter: Payer: Self-pay | Admitting: Family Medicine

## 2013-04-15 ENCOUNTER — Encounter: Payer: Self-pay | Admitting: Family Medicine

## 2013-04-21 ENCOUNTER — Other Ambulatory Visit: Payer: Self-pay | Admitting: Family Medicine

## 2013-04-22 MED ORDER — FENOFIBRATE 160 MG PO TABS: ORAL_TABLET | ORAL | Status: DC

## 2013-06-09 ENCOUNTER — Other Ambulatory Visit: Payer: Self-pay | Admitting: Nurse Practitioner

## 2013-06-30 ENCOUNTER — Other Ambulatory Visit: Payer: Self-pay

## 2013-08-02 ENCOUNTER — Other Ambulatory Visit: Payer: Self-pay

## 2013-08-02 MED ORDER — FENOFIBRATE 160 MG PO TABS: ORAL_TABLET | ORAL | Status: DC

## 2013-08-03 ENCOUNTER — Encounter: Payer: Self-pay | Admitting: Family Medicine

## 2013-08-04 MED ORDER — FENOFIBRATE 160 MG PO TABS: ORAL_TABLET | ORAL | Status: DC

## 2013-09-01 ENCOUNTER — Ambulatory Visit (INDEPENDENT_AMBULATORY_CARE_PROVIDER_SITE_OTHER): Payer: BC Managed Care – PPO | Admitting: Emergency Medicine

## 2013-09-01 VITALS — BP 126/88 | HR 80 | Temp 98.9°F | Resp 16 | Ht 62.25 in | Wt 183.0 lb

## 2013-09-01 DIAGNOSIS — H6001 Abscess of right external ear: Secondary | ICD-10-CM

## 2013-09-01 DIAGNOSIS — H9209 Otalgia, unspecified ear: Secondary | ICD-10-CM

## 2013-09-01 DIAGNOSIS — H60399 Other infective otitis externa, unspecified ear: Secondary | ICD-10-CM

## 2013-09-01 DIAGNOSIS — H9201 Otalgia, right ear: Secondary | ICD-10-CM

## 2013-09-01 MED ORDER — CIPROFLOXACIN-HYDROCORTISONE 0.2-1 % OT SUSP: 3.0000 [drp] | Freq: Two times a day (BID) | OTIC | Status: DC

## 2013-09-01 MED ORDER — SULFAMETHOXAZOLE-TMP DS 800-160 MG PO TABS: 1.0000 | ORAL_TABLET | Freq: Two times a day (BID) | ORAL | Status: DC

## 2013-09-01 NOTE — Patient Instructions (Signed)
Otitis Externa Otitis externa is a bacterial or fungal infection of the outer ear canal. This is the area from the eardrum to the outside of the ear. Otitis externa is sometimes called "swimmer's ear." CAUSES  Possible causes of infection include:  Swimming in dirty water.  Moisture remaining in the ear after swimming or bathing.  Mild injury (trauma) to the ear.  Objects stuck in the ear (foreign body).  Cuts or scrapes (abrasions) on the outside of the ear. SYMPTOMS  The first symptom of infection is often itching in the ear canal. Later signs and symptoms may include swelling and redness of the ear canal, ear pain, and yellowish-white fluid (pus) coming from the ear. The ear pain may be worse when pulling on the earlobe. DIAGNOSIS  Your caregiver will perform a physical exam. A sample of fluid may be taken from the ear and examined for bacteria or fungi. TREATMENT  Antibiotic ear drops are often given for 10 to 14 days. Treatment may also include pain medicine or corticosteroids to reduce itching and swelling. PREVENTION   Keep your ear dry. Use the corner of a towel to absorb water out of the ear canal after swimming or bathing.  Avoid scratching or putting objects inside your ear. This can damage the ear canal or remove the protective wax that lines the canal. This makes it easier for bacteria and fungi to grow.  Avoid swimming in lakes, polluted water, or poorly chlorinated pools.  You may use ear drops made of rubbing alcohol and vinegar after swimming. Combine equal parts of white vinegar and alcohol in a bottle. Put 3 or 4 drops into each ear after swimming. HOME CARE INSTRUCTIONS   Apply antibiotic ear drops to the ear canal as prescribed by your caregiver.  Only take over-the-counter or prescription medicines for pain, discomfort, or fever as directed by your caregiver.  If you have diabetes, follow any additional treatment instructions from your caregiver.  Keep all  follow-up appointments as directed by your caregiver. SEEK MEDICAL CARE IF:   You have a fever.  Your ear is still red, swollen, painful, or draining pus after 3 days.  Your redness, swelling, or pain gets worse.  You have a severe headache.  You have redness, swelling, pain, or tenderness in the area behind your ear. MAKE SURE YOU:   Understand these instructions.  Will watch your condition.  Will get help right away if you are not doing well or get worse. Document Released: 08/11/2005 Document Revised: 11/03/2011 Document Reviewed: 08/28/2011 [employer] Patient Information 2014 [employer].  

## 2013-09-01 NOTE — Progress Notes (Signed)
[facility] [address], [place] [address] [phone]  Date:  09/01/2013   Name:  Katie [name]   DOB:  May 15, 1966   MRN:  [id number]  PCP:  Garnet Koyanagi, DO    Chief Complaint: Otalgia, Jaw Pain and Facial Pain   History of Present Illness:  Katie [name] is a 48 y.o. very pleasant female patient who presents with the following:  Has one week of pain in right ear. No fever or chills.  No cough or coryza.  Pain increases with chewing or yawning.  No history of injury or antecedent illness.  No improvement with over the counter medications or other home remedies. Denies other complaint or health concern today.   Patient Active Problem List   Diagnosis Date Noted  . Obesity (BMI 30-39.9) 03/14/2013  . RESTLESS LEG SYNDROME 12/13/2009  . OBSTRUCTIVE SLEEP APNEA 12/12/2009  . OTHER ANXIETY STATES 02/01/2009  . HYPERLIPIDEMIA 01/19/2009  . HYPOKALEMIA, MILD 01/19/2009  . DYSPEPSIA 09/28/2008  . INSOMNIA, CHRONIC 07/03/2008  . PLANTAR FASCIITIS, BILATERAL 06/20/2008  . HYPOTHYROIDISM 02/22/2007  . DEVIATED NASAL SEPTUM 02/22/2007  . PREGNANCY, ECTOPIC NEC W/INTRAUTERINE PRG 02/22/2007  . HEADACHE 02/22/2007  . Other Postprocedural Status 02/22/2007    Past Medical History  Diagnosis Date  . Thyroid disease   . RLS (restless legs syndrome)   . Hyperlipidemia   . Allergy   . Anemia   . Anxiety   . Blood transfusion without reported diagnosis     Past Surgical History  Procedure Laterality Date  . Tubal ligation  04/1999  . Carpal tunnel release      Bilateral  . Septoplasty  6/05  . Cryoablation  11/04  . Ectopic pregnancy surgery  1998  . Bunionectomy  06/04/2009    Right foot    History  Substance Use Topics  . Smoking status: Former Smoker -- 1.00 packs/day    Quit date: 01/14/1995  . Smokeless tobacco: Former Systems developer    Quit date: 08/26/1995     Comment: started smoking at age 6 1ppd  . Alcohol Use: 0.5 oz/week    1 drink(s) per  week    Family History  Problem Relation Age of Onset  . Depression Father     Suicide  . Aneurysm Mother     Brain  . Diabetes Maternal Grandmother   . Alzheimer's disease Maternal Grandmother   . Uterine cancer Maternal Grandmother   . Hypertension Maternal Grandmother   . Osteoporosis Maternal Grandmother   . Lung cancer Maternal Grandfather     Allergies  Allergen Reactions  . Morphine     Nausea & vomiting    Medication list has been reviewed and updated.  Current Outpatient Prescriptions on File Prior to Visit  Medication Sig Dispense Refill  . Azelastine-Fluticasone 137-50 MCG/ACT SUSP Place 1 spray into the nose 2 (two) times daily.  23 g  5  . desvenlafaxine (PRISTIQ) 100 MG 24 hr tablet Take 1 tablet (100 mg total) by mouth daily.  30 tablet  5  . fenofibrate 160 MG tablet TAKE ONE TABLET BY MOUTH ONCE DAILY--repeat labs are due now  30 tablet  0  . levothyroxine (SYNTHROID, LEVOTHROID) 125 MCG tablet TAKE ONE TABLET BY MOUTH EVERY DAY  90 tablet  3  . QUEtiapine (SEROQUEL) 50 MG tablet TAKE 1 TABLET BY MOUTH EVERY EVENING  90 tablet  1  . celecoxib (CELEBREX) 200 MG capsule Take 1 capsule (200 mg total)  by mouth daily as needed for pain.  30 capsule  5  . cetirizine (ZYRTEC) 10 MG tablet Take 10 mg by mouth daily.      . Pitavastatin Calcium (LIVALO) 2 MG TABS TAKE ONE TABLET BY MOUTH EVERY DAY AT BEDTIME--repeat labs are due now  30 tablet  6   No current facility-administered medications on file prior to visit.    Review of Systems:  As per HPI, otherwise negative.    Physical Examination: Filed Vitals:   09/01/13 1623  BP: 126/88  Pulse: 80  Temp: 98.9 F (37.2 C)  Resp: 16   Filed Vitals:   09/01/13 1623  Height: 5' 2.25" (1.581 m)  Weight: 183 lb (83.008 kg)   Body mass index is 33.21 kg/(m^2). Ideal Body Weight: Weight in (lb) to have BMI = 25: 137.5   GEN: WDWN, NAD, Non-toxic, Alert & Oriented x 3 HEENT: Atraumatic, Normocephalic.   Ears and Nose: No external deformity. Pustule just inside external canal. EXTR: No clubbing/cyanosis/edema NEURO: Normal gait.  PSYCH: Normally interactive. Conversant. Not depressed or anxious appearing.  Calm demeanor.    Assessment and Plan: augmentin Pustule in external canal   Signed,  Ellison Carwin, MD

## 2013-09-15 ENCOUNTER — Encounter: Payer: Self-pay | Admitting: Family Medicine

## 2013-09-15 ENCOUNTER — Other Ambulatory Visit (INDEPENDENT_AMBULATORY_CARE_PROVIDER_SITE_OTHER): Payer: BC Managed Care – PPO

## 2013-09-15 DIAGNOSIS — R7401 Elevation of levels of liver transaminase levels: Secondary | ICD-10-CM

## 2013-09-15 DIAGNOSIS — R740 Nonspecific elevation of levels of transaminase and lactic acid dehydrogenase [LDH]: Principal | ICD-10-CM

## 2013-09-15 DIAGNOSIS — R7402 Elevation of levels of lactic acid dehydrogenase (LDH): Secondary | ICD-10-CM

## 2013-09-15 LAB — HEPATIC FUNCTION PANEL
ALT: 29 U/L (ref 0–35)
AST: 23 U/L (ref 0–37)
Albumin: 4.3 g/dL (ref 3.5–5.2)
Alkaline Phosphatase: 54 U/L (ref 39–117)
Bilirubin, Direct: 0 mg/dL (ref 0.0–0.3)
Total Bilirubin: 0.6 mg/dL (ref 0.3–1.2)
Total Protein: 7.8 g/dL (ref 6.0–8.3)

## 2013-09-15 MED ORDER — FENOFIBRATE 160 MG PO TABS: ORAL_TABLET | ORAL | Status: DC

## 2013-10-06 ENCOUNTER — Encounter: Payer: Self-pay | Admitting: Family Medicine

## 2013-10-06 DIAGNOSIS — F329 Major depressive disorder, single episode, unspecified: Secondary | ICD-10-CM

## 2013-10-06 DIAGNOSIS — F32A Depression, unspecified: Secondary | ICD-10-CM

## 2013-10-07 MED ORDER — DESVENLAFAXINE SUCCINATE ER 100 MG PO TB24: 100.0000 mg | ORAL_TABLET | Freq: Every day | ORAL | Status: DC

## 2013-10-17 ENCOUNTER — Encounter: Payer: Self-pay | Admitting: Family Medicine

## 2013-10-17 MED ORDER — FENOFIBRATE 160 MG PO TABS: ORAL_TABLET | ORAL | Status: DC

## 2013-11-07 ENCOUNTER — Encounter: Payer: Self-pay | Admitting: Family Medicine

## 2013-11-07 ENCOUNTER — Ambulatory Visit (INDEPENDENT_AMBULATORY_CARE_PROVIDER_SITE_OTHER): Payer: BC Managed Care – PPO | Admitting: Family Medicine

## 2013-11-07 VITALS — BP 110/74 | HR 76 | Temp 98.3°F | Wt 184.2 lb

## 2013-11-07 DIAGNOSIS — F3289 Other specified depressive episodes: Secondary | ICD-10-CM

## 2013-11-07 DIAGNOSIS — F329 Major depressive disorder, single episode, unspecified: Secondary | ICD-10-CM

## 2013-11-07 DIAGNOSIS — F32A Depression, unspecified: Secondary | ICD-10-CM

## 2013-11-07 DIAGNOSIS — Z1231 Encounter for screening mammogram for malignant neoplasm of breast: Secondary | ICD-10-CM

## 2013-11-07 DIAGNOSIS — E039 Hypothyroidism, unspecified: Secondary | ICD-10-CM

## 2013-11-07 DIAGNOSIS — E785 Hyperlipidemia, unspecified: Secondary | ICD-10-CM

## 2013-11-07 MED ORDER — DESVENLAFAXINE SUCCINATE ER 100 MG PO TB24: 100.0000 mg | ORAL_TABLET | Freq: Every day | ORAL | Status: DC

## 2013-11-07 NOTE — Progress Notes (Signed)
Pre visit review using our clinic review tool, if applicable. No additional management support is needed unless otherwise documented below in the visit note. 

## 2013-11-07 NOTE — Patient Instructions (Signed)

## 2013-11-07 NOTE — Progress Notes (Signed)
Patient ID: Katie [name], female   DOB: 09-28-1965, 48 y.o.   MRN: [id number]   Subjective:    Patient ID: Katie [name], female    DOB: August 09, 1966, 48 y.o.   MRN: [id number] HPI Pt here f/u depression and cholesterol.  Pt is doing well.  No complaints.  She is asking if we can take over writing for seroquel for sleep that neuro gave her.  She does not need it now.            Objective:    BP 110/74  Pulse 76  Temp(Src) 98.3 F (36.8 C) (Oral)  Wt 184 lb 3.2 oz (83.553 kg)  SpO2 98% General appearance: alert, cooperative, appears stated age and no distress Ears: normal TM's and external ear canals both ears Nose: Nares normal. Septum midline. Mucosa normal. No drainage or sinus tenderness. Throat: lips, mucosa, and tongue normal; teeth and gums normal Neck: no adenopathy, no carotid bruit, no JVD, supple, symmetrical, trachea midline and thyroid not enlarged, symmetric, no tenderness/mass/nodules Lungs: clear to auscultation bilaterally Heart: regular rate and rhythm, S1, S2 normal, no murmur, click, rub or gallop Extremities: extremities normal, atraumatic, no cyanosis or edema       Assessment & Plan:  1. Other screening mammogram  - MM Digital Screening; Future  2. Depression stable - desvenlafaxine (PRISTIQ) 100 MG 24 hr tablet; Take 1 tablet (100 mg total) by mouth daily.  Dispense: 90 tablet; Refill: 3  3. Other and unspecified hyperlipidemia Check labs and con't meds - Basic metabolic panel; Future - CBC with Differential; Future - Hepatic function panel; Future - Lipid panel; Future - TSH; Future - POCT urinalysis dipstick; Future  4. Unspecified hypothyroidism Check labs - Basic metabolic panel; Future - CBC with Differential; Future - Hepatic function panel; Future - Lipid panel; Future - TSH; Future - POCT urinalysis dipstick; Future

## 2013-12-02 ENCOUNTER — Encounter: Payer: Self-pay | Admitting: Family Medicine

## 2013-12-05 ENCOUNTER — Encounter: Payer: Self-pay | Admitting: Family Medicine

## 2013-12-06 MED ORDER — QUETIAPINE FUMARATE 50 MG PO TABS: ORAL_TABLET | ORAL | Status: DC

## 2013-12-06 NOTE — Telephone Encounter (Signed)
Please advise if refill appropriate.      KP 

## 2014-01-05 ENCOUNTER — Emergency Department (INDEPENDENT_AMBULATORY_CARE_PROVIDER_SITE_OTHER)
Admission: EM | Admit: 2014-01-05 | Discharge: 2014-01-05 | Disposition: A | Discharge status: Home / Self Care | Payer: BC Managed Care – PPO | Source: Home / Self Care | Attending: Emergency Medicine | Admitting: Emergency Medicine

## 2014-01-05 ENCOUNTER — Encounter: Payer: Self-pay | Admitting: Emergency Medicine

## 2014-01-05 DIAGNOSIS — R300 Dysuria: Secondary | ICD-10-CM

## 2014-01-05 LAB — POCT URINALYSIS DIP (MANUAL ENTRY)
Bilirubin, UA: NEGATIVE
Glucose, UA: NEGATIVE
Ketones, POC UA: NEGATIVE
Leukocytes, UA: NEGATIVE
Nitrite, UA: NEGATIVE
Protein Ur, POC: NEGATIVE
Spec Grav, UA: 1.025 (ref 1.005–1.03)
Urobilinogen, UA: 0.2 (ref 0–1)
pH, UA: 5.5 (ref 5–8)

## 2014-01-05 MED ORDER — CIPROFLOXACIN HCL 250 MG PO TABS: 250.0000 mg | ORAL_TABLET | Freq: Two times a day (BID) | ORAL | Status: DC

## 2014-01-05 NOTE — ED Notes (Signed)
Bladder pain x 2 days, foul smelling urine

## 2014-01-05 NOTE — ED Provider Notes (Signed)
CSN: [id number]     Arrival date & time 01/05/14  1641 History   First MD Initiated Contact with Patient 01/05/14 1642     Chief Complaint  Patient presents with  . Pelvic Pain   (Consider location/radiation/quality/duration/timing/severity/associated sxs/prior Treatment) HPI Katie [name] is a 48 y.o. female who presents today with UTI symptoms for 2 days.  Feels like UTI's she's had before (usually doesn't get dysuria). No dysuria + frequency + odor + urgency No hematuria No vaginal discharge No fever/chills No lower abdominal pain + R back pain + fatigue    Past Medical History  Diagnosis Date  . Thyroid disease   . RLS (restless legs syndrome)   . Hyperlipidemia   . Allergy   . Anemia   . Anxiety   . Blood transfusion without reported diagnosis    Past Surgical History  Procedure Laterality Date  . Tubal ligation  04/1999  . Carpal tunnel release      Bilateral  . Septoplasty  6/05  . Cryoablation  11/04  . Ectopic pregnancy surgery  1998  . Bunionectomy  06/04/2009    Right foot   Family History  Problem Relation Age of Onset  . Depression Father     Suicide  . Aneurysm Mother     Brain  . Diabetes Maternal Grandmother   . Alzheimer's disease Maternal Grandmother   . Uterine cancer Maternal Grandmother   . Hypertension Maternal Grandmother   . Osteoporosis Maternal Grandmother   . Lung cancer Maternal Grandfather    History  Substance Use Topics  . Smoking status: Former Smoker -- 1.00 packs/day    Quit date: 01/14/1995  . Smokeless tobacco: Former Systems developer    Quit date: 08/26/1995     Comment: started smoking at age 89 1ppd  . Alcohol Use: 0.5 oz/week    1 drink(s) per week   OB History   Grav Para Term Preterm Abortions TAB SAB Ect Mult Living                 Review of Systems  All other systems reviewed and are negative.   Allergies  Morphine  Home Medications   Prior to Admission medications   Medication Sig Start Date End Date Taking?  Authorizing Provider  Azelastine-Fluticasone 137-50 MCG/ACT SUSP Place 1 spray into the nose 2 (two) times daily. 03/10/13   Rosalita Chessman, DO  cetirizine (ZYRTEC) 10 MG tablet Take 10 mg by mouth daily.    Historical Provider, MD  desvenlafaxine (PRISTIQ) 100 MG 24 hr tablet Take 1 tablet (100 mg total) by mouth daily. 11/07/13   Rosalita Chessman, DO  fenofibrate 160 MG tablet TAKE ONE TABLET BY MOUTH ONCE DAILY 10/17/13   Rosalita Chessman, DO  levothyroxine (SYNTHROID, LEVOTHROID) 125 MCG tablet TAKE ONE TABLET BY MOUTH EVERY DAY 01/24/13   Rosalita Chessman, DO  QUEtiapine (SEROQUEL) 50 MG tablet TAKE 1 TABLET BY MOUTH EVERY EVENING 12/06/13   Yvonne R Lowne, DO   BP 136/83  Pulse 73  Temp(Src) 98.1 F (36.7 C) (Oral)  Ht 5\' 4"  (1.626 m)  Wt 185 lb (83.915 kg)  BMI 31.74 kg/m2  SpO2 98% Physical Exam  Nursing note and vitals reviewed. Constitutional: She is oriented to person, place, and time. She appears well-developed and well-nourished.  HENT:  Head: Normocephalic and atraumatic.  Eyes: No scleral icterus.  Neck: Neck supple.  Cardiovascular: Regular rhythm and normal heart sounds.   Pulmonary/Chest: Effort normal and breath sounds normal.  No respiratory distress.  Abdominal: Soft. Normal appearance and bowel sounds are normal. She exhibits no mass. There is CVA tenderness (Mild R sided CVA tenderness). There is no rebound and no guarding.  Neurological: She is alert and oriented to person, place, and time.  Skin: Skin is warm and dry.  Psychiatric: She has a normal mood and affect. Her speech is normal.    ED Course  Procedures (including critical care time) Labs Review Labs Reviewed  URINE CULTURE  POCT URINALYSIS DIP (MANUAL ENTRY)    Imaging Review No results found.   MDM   1. Dysuria    1) Take the prescribed antibiotic as directed.  Possible UTI, culture pending.  No si/sx for vaginal etiology. 2) A urinalysis was done in clinic.  A urine culture is pending. 3) Follow  up with your PCP or urologist if not improving or if worsening symptoms.    Janeann Forehand, MD 01/05/14 [phone]

## 2014-01-07 ENCOUNTER — Telehealth: Payer: Self-pay

## 2014-01-07 LAB — URINE CULTURE: Colony Count: 5000

## 2014-01-07 NOTE — ED Notes (Signed)
I called and spoke with patient and she is doing better. I advised to call back if anything changes or if she has questions or concerns. I did advise patient of urine culture results. Also to seek medical attention if symptoms persist or worsen.

## 2014-02-04 ENCOUNTER — Encounter: Payer: Self-pay | Admitting: Family Medicine

## 2014-02-06 MED ORDER — LEVOTHYROXINE SODIUM 125 MCG PO TABS: ORAL_TABLET | ORAL | Status: DC

## 2014-02-06 MED ORDER — FENOFIBRATE 160 MG PO TABS: ORAL_TABLET | ORAL | Status: DC

## 2014-03-07 ENCOUNTER — Encounter: Payer: Self-pay | Admitting: Family Medicine

## 2014-03-07 MED ORDER — FENOFIBRATE 160 MG PO TABS: ORAL_TABLET | ORAL | Status: DC

## 2014-03-31 ENCOUNTER — Encounter: Payer: Self-pay | Admitting: Family Medicine

## 2014-03-31 ENCOUNTER — Ambulatory Visit (INDEPENDENT_AMBULATORY_CARE_PROVIDER_SITE_OTHER): Payer: BC Managed Care – PPO | Admitting: Family Medicine

## 2014-03-31 VITALS — BP 110/66 | HR 80 | Temp 98.0°F | Ht 63.0 in | Wt 186.0 lb

## 2014-03-31 DIAGNOSIS — G4700 Insomnia, unspecified: Secondary | ICD-10-CM

## 2014-03-31 DIAGNOSIS — Z0000 Encounter for general adult medical examination without abnormal findings: Secondary | ICD-10-CM

## 2014-03-31 MED ORDER — FENOFIBRATE 160 MG PO TABS: ORAL_TABLET | ORAL | Status: DC

## 2014-03-31 MED ORDER — SUVOREXANT 10 MG PO TABS: 1.0000 | ORAL_TABLET | Freq: Every evening | ORAL | Status: DC | PRN

## 2014-03-31 MED ORDER — LEVOTHYROXINE SODIUM 125 MCG PO TABS: ORAL_TABLET | ORAL | Status: DC

## 2014-03-31 NOTE — Progress Notes (Signed)
Pre visit review using our clinic review tool, if applicable. No additional management support is needed unless otherwise documented below in the visit note. 

## 2014-03-31 NOTE — Patient Instructions (Signed)
Preventive Care for Adults A healthy lifestyle and preventive care can promote health and wellness. Preventive health guidelines for women include the following key practices.  A routine yearly physical is a good way to check with your health care provider about your health and preventive screening. It is a chance to share any concerns and updates on your health and to receive a thorough exam.  Visit your dentist for a routine exam and preventive care every 6 months. Brush your teeth twice a day and floss once a day. Good oral hygiene prevents tooth decay and gum disease.  The frequency of eye exams is based on your age, health, family medical history, use of contact lenses, and other factors. Follow your health care provider's recommendations for frequency of eye exams.  Eat a healthy diet. Foods like vegetables, fruits, whole grains, low-fat dairy products, and lean protein foods contain the nutrients you need without too many calories. Decrease your intake of foods high in solid fats, added sugars, and salt. Eat the right amount of calories for you.Get information about a proper diet from your health care provider, if necessary.  Regular physical exercise is one of the most important things you can do for your health. Most adults should get at least 150 minutes of moderate-intensity exercise (any activity that increases your heart rate and causes you to sweat) each week. In addition, most adults need muscle-strengthening exercises on 2 or more days a week.  Maintain a healthy weight. The body mass index (BMI) is a screening tool to identify possible weight problems. It provides an estimate of body fat based on height and weight. Your health care provider can find your BMI and can help you achieve or maintain a healthy weight.For adults 20 years and older:  A BMI below 18.5 is considered underweight.  A BMI of 18.5 to 24.9 is normal.  A BMI of 25 to 29.9 is considered overweight.  A BMI of  30 and above is considered obese.  Maintain normal blood lipids and cholesterol levels by exercising and minimizing your intake of saturated fat. Eat a balanced diet with plenty of fruit and vegetables. Blood tests for lipids and cholesterol should begin at age 76 and be repeated every 5 years. If your lipid or cholesterol levels are high, you are over 50, or you are at high risk for heart disease, you may need your cholesterol levels checked more frequently.Ongoing high lipid and cholesterol levels should be treated with medicines if diet and exercise are not working.  If you smoke, find out from your health care provider how to quit. If you do not use tobacco, do not start.  Lung cancer screening is recommended for adults aged 22-80 years who are at high risk for developing lung cancer because of a history of smoking. A yearly low-dose CT scan of the lungs is recommended for people who have at least a 30-pack-year history of smoking and are a current smoker or have quit within the past 15 years. A pack year of smoking is smoking an average of 1 pack of cigarettes a day for 1 year (for example: 1 pack a day for 30 years or 2 packs a day for 15 years). Yearly screening should continue until the smoker has stopped smoking for at least 15 years. Yearly screening should be stopped for people who develop a health problem that would prevent them from having lung cancer treatment.  If you are pregnant, do not drink alcohol. If you are breastfeeding,  be very cautious about drinking alcohol. If you are not pregnant and choose to drink alcohol, do not have more than 1 drink per day. One drink is considered to be 12 ounces (355 mL) of beer, 5 ounces (148 mL) of wine, or 1.5 ounces (44 mL) of liquor.  Avoid use of street drugs. Do not share needles with anyone. Ask for help if you need support or instructions about stopping the use of drugs.  High blood pressure causes heart disease and increases the risk of  stroke. Your blood pressure should be checked at least every 1 to 2 years. Ongoing high blood pressure should be treated with medicines if weight loss and exercise do not work.  If you are 3-86 years old, ask your health care provider if you should take aspirin to prevent strokes.  Diabetes screening involves taking a blood sample to check your fasting blood sugar level. This should be done once every 3 years, after age 67, if you are within normal weight and without risk factors for diabetes. Testing should be considered at a younger age or be carried out more frequently if you are overweight and have at least 1 risk factor for diabetes.  Breast cancer screening is essential preventive care for women. You should practice "breast self-awareness." This means understanding the normal appearance and feel of your breasts and may include breast self-examination. Any changes detected, no matter how small, should be reported to a health care provider. Women in their 8s and 30s should have a clinical breast exam (CBE) by a health care provider as part of a regular health exam every 1 to 3 years. After age 70, women should have a CBE every year. Starting at age 25, women should consider having a mammogram (breast X-ray test) every year. Women who have a family history of breast cancer should talk to their health care provider about genetic screening. Women at a high risk of breast cancer should talk to their health care providers about having an MRI and a mammogram every year.  Breast cancer gene (BRCA)-related cancer risk assessment is recommended for women who have family members with BRCA-related cancers. BRCA-related cancers include breast, ovarian, tubal, and peritoneal cancers. Having family members with these cancers may be associated with an increased risk for harmful changes (mutations) in the breast cancer genes BRCA1 and BRCA2. Results of the assessment will determine the need for genetic counseling and  BRCA1 and BRCA2 testing.  Routine pelvic exams to screen for cancer are no longer recommended for nonpregnant women who are considered low risk for cancer of the pelvic organs (ovaries, uterus, and vagina) and who do not have symptoms. Ask your health care provider if a screening pelvic exam is right for you.  If you have had past treatment for cervical cancer or a condition that could lead to cancer, you need Pap tests and screening for cancer for at least 20 years after your treatment. If Pap tests have been discontinued, your risk factors (such as having a new sexual partner) need to be reassessed to determine if screening should be resumed. Some women have medical problems that increase the chance of getting cervical cancer. In these cases, your health care provider may recommend more frequent screening and Pap tests.  The HPV test is an additional test that may be used for cervical cancer screening. The HPV test looks for the virus that can cause the cell changes on the cervix. The cells collected during the Pap test can be  tested for HPV. The HPV test could be used to screen women aged 30 years and older, and should be used in women of any age who have unclear Pap test results. After the age of 30, women should have HPV testing at the same frequency as a Pap test.  Colorectal cancer can be detected and often prevented. Most routine colorectal cancer screening begins at the age of 50 years and continues through age 75 years. However, your health care provider may recommend screening at an earlier age if you have risk factors for colon cancer. On a yearly basis, your health care provider may provide home test kits to check for hidden blood in the stool. Use of a small camera at the end of a tube, to directly examine the colon (sigmoidoscopy or colonoscopy), can detect the earliest forms of colorectal cancer. Talk to your health care provider about this at age 50, when routine screening begins. Direct  exam of the colon should be repeated every 5-10 years through age 75 years, unless early forms of pre-cancerous polyps or small growths are found.  People who are at an increased risk for hepatitis B should be screened for this virus. You are considered at high risk for hepatitis B if:  You were born in a country where hepatitis B occurs often. Talk with your health care provider about which countries are considered high risk.  Your parents were born in a high-risk country and you have not received a shot to protect against hepatitis B (hepatitis B vaccine).  You have HIV or AIDS.  You use needles to inject street drugs.  You live with, or have sex with, someone who has hepatitis B.  You get hemodialysis treatment.  You take certain medicines for conditions like cancer, organ transplantation, and autoimmune conditions.  Hepatitis C blood testing is recommended for all people born from 1945 through 1965 and any individual with known risks for hepatitis C.  Practice safe sex. Use condoms and avoid high-risk sexual practices to reduce the spread of sexually transmitted infections (STIs). STIs include gonorrhea, chlamydia, syphilis, trichomonas, herpes, HPV, and human immunodeficiency virus (HIV). Herpes, HIV, and HPV are viral illnesses that have no cure. They can result in disability, cancer, and death.  You should be screened for sexually transmitted illnesses (STIs) including gonorrhea and chlamydia if:  You are sexually active and are younger than 24 years.  You are older than 24 years and your health care provider tells you that you are at risk for this type of infection.  Your sexual activity has changed since you were last screened and you are at an increased risk for chlamydia or gonorrhea. Ask your health care provider if you are at risk.  If you are at risk of being infected with HIV, it is recommended that you take a prescription medicine daily to prevent HIV infection. This is  called preexposure prophylaxis (PrEP). You are considered at risk if:  You are a heterosexual woman, are sexually active, and are at increased risk for HIV infection.  You take drugs by injection.  You are sexually active with a partner who has HIV.  Talk with your health care provider about whether you are at high risk of being infected with HIV. If you choose to begin PrEP, you should first be tested for HIV. You should then be tested every 3 months for as long as you are taking PrEP.  Osteoporosis is a disease in which the bones lose minerals and strength   with aging. This can result in serious bone fractures or breaks. The risk of osteoporosis can be identified using a bone density scan. Women ages 65 years and over and women at risk for fractures or osteoporosis should discuss screening with their health care providers. Ask your health care provider whether you should take a calcium supplement or vitamin D to reduce the rate of osteoporosis.  Menopause can be associated with physical symptoms and risks. Hormone replacement therapy is available to decrease symptoms and risks. You should talk to your health care provider about whether hormone replacement therapy is right for you.  Use sunscreen. Apply sunscreen liberally and repeatedly throughout the day. You should seek shade when your shadow is shorter than you. Protect yourself by wearing long sleeves, pants, a wide-brimmed hat, and sunglasses year round, whenever you are outdoors.  Once a month, do a whole body skin exam, using a mirror to look at the skin on your back. Tell your health care provider of new moles, moles that have irregular borders, moles that are larger than a pencil eraser, or moles that have changed in shape or color.  Stay current with required vaccines (immunizations).  Influenza vaccine. All adults should be immunized every year.  Tetanus, diphtheria, and acellular pertussis (Td, Tdap) vaccine. Pregnant women should  receive 1 dose of Tdap vaccine during each pregnancy. The dose should be obtained regardless of the length of time since the last dose. Immunization is preferred during the 27th-36th week of gestation. An adult who has not previously received Tdap or who does not know her vaccine status should receive 1 dose of Tdap. This initial dose should be followed by tetanus and diphtheria toxoids (Td) booster doses every 10 years. Adults with an unknown or incomplete history of completing a 3-dose immunization series with Td-containing vaccines should begin or complete a primary immunization series including a Tdap dose. Adults should receive a Td booster every 10 years.  Varicella vaccine. An adult without evidence of immunity to varicella should receive 2 doses or a second dose if she has previously received 1 dose. Pregnant females who do not have evidence of immunity should receive the first dose after pregnancy. This first dose should be obtained before leaving the health care facility. The second dose should be obtained 4-8 weeks after the first dose.  Human papillomavirus (HPV) vaccine. Females aged 13-26 years who have not received the vaccine previously should obtain the 3-dose series. The vaccine is not recommended for use in pregnant females. However, pregnancy testing is not needed before receiving a dose. If a female is found to be pregnant after receiving a dose, no treatment is needed. In that case, the remaining doses should be delayed until after the pregnancy. Immunization is recommended for any person with an immunocompromised condition through the age of 26 years if she did not get any or all doses earlier. During the 3-dose series, the second dose should be obtained 4-8 weeks after the first dose. The third dose should be obtained 24 weeks after the first dose and 16 weeks after the second dose.  Zoster vaccine. One dose is recommended for adults aged 60 years or older unless certain conditions are  present.  Measles, mumps, and rubella (MMR) vaccine. Adults born before 1957 generally are considered immune to measles and mumps. Adults born in 1957 or later should have 1 or more doses of MMR vaccine unless there is a contraindication to the vaccine or there is laboratory evidence of immunity to   each of the three diseases. A routine second dose of MMR vaccine should be obtained at least 28 days after the first dose for students attending postsecondary schools, health care workers, or international travelers. People who received inactivated measles vaccine or an unknown type of measles vaccine during 1963-1967 should receive 2 doses of MMR vaccine. People who received inactivated mumps vaccine or an unknown type of mumps vaccine before 1979 and are at high risk for mumps infection should consider immunization with 2 doses of MMR vaccine. For females of childbearing age, rubella immunity should be determined. If there is no evidence of immunity, females who are not pregnant should be vaccinated. If there is no evidence of immunity, females who are pregnant should delay immunization until after pregnancy. Unvaccinated health care workers born before 1957 who lack laboratory evidence of measles, mumps, or rubella immunity or laboratory confirmation of disease should consider measles and mumps immunization with 2 doses of MMR vaccine or rubella immunization with 1 dose of MMR vaccine.  Pneumococcal 13-valent conjugate (PCV13) vaccine. When indicated, a person who is uncertain of her immunization history and has no record of immunization should receive the PCV13 vaccine. An adult aged 19 years or older who has certain medical conditions and has not been previously immunized should receive 1 dose of PCV13 vaccine. This PCV13 should be followed with a dose of pneumococcal polysaccharide (PPSV23) vaccine. The PPSV23 vaccine dose should be obtained at least 8 weeks after the dose of PCV13 vaccine. An adult aged 19  years or older who has certain medical conditions and previously received 1 or more doses of PPSV23 vaccine should receive 1 dose of PCV13. The PCV13 vaccine dose should be obtained 1 or more years after the last PPSV23 vaccine dose.  Pneumococcal polysaccharide (PPSV23) vaccine. When PCV13 is also indicated, PCV13 should be obtained first. All adults aged 65 years and older should be immunized. An adult younger than age 65 years who has certain medical conditions should be immunized. Any person who resides in a nursing home or long-term care facility should be immunized. An adult smoker should be immunized. People with an immunocompromised condition and certain other conditions should receive both PCV13 and PPSV23 vaccines. People with human immunodeficiency virus (HIV) infection should be immunized as soon as possible after diagnosis. Immunization during chemotherapy or radiation therapy should be avoided. Routine use of PPSV23 vaccine is not recommended for American Indians, [place] Natives, or people younger than 65 years unless there are medical conditions that require PPSV23 vaccine. When indicated, people who have unknown immunization and have no record of immunization should receive PPSV23 vaccine. One-time revaccination 5 years after the first dose of PPSV23 is recommended for people aged 19-64 years who have chronic kidney failure, nephrotic syndrome, asplenia, or immunocompromised conditions. People who received 1-2 doses of PPSV23 before age 65 years should receive another dose of PPSV23 vaccine at age 65 years or later if at least 5 years have passed since the previous dose. Doses of PPSV23 are not needed for people immunized with PPSV23 at or after age 65 years.  Meningococcal vaccine. Adults with asplenia or persistent complement component deficiencies should receive 2 doses of quadrivalent meningococcal conjugate (MenACWY-D) vaccine. The doses should be obtained at least 2 months apart.  Microbiologists working with certain meningococcal bacteria, military recruits, people at risk during an outbreak, and people who travel to or live in countries with a high rate of meningitis should be immunized. A first-year college student up through age   21 years who is living in a residence hall should receive a dose if she did not receive a dose on or after her 16th birthday. Adults who have certain high-risk conditions should receive one or more doses of vaccine.  Hepatitis A vaccine. Adults who wish to be protected from this disease, have certain high-risk conditions, work with hepatitis A-infected animals, work in hepatitis A research labs, or travel to or work in countries with a high rate of hepatitis A should be immunized. Adults who were previously unvaccinated and who anticipate close contact with an international adoptee during the first 60 days after arrival in the [place] from a country with a high rate of hepatitis A should be immunized.  Hepatitis B vaccine. Adults who wish to be protected from this disease, have certain high-risk conditions, may be exposed to blood or other infectious body fluids, are household contacts or sex partners of hepatitis B positive people, are clients or workers in certain care facilities, or travel to or work in countries with a high rate of hepatitis B should be immunized.  Haemophilus influenzae type b (Hib) vaccine. A previously unvaccinated person with asplenia or sickle cell disease or having a scheduled splenectomy should receive 1 dose of Hib vaccine. Regardless of previous immunization, a recipient of a hematopoietic stem cell transplant should receive a 3-dose series 6-12 months after her successful transplant. Hib vaccine is not recommended for adults with HIV infection. Preventive Services / Frequency Ages 64 to 68 years  Blood pressure check.** / Every 1 to 2 years.  Lipid and cholesterol check.** / Every 5 years beginning at age  22.  Clinical breast exam.** / Every 3 years for women in their 88s and 53s.  BRCA-related cancer risk assessment.** / For women who have family members with a BRCA-related cancer (breast, ovarian, tubal, or peritoneal cancers).  Pap test.** / Every 2 years from ages 90 through 51. Every 3 years starting at age 21 through age 56 or 3 with a history of 3 consecutive normal Pap tests.  HPV screening.** / Every 3 years from ages 24 through ages 1 to 46 with a history of 3 consecutive normal Pap tests.  Hepatitis C blood test.** / For any individual with known risks for hepatitis C.  Skin self-exam. / Monthly.  Influenza vaccine. / Every year.  Tetanus, diphtheria, and acellular pertussis (Tdap, Td) vaccine.** / Consult your health care provider. Pregnant women should receive 1 dose of Tdap vaccine during each pregnancy. 1 dose of Td every 10 years.  Varicella vaccine.** / Consult your health care provider. Pregnant females who do not have evidence of immunity should receive the first dose after pregnancy.  HPV vaccine. / 3 doses over 6 months, if 72 and younger. The vaccine is not recommended for use in pregnant females. However, pregnancy testing is not needed before receiving a dose.  Measles, mumps, rubella (MMR) vaccine.** / You need at least 1 dose of MMR if you were born in 1957 or later. You may also need a 2nd dose. For females of childbearing age, rubella immunity should be determined. If there is no evidence of immunity, females who are not pregnant should be vaccinated. If there is no evidence of immunity, females who are pregnant should delay immunization until after pregnancy.  Pneumococcal 13-valent conjugate (PCV13) vaccine.** / Consult your health care provider.  Pneumococcal polysaccharide (PPSV23) vaccine.** / 1 to 2 doses if you smoke cigarettes or if you have certain conditions.  Meningococcal vaccine.** /  1 dose if you are age 19 to 21 years and a first-year college  student living in a residence hall, or have one of several medical conditions, you need to get vaccinated against meningococcal disease. You may also need additional booster doses.  Hepatitis A vaccine.** / Consult your health care provider.  Hepatitis B vaccine.** / Consult your health care provider.  Haemophilus influenzae type b (Hib) vaccine.** / Consult your health care provider. Ages 40 to 64 years  Blood pressure check.** / Every 1 to 2 years.  Lipid and cholesterol check.** / Every 5 years beginning at age 20 years.  Lung cancer screening. / Every year if you are aged 55-80 years and have a 30-pack-year history of smoking and currently smoke or have quit within the past 15 years. Yearly screening is stopped once you have quit smoking for at least 15 years or develop a health problem that would prevent you from having lung cancer treatment.  Clinical breast exam.** / Every year after age 40 years.  BRCA-related cancer risk assessment.** / For women who have family members with a BRCA-related cancer (breast, ovarian, tubal, or peritoneal cancers).  Mammogram.** / Every year beginning at age 40 years and continuing for as long as you are in good health. Consult with your health care provider.  Pap test.** / Every 3 years starting at age 30 years through age 65 or 70 years with a history of 3 consecutive normal Pap tests.  HPV screening.** / Every 3 years from ages 30 years through ages 65 to 70 years with a history of 3 consecutive normal Pap tests.  Fecal occult blood test (FOBT) of stool. / Every year beginning at age 50 years and continuing until age 75 years. You may not need to do this test if you get a colonoscopy every 10 years.  Flexible sigmoidoscopy or colonoscopy.** / Every 5 years for a flexible sigmoidoscopy or every 10 years for a colonoscopy beginning at age 50 years and continuing until age 75 years.  Hepatitis C blood test.** / For all people born from 1945 through  1965 and any individual with known risks for hepatitis C.  Skin self-exam. / Monthly.  Influenza vaccine. / Every year.  Tetanus, diphtheria, and acellular pertussis (Tdap/Td) vaccine.** / Consult your health care provider. Pregnant women should receive 1 dose of Tdap vaccine during each pregnancy. 1 dose of Td every 10 years.  Varicella vaccine.** / Consult your health care provider. Pregnant females who do not have evidence of immunity should receive the first dose after pregnancy.  Zoster vaccine.** / 1 dose for adults aged 60 years or older.  Measles, mumps, rubella (MMR) vaccine.** / You need at least 1 dose of MMR if you were born in 1957 or later. You may also need a 2nd dose. For females of childbearing age, rubella immunity should be determined. If there is no evidence of immunity, females who are not pregnant should be vaccinated. If there is no evidence of immunity, females who are pregnant should delay immunization until after pregnancy.  Pneumococcal 13-valent conjugate (PCV13) vaccine.** / Consult your health care provider.  Pneumococcal polysaccharide (PPSV23) vaccine.** / 1 to 2 doses if you smoke cigarettes or if you have certain conditions.  Meningococcal vaccine.** / Consult your health care provider.  Hepatitis A vaccine.** / Consult your health care provider.  Hepatitis B vaccine.** / Consult your health care provider.  Haemophilus influenzae type b (Hib) vaccine.** / Consult your health care provider. Ages 65   years and over  Blood pressure check.** / Every 1 to 2 years.  Lipid and cholesterol check.** / Every 5 years beginning at age 22 years.  Lung cancer screening. / Every year if you are aged 73-80 years and have a 30-pack-year history of smoking and currently smoke or have quit within the past 15 years. Yearly screening is stopped once you have quit smoking for at least 15 years or develop a health problem that would prevent you from having lung cancer  treatment.  Clinical breast exam.** / Every year after age 4 years.  BRCA-related cancer risk assessment.** / For women who have family members with a BRCA-related cancer (breast, ovarian, tubal, or peritoneal cancers).  Mammogram.** / Every year beginning at age 40 years and continuing for as long as you are in good health. Consult with your health care provider.  Pap test.** / Every 3 years starting at age 9 years through age 34 or 91 years with 3 consecutive normal Pap tests. Testing can be stopped between 65 and 70 years with 3 consecutive normal Pap tests and no abnormal Pap or HPV tests in the past 10 years.  HPV screening.** / Every 3 years from ages 57 years through ages 64 or 45 years with a history of 3 consecutive normal Pap tests. Testing can be stopped between 65 and 70 years with 3 consecutive normal Pap tests and no abnormal Pap or HPV tests in the past 10 years.  Fecal occult blood test (FOBT) of stool. / Every year beginning at age 15 years and continuing until age 17 years. You may not need to do this test if you get a colonoscopy every 10 years.  Flexible sigmoidoscopy or colonoscopy.** / Every 5 years for a flexible sigmoidoscopy or every 10 years for a colonoscopy beginning at age 86 years and continuing until age 71 years.  Hepatitis C blood test.** / For all people born from [birth year] through 1963 and any individual with known risks for hepatitis C.  Osteoporosis screening.** / A one-time screening for women ages 83 years and over and women at risk for fractures or osteoporosis.  Skin self-exam. / Monthly.  Influenza vaccine. / Every year.  Tetanus, diphtheria, and acellular pertussis (Tdap/Td) vaccine.** / 1 dose of Td every 10 years.  Varicella vaccine.** / Consult your health care provider.  Zoster vaccine.** / 1 dose for adults aged 61 years or older.  Pneumococcal 13-valent conjugate (PCV13) vaccine.** / Consult your health care provider.  Pneumococcal  polysaccharide (PPSV23) vaccine.** / 1 dose for all adults aged 28 years and older.  Meningococcal vaccine.** / Consult your health care provider.  Hepatitis A vaccine.** / Consult your health care provider.  Hepatitis B vaccine.** / Consult your health care provider.  Haemophilus influenzae type b (Hib) vaccine.** / Consult your health care provider. ** Family history and personal history of risk and conditions may change your health care provider's recommendations. Document Released: 10/07/2001 Document Revised: 12/26/2013 Document Reviewed: 01/06/2011 [facility] Patient Information 2015 [place], [place]. This information is not intended to replace advice given to you by your health care provider. Make sure you discuss any questions you have with your health care provider.

## 2014-03-31 NOTE — Progress Notes (Signed)
Subjective:     Katie [name] is a 48 y.o. female and is here for a comprehensive physical exam. The patient reports no problems.  History   Social History  . Marital Status: Married    Spouse Name: N/A    Number of Children: 2  . Years of Education: 15   Occupational History  . 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 Smoking status: Former Smoker -- 1.00 packs/day    Quit date: 01/14/1995  . Smokeless tobacco: Former Systems developer    Quit date: 08/26/1995     Comment: started smoking at age 46 1ppd  . Alcohol Use: 0.5 oz/week    1 drink(s) per week  . Drug Use: No  . Sexual Activity: Yes    Partners: Male   Other Topics Concern  . Not on file   Social History Narrative   Exercise-- no   Health Maintenance  Topic Date Due  . Influenza Vaccine  05/31/2014 (Originally 03/25/2014)  . Mammogram  11/12/2014  . Pap Smear  03/14/2016  . Tetanus/tdap  06/20/2018    The following portions of the patient's history were reviewed and updated as appropriate:  She  has a past medical history of Thyroid disease; RLS (restless legs syndrome); Hyperlipidemia; Allergy; Anemia; Anxiety; and Blood transfusion without reported diagnosis. She  does not have any pertinent problems on file. She  has past surgical history that includes Tubal ligation (04/1999); Carpal tunnel release; Septoplasty (6/05); Cryoablation (11/04); Ectopic pregnancy surgery (1998); and Bunionectomy (06/04/2009). Her family history includes Alzheimer's disease in her maternal grandmother; Aneurysm in her mother; Depression in her father; Diabetes in her maternal grandmother; Hypertension in her maternal grandmother; Lung cancer in her maternal grandfather; Osteoporosis in her maternal grandmother; Uterine cancer in her maternal grandmother. She  reports that she quit smoking about 19 years ago. She quit smokeless tobacco use about 18 years ago. She reports that she drinks about .5 ounces of alcohol per week. She  reports that she does not use illicit drugs. She has a current medication list which includes the following prescription(s): cetirizine, desvenlafaxine, fenofibrate, levothyroxine, and suvorexant. Current Outpatient Prescriptions on File Prior to Visit  Medication Sig Dispense Refill  . cetirizine (ZYRTEC) 10 MG tablet Take 10 mg by mouth daily.      Marland Kitchen desvenlafaxine (PRISTIQ) 100 MG 24 hr tablet Take 1 tablet (100 mg total) by mouth daily.  90 tablet  3   No current facility-administered medications on file prior to visit.   She is allergic to morphine..  Review of Systems Review of Systems  Constitutional: Negative for activity change, appetite change and fatigue.  HENT: Negative for hearing loss, congestion, tinnitus and ear discharge.  dentist q14m Eyes: Negative for visual disturbance (see optho q1y -- vision corrected to 20/20 with glasses).  Respiratory: Negative for cough, chest tightness and shortness of breath.   Cardiovascular: Negative for chest pain, palpitations and leg swelling.  Gastrointestinal: Negative for abdominal pain, diarrhea, constipation and abdominal distention.  Genitourinary: Negative for urgency, frequency, decreased urine volume and difficulty urinating.  Musculoskeletal: Negative for back pain, arthralgias and gait problem.  Skin: Negative for color change, pallor and rash.  Neurological: Negative for dizziness, light-headedness, numbness and headaches.  Hematological: Negative for adenopathy. Does not bruise/bleed easily.  Psychiatric/Behavioral: Negative for suicidal ideas, confusion, sleep disturbance, self-injury, dysphoric mood, decreased concentration and agitation.       Objective:    BP 110/66  Pulse 80  Temp(Src)  84 F (36.7 C) (Oral)  Ht 5\' 3"  (1.6 m)  Wt 186 lb (84.369 kg)  BMI 32.96 kg/m2  SpO2 98%  LMP 03/20/2014 General appearance: alert, cooperative, appears stated age and no distress Head: Normocephalic, without obvious  abnormality, atraumatic Eyes: conjunctivae/corneas clear. PERRL, EOM's intact. Fundi benign. Ears: normal TM's and external ear canals both ears Nose: Nares normal. Septum midline. Mucosa normal. No drainage or sinus tenderness. Throat: lips, mucosa, and tongue normal; teeth and gums normal Neck: no adenopathy, no carotid bruit, no JVD, supple, symmetrical, trachea midline and thyroid not enlarged, symmetric, no tenderness/mass/nodules Back: symmetric, no curvature. ROM normal. No CVA tenderness. Lungs: clear to auscultation bilaterally Breasts: normal appearance, no masses or tenderness Heart: regular rate and rhythm, S1, S2 normal, no murmur, click, rub or gallop Abdomen: soft, non-tender; bowel sounds normal; no masses,  no organomegaly Pelvic: deferred Extremities: extremities normal, atraumatic, no cyanosis or edema Pulses: 2+ and symmetric Skin: Skin color, texture, turgor normal. No rashes or lesions Lymph nodes: Cervical, supraclavicular, and axillary nodes normal. Neurologic: Alert and oriented X 3, normal strength and tone. Normal symmetric reflexes. Normal coordination and gait Psych--no depression, no anxiety      Assessment:    Healthy female exam.      Plan:    ghm utd Check labs See After Visit Summary for Counseling Recommendations   1. Insomnia  - Suvorexant (BELSOMRA) 10 MG TABS; Take 1 tablet by mouth at bedtime as needed.  Dispense: 10 tablet; Refill: 0  2. Preventative health care  - Basic metabolic panel; Future - CBC with Differential; Future - Hepatic function panel; Future - Lipid panel; Future - POCT urinalysis dipstick; Future - TSH; Future

## 2014-04-02 ENCOUNTER — Encounter: Payer: Self-pay | Admitting: Family Medicine

## 2014-04-03 ENCOUNTER — Other Ambulatory Visit (INDEPENDENT_AMBULATORY_CARE_PROVIDER_SITE_OTHER): Payer: BC Managed Care – PPO

## 2014-04-03 DIAGNOSIS — E785 Hyperlipidemia, unspecified: Secondary | ICD-10-CM

## 2014-04-03 DIAGNOSIS — Z0000 Encounter for general adult medical examination without abnormal findings: Secondary | ICD-10-CM

## 2014-04-03 DIAGNOSIS — E039 Hypothyroidism, unspecified: Secondary | ICD-10-CM

## 2014-04-03 LAB — HEPATIC FUNCTION PANEL
ALT: 48 U/L — ABNORMAL HIGH (ref 0–35)
AST: 39 U/L — ABNORMAL HIGH (ref 0–37)
Albumin: 4.3 g/dL (ref 3.5–5.2)
Alkaline Phosphatase: 47 U/L (ref 39–117)
Bilirubin, Direct: 0 mg/dL (ref 0.0–0.3)
Total Bilirubin: 0.6 mg/dL (ref 0.2–1.2)
Total Protein: 7.6 g/dL (ref 6.0–8.3)

## 2014-04-03 LAB — BASIC METABOLIC PANEL
BUN: 17 mg/dL (ref 6–23)
CO2: 28 mEq/L (ref 19–32)
Calcium: 9.6 mg/dL (ref 8.4–10.5)
Chloride: 102 mEq/L (ref 96–112)
Creatinine, Ser: 0.9 mg/dL (ref 0.4–1.2)
GFR: 72.59 mL/min (ref >60.00)
Glucose, Bld: 95 mg/dL (ref 70–99)
Potassium: 4.4 mEq/L (ref 3.5–5.1)
Sodium: 136 mEq/L (ref 135–145)

## 2014-04-03 LAB — CBC WITH DIFFERENTIAL/PLATELET
Basophils Absolute: 0 10*3/uL (ref 0.0–0.1)
Basophils Relative: 0.7 % (ref 0.0–3.0)
Eosinophils Absolute: 0.1 10*3/uL (ref 0.0–0.7)
Eosinophils Relative: 2.4 % (ref 0.0–5.0)
HCT: 41 % (ref 36.0–46.0)
Hemoglobin: 13.9 g/dL (ref 12.0–15.0)
Lymphocytes Relative: 40.6 % (ref 12.0–46.0)
Lymphs Abs: 2 10*3/uL (ref 0.7–4.0)
MCHC: 33.9 g/dL (ref 30.0–36.0)
MCV: 87.4 fl (ref 78.0–100.0)
Monocytes Absolute: 0.3 10*3/uL (ref 0.1–1.0)
Monocytes Relative: 7 % (ref 3.0–12.0)
Neutro Abs: 2.4 10*3/uL (ref 1.4–7.7)
Neutrophils Relative %: 49.3 % (ref 43.0–77.0)
Platelets: 312 10*3/uL (ref 150.0–400.0)
RBC: 4.69 Mil/uL (ref 3.87–5.11)
RDW: 13 % (ref 11.5–15.5)
WBC: 4.9 10*3/uL (ref 4.0–10.5)

## 2014-04-04 LAB — TSH: TSH: 1.37 u[IU]/mL (ref 0.35–4.50)

## 2014-04-04 LAB — LIPID PANEL
Cholesterol: 168 mg/dL (ref 0–200)
HDL: 52.9 mg/dL (ref >39.00)
LDL Cholesterol: 97 mg/dL (ref 0–99)
NonHDL: 115.1
Total CHOL/HDL Ratio: 3
Triglycerides: 92 mg/dL (ref 0.0–149.0)
VLDL: 18.4 mg/dL (ref 0.0–40.0)

## 2014-04-05 MED ORDER — SUVOREXANT 20 MG PO TABS: 1.0000 | ORAL_TABLET | Freq: Every day | ORAL | Status: DC

## 2014-04-05 NOTE — Addendum Note (Signed)
Addended by: Ewing Schlein on: 04/05/2014 09:51 AM   Modules accepted: Orders

## 2014-04-05 NOTE — Telephone Encounter (Signed)
-----   Message from Rosalita Chessman, DO sent at 04/05/2014 9:24 AM -----   Pt needs rx and coupon for 10 free belsorma 20 mg

## 2014-04-14 ENCOUNTER — Other Ambulatory Visit (INDEPENDENT_AMBULATORY_CARE_PROVIDER_SITE_OTHER): Payer: BC Managed Care – PPO

## 2014-04-14 DIAGNOSIS — Z0000 Encounter for general adult medical examination without abnormal findings: Secondary | ICD-10-CM

## 2014-04-15 LAB — LIPID PANEL
Cholesterol: 161 mg/dL (ref 0–200)
HDL: 48.9 mg/dL (ref >39.00)
LDL Cholesterol: 98 mg/dL (ref 0–99)
NonHDL: 112.1
Total CHOL/HDL Ratio: 3
Triglycerides: 69 mg/dL (ref 0.0–149.0)
VLDL: 13.8 mg/dL (ref 0.0–40.0)

## 2014-04-15 LAB — TSH: TSH: 0.34 u[IU]/mL — ABNORMAL LOW (ref 0.35–4.50)

## 2014-04-17 ENCOUNTER — Encounter: Payer: Self-pay | Admitting: Family Medicine

## 2014-04-17 DIAGNOSIS — G4700 Insomnia, unspecified: Secondary | ICD-10-CM

## 2014-04-17 MED ORDER — SUVOREXANT 20 MG PO TABS: 1.0000 | ORAL_TABLET | Freq: Every evening | ORAL | Status: DC | PRN

## 2014-04-17 NOTE — Telephone Encounter (Signed)
Please advise if it is ok to send.     KP

## 2014-04-17 NOTE — Telephone Encounter (Signed)
Sent to pharmacy 

## 2014-04-19 ENCOUNTER — Ambulatory Visit (INDEPENDENT_AMBULATORY_CARE_PROVIDER_SITE_OTHER): Payer: BC Managed Care – PPO | Admitting: Family Medicine

## 2014-04-19 VITALS — BP 114/78 | HR 70 | Temp 97.9°F | Resp 18 | Ht 63.0 in | Wt 179.6 lb

## 2014-04-19 DIAGNOSIS — R197 Diarrhea, unspecified: Secondary | ICD-10-CM

## 2014-04-19 DIAGNOSIS — R112 Nausea with vomiting, unspecified: Secondary | ICD-10-CM

## 2014-04-19 LAB — COMPREHENSIVE METABOLIC PANEL
ALT: 25 U/L (ref 0–35)
AST: 23 U/L (ref 0–37)
Albumin: 4.4 g/dL (ref 3.5–5.2)
Alkaline Phosphatase: 46 U/L (ref 39–117)
BUN: 17 mg/dL (ref 6–23)
CO2: 26 mEq/L (ref 19–32)
Calcium: 9.3 mg/dL (ref 8.4–10.5)
Chloride: 103 mEq/L (ref 96–112)
Creat: 0.93 mg/dL (ref 0.50–1.10)
Glucose, Bld: 97 mg/dL (ref 70–99)
Potassium: 4.5 mEq/L (ref 3.5–5.3)
Sodium: 137 mEq/L (ref 135–145)
Total Bilirubin: 0.4 mg/dL (ref 0.2–1.2)
Total Protein: 7.3 g/dL (ref 6.0–8.3)

## 2014-04-19 LAB — POCT CBC
Granulocyte percent: 82.6 %G — AB (ref 37–80)
HCT, POC: 44.1 % (ref 37.7–47.9)
Hemoglobin: 14.4 g/dL (ref 12.2–16.2)
Lymph, poc: 0.8 (ref 0.6–3.4)
MCH, POC: 28.4 pg (ref 27–31.2)
MCHC: 32.7 g/dL (ref 31.8–35.4)
MCV: 87 fL (ref 80–97)
MID (cbc): 0.2 (ref 0–0.9)
MPV: 7.3 fL (ref 0–99.8)
POC Granulocyte: 4.8 (ref 2–6.9)
POC LYMPH PERCENT: 14.1 %L (ref 10–50)
POC MID %: 3.3 %M (ref 0–12)
Platelet Count, POC: 266 10*3/uL (ref 142–424)
RBC: 5.07 M/uL (ref 4.04–5.48)
RDW, POC: 12.6 %
WBC: 5.8 10*3/uL (ref 4.6–10.2)

## 2014-04-19 LAB — POCT URINALYSIS DIPSTICK
Bilirubin, UA: NEGATIVE
Blood, UA: NEGATIVE
Glucose, UA: NEGATIVE
Ketones, UA: NEGATIVE
Leukocytes, UA: NEGATIVE
Nitrite, UA: NEGATIVE
Protein, UA: NEGATIVE
Spec Grav, UA: <=1.005
Urobilinogen, UA: 0.2
pH, UA: 5.5

## 2014-04-19 LAB — POCT URINE PREGNANCY: Preg Test, Ur: NEGATIVE

## 2014-04-19 MED ORDER — LEVOTHYROXINE SODIUM 112 MCG PO TABS: ORAL_TABLET | ORAL | Status: DC

## 2014-04-19 MED ORDER — ONDANSETRON HCL 4 MG/2ML IJ SOLN
2.0000 mg | Freq: Once | INTRAMUSCULAR | Status: AC
  Administered 2014-04-19: 2 mg via INTRAVENOUS

## 2014-04-19 MED ORDER — ONDANSETRON HCL 8 MG PO TABS: 8.0000 mg | ORAL_TABLET | Freq: Three times a day (TID) | ORAL | Status: DC | PRN

## 2014-04-19 NOTE — Patient Instructions (Signed)
I will be in touch with the rest of your labs.  It seems that you have a viral gastroenteritis.  I hope that you will be feeling better very soon, but please let me know if you do not. Use the zofran as needed for nausea, and try to drink fluids.  You can eat a bland diet- crackers, bananas, dry toast, broth- as tolerated.

## 2014-04-19 NOTE — Addendum Note (Signed)
Addended by: Ewing Schlein on: 04/19/2014 01:26 PM   Modules accepted: Orders

## 2014-04-19 NOTE — Progress Notes (Signed)
[facility] [address] [place] [address] [phone]  Date:  04/19/2014   Name:  Katie [name]   DOB:  01-12-1966   MRN:  [id number]  PCP:  Garnet Koyanagi, DO    Chief Complaint: Headache, Diarrhea and Nausea   History of Present Illness:  Katie [name] is a 48 y.o. very pleasant female patient who presents with the following:  She is here today with nausea and diarrhea, vomiting which started yesterday.  She is still nauseated and having diarrhea, but no vomiting today. She also notes a headache and earache. No ST or cough.   No blood in her diarrhea or vomit.  She is not sure about a fever.   She has tried to eat but cannot eat much, not able to drink much either.  She slept most of the day yesterday, did try to drink some water today.  No sick contacts, no suspicious foods.    Patient Active Problem List   Diagnosis Date Noted  . Obesity (BMI 30-39.9) 03/14/2013  . RESTLESS LEG SYNDROME 12/13/2009  . OBSTRUCTIVE SLEEP APNEA 12/12/2009  . OTHER ANXIETY STATES 02/01/2009  . HYPERLIPIDEMIA 01/19/2009  . HYPOKALEMIA, MILD 01/19/2009  . DYSPEPSIA 09/28/2008  . INSOMNIA, CHRONIC 07/03/2008  . PLANTAR FASCIITIS, BILATERAL 06/20/2008  . HYPOTHYROIDISM 02/22/2007  . DEVIATED NASAL SEPTUM 02/22/2007  . PREGNANCY, ECTOPIC NEC W/INTRAUTERINE PRG 02/22/2007  . HEADACHE 02/22/2007  . Other Postprocedural Status 02/22/2007    Past Medical History  Diagnosis Date  . Thyroid disease   . RLS (restless legs syndrome)   . Hyperlipidemia   . Allergy   . Anemia   . Anxiety   . Blood transfusion without reported diagnosis     Past Surgical History  Procedure Laterality Date  . Tubal ligation  04/1999  . Carpal tunnel release      Bilateral  . Septoplasty  6/05  . Cryoablation  11/04  . Ectopic pregnancy surgery  1998  . Bunionectomy  06/04/2009    Right foot    History  Substance Use Topics  . Smoking status: Former Smoker -- 1.00 packs/day    Quit  date: 01/14/1995  . Smokeless tobacco: Former Systems developer    Quit date: 08/26/1995     Comment: started smoking at age 24 1ppd  . Alcohol Use: 0.5 oz/week    1 drink(s) per week    Family History  Problem Relation Age of Onset  . Depression Father     Suicide  . Aneurysm Mother     Brain  . Diabetes Maternal Grandmother   . Alzheimer's disease Maternal Grandmother   . Uterine cancer Maternal Grandmother   . Hypertension Maternal Grandmother   . Osteoporosis Maternal Grandmother   . Lung cancer Maternal Grandfather     Allergies  Allergen Reactions  . Morphine     Nausea & vomiting    Medication list has been reviewed and updated.  Current Outpatient Prescriptions on File Prior to Visit  Medication Sig Dispense Refill  . cetirizine (ZYRTEC) 10 MG tablet Take 10 mg by mouth daily.      Marland Kitchen desvenlafaxine (PRISTIQ) 100 MG 24 hr tablet Take 1 tablet (100 mg total) by mouth daily.  90 tablet  3  . fenofibrate 160 MG tablet TAKE ONE TABLET BY MOUTH ONCE DAILY  30 tablet  0  . levothyroxine (SYNTHROID, LEVOTHROID) 125 MCG tablet TAKE ONE TABLET BY MOUTH EVERY DAY  90 tablet  0  . Suvorexant (  BELSOMRA) 20 MG TABS Take 1 tablet by mouth at bedtime as needed.  30 tablet  5   No current facility-administered medications on file prior to visit.    Review of Systems:  As per HPI- otherwise negative.   Physical Examination: Filed Vitals:   04/19/14 0849  BP: 114/78  Pulse: 70  Temp: 97.9 F (36.6 C)  Resp: 18   Filed Vitals:   04/19/14 0849  Height: 5\' 3"  (1.6 m)  Weight: 179 lb 9.6 oz (81.466 kg)   Body mass index is 31.82 kg/(m^2). Ideal Body Weight: Weight in (lb) to have BMI = 25: 140.8  GEN: WDWN, NAD, Non-toxic, A & O x 3, laying down in room HEENT: Atraumatic, Normocephalic. Neck supple. No masses, No LAD. Ears and Nose: No external deformity. CV: RRR, No M/G/R. No JVD. No thrill. No extra heart sounds. PULM: CTA B, no wheezes, crackles, rhonchi. No retractions. No  resp. distress. No accessory muscle use. ABD: S, NT, ND, +BS. No rebound. No HSM.  Benign exam EXTR: No c/c/e NEURO Normal gait.  PSYCH: Normally interactive. Conversant. Not depressed or anxious appearing.  Calm demeanor.   IV started right ACJ.  Given 4mg  of IV zofran  Results for orders placed in visit on 04/19/14  POCT CBC      Result Value Ref Range   WBC 5.8  4.6 - 10.2 K/uL   Lymph, poc 0.8  0.6 - 3.4   POC LYMPH PERCENT 14.1  10 - 50 %L   MID (cbc) 0.2  0 - 0.9   POC MID % 3.3  0 - 12 %M   POC Granulocyte 4.8  2 - 6.9   Granulocyte percent 82.6 (*) 37 - 80 %G   RBC 5.07  4.04 - 5.48 M/uL   Hemoglobin 14.4  12.2 - 16.2 g/dL   HCT, POC 44.1  37.7 - 47.9 %   MCV 87.0  80 - 97 fL   MCH, POC 28.4  27 - 31.2 pg   MCHC 32.7  31.8 - 35.4 g/dL   RDW, POC 12.6     Platelet Count, POC 266  142 - 424 K/uL   MPV 7.3  0 - 99.8 fL  POCT URINALYSIS DIPSTICK      Result Value Ref Range   Color, UA yellow     Clarity, UA clear     Glucose, UA neg     Bilirubin, UA neg     Ketones, UA neg     Spec Grav, UA <=1.005     Blood, UA neg     pH, UA 5.5     Protein, UA neg     Urobilinogen, UA 0.2     Nitrite, UA neg     Leukocytes, UA Negative    POCT URINE PREGNANCY      Result Value Ref Range   Preg Test, Ur Negative     Given 2L of IVF  Assessment and Plan: Non-intractable vomiting with nausea, vomiting of unspecified type - Plan: POCT CBC, Comprehensive metabolic panel, ondansetron (ZOFRAN) injection 2 mg, ondansetron (ZOFRAN) 8 MG tablet, POCT urinalysis dipstick, POCT urine pregnancy  Diarrhea - Plan: POCT CBC, Comprehensive metabolic panel  Likely viral gastroenteritis.  Treated with 2L of IVF and felt better.  Await CMP, given zofran IV and po Rx.   See patient instructions for more details.     Signed Lamar Blinks, MD

## 2014-04-20 ENCOUNTER — Telehealth: Payer: Self-pay

## 2014-04-20 ENCOUNTER — Ambulatory Visit (INDEPENDENT_AMBULATORY_CARE_PROVIDER_SITE_OTHER): Payer: BC Managed Care – PPO | Admitting: Family Medicine

## 2014-04-20 VITALS — BP 116/80 | HR 64 | Temp 98.0°F | Resp 18 | Ht 63.0 in | Wt 182.6 lb

## 2014-04-20 DIAGNOSIS — B9789 Other viral agents as the cause of diseases classified elsewhere: Secondary | ICD-10-CM

## 2014-04-20 DIAGNOSIS — B349 Viral infection, unspecified: Secondary | ICD-10-CM

## 2014-04-20 MED ORDER — FLUTICASONE PROPIONATE 50 MCG/ACT NA SUSP: 2.0000 | Freq: Every day | NASAL | Status: DC

## 2014-04-20 NOTE — Assessment & Plan Note (Signed)
Overall her gastroenteritis symptoms seemed well controlled with the Zofran she's taking. Her facial pressure is likely also associated with a viral etiology and we discussed symptomatic treatment including Flonase, decongestants and NSAIDs. Discussed warning flags for need to return for secondary bacterial infection including fever, worsening symptoms after 10 days. Patient voiced understanding.

## 2014-04-20 NOTE — Patient Instructions (Signed)
Try taking 600mg  of Ibuprofen three times per day Start flonase 2 sprays once daily.  This medication takes time to work Use Pseudofed (behind the counter) as directed.  Viral Infections A viral infection can be caused by different types of viruses.Most viral infections are not serious and resolve on their own. However, some infections may cause severe symptoms and may lead to further complications. SYMPTOMS Viruses can frequently cause:  Minor sore throat.  Aches and pains.  Headaches.  Runny nose.  Different types of rashes.  Watery eyes.  Tiredness.  Cough.  Loss of appetite.  Gastrointestinal infections, resulting in nausea, vomiting, and diarrhea. These symptoms do not respond to antibiotics because the infection is not caused by bacteria. However, you might catch a bacterial infection following the viral infection. This is sometimes called a "superinfection." Symptoms of such a bacterial infection may include:  Worsening sore throat with pus and difficulty swallowing.  Swollen neck glands.  Chills and a high or persistent fever.  Severe headache.  Tenderness over the sinuses.  Persistent overall ill feeling (malaise), muscle aches, and tiredness (fatigue).  Persistent cough.  Yellow, green, or brown mucus production with coughing. HOME CARE INSTRUCTIONS   Only take over-the-counter or prescription medicines for pain, discomfort, diarrhea, or fever as directed by your caregiver.  Drink enough water and fluids to keep your urine clear or pale yellow. Sports drinks can provide valuable electrolytes, sugars, and hydration.  Get plenty of rest and maintain proper nutrition. Soups and broths with crackers or rice are fine. SEEK IMMEDIATE MEDICAL CARE IF:   You have severe headaches, shortness of breath, chest pain, neck pain, or an unusual rash.  You have uncontrolled vomiting, diarrhea, or you are unable to keep down fluids.  You or your child has an oral  temperature above 102 F (38.9 C), not controlled by medicine.  Your baby is older than 3 months with a rectal temperature of 102 F (38.9 C) or higher.  Your baby is 55 months old or younger with a rectal temperature of 100.4 F (38 C) or higher. MAKE SURE YOU:   Understand these instructions.  Will watch your condition.  Will get help right away if you are not doing well or get worse. Document Released: 05/21/2005 Document Revised: 11/03/2011 Document Reviewed: 12/16/2010 [facility] Patient Information 2015 [place], [place]. This information is not intended to replace advice given to you by your health care provider. Make sure you discuss any questions you have with your health care provider.

## 2014-04-20 NOTE — Telephone Encounter (Signed)
Pt was seen for diarrhea, N/V. Pt was not evaluated for a sinus infection.  LM for pt to RTC to be evaluated.

## 2014-04-20 NOTE — Telephone Encounter (Signed)
Error

## 2014-04-20 NOTE — Progress Notes (Signed)
  Katie [name] - 48 y.o. female MRN [id number]  Date of birth: 09-Apr-1966  SUBJECTIVE:  Including CC & ROS.  The patient is following up for reevaluation of: Viral syndrome with left-sided ear pain and facial pressure: She was seen yesterday and diagnosed with viral gastroenteritis. Was given 2 L of normal saline and responded well. She reports overall improvement of her nausea, vomiting, and is otherwise feeling better in this regard. She does have persistent left-sided ear pain and facial pressure that seems to be worsening. She denies any new fevers. No significant cough, congestion, shortness of breath. Her symptoms have been present for 5 days. Otherwise prior to this she was feeling well.     HISTORY: Past Medical, Surgical, Social, and Family History Reviewed & Updated per EMR. Pertinent Historical Findings include: Per history of allergic symptoms treated with over-the-counter Claritin  DATA REVIEWED: Office visit from yesterday  PHYSICAL EXAM:  VS: BP:116/80 mmHg  HR:64bpm  TEMP:98 F (36.7 C)(Oral)  RESP:98 %  HT:5\' 3"  (160 cm)   WT:182 lb 9.6 oz (82.827 kg)  BMI:32.4 PHYSICAL EXAM: GENERAL:  Adult Caucasian female. In no discomfort; no respiratory distress  PSYCH:  alert and appropriate, good insight  HNEENT:  mmm, no JVD, bilateral serous effusions without erythema or bulging. Slight bilateral frontal and maxillary pressure. she has no anterior or posterior cervical lymphadenopathy. She does have cobblestoning of the nasal mucosa  EXTREM:  Warm, well perfused.  Moves all 4 extremities spontaneously; no lateralization.  No pretibial edema.   ASSESSMENT & PLAN: See problem based charting & AVS for pt instructions.

## 2014-04-20 NOTE — Telephone Encounter (Signed)
COPLAND - PT called back stating that she did discuss with you during her last visit about her headache, face and ear pain. Said her ears were red and irritated and she was told to call back if she didn't feel better today. She called earlier and was told that was not what she was seen for during the last visit and that if it was something new she needed to come back in.  Wants to know if it is absolutely necessary for her to come back due to the last visit discussions.   [phone]

## 2014-04-20 NOTE — Telephone Encounter (Signed)
Dr Lorelei Pont  Patient has pain behind her eyes and ears.  Believes it is a sinus infection.  Seen Yesterday   Requesting antibiotics   [facility] on [address]   [phone]

## 2014-04-20 NOTE — Telephone Encounter (Signed)
Patient called back to check on the status of her antibiotic request.   [phone]

## 2014-04-21 ENCOUNTER — Encounter: Payer: Self-pay | Admitting: Family Medicine

## 2014-04-21 NOTE — Telephone Encounter (Signed)
Spoke to pt, she was seen 04/20/2014 after her phone message.  This issue has been resolved

## 2014-04-21 NOTE — Progress Notes (Signed)
History and physical examinations reviewed in detail. Agree with Assessment and Plan.

## 2014-05-03 ENCOUNTER — Encounter: Payer: Self-pay | Admitting: Family Medicine

## 2014-05-10 ENCOUNTER — Encounter: Payer: Self-pay | Admitting: Family Medicine

## 2014-05-11 ENCOUNTER — Telehealth: Payer: Self-pay

## 2014-05-11 MED ORDER — FENOFIBRATE 160 MG PO TABS: ORAL_TABLET | ORAL | Status: DC

## 2014-05-11 NOTE — Telephone Encounter (Signed)
Fenofibrate faxed     KP

## 2014-06-12 ENCOUNTER — Encounter: Payer: Self-pay | Admitting: Family Medicine

## 2014-06-13 NOTE — Telephone Encounter (Signed)
Jess have you received Pa for this patient?

## 2014-06-15 NOTE — Telephone Encounter (Signed)
PA initiated. JG//CMA

## 2014-06-22 MED ORDER — QUETIAPINE FUMARATE 25 MG PO TABS: 25.0000 mg | ORAL_TABLET | Freq: Every day | ORAL | Status: DC

## 2014-06-22 NOTE — Telephone Encounter (Signed)
Katie [name] [phone] [place] called to check on Prior Authorization for Suvorexant [facility]) 20 MG TABS, she is now completely out of her sleep medicine, so she needs maybe possible the QUEtiapine (SEROQUEL) 25 MG tablet that she used to be on called in.

## 2014-06-22 NOTE — Telephone Encounter (Signed)
Ok to fill seroquel 25 mg #30 1po qhs prn

## 2014-06-22 NOTE — Telephone Encounter (Signed)
Did her PA come through

## 2014-07-06 ENCOUNTER — Other Ambulatory Visit: Payer: Self-pay | Admitting: Family Medicine

## 2014-07-07 MED ORDER — QUETIAPINE FUMARATE 50 MG PO TABS: 50.0000 mg | ORAL_TABLET | Freq: Every day | ORAL | Status: DC

## 2014-07-25 ENCOUNTER — Encounter: Payer: Self-pay | Admitting: Family Medicine

## 2014-07-25 DIAGNOSIS — E0590 Thyrotoxicosis, unspecified without thyrotoxic crisis or storm: Secondary | ICD-10-CM

## 2014-07-26 MED ORDER — LEVOTHYROXINE SODIUM 112 MCG PO TABS: ORAL_TABLET | ORAL | Status: DC

## 2014-07-28 ENCOUNTER — Other Ambulatory Visit (INDEPENDENT_AMBULATORY_CARE_PROVIDER_SITE_OTHER): Payer: BC Managed Care – PPO

## 2014-07-28 ENCOUNTER — Ambulatory Visit (INDEPENDENT_AMBULATORY_CARE_PROVIDER_SITE_OTHER): Payer: BC Managed Care – PPO | Admitting: *Deleted

## 2014-07-28 DIAGNOSIS — E0590 Thyrotoxicosis, unspecified without thyrotoxic crisis or storm: Secondary | ICD-10-CM

## 2014-07-28 DIAGNOSIS — Z23 Encounter for immunization: Secondary | ICD-10-CM

## 2014-07-29 LAB — TSH: TSH: 3.79 u[IU]/mL (ref 0.35–4.50)

## 2014-07-31 ENCOUNTER — Telehealth: Payer: Self-pay | Admitting: *Deleted

## 2014-07-31 ENCOUNTER — Encounter: Payer: Self-pay | Admitting: Family Medicine

## 2014-07-31 ENCOUNTER — Ambulatory Visit (INDEPENDENT_AMBULATORY_CARE_PROVIDER_SITE_OTHER): Payer: BC Managed Care – PPO | Admitting: Family Medicine

## 2014-07-31 VITALS — BP 110/70 | HR 80 | Temp 98.9°F | Wt 181.6 lb

## 2014-07-31 DIAGNOSIS — G4700 Insomnia, unspecified: Secondary | ICD-10-CM

## 2014-07-31 DIAGNOSIS — J0110 Acute frontal sinusitis, unspecified: Secondary | ICD-10-CM

## 2014-07-31 IMAGING — CR DG HAND COMPLETE 3+V*L*
3 series · 3 of 3 positions shown · non-contrast
Comparison: 06/10/2005.

CLINICAL DATA: Bilateral hand pain / discomfort.  No acute injury.

LEFT HAND - COMPLETE 3+ VIEW

[x hand pa left]
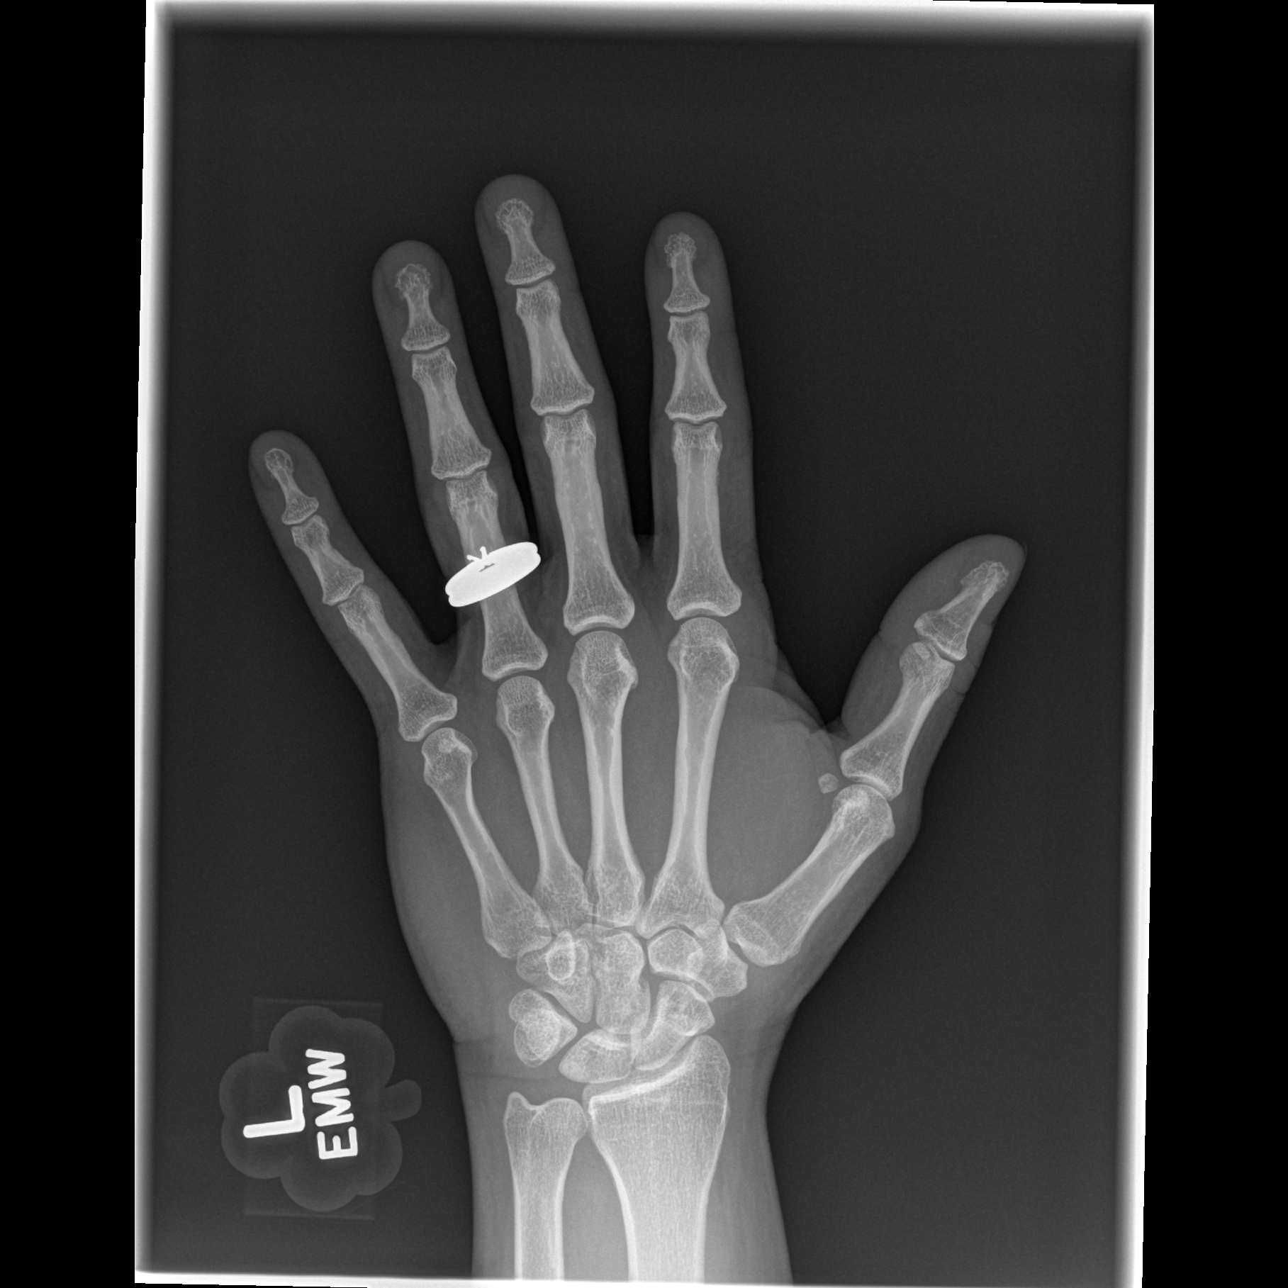

[x hand oblique left]
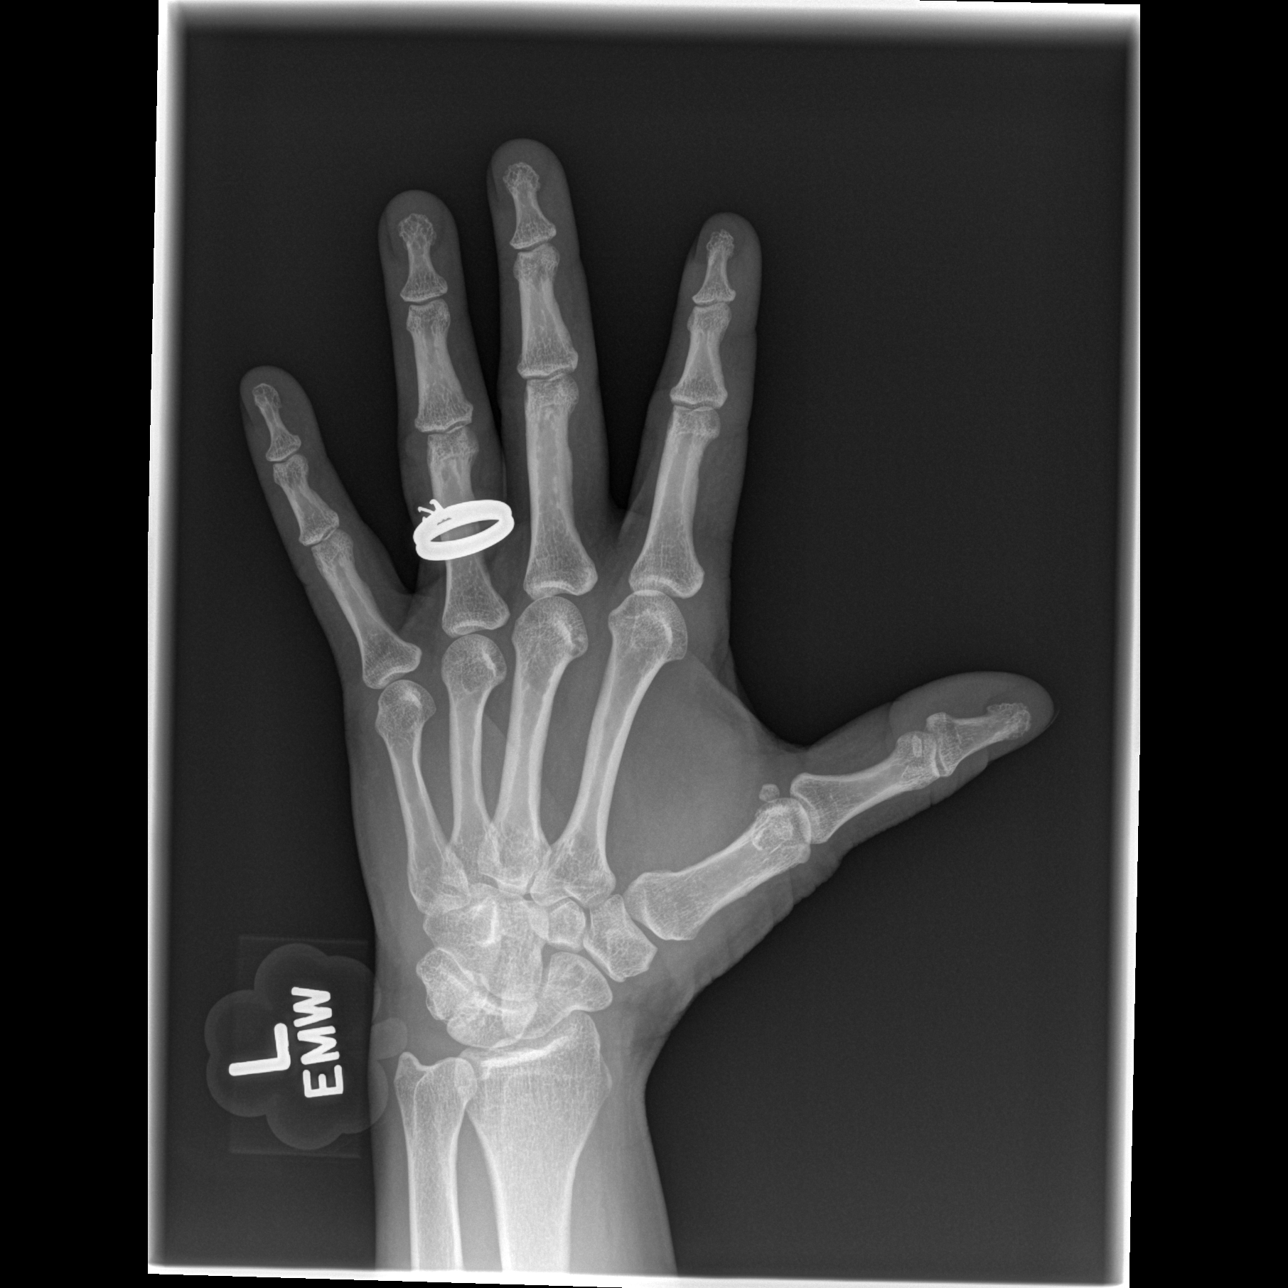

[x hand lat left]
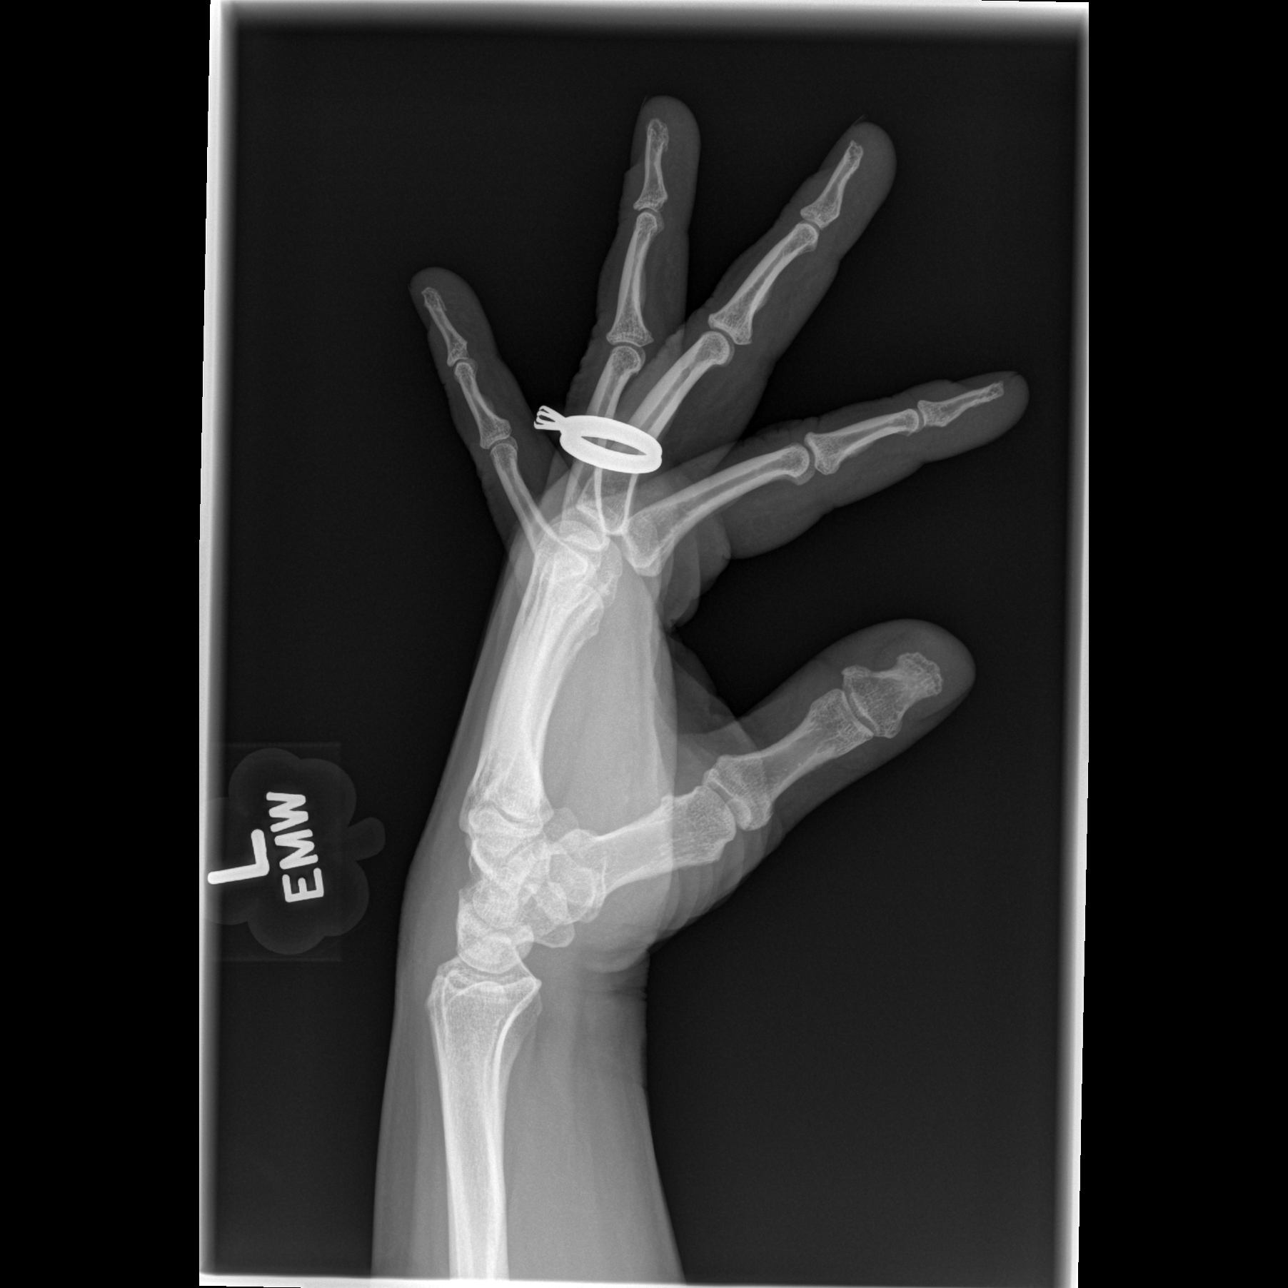

[3 of 3 positions shown; findings below may reference images not displayed]

FINDINGS: A metallic ring is present on the ring finger. The
mineralization and alignment are normal.  There is no evidence of
acute fracture or dislocation.  The joint spaces are maintained.
No erosive changes are identified.
IMPRESSION: Stable examination.  No acute osseous findings or significant
arthropathic changes demonstrated.

## 2014-07-31 MED ORDER — FLUTICASONE PROPIONATE 50 MCG/ACT NA SUSP: 2.0000 | Freq: Every day | NASAL | Status: DC

## 2014-07-31 MED ORDER — AMOXICILLIN-POT CLAVULANATE 875-125 MG PO TABS: 1.0000 | ORAL_TABLET | Freq: Two times a day (BID) | ORAL | Status: DC

## 2014-07-31 MED ORDER — SUVOREXANT 20 MG PO TABS: 1.0000 | ORAL_TABLET | Freq: Every day | ORAL | Status: DC

## 2014-07-31 NOTE — Telephone Encounter (Signed)
PA for Belsomra initiated. Awaiting determination. JG//CMA 

## 2014-07-31 NOTE — Progress Notes (Signed)
  Subjective:     Katie [name] is a 48 y.o. female who presents for evaluation of sinus pain. Symptoms include: congestion, cough, nasal congestion and productive cough. Onset of symptoms was 1 week ago. Symptoms have been gradually worsening since that time. Past history is significant for no history of pneumonia or bronchitis. Patient is a non-smoker.  The following portions of the patient's history were reviewed and updated as appropriate: allergies, current medications, past family history, past medical history, past social history, past surgical history and problem list.  Review of Systems Pertinent items are noted in HPI.   Objective:    BP 110/70 mmHg  Pulse 80  Temp(Src) 98.9 F (37.2 C) (Oral)  Wt 181 lb 9.6 oz (82.373 kg)  SpO2 98% General appearance: alert, cooperative, appears stated age and no distress Ears: normal TM's and external ear canals both ears Nose: green discharge, moderate congestion, turbinates red, swollen, no sinus tenderness Throat: lips, mucosa, and tongue normal; teeth and gums normal Neck: no adenopathy, supple, symmetrical, trachea midline and thyroid not enlarged, symmetric, no tenderness/mass/nodules Lungs: clear to auscultation bilaterally Heart: S1, S2 normal    Assessment:    Acute sinusitis-- bronchitis.    Plan:    Nasal steroids per medication orders. Antihistamines per medication orders. Augmentin per medication orders.

## 2014-07-31 NOTE — Progress Notes (Signed)
Pre visit review using our clinic review tool, if applicable. No additional management support is needed unless otherwise documented below in the visit note. 

## 2014-07-31 NOTE — Patient Instructions (Signed)

## 2014-08-09 ENCOUNTER — Encounter: Payer: Self-pay | Admitting: Family Medicine

## 2014-08-09 MED ORDER — FENOFIBRATE 160 MG PO TABS: ORAL_TABLET | ORAL | Status: DC

## 2014-08-19 ENCOUNTER — Encounter: Payer: Self-pay | Admitting: Family Medicine

## 2014-08-22 ENCOUNTER — Other Ambulatory Visit: Payer: Self-pay

## 2014-08-22 ENCOUNTER — Telehealth: Payer: Self-pay

## 2014-08-22 MED ORDER — LEVOTHYROXINE SODIUM 112 MCG PO TABS: ORAL_TABLET | ORAL | Status: DC

## 2014-08-22 NOTE — Telephone Encounter (Signed)
PA approved Ref #[id number] effective 08/22/14-08/24/2038.  Approval has been faxed to [facility] drug and copy sent to be scanned.       KP

## 2014-08-22 NOTE — Telephone Encounter (Signed)
Patient sent a mychart message in reference to her Belsomra Refill, She advised the Rx needed a PA. I made the patient aware it was in process but I will call the insurance company, I called [employer] and had been advised that they never received a PA request. They sent me the forms and I have completed and faxed back to the insurance company. Confirmation received. I will check the status of the PA tomorrow. Forms faxed to [phone]      KP

## 2014-08-31 ENCOUNTER — Other Ambulatory Visit: Payer: Self-pay | Admitting: Family Medicine

## 2014-08-31 DIAGNOSIS — E039 Hypothyroidism, unspecified: Secondary | ICD-10-CM

## 2014-10-03 ENCOUNTER — Ambulatory Visit (INDEPENDENT_AMBULATORY_CARE_PROVIDER_SITE_OTHER): Payer: BLUE CROSS/BLUE SHIELD | Admitting: Family Medicine

## 2014-10-03 ENCOUNTER — Encounter: Payer: Self-pay | Admitting: Family Medicine

## 2014-10-03 VITALS — BP 100/62 | HR 78 | Temp 98.7°F | Wt 180.2 lb

## 2014-10-03 DIAGNOSIS — E039 Hypothyroidism, unspecified: Secondary | ICD-10-CM

## 2014-10-03 DIAGNOSIS — E785 Hyperlipidemia, unspecified: Secondary | ICD-10-CM

## 2014-10-03 DIAGNOSIS — G2581 Restless legs syndrome: Secondary | ICD-10-CM

## 2014-10-03 LAB — THYROID PANEL WITH TSH
Free Thyroxine Index: 2.8 (ref 1.4–3.8)
T3 Uptake: 30 % (ref 22–35)
T4, Total: 9.4 ug/dL (ref 4.5–12.0)
TSH: 2.687 u[IU]/mL (ref 0.350–4.500)

## 2014-10-03 MED ORDER — PRAMIPEXOLE DIHYDROCHLORIDE 0.25 MG PO TABS: ORAL_TABLET | ORAL | Status: DC

## 2014-10-03 NOTE — Progress Notes (Signed)
Pre visit review using our clinic review tool, if applicable. No additional management support is needed unless otherwise documented below in the visit note. 

## 2014-10-03 NOTE — Assessment & Plan Note (Signed)
Check labs today °con't synthroid °

## 2014-10-03 NOTE — Assessment & Plan Note (Signed)
mirapex Consider f/u pulm for possible cpap if mirapex does not help

## 2014-10-03 NOTE — Addendum Note (Signed)
Addended by: Peggyann Shoals on: 10/03/2014 01:39 PM   Modules accepted: Orders

## 2014-10-03 NOTE — Assessment & Plan Note (Addendum)
Check labs con't fenofibrate

## 2014-10-03 NOTE — Progress Notes (Signed)
   Subjective:    Patient ID: Katie [name], female    DOB: 11/16/1965, 49 y.o.   MRN: [id number]  HPI  Patient here for f/u thyroid.  She is also due for cholesterol check.    Past Medical History  Diagnosis Date  . Thyroid disease   . RLS (restless legs syndrome)   . Hyperlipidemia   . Allergy   . Anemia   . Anxiety   . Blood transfusion without reported diagnosis     Review of Systems  Constitutional: Positive for fatigue. Negative for activity change, appetite change and unexpected weight change.  Respiratory: Negative for cough and shortness of breath.   Cardiovascular: Negative for chest pain and palpitations.  Endocrine: Negative for cold intolerance, heat intolerance, polydipsia, polyphagia and polyuria.  Psychiatric/Behavioral: Positive for sleep disturbance. Negative for suicidal ideas, hallucinations, behavioral problems, confusion, self-injury, dysphoric mood, decreased concentration and agitation. The patient is not nervous/anxious and is not hyperactive.        Objective:    Physical Exam  Constitutional: She is oriented to person, place, and time. She appears well-developed and well-nourished. No distress.  HENT:  Right Ear: External ear normal.  Left Ear: External ear normal.  Nose: Nose normal.  Mouth/Throat: Oropharynx is clear and moist.  Eyes: EOM are normal. Pupils are equal, round, and reactive to light.  Neck: Normal range of motion. Neck supple.  Cardiovascular: Normal rate, regular rhythm and normal heart sounds.   No murmur heard. Pulmonary/Chest: Effort normal and breath sounds normal. No respiratory distress. She has no wheezes. She has no rales. She exhibits no tenderness.  Neurological: She is alert and oriented to person, place, and time.  Psychiatric: She has a normal mood and affect. Her behavior is normal. Judgment and thought content normal.    BP 100/62 mmHg  Pulse 78  Temp(Src) 98.7 F (37.1 C) (Oral)  Wt 180 lb 3.2 oz (81.738 kg)   SpO2 95% Wt Readings from Last 3 Encounters:  10/03/14 180 lb 3.2 oz (81.738 kg)  07/31/14 181 lb 9.6 oz (82.373 kg)  04/20/14 182 lb 9.6 oz (82.827 kg)     Lab Results  Component Value Date   WBC 5.8 04/19/2014   HGB 14.4 04/19/2014   HCT 44.1 04/19/2014   PLT 312.0 04/03/2014   GLUCOSE 97 04/19/2014   CHOL 161 04/14/2014   TRIG 69.0 04/14/2014   HDL 48.90 04/14/2014   LDLDIRECT 138.3 06/20/2008   LDLCALC 98 04/14/2014   ALT 25 04/19/2014   AST 23 04/19/2014   NA 137 04/19/2014   K 4.5 04/19/2014   CL 103 04/19/2014   CREATININE 0.93 04/19/2014   BUN 17 04/19/2014   CO2 26 04/19/2014   TSH 3.79 07/28/2014    No results found.     Assessment & Plan:   Problem List Items Addressed This Visit    RLS (restless legs syndrome)    mirapex Consider f/u pulm for possible cpap if mirapex does not help      Relevant Medications   pramipexole (MIRAPEX) tablet   Hypothyroidism - Primary    Check labs today con't synthroid      Relevant Orders   Thyroid Panel With TSH   Hyperlipidemia    Check labs con't fenofibrate       Relevant Orders   Hepatic function panel   Lipid panel       Garnet Koyanagi, DO

## 2014-10-03 NOTE — Addendum Note (Signed)
Addended by: Peggyann Shoals on: 10/03/2014 09:46 AM   Modules accepted: Orders

## 2014-10-03 NOTE — Patient Instructions (Signed)
Hypothyroidism The thyroid is a large gland located in the lower front of your neck. The thyroid gland helps control metabolism. Metabolism is how your body handles food. It controls metabolism with the hormone thyroxine. When this gland is underactive (hypothyroid), it produces too little hormone.  CAUSES These include:   Absence or destruction of thyroid tissue.  Goiter due to iodine deficiency.  Goiter due to medications.  Congenital defects (since birth).  Problems with the pituitary. This causes a lack of TSH (thyroid stimulating hormone). This hormone tells the thyroid to turn out more hormone. SYMPTOMS  Lethargy (feeling as though you have no energy)  Cold intolerance  Weight gain (in spite of normal food intake)  Dry skin  Coarse hair  Menstrual irregularity (if severe, may lead to infertility)  Slowing of thought processes Cardiac problems are also caused by insufficient amounts of thyroid hormone. Hypothyroidism in the newborn is cretinism, and is an extreme form. It is important that this form be treated adequately and immediately or it will lead rapidly to retarded physical and mental development. DIAGNOSIS  To prove hypothyroidism, your caregiver may do blood tests and ultrasound tests. Sometimes the signs are hidden. It may be necessary for your caregiver to watch this illness with blood tests either before or after diagnosis and treatment. TREATMENT  Low levels of thyroid hormone are increased by using synthetic thyroid hormone. This is a safe, effective treatment. It usually takes about four weeks to gain the full effects of the medication. After you have the full effect of the medication, it will generally take another four weeks for problems to leave. Your caregiver may start you on low doses. If you have had heart problems the dose may be gradually increased. It is generally not an emergency to get rapidly to normal. HOME CARE INSTRUCTIONS   Take your  medications as your caregiver suggests. Let your caregiver know of any medications you are taking or start taking. Your caregiver will help you with dosage schedules.  As your condition improves, your dosage needs may increase. It will be necessary to have continuing blood tests as suggested by your caregiver.  Report all suspected medication side effects to your caregiver. SEEK MEDICAL CARE IF: Seek medical care if you develop:  Sweating.  Tremulousness (tremors).  Anxiety.  Rapid weight loss.  Heat intolerance.  Emotional swings.  Diarrhea.  Weakness. SEEK IMMEDIATE MEDICAL CARE IF:  You develop chest pain, an irregular heart beat (palpitations), or a rapid heart beat. MAKE SURE YOU:   Understand these instructions.  Will watch your condition.  Will get help right away if you are not doing well or get worse. Document Released: 08/11/2005 Document Revised: 11/03/2011 Document Reviewed: 03/31/2008 ExitCare Patient Information 2015 [employer]. This information is not intended to replace advice given to you by your health care provider. Make sure you discuss any questions you have with your health care provider.  

## 2014-10-04 ENCOUNTER — Other Ambulatory Visit (INDEPENDENT_AMBULATORY_CARE_PROVIDER_SITE_OTHER): Payer: BLUE CROSS/BLUE SHIELD

## 2014-10-04 DIAGNOSIS — E785 Hyperlipidemia, unspecified: Secondary | ICD-10-CM

## 2014-10-04 LAB — HEPATIC FUNCTION PANEL
ALT: 33 U/L (ref 0–35)
AST: 29 U/L (ref 0–37)
Albumin: 4.5 g/dL (ref 3.5–5.2)
Alkaline Phosphatase: 51 U/L (ref 39–117)
Bilirubin, Direct: 0.1 mg/dL (ref 0.0–0.3)
Total Bilirubin: 0.4 mg/dL (ref 0.2–1.2)
Total Protein: 7.5 g/dL (ref 6.0–8.3)

## 2014-10-04 LAB — LIPID PANEL
Cholesterol: 154 mg/dL (ref 0–200)
HDL: 51.1 mg/dL (ref >39.00)
LDL Cholesterol: 87 mg/dL (ref 0–99)
NonHDL: 102.9
Total CHOL/HDL Ratio: 3
Triglycerides: 78 mg/dL (ref 0.0–149.0)
VLDL: 15.6 mg/dL (ref 0.0–40.0)

## 2014-10-04 NOTE — Addendum Note (Signed)
Addended by: Peggyann Shoals on: 10/04/2014 08:26 AM   Modules accepted: Orders

## 2014-10-06 ENCOUNTER — Encounter: Payer: Self-pay | Admitting: Family Medicine

## 2014-11-11 ENCOUNTER — Encounter: Payer: Self-pay | Admitting: Family Medicine

## 2014-11-11 DIAGNOSIS — G4700 Insomnia, unspecified: Secondary | ICD-10-CM

## 2014-11-13 MED ORDER — SUVOREXANT 20 MG PO TABS: 1.0000 | ORAL_TABLET | Freq: Every day | ORAL | Status: DC

## 2014-11-13 MED ORDER — FENOFIBRATE 160 MG PO TABS: ORAL_TABLET | ORAL | Status: DC

## 2014-11-13 NOTE — Telephone Encounter (Signed)
Belsomra last seen 07/31/14 #30 with 2 refills. Please advise     KP

## 2014-11-13 NOTE — Telephone Encounter (Signed)
Ok to send 6 months of all 3

## 2014-11-26 ENCOUNTER — Ambulatory Visit (INDEPENDENT_AMBULATORY_CARE_PROVIDER_SITE_OTHER): Payer: BLUE CROSS/BLUE SHIELD | Admitting: Emergency Medicine

## 2014-11-26 VITALS — BP 120/78 | HR 76 | Temp 98.1°F | Resp 16 | Ht 64.0 in | Wt 178.8 lb

## 2014-11-26 DIAGNOSIS — N943 Premenstrual tension syndrome: Secondary | ICD-10-CM | POA: Diagnosis not present

## 2014-11-26 DIAGNOSIS — G43829 Menstrual migraine, not intractable, without status migrainosus: Secondary | ICD-10-CM | POA: Diagnosis not present

## 2014-11-26 MED ORDER — HYDROCODONE-ACETAMINOPHEN 5-325 MG PO TABS: 1.0000 | ORAL_TABLET | ORAL | Status: DC | PRN

## 2014-11-26 NOTE — Patient Instructions (Signed)

## 2014-11-26 NOTE — Progress Notes (Signed)
[facility] [address], [place] [address] [phone]  Date:  11/26/2014   Name:  Katie [name]   DOB:  1966/03/02   MRN:  [id number]  PCP:  Garnet Koyanagi, DO    Chief Complaint: Headache and Diarrhea   History of Present Illness:  Katie [name] is a 49 y.o. very pleasant female patient who presents with the following:  History of migraines presenting just prior to onset of menses. Says at the time they were global and pounding. She has not had a migraine in "a while" She has a several day history of bandlike headache that began at onset of menses Constant over past 3 days. Associated with nausea and vomiting No associated neuro symptoms Has some photophobia and reaction to loud noises. No provocative factors Previously imaged No improvement with over the counter medications or other home remedies.  Denies other complaint or health concern today.   Patient Active Problem List   Diagnosis Date Noted  . Viral syndrome 04/20/2014  . Obesity (BMI 30-39.9) 03/14/2013  . RLS (restless legs syndrome) 12/13/2009  . OBSTRUCTIVE SLEEP APNEA 12/12/2009  . OTHER ANXIETY STATES 02/01/2009  . Hyperlipidemia 01/19/2009  . HYPOKALEMIA, MILD 01/19/2009  . DYSPEPSIA 09/28/2008  . INSOMNIA, CHRONIC 07/03/2008  . PLANTAR FASCIITIS, BILATERAL 06/20/2008  . Hypothyroidism 02/22/2007  . DEVIATED NASAL SEPTUM 02/22/2007  . PREGNANCY, ECTOPIC NEC W/INTRAUTERINE PRG 02/22/2007  . HEADACHE 02/22/2007  . Other Postprocedural Status 02/22/2007    Past Medical History  Diagnosis Date  . Thyroid disease   . RLS (restless legs syndrome)   . Hyperlipidemia   . Allergy   . Anemia   . Anxiety   . Blood transfusion without reported diagnosis     Past Surgical History  Procedure Laterality Date  . Tubal ligation  04/1999  . Carpal tunnel release      Bilateral  . Septoplasty  6/05  . Cryoablation  11/04  . Ectopic pregnancy surgery  1998  . Bunionectomy   06/04/2009    Right foot    History  Substance Use Topics  . Smoking status: Former Smoker -- 1.00 packs/day    Quit date: 01/14/1995  . Smokeless tobacco: Former Systems developer    Quit date: 08/26/1995     Comment: started smoking at age 12 1ppd  . Alcohol Use: 0.5 oz/week    1 drink(s) per week    Family History  Problem Relation Age of Onset  . Depression Father     Suicide  . Aneurysm Mother     Brain  . Diabetes Maternal Grandmother   . Alzheimer's disease Maternal Grandmother   . Uterine cancer Maternal Grandmother   . Hypertension Maternal Grandmother   . Osteoporosis Maternal Grandmother   . Lung cancer Maternal Grandfather     Allergies  Allergen Reactions  . Morphine     Nausea & vomiting    Medication list has been reviewed and updated.  Current Outpatient Prescriptions on File Prior to Visit  Medication Sig Dispense Refill  . desvenlafaxine (PRISTIQ) 100 MG 24 hr tablet Take 1 tablet (100 mg total) by mouth daily. 90 tablet 3  . fenofibrate 160 MG tablet TAKE ONE TABLET BY MOUTH ONCE DAILY 30 tablet 5  . levothyroxine (SYNTHROID, LEVOTHROID) 112 MCG tablet TAKE ONE TABLET BY MOUTH EVERY DAY 30 tablet 11  . loratadine (CLARITIN) 10 MG tablet Take 10 mg by mouth daily.    . Suvorexant (BELSOMRA) 20 MG TABS Take 1  tablet by mouth at bedtime. 30 tablet 5  . pramipexole (MIRAPEX) 0.25 MG tablet 1 po qhs x 2-3 nights then can increase to 2 po qhs x 2-3 nights (Patient not taking: Reported on 11/26/2014) 90 tablet 0   No current facility-administered medications on file prior to visit.    Review of Systems:  As per HPI, otherwise negative.    Physical Examination: Filed Vitals:   11/26/14 1014  BP: 120/78  Pulse: 76  Temp: 98.1 F (36.7 C)  Resp: 16   Filed Vitals:   11/26/14 1014  Height: 5\' 4"  (1.626 m)  Weight: 178 lb 12.8 oz (81.103 kg)   Body mass index is 30.68 kg/(m^2). Ideal Body Weight: Weight in (lb) to have BMI = 25: 145.3  GEN: WDWN, NAD,  Non-toxic, A & O x 3 HEENT: Atraumatic, Normocephalic. Neck supple. No masses, No LAD. Ears and Nose: No external deformity. CV: RRR, No M/G/R. No JVD. No thrill. No extra heart sounds. PULM: CTA B, no wheezes, crackles, rhonchi. No retractions. No resp. distress. No accessory muscle use. ABD: S, NT, ND, +BS. No rebound. No HSM. EXTR: No c/c/e NEURO Normal gait. PRRERLA EOMI CN 2-12 intact  Gait balance and coordination normal PSYCH: Normally interactive. Conversant. Not depressed or anxious appearing.  Calm demeanor.    Assessment and Plan: Migraine Follow up Dr Maureen Chatters Neurology [facility]  Signed,  Ellison Carwin, MD

## 2014-12-01 ENCOUNTER — Ambulatory Visit: Payer: BLUE CROSS/BLUE SHIELD | Admitting: Family Medicine

## 2014-12-05 ENCOUNTER — Encounter: Payer: Self-pay | Admitting: Family Medicine

## 2014-12-05 ENCOUNTER — Ambulatory Visit (INDEPENDENT_AMBULATORY_CARE_PROVIDER_SITE_OTHER): Payer: BLUE CROSS/BLUE SHIELD | Admitting: Family Medicine

## 2014-12-05 VITALS — BP 114/76 | HR 77 | Temp 97.0°F | Wt 182.2 lb

## 2014-12-05 DIAGNOSIS — N943 Premenstrual tension syndrome: Secondary | ICD-10-CM

## 2014-12-05 DIAGNOSIS — G43829 Menstrual migraine, not intractable, without status migrainosus: Secondary | ICD-10-CM | POA: Diagnosis not present

## 2014-12-05 MED ORDER — ZOLMITRIPTAN 5 MG PO TBDP: 5.0000 mg | ORAL_TABLET | ORAL | Status: DC | PRN

## 2014-12-05 NOTE — Progress Notes (Signed)
Patient ID: Katie [name], female    DOB: 1966-03-26  Age: 49 y.o. MRN: [id number]    Subjective:  Subjective HPI Katie [name] presents for  Headaches with period that have been worsening.    Review of Systems  Constitutional: Negative for fever, activity change, appetite change, fatigue and unexpected weight change.  HENT: Negative for congestion, ear pain, facial swelling, hearing loss, mouth sores, nosebleeds, postnasal drip, sinus pressure and sore throat.   Respiratory: Negative for cough and shortness of breath.   Cardiovascular: Negative for chest pain and palpitations.  Genitourinary: Positive for menstrual problem. Negative for urgency, decreased urine volume, vaginal discharge, vaginal pain and pelvic pain.       Spotting longer with period  Neurological: Positive for headaches.  Psychiatric/Behavioral: Negative for behavioral problems and dysphoric mood. The patient is not nervous/anxious.     History Past Medical History  Diagnosis Date  . Thyroid disease   . RLS (restless legs syndrome)   . Hyperlipidemia   . Allergy   . Anemia   . Anxiety   . Blood transfusion without reported diagnosis     She has past surgical history that includes Tubal ligation (04/1999); Carpal tunnel release; Septoplasty (6/05); Cryoablation (11/04); Ectopic pregnancy surgery (1998); and Bunionectomy (06/04/2009).   Her family history includes Alzheimer's disease in her maternal grandmother; Aneurysm in her mother; Depression in her father; Diabetes in her maternal grandmother; Hypertension in her maternal grandmother; Lung cancer in her maternal grandfather; Osteoporosis in her maternal grandmother; Uterine cancer in her maternal grandmother.She reports that she quit smoking about 19 years ago. She quit smokeless tobacco use about 19 years ago. She reports that she drinks about 0.5 oz of alcohol per week. She reports that she does not use illicit drugs.  Current Outpatient Prescriptions on File  Prior to Visit  Medication Sig Dispense Refill  . desvenlafaxine (PRISTIQ) 100 MG 24 hr tablet Take 1 tablet (100 mg total) by mouth daily. 90 tablet 3  . fenofibrate 160 MG tablet TAKE ONE TABLET BY MOUTH ONCE DAILY 30 tablet 5  . levothyroxine (SYNTHROID, LEVOTHROID) 112 MCG tablet TAKE ONE TABLET BY MOUTH EVERY DAY 30 tablet 11  . loratadine (CLARITIN) 10 MG tablet Take 10 mg by mouth daily.    . Suvorexant (BELSOMRA) 20 MG TABS Take 1 tablet by mouth at bedtime. 30 tablet 5   No current facility-administered medications on file prior to visit.     Objective:  Objective Physical Exam  Constitutional: She is oriented to person, place, and time. She appears well-developed and well-nourished. No distress.  HENT:  Right Ear: External ear normal.  Left Ear: External ear normal.  Nose: Nose normal.  Mouth/Throat: Oropharynx is clear and moist.  Eyes: EOM are normal. Pupils are equal, round, and reactive to light.  Neck: Normal range of motion. Neck supple.  Cardiovascular: Normal rate, regular rhythm and normal heart sounds.   No murmur heard. Pulmonary/Chest: Effort normal and breath sounds normal. No respiratory distress. She has no wheezes. She has no rales. She exhibits no tenderness.  Abdominal: Soft. She exhibits no distension and no mass. There is no tenderness. There is no rebound and no guarding.  Neurological: She is alert and oriented to person, place, and time.  Psychiatric: She has a normal mood and affect. Her behavior is normal. Judgment and thought content normal.   BP 114/76 mmHg  Pulse 77  Temp(Src) 97 F (36.1 C) (Oral)  Wt 182 lb 3.2 oz (82.645 kg)  SpO2 97%  LMP 11/23/2014 Wt Readings from Last 3 Encounters:  12/05/14 182 lb 3.2 oz (82.645 kg)  11/26/14 178 lb 12.8 oz (81.103 kg)  10/03/14 180 lb 3.2 oz (81.738 kg)     Lab Results  Component Value Date   WBC 5.8 04/19/2014   HGB 14.4 04/19/2014   HCT 44.1 04/19/2014   PLT 312.0 04/03/2014   GLUCOSE  97 04/19/2014   CHOL 154 10/04/2014   TRIG 78.0 10/04/2014   HDL 51.10 10/04/2014   LDLDIRECT 138.3 06/20/2008   LDLCALC 87 10/04/2014   ALT 33 10/04/2014   AST 29 10/04/2014   NA 137 04/19/2014   K 4.5 04/19/2014   CL 103 04/19/2014   CREATININE 0.93 04/19/2014   BUN 17 04/19/2014   CO2 26 04/19/2014   TSH 2.687 10/03/2014    No results found.   Assessment & Plan:  Plan I have discontinued Ms. Shonka's pramipexole and HYDROcodone-acetaminophen. I am also having her start on zolmitriptan. Additionally, I am having her maintain her desvenlafaxine, loratadine, levothyroxine, fenofibrate, and Suvorexant.  Meds ordered this encounter  Medications  . zolmitriptan (ZOMIG ZMT) 5 MG disintegrating tablet    Sig: Take 1 tablet (5 mg total) by mouth as needed for migraine.    Dispense:  10 tablet    Refill:  0    Problem List Items Addressed This Visit    None    Visit Diagnoses    Menstrual migraine without status migrainosus, not intractable    -  Primary    Relevant Medications    zolmitriptan (ZOMIG-ZMT) disintegrating tablet       Follow-up: Return if symptoms worsen or fail to improve, for prn.  Garnet Koyanagi, DO

## 2014-12-05 NOTE — Patient Instructions (Signed)

## 2014-12-05 NOTE — Progress Notes (Signed)
Pre visit review using our clinic review tool, if applicable. No additional management support is needed unless otherwise documented below in the visit note. 

## 2014-12-09 ENCOUNTER — Ambulatory Visit (INDEPENDENT_AMBULATORY_CARE_PROVIDER_SITE_OTHER): Payer: BLUE CROSS/BLUE SHIELD | Admitting: Emergency Medicine

## 2014-12-09 VITALS — BP 110/80 | HR 79 | Temp 97.9°F | Resp 16 | Ht 64.5 in | Wt 182.0 lb

## 2014-12-09 DIAGNOSIS — J208 Acute bronchitis due to other specified organisms: Secondary | ICD-10-CM | POA: Diagnosis not present

## 2014-12-09 MED ORDER — HYDROCOD POLST-CPM POLST ER 10-8 MG/5ML PO SUER: 5.0000 mL | Freq: Two times a day (BID) | ORAL | Status: DC | PRN

## 2014-12-09 MED ORDER — CLARITHROMYCIN 500 MG PO TABS: 500.0000 mg | ORAL_TABLET | Freq: Two times a day (BID) | ORAL | Status: DC

## 2014-12-09 NOTE — Progress Notes (Signed)
[facility] [address], [place] [address] [phone]  Date:  12/09/2014   Name:  Katie [name]   DOB:  [birth year]   MRN:  [id number]  PCP:  Garnet Koyanagi, DO    Chief Complaint: Shortness of Breath and Cough   History of Present Illness:  Katie [name] is a 49 y.o. very pleasant female patient who presents with the following:  Ill since Wednesday with a cough Not productive No wheezing but has transient exertional shortness of breath Says has a "rattle" No reactive airway disease No fever or chills No nasal congestion or drainage No sore throat. No nausea or vomiting.  No ill contacts No improvement with over the counter medications or other home remedies.  Denies other complaint or health concern today.   Patient Active Problem List   Diagnosis Date Noted  . Viral syndrome 04/20/2014  . Obesity (BMI 30-39.9) 03/14/2013  . RLS (restless legs syndrome) 12/13/2009  . OBSTRUCTIVE SLEEP APNEA 12/12/2009  . OTHER ANXIETY STATES 02/01/2009  . Hyperlipidemia 01/19/2009  . HYPOKALEMIA, MILD 01/19/2009  . DYSPEPSIA 09/28/2008  . INSOMNIA, CHRONIC 07/03/2008  . PLANTAR FASCIITIS, BILATERAL 06/20/2008  . Hypothyroidism 02/22/2007  . DEVIATED NASAL SEPTUM 02/22/2007  . PREGNANCY, ECTOPIC NEC W/INTRAUTERINE PRG 02/22/2007  . HEADACHE 02/22/2007  . Other postprocedural status(V45.89) 02/22/2007    Past Medical History  Diagnosis Date  . Thyroid disease   . RLS (restless legs syndrome)   . Hyperlipidemia   . Allergy   . Anemia   . Anxiety   . Blood transfusion without reported diagnosis     Past Surgical History  Procedure Laterality Date  . Tubal ligation  04/1999  . Carpal tunnel release      Bilateral  . Septoplasty  6/05  . Cryoablation  11/04  . Ectopic pregnancy surgery  1998  . Bunionectomy  06/04/2009    Right foot    History  Substance Use Topics  . Smoking status: Former Smoker -- 1.00 packs/day    Quit date: 01/14/1995  .  Smokeless tobacco: Former Systems developer    Quit date: 08/26/1995     Comment: started smoking at age 67 1ppd  . Alcohol Use: 0.5 oz/week    1 drink(s) per week    Family History  Problem Relation Age of Onset  . Depression Father     Suicide  . Aneurysm Mother     Brain  . Diabetes Maternal Grandmother   . Alzheimer's disease Maternal Grandmother   . Uterine cancer Maternal Grandmother   . Hypertension Maternal Grandmother   . Osteoporosis Maternal Grandmother   . Lung cancer Maternal Grandfather     Allergies  Allergen Reactions  . Morphine     Nausea & vomiting    Medication list has been reviewed and updated.  Current Outpatient Prescriptions on File Prior to Visit  Medication Sig Dispense Refill  . desvenlafaxine (PRISTIQ) 100 MG 24 hr tablet Take 1 tablet (100 mg total) by mouth daily. 90 tablet 3  . fenofibrate 160 MG tablet TAKE ONE TABLET BY MOUTH ONCE DAILY 30 tablet 5  . levothyroxine (SYNTHROID, LEVOTHROID) 112 MCG tablet TAKE ONE TABLET BY MOUTH EVERY DAY 30 tablet 11  . loratadine (CLARITIN) 10 MG tablet Take 10 mg by mouth daily.    . Suvorexant (BELSOMRA) 20 MG TABS Take 1 tablet by mouth at bedtime. 30 tablet 5  . zolmitriptan (ZOMIG ZMT) 5 MG disintegrating tablet Take 1 tablet (5 mg total)  by mouth as needed for migraine. (Patient not taking: Reported on 12/09/2014) 10 tablet 0   No current facility-administered medications on file prior to visit.    Review of Systems:  As per HPI, otherwise negative.    Physical Examination: Filed Vitals:   12/09/14 1047  BP: 110/80  Pulse: 79  Temp: 97.9 F (36.6 C)  Resp: 16   Filed Vitals:   12/09/14 1047  Height: 5' 4.5" (1.638 m)  Weight: 182 lb (82.555 kg)   Body mass index is 30.77 kg/(m^2). Ideal Body Weight: Weight in (lb) to have BMI = 25: 147.6  GEN: obese, NAD, Non-toxic, A & O x 3 HEENT: Atraumatic, Normocephalic. Neck supple. No masses, No LAD. Ears and Nose: No external deformity. CV: RRR, No  M/G/R. No JVD. No thrill. No extra heart sounds. PULM: CTA B, no wheezes, crackles, rhonchi. No retractions. No resp. distress. No accessory muscle use. ABD: S, NT, ND, +BS. No rebound. No HSM. EXTR: No c/c/e NEURO Normal gait.  PSYCH: Normally interactive. Conversant. Not depressed or anxious appearing.  Calm demeanor.    Assessment and Plan: Bronchitis biaxin tussionex   Signed,  Ellison Carwin, MD

## 2015-02-11 ENCOUNTER — Ambulatory Visit (INDEPENDENT_AMBULATORY_CARE_PROVIDER_SITE_OTHER): Payer: BLUE CROSS/BLUE SHIELD | Admitting: Emergency Medicine

## 2015-02-11 VITALS — BP 128/80 | HR 81 | Temp 98.1°F | Resp 16 | Ht 63.0 in | Wt 185.0 lb

## 2015-02-11 DIAGNOSIS — H6983 Other specified disorders of Eustachian tube, bilateral: Secondary | ICD-10-CM | POA: Diagnosis not present

## 2015-02-11 DIAGNOSIS — J0140 Acute pansinusitis, unspecified: Secondary | ICD-10-CM

## 2015-02-11 DIAGNOSIS — H6993 Unspecified Eustachian tube disorder, bilateral: Secondary | ICD-10-CM

## 2015-02-11 MED ORDER — PSEUDOEPHEDRINE-GUAIFENESIN ER 60-600 MG PO TB12: 1.0000 | ORAL_TABLET | Freq: Two times a day (BID) | ORAL | Status: DC

## 2015-02-11 MED ORDER — AMOXICILLIN-POT CLAVULANATE 875-125 MG PO TABS: 1.0000 | ORAL_TABLET | Freq: Two times a day (BID) | ORAL | Status: DC

## 2015-02-11 NOTE — Patient Instructions (Signed)
Barotitis Media Barotitis media is inflammation of your middle ear. This occurs when the auditory tube (eustachian tube) leading from the back of your nose (nasopharynx) to your eardrum is blocked. This blockage may result from a cold, environmental allergies, or an upper respiratory infection. Unresolved barotitis media may lead to damage or hearing loss (barotrauma), which may become permanent. HOME CARE INSTRUCTIONS   Use medicines as recommended by your health care provider. Over-the-counter medicines will help unblock the canal and can help during times of air travel.  Do not put anything into your ears to clean or unplug them. Eardrops will not be helpful.  Do not swim, dive, or fly until your health care provider says it is all right to do so. If these activities are necessary, chewing gum with frequent, forceful swallowing may help. It is also helpful to hold your nose and gently blow to pop your ears for equalizing pressure changes. This forces air into the eustachian tube.  Only take over-the-counter or prescription medicines for pain, discomfort, or fever as directed by your health care provider.  A decongestant may be helpful in decongesting the middle ear and make pressure equalization easier. SEEK MEDICAL CARE IF:  You experience a serious form of dizziness in which you feel as if the room is spinning and you feel nauseated (vertigo).  Your symptoms only involve one ear. SEEK IMMEDIATE MEDICAL CARE IF:   You develop a severe headache, dizziness, or severe ear pain.  You have bloody or pus-like drainage from your ears.  You develop a fever.  Your problems do not improve or become worse. MAKE SURE YOU:   Understand these instructions.  Will watch your condition.  Will get help right away if you are not doing well or get worse. Document Released: 08/08/2000 Document Revised: 06/01/2013 Document Reviewed: 03/08/2013 Exit[employer] Patient Information 2015 [employer]. This  information is not intended to replace advice given to you by your health care provider. Make sure you discuss any questions you have with your health care provider.  

## 2015-02-11 NOTE — Addendum Note (Signed)
Addended byCandice Camp on: 02/11/2015 01:07 PM   Modules accepted: Orders

## 2015-02-11 NOTE — Progress Notes (Signed)
Subjective:  Patient ID: Katie [name], female    DOB: 1965-11-15  Age: 49 y.o. MRN: [id number]  CC: Otalgia; Headache; and Nausea   HPI Katie [name] presents  with nasal congestion postnasal drainage sore throat. She has purulent nasal can nasal discharge. She has pressure in both of years and cannot Valsalva. She has no fever chills. No nausea vomiting. No stool change. No cough . No wheezing or shortness breath. She's had no improvement with over-the-counter medication.  History Katie [name] has a past medical history of Thyroid disease; RLS (restless legs syndrome); Hyperlipidemia; Allergy; Anemia; Anxiety; and Blood transfusion without reported diagnosis.   She has past surgical history that includes Tubal ligation (04/1999); Carpal tunnel release; Septoplasty (6/05); Cryoablation (11/04); Ectopic pregnancy surgery (1998); and Bunionectomy (06/04/2009).   Her  family history includes Alzheimer's disease in her maternal grandmother; Aneurysm in her mother; Depression in her father; Diabetes in her maternal grandmother; Hypertension in her maternal grandmother; Lung cancer in her maternal grandfather; Osteoporosis in her maternal grandmother; Uterine cancer in her maternal grandmother.  She   reports that she quit smoking about 20 years ago. She quit smokeless tobacco use about 19 years ago. She reports that she drinks about 0.5 oz of alcohol per week. She reports that she does not use illicit drugs.  Outpatient Prescriptions Prior to Visit  Medication Sig Dispense Refill  . desvenlafaxine (PRISTIQ) 100 MG 24 hr tablet Take 1 tablet (100 mg total) by mouth daily. 90 tablet 3  . fenofibrate 160 MG tablet TAKE ONE TABLET BY MOUTH ONCE DAILY 30 tablet 5  . levothyroxine (SYNTHROID, LEVOTHROID) 112 MCG tablet TAKE ONE TABLET BY MOUTH EVERY DAY 30 tablet 11  . loratadine (CLARITIN) 10 MG tablet Take 10 mg by mouth daily.    . Suvorexant (BELSOMRA) 20 MG TABS Take 1 tablet by mouth at bedtime. 30  tablet 5  . zolmitriptan (ZOMIG ZMT) 5 MG disintegrating tablet Take 1 tablet (5 mg total) by mouth as needed for migraine. 10 tablet 0  . chlorpheniramine-HYDROcodone (TUSSIONEX PENNKINETIC ER) 10-8 MG/5ML SUER Take 5 mLs by mouth every 12 (twelve) hours as needed for cough. (Patient not taking: Reported on 02/11/2015) 60 mL 0  . clarithromycin (BIAXIN) 500 MG tablet Take 1 tablet (500 mg total) by mouth 2 (two) times daily. (Patient not taking: Reported on 02/11/2015) 20 tablet 0   No facility-administered medications prior to visit.    History   Social History  . Marital Status: Married    Spouse Name: N/A  . Number of Children: 2  . Years of Education: 15   Occupational History  . 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 Smoking status: Former Smoker -- 1.00 packs/day    Quit date: 01/14/1995  . Smokeless tobacco: Former Systems developer    Quit date: 08/26/1995     Comment: started smoking at age 56 1ppd  . Alcohol Use: 0.5 oz/week    1 drink(s) per week  . Drug Use: No  . Sexual Activity:    Partners: Male   Other Topics Concern  . None   Social History Narrative   Exercise-- no     Review of Systems  Constitutional: Negative for fever, chills and appetite change.  HENT: Negative for congestion, ear pain, postnasal drip, sinus pressure and sore throat.   Eyes: Negative for pain and redness.  Respiratory: Negative for cough, shortness of breath and wheezing.   Cardiovascular: Negative for leg swelling.  Gastrointestinal:  Negative for nausea, vomiting, abdominal pain, diarrhea, constipation and blood in stool.  Endocrine: Negative for polyuria.  Genitourinary: Negative for dysuria, urgency, frequency and flank pain.  Musculoskeletal: Negative for gait problem.  Skin: Negative for rash.  Neurological: Negative for weakness and headaches.  Psychiatric/Behavioral: Negative for confusion and decreased concentration. The patient is not nervous/anxious.      Objective:  BP 128/80 mmHg  Pulse 81  Temp(Src) 98.1 F (36.7 C) (Oral)  Resp 16  Ht 5\' 3"  (1.6 m)  Wt 185 lb (83.915 kg)  BMI 32.78 kg/m2  SpO2 99%  LMP 01/25/2015  Physical Exam  Constitutional: She is oriented to person, place, and time. She appears well-developed and well-nourished. No distress.  HENT:  Head: Normocephalic and atraumatic.  Right Ear: External ear normal.  Left Ear: External ear normal.  Nose: Nose normal.  Eyes: Conjunctivae and EOM are normal. Pupils are equal, round, and reactive to light. No scleral icterus.  Neck: Normal range of motion. Neck supple. No tracheal deviation present.  Cardiovascular: Normal rate, regular rhythm and normal heart sounds.   Pulmonary/Chest: Effort normal. No respiratory distress. She has no wheezes. She has no rales.  Abdominal: She exhibits no mass. There is no tenderness. There is no rebound and no guarding.  Musculoskeletal: She exhibits no edema.  Lymphadenopathy:    She has no cervical adenopathy.  Neurological: She is alert and oriented to person, place, and time. Coordination normal.  Skin: Skin is warm and dry. No rash noted.  Psychiatric: She has a normal mood and affect. Her behavior is normal.      Assessment & Plan:   Katie [name] was seen today for otalgia, headache and nausea.  Diagnoses and all orders for this visit:  Acute pansinusitis, recurrence not specified  Eustachian tube dysfunction, bilateral  Other orders -     amoxicillin-clavulanate (AUGMENTIN) 875-125 MG per tablet; Take 1 tablet by mouth 2 (two) times daily. -     pseudoephedrine-guaifenesin (MUCINEX D) 60-600 MG per tablet; Take 1 tablet by mouth every 12 (twelve) hours.   I am having Katie [name] start on amoxicillin-clavulanate and pseudoephedrine-guaifenesin. I am also having her maintain her desvenlafaxine, loratadine, levothyroxine, fenofibrate, Suvorexant, zolmitriptan, chlorpheniramine-HYDROcodone, and clarithromycin.  Meds  ordered this encounter  Medications  . amoxicillin-clavulanate (AUGMENTIN) 875-125 MG per tablet    Sig: Take 1 tablet by mouth 2 (two) times daily.    Dispense:  20 tablet    Refill:  0  . pseudoephedrine-guaifenesin (MUCINEX D) 60-600 MG per tablet    Sig: Take 1 tablet by mouth every 12 (twelve) hours.    Dispense:  18 tablet    Refill:  0    Appropriate red flag conditions were discussed with the patient as well as actions that should be taken.  Patient expressed his understanding.  Follow-up: Return if symptoms worsen or fail to improve.  Roselee Culver, MD

## 2015-02-22 ENCOUNTER — Encounter: Payer: Self-pay | Admitting: Family Medicine

## 2015-02-22 DIAGNOSIS — F32A Depression, unspecified: Secondary | ICD-10-CM

## 2015-02-22 DIAGNOSIS — F329 Major depressive disorder, single episode, unspecified: Secondary | ICD-10-CM

## 2015-02-22 MED ORDER — DESVENLAFAXINE SUCCINATE ER 100 MG PO TB24: 100.0000 mg | ORAL_TABLET | Freq: Every day | ORAL | Status: DC

## 2015-02-27 IMAGING — CR DG HAND COMPLETE 3+V*R*
3 series · 3 of 3 positions shown · non-contrast
Comparison: None.

CLINICAL DATA: Joint pain multiple sites

RIGHT HAND - COMPLETE 3+ VIEW

[x hand pa right]
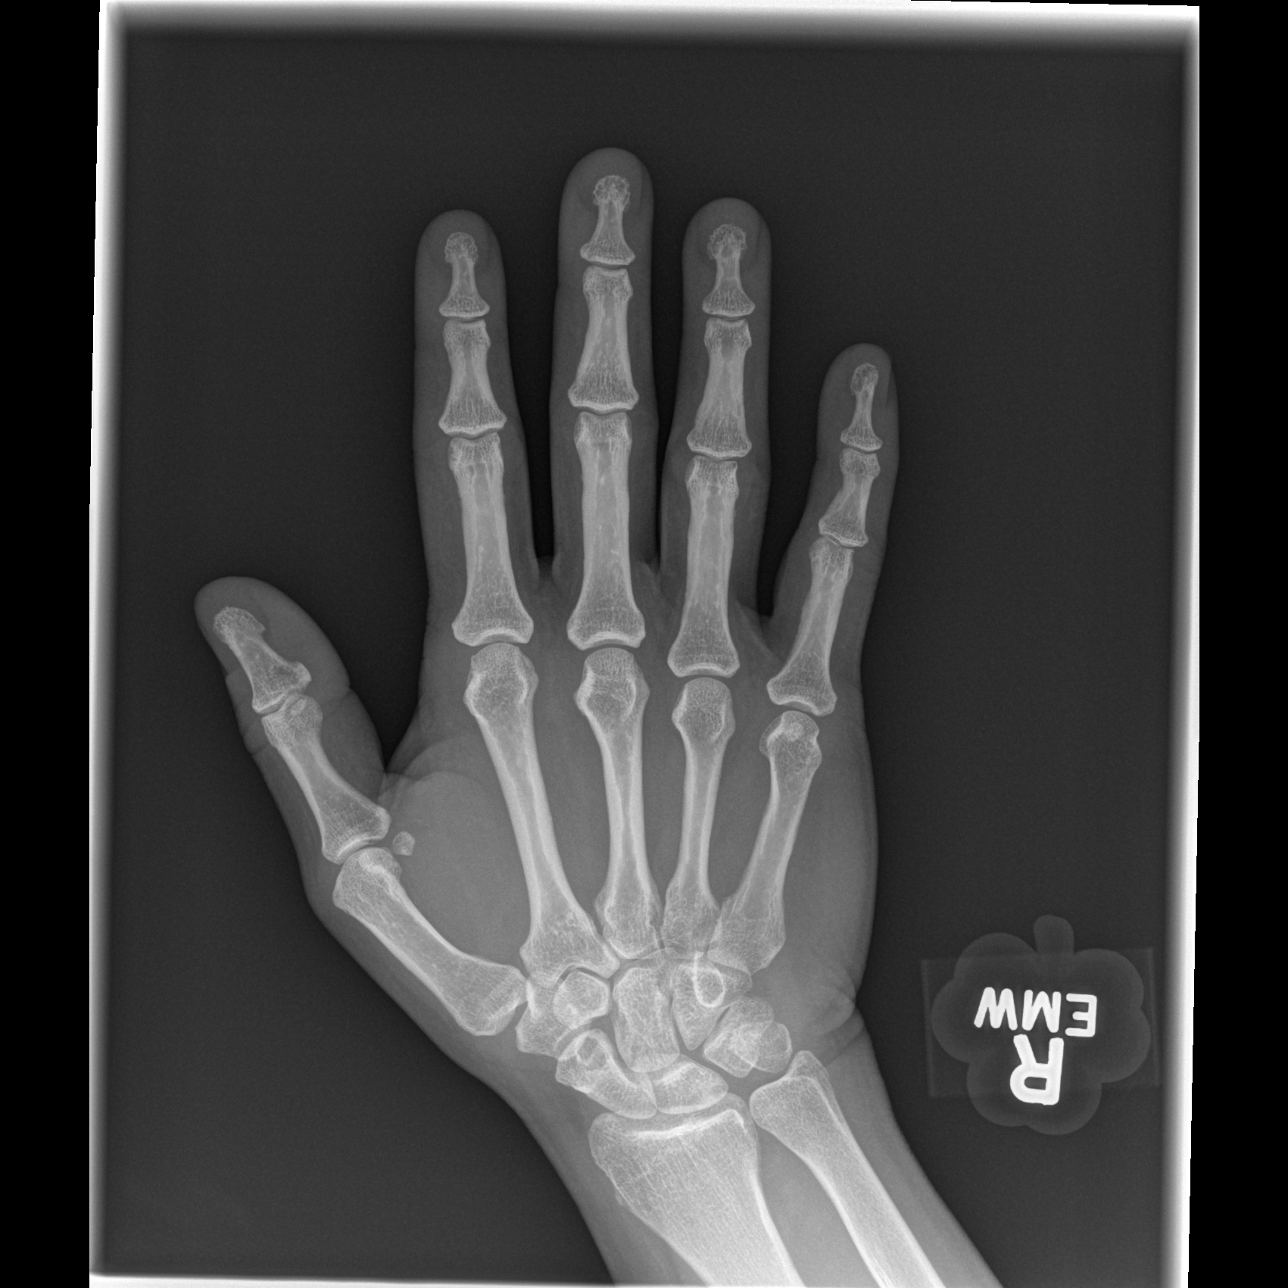

[x hand oblique right]
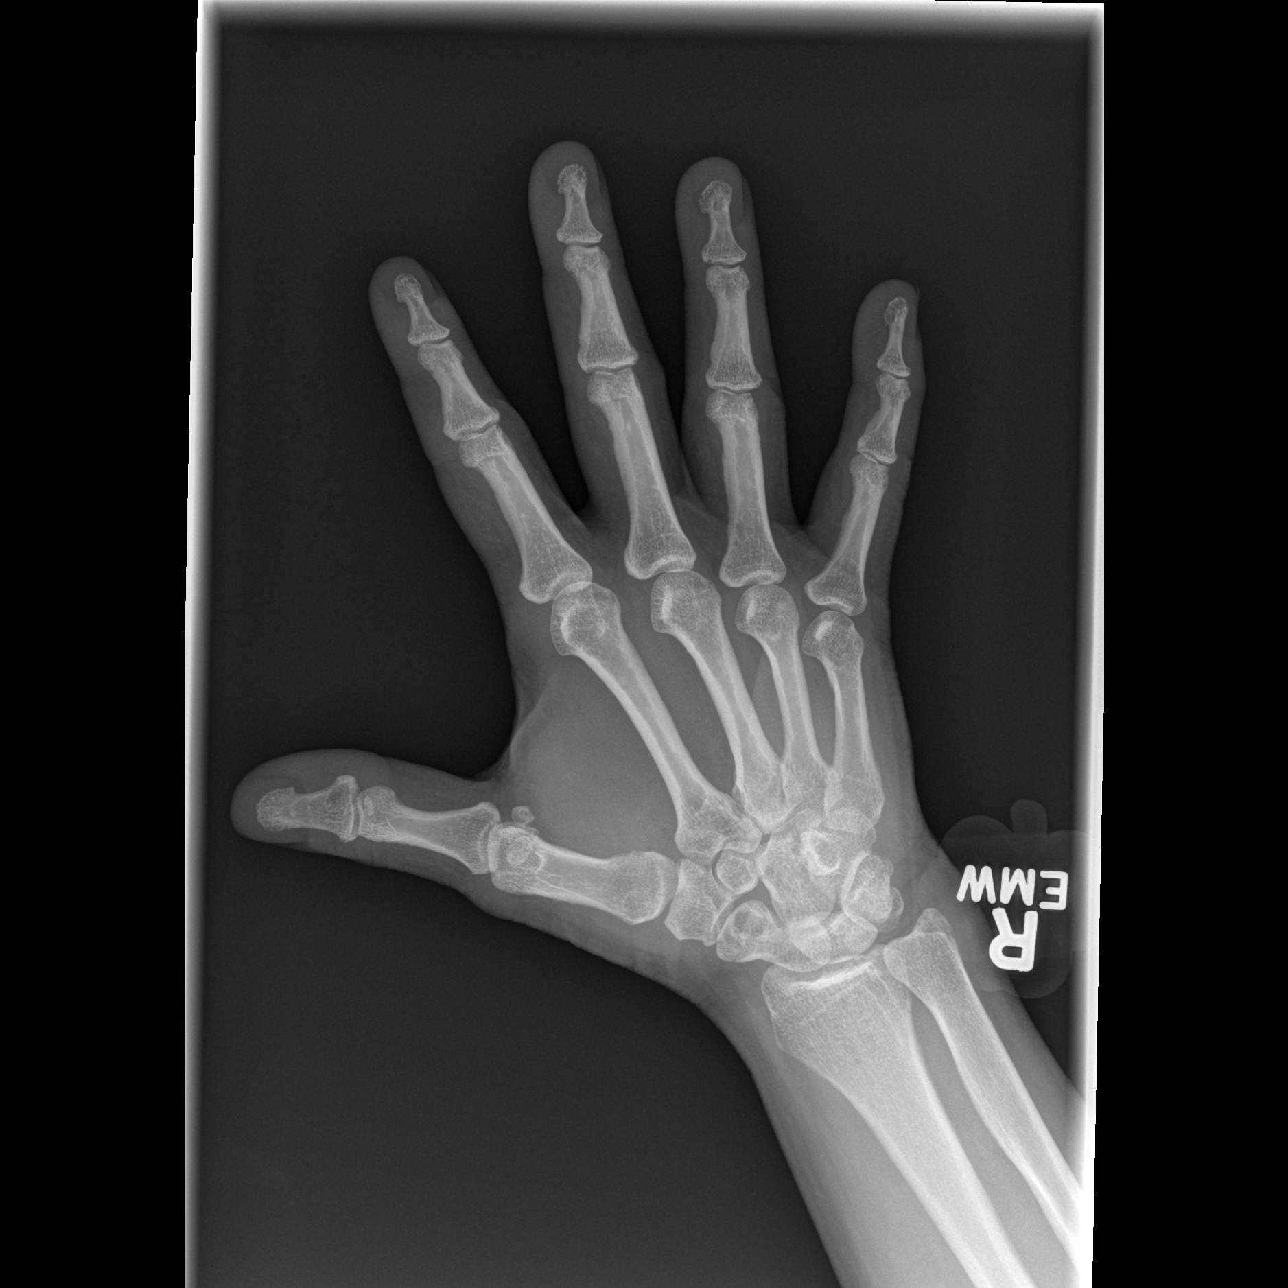

[x hand lat right]
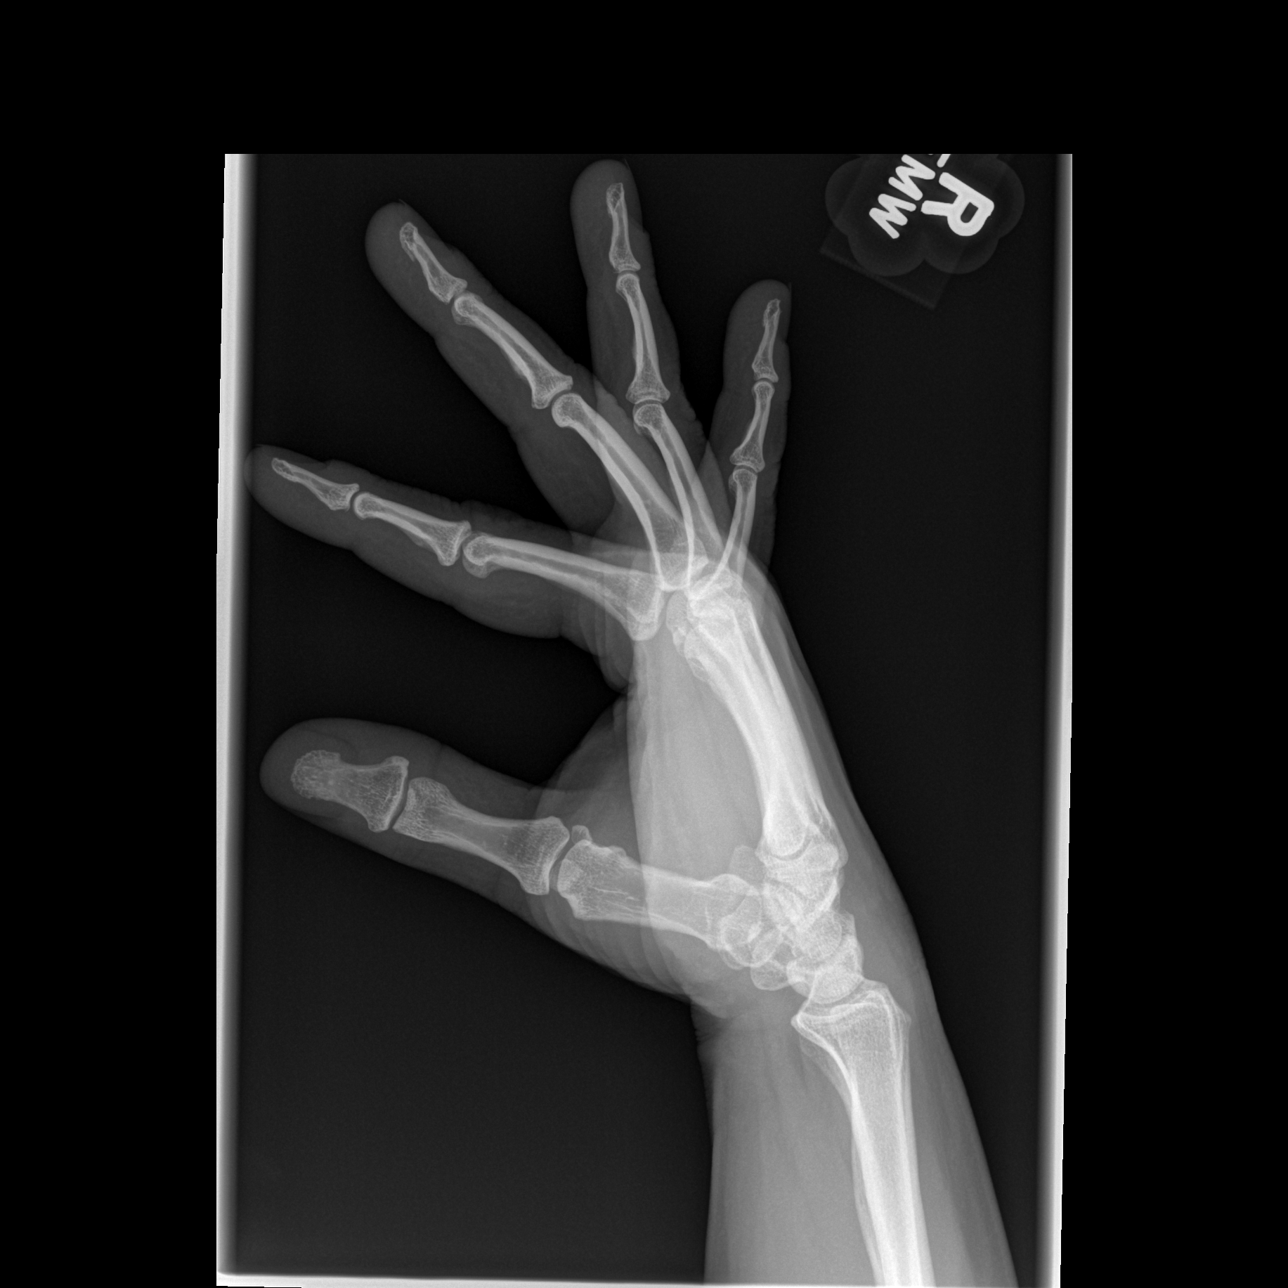

[3 of 3 positions shown; findings below may reference images not displayed]

FINDINGS: There is no evidence of fracture or dislocation.  There
is no evidence of arthropathy or other focal bone abnormality.
Soft tissues are unremarkable.
IMPRESSION: Negative.

## 2015-04-10 ENCOUNTER — Ambulatory Visit (INDEPENDENT_AMBULATORY_CARE_PROVIDER_SITE_OTHER): Payer: BLUE CROSS/BLUE SHIELD | Admitting: Family Medicine

## 2015-04-10 ENCOUNTER — Encounter: Payer: Self-pay | Admitting: Family Medicine

## 2015-04-10 VITALS — BP 108/74 | HR 72 | Temp 98.7°F | Ht 63.0 in | Wt 189.6 lb

## 2015-04-10 DIAGNOSIS — G43909 Migraine, unspecified, not intractable, without status migrainosus: Secondary | ICD-10-CM | POA: Insufficient documentation

## 2015-04-10 DIAGNOSIS — G43009 Migraine without aura, not intractable, without status migrainosus: Secondary | ICD-10-CM

## 2015-04-10 DIAGNOSIS — N951 Menopausal and female climacteric states: Secondary | ICD-10-CM

## 2015-04-10 DIAGNOSIS — R232 Flushing: Secondary | ICD-10-CM

## 2015-04-10 DIAGNOSIS — E785 Hyperlipidemia, unspecified: Secondary | ICD-10-CM

## 2015-04-10 DIAGNOSIS — G4700 Insomnia, unspecified: Secondary | ICD-10-CM

## 2015-04-10 DIAGNOSIS — E039 Hypothyroidism, unspecified: Secondary | ICD-10-CM

## 2015-04-10 MED ORDER — TRAMADOL HCL 50 MG PO TABS: 50.0000 mg | ORAL_TABLET | Freq: Three times a day (TID) | ORAL | Status: DC | PRN

## 2015-04-10 NOTE — Assessment & Plan Note (Addendum)
con't synthroid Lab Results  Component Value Date   TSH 2.687 10/03/2014

## 2015-04-10 NOTE — Assessment & Plan Note (Signed)
zomig no longer working Only occuring with period--- pt with family hx of uterine cancer so she is reluctant to use hormones for headache or hot flashes With refer her back to Dr Toney Rakes for help with hot flashes Ultram given to help in meantime

## 2015-04-10 NOTE — Assessment & Plan Note (Signed)
con't lipitor and fenofibrate

## 2015-04-10 NOTE — Progress Notes (Signed)
Patient ID: Katie [name], female    DOB: [birth year]  Age: 49 y.o. MRN: [id number]    Subjective:  Subjective HPI Katie [name] presents for c/o migraines with period and hot flashes.  The zomig is not working.  This last migraine lasted a week..  No aura.  No otc meds work.    Review of Systems  Constitutional: Negative for diaphoresis, appetite change, fatigue and unexpected weight change.  Eyes: Negative for pain, redness and visual disturbance.  Respiratory: Negative for cough, chest tightness, shortness of breath and wheezing.   Cardiovascular: Negative for chest pain, palpitations and leg swelling.  Endocrine: Negative for cold intolerance, heat intolerance, polydipsia, polyphagia and polyuria.  Genitourinary: Negative for dysuria, frequency and difficulty urinating.  Neurological: Positive for headaches. Negative for dizziness, light-headedness and numbness.    History Past Medical History  Diagnosis Date  . Thyroid disease   . RLS (restless legs syndrome)   . Hyperlipidemia   . Allergy   . Anemia   . Anxiety   . Blood transfusion without reported diagnosis     She has past surgical history that includes Tubal ligation (04/1999); Carpal tunnel release; Septoplasty (6/05); Cryoablation (11/04); Ectopic pregnancy surgery (1998); and Bunionectomy (06/04/2009).   Her family history includes Alzheimer's disease in her maternal grandmother; Aneurysm in her mother; Cancer in her maternal grandfather, maternal grandmother, paternal grandfather, and paternal grandmother; Depression in her father; Diabetes in her maternal grandmother; Hypertension in her maternal grandmother; Lung cancer in her maternal grandfather; Osteoporosis in her maternal grandmother; Uterine cancer in her maternal grandmother.She reports that she quit smoking about 20 years ago. She quit smokeless tobacco use about 19 years ago. She reports that she drinks about 0.5 oz of alcohol per week. She reports that she does  not use illicit drugs.  Current Outpatient Prescriptions on File Prior to Visit  Medication Sig Dispense Refill  . desvenlafaxine (PRISTIQ) 100 MG 24 hr tablet Take 1 tablet (100 mg total) by mouth daily. 90 tablet 3  . fenofibrate 160 MG tablet TAKE ONE TABLET BY MOUTH ONCE DAILY 30 tablet 5  . levothyroxine (SYNTHROID, LEVOTHROID) 112 MCG tablet TAKE ONE TABLET BY MOUTH EVERY DAY 30 tablet 11  . loratadine (CLARITIN) 10 MG tablet Take 10 mg by mouth daily.    . Suvorexant (BELSOMRA) 20 MG TABS Take 1 tablet by mouth at bedtime. 30 tablet 5  . zolmitriptan (ZOMIG ZMT) 5 MG disintegrating tablet Take 1 tablet (5 mg total) by mouth as needed for migraine. 10 tablet 0   No current facility-administered medications on file prior to visit.     Objective:  Objective Physical Exam  Constitutional: She is oriented to person, place, and time. She appears well-developed and well-nourished.  HENT:  Head: Normocephalic and atraumatic.  Eyes: Conjunctivae and EOM are normal.  Neck: Normal range of motion. Neck supple. No JVD present. Carotid bruit is not present. No thyromegaly present.  Cardiovascular: Normal rate, regular rhythm and normal heart sounds.   No murmur heard. Pulmonary/Chest: Effort normal and breath sounds normal. No respiratory distress. She has no wheezes. She has no rales. She exhibits no tenderness.  Musculoskeletal: She exhibits no edema.  Neurological: She is alert and oriented to person, place, and time.  Psychiatric: She has a normal mood and affect. Her behavior is normal.   BP 108/74 mmHg  Pulse 72  Temp(Src) 98.7 F (37.1 C) (Oral)  Ht 5\' 3"  (1.6 m)  Wt 189 lb 9.6 oz (86.002 kg)  BMI 33.59 kg/m2  SpO2 98%  LMP 04/05/2015 Wt Readings from Last 3 Encounters:  04/10/15 189 lb 9.6 oz (86.002 kg)  02/11/15 185 lb (83.915 kg)  12/09/14 182 lb (82.555 kg)     Lab Results  Component Value Date   WBC 5.8 04/19/2014   HGB 14.4 04/19/2014   HCT 44.1 04/19/2014    PLT 312.0 04/03/2014   GLUCOSE 97 04/19/2014   CHOL 154 10/04/2014   TRIG 78.0 10/04/2014   HDL 51.10 10/04/2014   LDLDIRECT 138.3 06/20/2008   LDLCALC 87 10/04/2014   ALT 33 10/04/2014   AST 29 10/04/2014   NA 137 04/19/2014   K 4.5 04/19/2014   CL 103 04/19/2014   CREATININE 0.93 04/19/2014   BUN 17 04/19/2014   CO2 26 04/19/2014   TSH 2.687 10/03/2014    No results found.   Assessment & Plan:  Plan I have discontinued Ms. Gorman's chlorpheniramine-HYDROcodone, clarithromycin, amoxicillin-clavulanate, and pseudoephedrine-guaifenesin. I am also having her start on traMADol. Additionally, I am having her maintain her loratadine, levothyroxine, fenofibrate, Suvorexant, zolmitriptan, and desvenlafaxine.  Meds ordered this encounter  Medications  . traMADol (ULTRAM) 50 MG tablet    Sig: Take 1 tablet (50 mg total) by mouth every 8 (eight) hours as needed.    Dispense:  30 tablet    Refill:  0    Problem List Items Addressed This Visit    Migraine - Primary    zomig no longer working Only occuring with period--- pt with family hx of uterine cancer so she is reluctant to use hormones for headache or hot flashes With refer her back to Dr Toney Rakes for help with hot flashes Ultram given to help in meantime      Relevant Medications   traMADol (ULTRAM) 50 MG tablet   Hypothyroidism    con't synthroid Lab Results  Component Value Date   TSH 2.687 10/03/2014         Hyperlipidemia    con't lipitor and fenofibrate       Other Visit Diagnoses    Hot flashes        Perimenopausal        Relevant Orders    Ambulatory referral to Gynecology       Follow-up: Return in about 6 months (around 10/11/2015), or if symptoms worsen or fail to improve.  Garnet Koyanagi, DO

## 2015-04-10 NOTE — Patient Instructions (Signed)

## 2015-04-10 NOTE — Progress Notes (Signed)
Pre visit review using our clinic review tool, if applicable. No additional management support is needed unless otherwise documented below in the visit note. 

## 2015-04-17 ENCOUNTER — Ambulatory Visit (INDEPENDENT_AMBULATORY_CARE_PROVIDER_SITE_OTHER): Payer: BLUE CROSS/BLUE SHIELD | Admitting: Gynecology

## 2015-04-17 ENCOUNTER — Other Ambulatory Visit (HOSPITAL_COMMUNITY)
Admission: RE | Admit: 2015-04-17 | Discharge: 2015-04-17 | Disposition: A | Discharge status: Home / Self Care | Payer: BLUE CROSS/BLUE SHIELD | Source: Ambulatory Visit | Attending: Gynecology | Admitting: Gynecology

## 2015-04-17 ENCOUNTER — Encounter: Payer: Self-pay | Admitting: Gynecology

## 2015-04-17 VITALS — BP 120/78 | Ht 63.5 in | Wt 187.0 lb

## 2015-04-17 DIAGNOSIS — N951 Menopausal and female climacteric states: Secondary | ICD-10-CM

## 2015-04-17 DIAGNOSIS — E038 Other specified hypothyroidism: Secondary | ICD-10-CM | POA: Diagnosis not present

## 2015-04-17 DIAGNOSIS — N943 Premenstrual tension syndrome: Secondary | ICD-10-CM | POA: Diagnosis not present

## 2015-04-17 DIAGNOSIS — G43829 Menstrual migraine, not intractable, without status migrainosus: Secondary | ICD-10-CM

## 2015-04-17 DIAGNOSIS — Z01419 Encounter for gynecological examination (general) (routine) without abnormal findings: Secondary | ICD-10-CM | POA: Insufficient documentation

## 2015-04-17 LAB — TSH: TSH: 6.566 u[IU]/mL — ABNORMAL HIGH (ref 0.350–4.500)

## 2015-04-17 MED ORDER — ESTRADIOL 0.05 MG/24HR TD PTTW: MEDICATED_PATCH | TRANSDERMAL | Status: DC

## 2015-04-17 NOTE — Patient Instructions (Signed)

## 2015-04-17 NOTE — Addendum Note (Signed)
Addended by: Nelva Nay on: 04/17/2015 12:13 PM   Modules accepted: Orders

## 2015-04-17 NOTE — Progress Notes (Signed)
Katie [name] March 06, 1966 [id number]   History:    49 y.o.  for annual gyn exam who many years ago was a former patient. Patient had endometrial ablation as a result of menorrhagia several years ago and has had great success. She describes having a very light spotting as a form a menstrual cycle for 2-3 days a month. Her main complaint is the right before the start of her menstrual cycle she suffers from migraine headaches. She also recently had been complaining of hot flashes during several times during the day. Patient states that many years ago she had abnormal Pap smear and had colposcopy and biopsy but subsequent Pap smear were normal no treatment reported. Dr. Garnet Koyanagi has been her PCP   Past medical history,surgical history, family history and social history were all reviewed and documented in the EPIC chart.  Gynecologic History Patient's last menstrual period was 04/05/2015. Contraception: tubal ligation Last Pap: Over 2 years ago. Results were: normal Last mammogram: 2015. Results were: normal  Obstetric History OB History  Gravida Para Term Preterm AB SAB TAB Ectopic Multiple Living  4 2   1   1  2     # Outcome Date GA Lbr Len/2nd Weight Sex Delivery Anes PTL Lv  4 Gravida           3 Ectopic           2 Para           1 Para                ROS: A ROS was performed and pertinent positives and negatives are included in the history.  GENERAL: No fevers or chills. HEENT: No change in vision, no earache, sore throat or sinus congestion. NECK: No pain or stiffness. CARDIOVASCULAR: No chest pain or pressure. No palpitations. PULMONARY: No shortness of breath, cough or wheeze. GASTROINTESTINAL: No abdominal pain, nausea, vomiting or diarrhea, melena or bright red blood per rectum. GENITOURINARY: No urinary frequency, urgency, hesitancy or dysuria. MUSCULOSKELETAL: No joint or muscle pain, no back pain, no recent trauma. DERMATOLOGIC: No rash, no itching, no lesions. ENDOCRINE:  No polyuria, polydipsia, no heat or cold intolerance. No recent change in weight. HEMATOLOGICAL: No anemia or easy bruising or bleeding. NEUROLOGIC: No headache, seizures, numbness, tingling or weakness. PSYCHIATRIC: No depression, no loss of interest in normal activity or change in sleep pattern.     Exam: chaperone present  BP 120/78 mmHg  Ht 5' 3.5" (1.613 m)  Wt 187 lb (84.823 kg)  BMI 32.60 kg/m2  LMP 04/05/2015  Body mass index is 32.6 kg/(m^2).  General appearance : Well developed well nourished female. No acute distress HEENT: Eyes: no retinal hemorrhage or exudates,  Neck supple, trachea midline, no carotid bruits, no thyroidmegaly Lungs: Clear to auscultation, no rhonchi or wheezes, or rib retractions  Heart: Regular rate and rhythm, no murmurs or gallops Breast:Examined in sitting and supine position were symmetrical in appearance, no palpable masses or tenderness,  no skin retraction, no nipple inversion, no nipple discharge, no skin discoloration, no axillary or supraclavicular lymphadenopathy Abdomen: no palpable masses or tenderness, no rebound or guarding Extremities: no edema or skin discoloration or tenderness  Pelvic:  Bartholin, Urethra, Skene Glands: Within normal limits             Vagina: No gross lesions or discharge  Cervix: No gross lesions or discharge  Uterus  anteverted, normal size, shape and consistency, non-tender and mobile  Adnexa  Without masses or tenderness  Anus and perineum  normal   Rectovaginal  normal sphincter tone without palpated masses or tenderness             Hemoccult not indicated     Assessment/Plan:  49 y.o. female for annual exam who may be perimenopausal. For this reason we'll check an [place]. Patient does have history of hypothyroidism for which she is currently on medication and her last thyroid function test was 6 months ago we will check her TSH today as well. It appears that she is suffering from menstrual migraine. We  discussed starting her on a low dose estrogen transdermal patch such as Vivelle-Dot 0.05 mg to apply 2 days before the start of her menses and then removed during her menses. She will use 1 low dose patch per cycle to stabilize her estrogen level and improve her menstrual migraine. The risks benefits and pros and cons were discussed. A Pap smear was done today. Literature information on the perimenopause was provided as well today. She was reminded to schedule her overdue mammogram. We discussed importance of monthly breast exams.   Terrance Mass MD, 11:12 AM 04/17/2015

## 2015-04-18 ENCOUNTER — Encounter: Payer: Self-pay | Admitting: Family Medicine

## 2015-04-18 ENCOUNTER — Telehealth: Payer: Self-pay | Admitting: *Deleted

## 2015-04-18 LAB — FOLLICLE STIMULATING HORMONE: FSH: 5.8 m[IU]/mL

## 2015-04-18 NOTE — Telephone Encounter (Signed)
Pharmacist called stating Rx for Vivelle dot 0.05 mg #4 needs to be #8 patch because it comes in a pack of 8. Asked if okay to fill #8 box. I told pharmacist okay to fill it this way

## 2015-04-19 ENCOUNTER — Encounter: Payer: Self-pay | Admitting: Family Medicine

## 2015-04-19 ENCOUNTER — Other Ambulatory Visit: Payer: Self-pay | Admitting: Family Medicine

## 2015-04-19 DIAGNOSIS — E039 Hypothyroidism, unspecified: Secondary | ICD-10-CM

## 2015-04-19 LAB — CYTOLOGY - PAP

## 2015-04-19 MED ORDER — LEVOTHYROXINE SODIUM 125 MCG PO TABS: 125.0000 ug | ORAL_TABLET | Freq: Every day | ORAL | Status: DC

## 2015-04-25 ENCOUNTER — Encounter: Payer: Self-pay | Admitting: Gynecology

## 2015-05-01 ENCOUNTER — Ambulatory Visit (INDEPENDENT_AMBULATORY_CARE_PROVIDER_SITE_OTHER): Payer: BLUE CROSS/BLUE SHIELD | Admitting: Family Medicine

## 2015-05-01 VITALS — BP 128/80 | HR 84 | Temp 98.4°F | Resp 16 | Ht 63.5 in | Wt 182.4 lb

## 2015-05-01 DIAGNOSIS — E860 Dehydration: Secondary | ICD-10-CM

## 2015-05-01 DIAGNOSIS — J069 Acute upper respiratory infection, unspecified: Secondary | ICD-10-CM | POA: Diagnosis not present

## 2015-05-01 DIAGNOSIS — K529 Noninfective gastroenteritis and colitis, unspecified: Secondary | ICD-10-CM | POA: Diagnosis not present

## 2015-05-01 LAB — POCT CBC
Granulocyte percent: 65 %G (ref 37–80)
HCT, POC: 47.3 % (ref 37.7–47.9)
Hemoglobin: 15.3 g/dL (ref 12.2–16.2)
Lymph, poc: 2.4 (ref 0.6–3.4)
MCH, POC: 28 pg (ref 27–31.2)
MCHC: 32.3 g/dL (ref 31.8–35.4)
MCV: 86.8 fL (ref 80–97)
MID (cbc): 0.9 (ref 0–0.9)
MPV: 7.6 fL (ref 0–99.8)
POC Granulocyte: 6.1 (ref 2–6.9)
POC LYMPH PERCENT: 25.7 %L (ref 10–50)
POC MID %: 9.3 %M (ref 0–12)
Platelet Count, POC: 365 10*3/uL (ref 142–424)
RBC: 5.45 M/uL (ref 4.04–5.48)
RDW, POC: 13.2 %
WBC: 9.4 10*3/uL (ref 4.6–10.2)

## 2015-05-01 LAB — POCT UA - MICROSCOPIC ONLY
Bacteria, U Microscopic: NEGATIVE
Casts, Ur, LPF, POC: NEGATIVE
Crystals, Ur, HPF, POC: NEGATIVE
Yeast, UA: NEGATIVE

## 2015-05-01 LAB — POCT URINALYSIS DIPSTICK
Bilirubin, UA: NEGATIVE
Blood, UA: NEGATIVE
Glucose, UA: NEGATIVE
Ketones, UA: NEGATIVE
Leukocytes, UA: NEGATIVE
Nitrite, UA: NEGATIVE
Protein, UA: NEGATIVE
Spec Grav, UA: 1.02
Urobilinogen, UA: 0.2
pH, UA: 7.5

## 2015-05-01 LAB — LIPASE: Lipase: 29 U/L (ref 7–60)

## 2015-05-01 LAB — COMPREHENSIVE METABOLIC PANEL
ALT: 36 U/L — ABNORMAL HIGH (ref 6–29)
AST: 30 U/L (ref 10–35)
Albumin: 4.9 g/dL (ref 3.6–5.1)
Alkaline Phosphatase: 49 U/L (ref 33–115)
BUN: 15 mg/dL (ref 7–25)
CO2: 22 mmol/L (ref 20–31)
Calcium: 10.6 mg/dL — ABNORMAL HIGH (ref 8.6–10.2)
Chloride: 101 mmol/L (ref 98–110)
Creat: 0.93 mg/dL (ref 0.50–1.10)
Glucose, Bld: 113 mg/dL — ABNORMAL HIGH (ref 65–99)
Potassium: 5.4 mmol/L — ABNORMAL HIGH (ref 3.5–5.3)
Sodium: 136 mmol/L (ref 135–146)
Total Bilirubin: 0.5 mg/dL (ref 0.2–1.2)
Total Protein: 8 g/dL (ref 6.1–8.1)

## 2015-05-01 LAB — POCT SEDIMENTATION RATE: POCT SED RATE: 23 mm/hr — AB (ref 0–22)

## 2015-05-01 MED ORDER — PROMETHAZINE-CODEINE 6.25-10 MG/5ML PO SYRP: 10.0000 mL | ORAL_SOLUTION | Freq: Four times a day (QID) | ORAL | Status: DC | PRN

## 2015-05-01 MED ORDER — ONDANSETRON 4 MG PO TBDP
8.0000 mg | ORAL_TABLET | Freq: Once | ORAL | Status: AC
  Administered 2015-05-01: 8 mg via ORAL

## 2015-05-01 MED ORDER — ONDANSETRON 4 MG PO TBDP: 4.0000 mg | ORAL_TABLET | Freq: Three times a day (TID) | ORAL | Status: DC | PRN

## 2015-05-01 NOTE — Progress Notes (Addendum)
Subjective:  This chart was scribed for [name], by Tamsen Roers, at [facility].  This patient was seen in room 8 and the patient's care was started at 10:51 AM.     Patient ID: Katie [name], female    DOB: [birth year], 49 y.o.   MRN: [id number] Chief Complaint  Patient presents with  . Ear Pain  . Nausea  . Emesis  . Diarrhea  . Dizziness    HPI HPI Comments: Katie [name] is a 49 y.o. female who presents to the [facility] complaining of multiple symptoms including ear pain, nausea/emesis(onset today), diarrhea and dizziness onset 1 week ago.  Patients symptoms first started with rhinorrhea, sinus/ear pain and was followed by nausea (two nights ago) along with emesis (this morning- orange without blood), diarrhea (loose- medium amount, most was two days ago) .  She has tried using 1 dose of mucin ex earlier in the week but denies any relief.  Patient has associated symptoms of loss of appetite and decreased urination and feels like she has spells of fevers which she has not measured (onset two nights ago) as well as palpitations.  She denies a sore throat,chest pain tightness, or any swelling in extremities.      Patient Active Problem List   Diagnosis Date Noted  . Menstrual migraine without status migrainosus, not intractable 04/17/2015  . Migraine 04/10/2015  . Viral syndrome 04/20/2014  . Obesity (BMI 30-39.9) 03/14/2013  . RLS (restless legs syndrome) 12/13/2009  . OBSTRUCTIVE SLEEP APNEA 12/12/2009  . OTHER ANXIETY STATES 02/01/2009  . Hyperlipidemia 01/19/2009  . HYPOKALEMIA, MILD 01/19/2009  . DYSPEPSIA 09/28/2008  . INSOMNIA, CHRONIC 07/03/2008  . PLANTAR FASCIITIS, BILATERAL 06/20/2008  . Hypothyroidism 02/22/2007  . DEVIATED NASAL SEPTUM 02/22/2007  . PREGNANCY, ECTOPIC NEC W/INTRAUTERINE PRG 02/22/2007  . HEADACHE 02/22/2007  . Other postprocedural status(V45.89) 02/22/2007   Past Medical History  Diagnosis  Date  . Thyroid disease   . RLS (restless legs syndrome)   . Hyperlipidemia   . Allergy   . Anemia   . Anxiety   . Blood transfusion without reported diagnosis   . Migraine   . PVC (premature ventricular contraction)    Past Surgical History  Procedure Laterality Date  . Tubal ligation  04/1999  . Carpal tunnel release      Bilateral  . Septoplasty  6/05  . Cryoablation  11/04  . Ectopic pregnancy surgery  1998  . Bunionectomy  06/04/2009    Right foot  . Endometrial ablation     Allergies  Allergen Reactions  . Morphine     Nausea & vomiting   Prior to Admission medications   Medication Sig Start Date End Date Taking? Authorizing Provider  desvenlafaxine (PRISTIQ) 100 MG 24 hr tablet Take 1 tablet (100 mg total) by mouth daily. 02/22/15  Yes Rosalita Chessman, DO  estradiol (VIVELLE-DOT) 0.05 MG/24HR patch Apply one patch only 2-3 days before menses for menstrual migraine 04/17/15  Yes Terrance Mass, MD  fenofibrate 160 MG tablet TAKE ONE TABLET BY MOUTH ONCE DAILY 11/13/14  Yes Rosalita Chessman, DO  levothyroxine (SYNTHROID, LEVOTHROID) 125 MCG tablet Take 1 tablet (125 mcg total) by mouth daily. 04/19/15  Yes Yvonne R Lowne, DO  loratadine (CLARITIN) 10 MG tablet Take 10 mg by mouth daily.   Yes Historical Provider, MD  Suvorexant (BELSOMRA) 20 MG TABS Take 1 tablet by mouth at bedtime. 11/13/14  Yes Alferd Apa  Lowne, DO  traMADol (ULTRAM) 50 MG tablet Take 1 tablet (50 mg total) by mouth every 8 (eight) hours as needed. Patient not taking: Reported on 05/01/2015 04/10/15   Rosalita Chessman, DO   Social History   Social History  . Marital Status: Married    Spouse Name: N/A  . Number of Children: 2  . Years of Education: 15   Occupational History  . 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 Smoking status: Former Smoker -- 1.00 packs/day    Quit date: 01/14/1995  . Smokeless tobacco: Former Systems developer    Quit date: 08/26/1995     Comment: started smoking at age 46  1ppd  . Alcohol Use: 0.6 oz/week    1 Standard drinks or equivalent per week  . Drug Use: No  . Sexual Activity:    Partners: Male     Comment: 1st intercourse 49 yo-More than 5 partners-BTL   Other Topics Concern  . Not on file   Social History Narrative   Exercise-- no      Review of Systems  Constitutional: Positive for fever, chills and appetite change. Negative for diaphoresis.  HENT: Positive for congestion, ear pain, postnasal drip and rhinorrhea. Negative for sore throat.   Eyes: Negative for pain, discharge and itching.  Respiratory: Negative for cough.   Gastrointestinal: Positive for nausea, vomiting, abdominal pain and diarrhea. Negative for constipation.  Musculoskeletal: Negative for neck pain and neck stiffness.  Skin: Negative for rash and wound.  Neurological: Positive for dizziness. Negative for speech difficulty.       Objective:   Physical Exam  Constitutional: She is oriented to person, place, and time. She appears well-developed and well-nourished. No distress.  HENT:  Head: Normocephalic and atraumatic.  Nose: Nose normal.  Mouth/Throat: Oropharynx is clear and moist.   Right TM normal Left TM- injected.   Eyes: Pupils are equal, round, and reactive to light.  Cardiovascular: Normal rate, regular rhythm, S1 normal, S2 normal and normal heart sounds.  Exam reveals no friction rub.   No murmur heard. Pulmonary/Chest: Effort normal and breath sounds normal. No respiratory distress. She has no wheezes. She has no rales.  Lungs clear to auscultaoin bilaterally with good air movemnt.   Abdominal: Soft. Normal appearance and bowel sounds are normal. There is no hepatosplenomegaly. There is generalized tenderness. There is no rebound, no guarding, no tenderness at McBurney's point and negative Murphy's sign. No hernia.  Generalized tenderness without rebound or referred pain  Musculoskeletal: Normal range of motion.  Neurological: She is alert and  oriented to person, place, and time.  Skin: Skin is warm and dry.  Psychiatric: She has a normal mood and affect. Her behavior is normal.  Nursing note and vitals reviewed.   Filed Vitals:   05/01/15 0957  BP: 128/80  Pulse: 84  Temp: 98.4 F (36.9 C)  TempSrc: Oral  Resp: 16  Height: 5' 3.5" (1.613 m)  Weight: 182 lb 6.4 oz (82.736 kg)  SpO2: 99%    Results for orders placed or performed in visit on 05/01/15  POCT CBC  Result Value Ref Range   WBC 9.4 4.6 - 10.2 K/uL   Lymph, poc 2.4 0.6 - 3.4   POC LYMPH PERCENT 25.7 10 - 50 %L   MID (cbc) 0.9 0 - 0.9   POC MID % 9.3 0 - 12 %M   POC Granulocyte 6.1 2 - 6.9   Granulocyte percent 65.0 37 -  80 %G   RBC 5.45 4.04 - 5.48 M/uL   Hemoglobin 15.3 12.2 - 16.2 g/dL   HCT, POC 47.3 37.7 - 47.9 %   MCV 86.8 80 - 97 fL   MCH, POC 28.0 27 - 31.2 pg   MCHC 32.3 31.8 - 35.4 g/dL   RDW, POC 13.2 %   Platelet Count, POC 365 142 - 424 K/uL   MPV 7.6 0 - 99.8 fL  POCT UA - Microscopic Only  Result Value Ref Range   WBC, Ur, HPF, POC 0-1    RBC, urine, microscopic 0-2    Bacteria, U Microscopic neg    Mucus, UA small    Epithelial cells, urine per micros 0-4    Crystals, Ur, HPF, POC neg    Casts, Ur, LPF, POC neg    Yeast, UA neg    Amorphous moderate   POCT urinalysis dipstick  Result Value Ref Range   Color, UA yellow    Clarity, UA clear    Glucose, UA neg    Bilirubin, UA neg    Ketones, UA neg    Spec Grav, UA 1.020    Blood, UA neg    pH, UA 7.5    Protein, UA neg    Urobilinogen, UA 0.2    Nitrite, UA neg    Leukocytes, UA Negative Negative     Borderline orthostatics -  positive by pulse only      Assessment & Plan:   1. Gastroenteritis   2. Dehydration   3. Acute upper respiratory infection   sig symptomatic improvement after 2L IVF and zofran, abd exam nml on recheck.  Push fluids, antiemetics prn, reviewed bland soft diet.  Pt reassured that this is viral etiology and rtc if sxs persist after 2-3d or  worsen.   Orders Placed This Encounter  Procedures  . Comprehensive metabolic panel  . Lipase  . POCT CBC  . POCT SEDIMENTATION RATE  . POCT UA - Microscopic Only  . POCT urinalysis dipstick    Meds ordered this encounter  Medications  . ondansetron (ZOFRAN-ODT) disintegrating tablet 8 mg    Sig:   . ondansetron (ZOFRAN ODT) 4 MG disintegrating tablet    Sig: Take 1 tablet (4 mg total) by mouth every 8 (eight) hours as needed for nausea or vomiting.    Dispense:  20 tablet    Refill:  0  . promethazine-codeine (PHENERGAN WITH CODEINE) 6.25-10 MG/5ML syrup    Sig: Take 10 mLs by mouth every 6 (six) hours as needed for cough.    Dispense:  160 mL    Refill:  0   Over 40 min spent in face-to-face evaluation of and consultation with patient and coordination of care.  Over 50% of this time was spent counseling this patient.  I personally performed the services described in this documentation, which was scribed in my presence. The recorded information has been reviewed and considered, and addended by me as needed.  Delman Cheadle, MD MPH

## 2015-05-01 NOTE — Patient Instructions (Addendum)
Food Choices to Help Relieve Diarrhea When you have diarrhea, the foods you eat and your eating habits are very important. Choosing the right foods and drinks can help relieve diarrhea. Also, because diarrhea can last up to 7 days, you need to replace lost fluids and electrolytes (such as sodium, potassium, and chloride) in order to help prevent dehydration.  WHAT GENERAL GUIDELINES DO I NEED TO FOLLOW?  Slowly drink 1 cup (8 oz) of fluid for each episode of diarrhea. If you are getting enough fluid, your urine will be clear or pale yellow.  Eat starchy foods. Some good choices include white rice, white toast, pasta, low-fiber cereal, baked potatoes (without the skin), saltine crackers, and bagels.  Avoid large servings of any cooked vegetables.  Limit fruit to two servings per day. A serving is  cup or 1 small piece.  Choose foods with less than 2 g of fiber per serving.  Limit fats to less than 8 tsp (38 g) per day.  Avoid fried foods.  Eat foods that have probiotics in them. Probiotics can be found in certain dairy products.  Avoid foods and beverages that may increase the speed at which food moves through the stomach and intestines (gastrointestinal tract). Things to avoid include:  High-fiber foods, such as dried fruit, raw fruits and vegetables, nuts, seeds, and whole grain foods.  Spicy foods and high-fat foods.  Foods and beverages sweetened with high-fructose corn syrup, honey, or sugar alcohols such as xylitol, sorbitol, and mannitol. WHAT FOODS ARE RECOMMENDED? Grains White rice. White, French, or pita breads (fresh or toasted), including plain rolls, buns, or bagels. White pasta. Saltine, soda, or graham crackers. Pretzels. Low-fiber cereal. Cooked cereals made with water (such as cornmeal, farina, or cream cereals). Plain muffins. Matzo. Melba toast. Zwieback.  Vegetables Potatoes (without the skin). Strained tomato and vegetable juices. Most well-cooked and canned  vegetables without seeds. Tender lettuce. Fruits Cooked or canned applesauce, apricots, cherries, fruit cocktail, grapefruit, peaches, pears, or plums. Fresh bananas, apples without skin, cherries, grapes, cantaloupe, grapefruit, peaches, oranges, or plums.  Meat and Other Protein Products Baked or boiled chicken. Eggs. Tofu. Fish. Seafood. Smooth peanut butter. Ground or well-cooked tender beef, ham, veal, lamb, pork, or poultry.  Dairy Plain yogurt, kefir, and unsweetened liquid yogurt. Lactose-free milk, buttermilk, or soy milk. Plain hard cheese. Beverages Sport drinks. Clear broths. Diluted fruit juices (except prune). Regular, caffeine-free sodas such as ginger ale. Water. Decaffeinated teas. Oral rehydration solutions. Sugar-free beverages not sweetened with sugar alcohols. Other Bouillon, broth, or soups made from recommended foods.  The items listed above may not be a complete list of recommended foods or beverages. Contact your dietitian for more options. WHAT FOODS ARE NOT RECOMMENDED? Grains Whole grain, whole wheat, bran, or rye breads, rolls, pastas, crackers, and cereals. Wild or brown rice. Cereals that contain more than 2 g of fiber per serving. Corn tortillas or taco shells. Cooked or dry oatmeal. Granola. Popcorn. Vegetables Raw vegetables. Cabbage, broccoli, Brussels sprouts, artichokes, baked beans, beet greens, corn, kale, legumes, peas, sweet potatoes, and yams. Potato skins. Cooked spinach and cabbage. Fruits Dried fruit, including raisins and dates. Raw fruits. Stewed or dried prunes. Fresh apples with skin, apricots, mangoes, pears, raspberries, and strawberries.  Meat and Other Protein Products Chunky peanut butter. Nuts and seeds. Beans and lentils. Bacon.  Dairy High-fat cheeses. Milk, chocolate milk, and beverages made with milk, such as milk shakes. Cream. Ice cream. Sweets and Desserts Sweet rolls, doughnuts, and sweet breads. Pancakes   and waffles. Fats and  Oils Butter. Cream sauces. Margarine. Salad oils. Plain salad dressings. Olives. Avocados.  Beverages Caffeinated beverages (such as coffee, tea, soda, or energy drinks). Alcoholic beverages. Fruit juices with pulp. Prune juice. Soft drinks sweetened with high-fructose corn syrup or sugar alcohols. Other Coconut. Hot sauce. Chili powder. Mayonnaise. Gravy. Cream-based or milk-based soups.  The items listed above may not be a complete list of foods and beverages to avoid. Contact your dietitian for more information. WHAT SHOULD I DO IF I BECOME DEHYDRATED? Diarrhea can sometimes lead to dehydration. Signs of dehydration include dark urine and dry mouth and skin. If you think you are dehydrated, you should rehydrate with an oral rehydration solution. These solutions can be purchased at pharmacies, retail stores, or online.  Drink -1 cup (120-240 mL) of oral rehydration solution each time you have an episode of diarrhea. If drinking this amount makes your diarrhea worse, try drinking smaller amounts more often. For example, drink 1-3 tsp (5-15 mL) every 5-10 minutes.  A general rule for staying hydrated is to drink 1-2 L of fluid per day. Talk to your health care provider about the specific amount you should be drinking each day. Drink enough fluids to keep your urine clear or pale yellow. Document Released: 11/01/2003 Document Revised: 08/16/2013 Document Reviewed: 07/04/2013 [facility] Patient Information 2015 [place], [place]. This information is not intended to replace advice given to you by your health care provider. Make sure you discuss any questions you have with your health care provider. Dehydration, Adult Dehydration is when you lose more fluids from the body than you take in. Vital organs like the kidneys, brain, and heart cannot function without a proper amount of fluids and salt. Any loss of fluids from the body can cause dehydration.  CAUSES   Vomiting.  Diarrhea.  Excessive  sweating.  Excessive urine output.  Fever. SYMPTOMS  Mild dehydration  Thirst.  Dry lips.  Slightly dry mouth. Moderate dehydration  Very dry mouth.  Sunken eyes.  Skin does not bounce back quickly when lightly pinched and released.  Dark urine and decreased urine production.  Decreased tear production.  Headache. Severe dehydration  Very dry mouth.  Extreme thirst.  Rapid, weak pulse (more than 100 beats per minute at rest).  Cold hands and feet.  Not able to sweat in spite of heat and temperature.  Rapid breathing.  Blue lips.  Confusion and lethargy.  Difficulty being awakened.  Minimal urine production.  No tears. DIAGNOSIS  Your caregiver will diagnose dehydration based on your symptoms and your exam. Blood and urine tests will help confirm the diagnosis. The diagnostic evaluation should also identify the cause of dehydration. TREATMENT  Treatment of mild or moderate dehydration can often be done at home by increasing the amount of fluids that you drink. It is best to drink small amounts of fluid more often. Drinking too much at one time can make vomiting worse. Refer to the home care instructions below. Severe dehydration needs to be treated at the hospital where you will probably be given intravenous (IV) fluids that contain water and electrolytes. HOME CARE INSTRUCTIONS   Ask your caregiver about specific rehydration instructions.  Drink enough fluids to keep your urine clear or pale yellow.  Drink small amounts frequently if you have nausea and vomiting.  Eat as you normally do.  Avoid:  Foods or drinks high in sugar.  Carbonated drinks.  Juice.  Extremely hot or cold fluids.  Drinks with caffeine.  Fatty, greasy foods.  Alcohol.  Tobacco.  Overeating.  Gelatin desserts.  Wash your hands well to avoid spreading bacteria and viruses.  Only take over-the-counter or prescription medicines for pain, discomfort, or fever as  directed by your caregiver.  Ask your caregiver if you should continue all prescribed and over-the-counter medicines.  Keep all follow-up appointments with your caregiver. SEEK MEDICAL CARE IF:  You have abdominal pain and it increases or stays in one area (localizes).  You have a rash, stiff neck, or severe headache.  You are irritable, sleepy, or difficult to awaken.  You are weak, dizzy, or extremely thirsty. SEEK IMMEDIATE MEDICAL CARE IF:   You are unable to keep fluids down or you get worse despite treatment.  You have frequent episodes of vomiting or diarrhea.  You have blood or green matter (bile) in your vomit.  You have blood in your stool or your stool looks black and tarry.  You have not urinated in 6 to 8 hours, or you have only urinated a small amount of very dark urine.  You have a fever.  You faint. MAKE SURE YOU:   Understand these instructions.  Will watch your condition.  Will get help right away if you are not doing well or get worse. Document Released: 08/11/2005 Document Revised: 11/03/2011 Document Reviewed: 03/31/2011 [facility] Patient Information 2015 [place], [place]. This information is not intended to replace advice given to you by your health care provider. Make sure you discuss any questions you have with your health care provider.

## 2015-05-21 ENCOUNTER — Encounter: Payer: Self-pay | Admitting: Family Medicine

## 2015-05-22 MED ORDER — SUVOREXANT 20 MG PO TABS: 1.0000 | ORAL_TABLET | Freq: Every day | ORAL | Status: DC

## 2015-05-22 MED ORDER — FENOFIBRATE 160 MG PO TABS: ORAL_TABLET | ORAL | Status: DC

## 2015-05-22 NOTE — Addendum Note (Signed)
Addended by: Tasia Catchings on: 05/22/2015 09:03 AM   Modules accepted: Orders

## 2015-06-07 ENCOUNTER — Encounter: Payer: Self-pay | Admitting: Family Medicine

## 2015-06-07 ENCOUNTER — Ambulatory Visit (INDEPENDENT_AMBULATORY_CARE_PROVIDER_SITE_OTHER): Payer: BLUE CROSS/BLUE SHIELD | Admitting: Family Medicine

## 2015-06-07 VITALS — BP 116/80 | HR 89 | Temp 98.2°F | Wt 188.2 lb

## 2015-06-07 DIAGNOSIS — M5441 Lumbago with sciatica, right side: Secondary | ICD-10-CM | POA: Diagnosis not present

## 2015-06-07 MED ORDER — HYDROCODONE-ACETAMINOPHEN 5-325 MG PO TABS: 1.0000 | ORAL_TABLET | Freq: Four times a day (QID) | ORAL | Status: DC | PRN

## 2015-06-07 MED ORDER — CYCLOBENZAPRINE HCL 10 MG PO TABS: 10.0000 mg | ORAL_TABLET | Freq: Three times a day (TID) | ORAL | Status: DC | PRN

## 2015-06-07 NOTE — Progress Notes (Signed)
Pre visit review using our clinic review tool, if applicable. No additional management support is needed unless otherwise documented below in the visit note. 

## 2015-06-07 NOTE — Patient Instructions (Signed)
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
## 2015-06-19 ENCOUNTER — Telehealth: Payer: Self-pay | Admitting: Family

## 2015-06-19 DIAGNOSIS — B349 Viral infection, unspecified: Secondary | ICD-10-CM

## 2015-06-19 DIAGNOSIS — J329 Chronic sinusitis, unspecified: Secondary | ICD-10-CM

## 2015-06-19 DIAGNOSIS — B9789 Other viral agents as the cause of diseases classified elsewhere: Secondary | ICD-10-CM

## 2015-06-19 MED ORDER — FLUTICASONE PROPIONATE 50 MCG/ACT NA SUSP: 2.0000 | Freq: Every day | NASAL | Status: DC

## 2015-06-19 NOTE — Progress Notes (Signed)
  We are sorry that you are not feeling well.  Here is how we plan to help!  Based on what you have shared with me it looks like you have sinusitis.  Sinusitis is inflammation and infection in the sinus cavities of the head.  Based on your presentation I believe you most likely have Acute Viral Sinusitis. This is an infection most likely caused by a virus.  There is not specific treatment for viral sinusitis other than to help you with the symptoms until the infection runs it's course.  You may use an oral decongestant such as Mucinex D or if you have glaucoma or high blood pressure use plain Mucinex.  Saline nasal spray help and can safely be used as often as needed for congestion, I have prescribed fluticason nasal spray. Spray two sprays in each nostril twice a day to help reduce your symptoms.  Some authorities believe that zinc sprays or the use of Echinacea may shorten the course of your symptoms.   Continue using Mucinex as you indicated.   Sinus infections are not as easily transmitted as other respiratory infection, however we still recommend that you avoid close contact with loved ones, especially the very young and elderly.  Remember to wash your hands thoroughly throughout the day as this is the number one way to prevent the spread of infection!  Home Care:  Only take medications as instructed by your medical team.  Complete the entire course of an antibiotic.  Do not take these medications with alcohol.  A steam or ultrasonic humidifier can help congestion.  You can place a towel over your head and breathe in the steam from hot water coming from a faucet.  Avoid close contacts especially the very young and the elderly.  Cover your mouth when you cough or sneeze.  Always remember to wash your hands.  Get Help Right Away If:  You develop worsening fever or sinus pain.  You develop a severe head ache or visual changes.  Your symptoms persist after you have completed your  treatment plan.  Make sure you  Understand these instructions.  Will watch your condition.  Will get help right away if you are not doing well or get worse.  Your e-visit answers were reviewed by a board certified advanced clinical practitioner to complete your personal care plan.  Depending on the condition, your plan could have included both over the counter or prescription medications.  If there is a problem please reply  once you have received a response from your provider.  Your safety is important to [place].  If you have drug allergies check your prescription carefully.    You can use MyChart to ask questions about today's visit, request a non-urgent call back, or ask for a work or school excuse for 24 hours related to this e-Visit. If it has been greater than 24 hours you will need to follow up with your provider, or enter a new e-Visit to address those concerns.  You will get an e-mail in the next two days asking about your experience.  I hope that your e-visit has been valuable and will speed your recovery. Thank you for using e-visits.

## 2015-06-22 ENCOUNTER — Ambulatory Visit (INDEPENDENT_AMBULATORY_CARE_PROVIDER_SITE_OTHER): Payer: BLUE CROSS/BLUE SHIELD | Admitting: Family Medicine

## 2015-06-22 VITALS — BP 119/79 | HR 84 | Temp 98.2°F | Resp 16 | Ht 64.0 in | Wt 184.0 lb

## 2015-06-22 DIAGNOSIS — J019 Acute sinusitis, unspecified: Secondary | ICD-10-CM

## 2015-06-22 DIAGNOSIS — R197 Diarrhea, unspecified: Secondary | ICD-10-CM

## 2015-06-22 DIAGNOSIS — R509 Fever, unspecified: Secondary | ICD-10-CM

## 2015-06-22 LAB — POCT CBC
Granulocyte percent: 67.5 %G (ref 37–80)
HCT, POC: 41.4 % (ref 37.7–47.9)
Hemoglobin: 14.2 g/dL (ref 12.2–16.2)
Lymph, poc: 1.4 (ref 0.6–3.4)
MCH, POC: 29.3 pg (ref 27–31.2)
MCHC: 34.3 g/dL (ref 31.8–35.4)
MCV: 85.4 fL (ref 80–97)
MID (cbc): 0.4 (ref 0–0.9)
MPV: 6.7 fL (ref 0–99.8)
POC Granulocyte: 3.8 (ref 2–6.9)
POC LYMPH PERCENT: 25.1 %L (ref 10–50)
POC MID %: 7.4 %M (ref 0–12)
Platelet Count, POC: 308 10*3/uL (ref 142–424)
RBC: 4.85 M/uL (ref 4.04–5.48)
RDW, POC: 12.7 %
WBC: 5.6 10*3/uL (ref 4.6–10.2)

## 2015-06-22 MED ORDER — AMOXICILLIN 500 MG PO CAPS: 1000.0000 mg | ORAL_CAPSULE | Freq: Three times a day (TID) | ORAL | Status: DC

## 2015-06-22 NOTE — Progress Notes (Signed)
Subjective:    Patient ID: Katie [name], female    DOB: 02/22/1966, 49 y.o.   MRN: [id number] This chart was scribed for Katie Ray, MD by Zola Button, Medical Scribe. This patient was seen in Room 5 and the patient's care was started at 10:21 AM.    HPI HPI Comments: [name] is a 49 y.o. female with a history of hypothyroidism, hyperlipidemia who presents to the [facility] complaining of gradual onset nasal congestion that started about 10 days ago.  She had an e-visit with Ms. Webb 3 days ago for cough, rhinorrhea, sore throat, and headache. Diagnosed with possible sinusitis, viral. Advised to use Mucinex D or Mucinex, saltwater nasal spray, Flonase nasal spray, +/- zinc or echinacea. Of note, she was also seen September 6th for gastroenteritis by Dr. Brigitte Pulse. Was treated with IV fluids and Zofran for suspected viral infection at that time.  Patient notes her symptoms started with cough, rhinorrhea with green discharge, nasal congestion, sinus pressure, loss of voice, fatigue, and generalized myalgias. Her symptoms have been worsening. She tried Mucinex but without relief. She has also been using saline rinses twice a day. Patient then developed GI symptoms 2 days ago, including nausea, vomiting, and diarrhea as well as subjective fever. She has not had any episodes of vomiting since 2 days ago, but still has some nausea. She had multiple episodes of diarrhea yesterday and 4-5 episodes this morning. She has been drinking fluids. Patient denies recent antibiotics and history of C. Difficile infection. She notes that somebody at her work had C. Diff about 10 months ago and had been out of work for 5 months, but this did not spread to anybody else at work. She has been treated for sinusitis with amoxicillin in the past which has been effective.  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  PCP: Garnet Koyanagi, DO   Patient Active Problem List   Diagnosis Date Noted    . Menstrual migraine without status migrainosus, not intractable 04/17/2015  . Migraine 04/10/2015  . Viral syndrome 04/20/2014  . Obesity (BMI 30-39.9) 03/14/2013  . RLS (restless legs syndrome) 12/13/2009  . OBSTRUCTIVE SLEEP APNEA 12/12/2009  . OTHER ANXIETY STATES 02/01/2009  . Hyperlipidemia 01/19/2009  . HYPOKALEMIA, MILD 01/19/2009  . DYSPEPSIA 09/28/2008  . INSOMNIA, CHRONIC 07/03/2008  . PLANTAR FASCIITIS, BILATERAL 06/20/2008  . Hypothyroidism 02/22/2007  . DEVIATED NASAL SEPTUM 02/22/2007  . PREGNANCY, ECTOPIC NEC W/INTRAUTERINE PRG 02/22/2007  . HEADACHE 02/22/2007  . Other postprocedural status(V45.89) 02/22/2007   Past Medical History  Diagnosis Date  . Thyroid disease   . RLS (restless legs syndrome)   . Hyperlipidemia   . Allergy   . Anemia   . Anxiety   . Blood transfusion without reported diagnosis   . Migraine   . PVC (premature ventricular contraction)    Past Surgical History  Procedure Laterality Date  . Tubal ligation  04/1999  . Carpal tunnel release      Bilateral  . Septoplasty  6/05  . Cryoablation  11/04  . Ectopic pregnancy surgery  1998  . Bunionectomy  06/04/2009    Right foot  . Endometrial ablation     Allergies  Allergen Reactions  . Morphine     Nausea & vomiting   Prior to Admission medications   Medication Sig Start Date End Date Taking? Authorizing Provider  cetirizine (ZYRTEC) 10 MG tablet Take 10 mg by mouth daily.   Yes Historical Provider, MD  desvenlafaxine (PRISTIQ) 100 MG 24 hr tablet Take 1 tablet (100 mg total) by mouth daily. 02/22/15  Yes Rosalita Chessman, DO  estradiol (VIVELLE-DOT) 0.05 MG/24HR patch Apply one patch only 2-3 days before menses for menstrual migraine 04/17/15  Yes Terrance Mass, MD  fenofibrate 160 MG tablet TAKE ONE TABLET BY MOUTH ONCE DAILY 05/22/15  Yes Rosalita Chessman, DO  levothyroxine (SYNTHROID, LEVOTHROID) 125 MCG tablet Take 1 tablet (125 mcg total) by mouth daily. 04/19/15  Yes Yvonne R  Lowne, DO  ondansetron (ZOFRAN ODT) 4 MG disintegrating tablet Take 1 tablet (4 mg total) by mouth every 8 (eight) hours as needed for nausea or vomiting. 05/01/15  Yes Shawnee Knapp, MD  Suvorexant (BELSOMRA) 20 MG TABS Take 1 tablet by mouth at bedtime. 05/22/15  Yes Rosalita Chessman, DO   Social History   Social History  . Marital Status: Married    Spouse Name: N/A  . Number of Children: 2  . Years of Education: 15   Occupational History  . 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 Smoking status: Former Smoker -- 1.00 packs/day    Quit date: 01/14/1995  . Smokeless tobacco: Former Systems developer    Quit date: 08/26/1995     Comment: started smoking at age 21 1ppd  . Alcohol Use: 0.6 oz/week    1 Standard drinks or equivalent per week  . Drug Use: No  . Sexual Activity:    Partners: Male     Comment: 1st intercourse 49 yo-More than 5 partners-BTL   Other Topics Concern  . Not on file   Social History Narrative   Exercise-- no     Review of Systems  Constitutional: Positive for fever, chills and fatigue.  HENT: Positive for congestion, rhinorrhea, sinus pressure and voice change.   Respiratory: Positive for cough.   Gastrointestinal: Positive for nausea, vomiting and diarrhea.  Musculoskeletal: Positive for myalgias.       Objective:   Physical Exam  Constitutional: She is oriented to person, place, and time. She appears well-developed and well-nourished. No distress.  HENT:  Head: Normocephalic and atraumatic.  Right Ear: Hearing, tympanic membrane, external ear and ear canal normal.  Left Ear: Hearing, tympanic membrane, external ear and ear canal normal.  Mouth/Throat: Oropharynx is clear and moist. No oropharyngeal exudate.  Clear to light yellow nasal discharge on right nostril. No bleeding. Diffuse sinus tenderness.   Eyes: Conjunctivae and EOM are normal. Pupils are equal, round, and reactive to light.  Neck:  No lymphadenopathy in the neck.    Cardiovascular: Normal rate, regular rhythm, normal heart sounds and intact distal pulses.   No murmur heard. Pulmonary/Chest: Effort normal and breath sounds normal. No respiratory distress. She has no wheezes. She has no rhonchi.  Abdominal: Soft. There is no tenderness.  Hyperactive bowel sounds.  Lymphadenopathy:    She has no cervical adenopathy.  Neurological: She is alert and oriented to person, place, and time.  Skin: Skin is warm and dry. No rash noted.  Psychiatric: She has a normal mood and affect. Her behavior is normal.  Vitals reviewed.   Results for orders placed or performed in visit on 06/22/15  POCT CBC  Result Value Ref Range   WBC 5.6 4.6 - 10.2 K/uL   Lymph, poc 1.4 0.6 - 3.4   POC LYMPH PERCENT 25.1 10 - 50 %L   MID (cbc) 0.4 0 - 0.9   POC MID % 7.4  0 - 12 %M   POC Granulocyte 3.8 2 - 6.9   Granulocyte percent 67.5 37 - 80 %G   RBC 4.85 4.04 - 5.48 M/uL   Hemoglobin 14.2 12.2 - 16.2 g/dL   HCT, POC 41.4 37.7 - 47.9 %   MCV 85.4 80 - 97 fL   MCH, POC 29.3 27 - 31.2 pg   MCHC 34.3 31.8 - 35.4 g/dL   RDW, POC 12.7 %   Platelet Count, POC 308 142 - 424 K/uL   MPV 6.7 0 - 99.8 fL     Filed Vitals:   06/22/15 0945  BP: 119/79  Pulse: 84  Temp: 98.2 F (36.8 C)  Resp: 16  Height: 5\' 4"  (1.626 m)  Weight: 184 lb (83.462 kg)       Assessment & Plan:   Tamyrah Burbage is a 49 y.o. female Fever chills - Plan: POCT CBC  Diarrhea, unspecified type - Plan: Gastrointestinal Pathogen Panel PCR  Acute sinusitis, recurrence not specified, unspecified location - Plan: amoxicillin (AMOXIL) 500 MG capsule  Suspected initial viral syndrome, but persistent purulent nasal discharge and possible early secondary sickening with sinusitis. Underlying diarrhea with coworker having C diff infection earlier this year, but no personal use of abx's recently.   -stool for GI pathogen panel, especially if starting abx, but less likely C Diff.   -high dose amoxicillin for  sinusitis if not starting to improve next few days.   -symptomatic care and rtc precautions.    Meds ordered this encounter  Medications  . cetirizine (ZYRTEC) 10 MG tablet    Sig: Take 10 mg by mouth daily.  Marland Kitchen amoxicillin (AMOXIL) 500 MG capsule    Sig: Take 2 capsules (1,000 mg total) by mouth 3 (three) times daily.    Dispense:  60 capsule    Refill:  0   Patient Instructions  Blood count is normal. Possible early sinus infection, but initial symptoms appear to be due to a virus. Saline nasal spray atleast 4 times per day, over the counter mucinex or mucinex DM if needed, drink plenty of fluids.  If not starting to improve in next few days - can start amoxicillin. See information on diarrhea below - we will check stool test, but if this worsens on antibiotic, or other worsening symptoms - return for recheck here or emergency room.   Sinusitis, Adult Sinusitis is redness, soreness, and inflammation of the paranasal sinuses. Paranasal sinuses are air pockets within the bones of your face. They are located beneath your eyes, in the middle of your forehead, and above your eyes. In healthy paranasal sinuses, mucus is able to drain out, and air is able to circulate through them by way of your nose. However, when your paranasal sinuses are inflamed, mucus and air can become trapped. This can allow bacteria and other germs to grow and cause infection. Sinusitis can develop quickly and last only a short time (acute) or continue over a long period (chronic). Sinusitis that lasts for more than 12 weeks is considered chronic. CAUSES Causes of sinusitis include:  Allergies.  Structural abnormalities, such as displacement of the cartilage that separates your nostrils (deviated septum), which can decrease the air flow through your nose and sinuses and affect sinus drainage.  Functional abnormalities, such as when the small hairs (cilia) that line your sinuses and help remove mucus do not work properly  or are not present. SIGNS AND SYMPTOMS Symptoms of acute and chronic sinusitis are the same. The primary  symptoms are pain and pressure around the affected sinuses. Other symptoms include:  Upper toothache.  Earache.  Headache.  Bad breath.  Decreased sense of smell and taste.  A cough, which worsens when you are lying flat.  Fatigue.  Fever.  Thick drainage from your nose, which often is green and may contain pus (purulent).  Swelling and warmth over the affected sinuses. DIAGNOSIS Your health care provider will perform a physical exam. During your exam, your health care provider may perform any of the following to help determine if you have acute sinusitis or chronic sinusitis:  Look in your nose for signs of abnormal growths in your nostrils (nasal polyps).  Tap over the affected sinus to check for signs of infection.  View the inside of your sinuses using an imaging device that has a light attached (endoscope). If your health care provider suspects that you have chronic sinusitis, one or more of the following tests may be recommended:  Allergy tests.  Nasal culture. A sample of mucus is taken from your nose, sent to a lab, and screened for bacteria.  Nasal cytology. A sample of mucus is taken from your nose and examined by your health care provider to determine if your sinusitis is related to an allergy. TREATMENT Most cases of acute sinusitis are related to a viral infection and will resolve on their own within 10 days. Sometimes, medicines are prescribed to help relieve symptoms of both acute and chronic sinusitis. These may include pain medicines, decongestants, nasal steroid sprays, or saline sprays. However, for sinusitis related to a bacterial infection, your health care provider will prescribe antibiotic medicines. These are medicines that will help kill the bacteria causing the infection. Rarely, sinusitis is caused by a fungal infection. In these cases, your  health care provider will prescribe antifungal medicine. For some cases of chronic sinusitis, surgery is needed. Generally, these are cases in which sinusitis recurs more than 3 times per year, despite other treatments. HOME CARE INSTRUCTIONS  Drink plenty of water. Water helps thin the mucus so your sinuses can drain more easily.  Use a humidifier.  Inhale steam 3-4 times a day (for example, sit in the bathroom with the shower running).  Apply a warm, moist washcloth to your face 3-4 times a day, or as directed by your health care provider.  Use saline nasal sprays to help moisten and clean your sinuses.  Take medicines only as directed by your health care provider.  If you were prescribed either an antibiotic or antifungal medicine, finish it all even if you start to feel better. SEEK IMMEDIATE MEDICAL CARE IF:  You have increasing pain or severe headaches.  You have nausea, vomiting, or drowsiness.  You have swelling around your face.  You have vision problems.  You have a stiff neck.  You have difficulty breathing.   This information is not intended to replace advice given to you by your health care provider. Make sure you discuss any questions you have with your health care provider.   Document Released: 08/11/2005 Document Revised: 09/01/2014 Document Reviewed: 08/26/2011 Elsevier Interactive Patient Education 2016 [employer].   Diarrhea Diarrhea is frequent loose and watery bowel movements. It can cause you to feel weak and dehydrated. Dehydration can cause you to become tired and thirsty, have a dry mouth, and have decreased urination that often is dark yellow. Diarrhea is a sign of another problem, most often an infection that will not last long. In most cases, diarrhea typically lasts  2-3 days. However, it can last longer if it is a sign of something more serious. It is important to treat your diarrhea as directed by your caregiver to lessen or prevent future  episodes of diarrhea. CAUSES  Some common causes include:  Gastrointestinal infections caused by viruses, bacteria, or parasites.  Food poisoning or food allergies.  Certain medicines, such as antibiotics, chemotherapy, and laxatives.  Artificial sweeteners and fructose.  Digestive disorders. HOME CARE INSTRUCTIONS  Ensure adequate fluid intake (hydration): Have 1 cup (8 oz) of fluid for each diarrhea episode. Avoid fluids that contain simple sugars or sports drinks, fruit juices, whole milk products, and sodas. Your urine should be clear or pale yellow if you are drinking enough fluids. Hydrate with an oral rehydration solution that you can purchase at pharmacies, retail stores, and online. You can prepare an oral rehydration solution at home by mixing the following ingredients together:   - tsp table salt.   tsp baking soda.   tsp salt substitute containing potassium chloride.  1  tablespoons sugar.  1 L (34 oz) of water.  Certain foods and beverages may increase the speed at which food moves through the gastrointestinal (GI) tract. These foods and beverages should be avoided and include:  Caffeinated and alcoholic beverages.  High-fiber foods, such as raw fruits and vegetables, nuts, seeds, and whole grain breads and cereals.  Foods and beverages sweetened with sugar alcohols, such as xylitol, sorbitol, and mannitol.  Some foods may be well tolerated and may help thicken stool including:  Starchy foods, such as rice, toast, pasta, low-sugar cereal, oatmeal, grits, baked potatoes, crackers, and bagels.  Bananas.  Applesauce.  Add probiotic-rich foods to help increase healthy bacteria in the GI tract, such as yogurt and fermented milk products.  Wash your hands well after each diarrhea episode.  Only take over-the-counter or prescription medicines as directed by your caregiver.  Take a warm bath to relieve any burning or pain from frequent diarrhea episodes. SEEK  IMMEDIATE MEDICAL CARE IF:   You are unable to keep fluids down.  You have persistent vomiting.  You have blood in your stool, or your stools are black and tarry.  You do not urinate in 6-8 hours, or there is only a small amount of very dark urine.  You have abdominal pain that increases or localizes.  You have weakness, dizziness, confusion, or light-headedness.  You have a severe headache.  Your diarrhea gets worse or does not get better.  You have a fever or persistent symptoms for more than 2-3 days.  You have a fever and your symptoms suddenly get worse. MAKE SURE YOU:   Understand these instructions.  Will watch your condition.  Will get help right away if you are not doing well or get worse.   This information is not intended to replace advice given to you by your health care provider. Make sure you discuss any questions you have with your health care provider.   Document Released: 08/01/2002 Document Revised: 09/01/2014 Document Reviewed: 04/18/2012 Elsevier Interactive Patient Education [employer].     I personally performed the services described in this documentation, which was scribed in my presence. The recorded information has been reviewed and considered, and addended by me as needed.    By signing my name below, I, Zola Button, attest that this documentation has been prepared under the direction and in the presence of Katie Ray, MD.  Electronically Signed: Zola Button, Medical Scribe. 06/22/2015. 10:21 AM.

## 2015-06-22 NOTE — Patient Instructions (Signed)
Blood count is normal. Possible early sinus infection, but initial symptoms appear to be due to a virus. Saline nasal spray atleast 4 times per day, over the counter mucinex or mucinex DM if needed, drink plenty of fluids.  If not starting to improve in next few days - can start amoxicillin. See information on diarrhea below - we will check stool test, but if this worsens on antibiotic, or other worsening symptoms - return for recheck here or emergency room.   Sinusitis, Adult Sinusitis is redness, soreness, and inflammation of the paranasal sinuses. Paranasal sinuses are air pockets within the bones of your face. They are located beneath your eyes, in the middle of your forehead, and above your eyes. In healthy paranasal sinuses, mucus is able to drain out, and air is able to circulate through them by way of your nose. However, when your paranasal sinuses are inflamed, mucus and air can become trapped. This can allow bacteria and other germs to grow and cause infection. Sinusitis can develop quickly and last only a short time (acute) or continue over a long period (chronic). Sinusitis that lasts for more than 12 weeks is considered chronic. CAUSES Causes of sinusitis include:  Allergies.  Structural abnormalities, such as displacement of the cartilage that separates your nostrils (deviated septum), which can decrease the air flow through your nose and sinuses and affect sinus drainage.  Functional abnormalities, such as when the small hairs (cilia) that line your sinuses and help remove mucus do not work properly or are not present. SIGNS AND SYMPTOMS Symptoms of acute and chronic sinusitis are the same. The primary symptoms are pain and pressure around the affected sinuses. Other symptoms include:  Upper toothache.  Earache.  Headache.  Bad breath.  Decreased sense of smell and taste.  A cough, which worsens when you are lying flat.  Fatigue.  Fever.  Thick drainage from your nose,  which often is green and may contain pus (purulent).  Swelling and warmth over the affected sinuses. DIAGNOSIS Your health care provider will perform a physical exam. During your exam, your health care provider may perform any of the following to help determine if you have acute sinusitis or chronic sinusitis:  Look in your nose for signs of abnormal growths in your nostrils (nasal polyps).  Tap over the affected sinus to check for signs of infection.  View the inside of your sinuses using an imaging device that has a light attached (endoscope). If your health care provider suspects that you have chronic sinusitis, one or more of the following tests may be recommended:  Allergy tests.  Nasal culture. A sample of mucus is taken from your nose, sent to a lab, and screened for bacteria.  Nasal cytology. A sample of mucus is taken from your nose and examined by your health care provider to determine if your sinusitis is related to an allergy. TREATMENT Most cases of acute sinusitis are related to a viral infection and will resolve on their own within 10 days. Sometimes, medicines are prescribed to help relieve symptoms of both acute and chronic sinusitis. These may include pain medicines, decongestants, nasal steroid sprays, or saline sprays. However, for sinusitis related to a bacterial infection, your health care provider will prescribe antibiotic medicines. These are medicines that will help kill the bacteria causing the infection. Rarely, sinusitis is caused by a fungal infection. In these cases, your health care provider will prescribe antifungal medicine. For some cases of chronic sinusitis, surgery is needed. Generally, these  are cases in which sinusitis recurs more than 3 times per year, despite other treatments. HOME CARE INSTRUCTIONS  Drink plenty of water. Water helps thin the mucus so your sinuses can drain more easily.  Use a humidifier.  Inhale steam 3-4 times a day (for  example, sit in the bathroom with the shower running).  Apply a warm, moist washcloth to your face 3-4 times a day, or as directed by your health care provider.  Use saline nasal sprays to help moisten and clean your sinuses.  Take medicines only as directed by your health care provider.  If you were prescribed either an antibiotic or antifungal medicine, finish it all even if you start to feel better. SEEK IMMEDIATE MEDICAL CARE IF:  You have increasing pain or severe headaches.  You have nausea, vomiting, or drowsiness.  You have swelling around your face.  You have vision problems.  You have a stiff neck.  You have difficulty breathing.   This information is not intended to replace advice given to you by your health care provider. Make sure you discuss any questions you have with your health care provider.   Document Released: 08/11/2005 Document Revised: 09/01/2014 Document Reviewed: 08/26/2011 Elsevier Interactive Patient Education 2016 [employer].   Diarrhea Diarrhea is frequent loose and watery bowel movements. It can cause you to feel weak and dehydrated. Dehydration can cause you to become tired and thirsty, have a dry mouth, and have decreased urination that often is dark yellow. Diarrhea is a sign of another problem, most often an infection that will not last long. In most cases, diarrhea typically lasts 2-3 days. However, it can last longer if it is a sign of something more serious. It is important to treat your diarrhea as directed by your caregiver to lessen or prevent future episodes of diarrhea. CAUSES  Some common causes include:  Gastrointestinal infections caused by viruses, bacteria, or parasites.  Food poisoning or food allergies.  Certain medicines, such as antibiotics, chemotherapy, and laxatives.  Artificial sweeteners and fructose.  Digestive disorders. HOME CARE INSTRUCTIONS  Ensure adequate fluid intake (hydration): Have 1 cup (8 oz) of fluid  for each diarrhea episode. Avoid fluids that contain simple sugars or sports drinks, fruit juices, whole milk products, and sodas. Your urine should be clear or pale yellow if you are drinking enough fluids. Hydrate with an oral rehydration solution that you can purchase at pharmacies, retail stores, and online. You can prepare an oral rehydration solution at home by mixing the following ingredients together:   - tsp table salt.   tsp baking soda.   tsp salt substitute containing potassium chloride.  1  tablespoons sugar.  1 L (34 oz) of water.  Certain foods and beverages may increase the speed at which food moves through the gastrointestinal (GI) tract. These foods and beverages should be avoided and include:  Caffeinated and alcoholic beverages.  High-fiber foods, such as raw fruits and vegetables, nuts, seeds, and whole grain breads and cereals.  Foods and beverages sweetened with sugar alcohols, such as xylitol, sorbitol, and mannitol.  Some foods may be well tolerated and may help thicken stool including:  Starchy foods, such as rice, toast, pasta, low-sugar cereal, oatmeal, grits, baked potatoes, crackers, and bagels.  Bananas.  Applesauce.  Add probiotic-rich foods to help increase healthy bacteria in the GI tract, such as yogurt and fermented milk products.  Wash your hands well after each diarrhea episode.  Only take over-the-counter or prescription medicines as directed  by your caregiver.  Take a warm bath to relieve any burning or pain from frequent diarrhea episodes. SEEK IMMEDIATE MEDICAL CARE IF:   You are unable to keep fluids down.  You have persistent vomiting.  You have blood in your stool, or your stools are black and tarry.  You do not urinate in 6-8 hours, or there is only a small amount of very dark urine.  You have abdominal pain that increases or localizes.  You have weakness, dizziness, confusion, or light-headedness.  You have a severe  headache.  Your diarrhea gets worse or does not get better.  You have a fever or persistent symptoms for more than 2-3 days.  You have a fever and your symptoms suddenly get worse. MAKE SURE YOU:   Understand these instructions.  Will watch your condition.  Will get help right away if you are not doing well or get worse.   This information is not intended to replace advice given to you by your health care provider. Make sure you discuss any questions you have with your health care provider.   Document Released: 08/01/2002 Document Revised: 09/01/2014 Document Reviewed: 04/18/2012 Elsevier Interactive Patient Education [employer].

## 2015-06-26 ENCOUNTER — Other Ambulatory Visit: Payer: Self-pay | Admitting: Family Medicine

## 2015-06-26 ENCOUNTER — Ambulatory Visit (INDEPENDENT_AMBULATORY_CARE_PROVIDER_SITE_OTHER): Payer: BLUE CROSS/BLUE SHIELD | Admitting: Family Medicine

## 2015-06-26 ENCOUNTER — Ambulatory Visit (HOSPITAL_BASED_OUTPATIENT_CLINIC_OR_DEPARTMENT_OTHER)
Admission: RE | Admit: 2015-06-26 | Discharge: 2015-06-26 | Disposition: A | Discharge status: Home / Self Care | Payer: BLUE CROSS/BLUE SHIELD | Source: Ambulatory Visit | Attending: Family Medicine | Admitting: Family Medicine

## 2015-06-26 ENCOUNTER — Encounter: Payer: Self-pay | Admitting: Family Medicine

## 2015-06-26 VITALS — BP 130/76 | HR 76 | Temp 98.4°F | Wt 188.2 lb

## 2015-06-26 DIAGNOSIS — M5410 Radiculopathy, site unspecified: Secondary | ICD-10-CM | POA: Diagnosis not present

## 2015-06-26 DIAGNOSIS — M5416 Radiculopathy, lumbar region: Secondary | ICD-10-CM | POA: Diagnosis present

## 2015-06-26 DIAGNOSIS — M47896 Other spondylosis, lumbar region: Secondary | ICD-10-CM | POA: Insufficient documentation

## 2015-06-26 MED ORDER — KETOROLAC TROMETHAMINE 60 MG/2ML IM SOLN
60.0000 mg | Freq: Once | INTRAMUSCULAR | Status: AC
  Administered 2015-06-26: 60 mg via INTRAMUSCULAR

## 2015-06-26 MED ORDER — CYCLOBENZAPRINE HCL 10 MG PO TABS: 10.0000 mg | ORAL_TABLET | Freq: Three times a day (TID) | ORAL | Status: DC | PRN

## 2015-06-26 MED ORDER — HYDROCODONE-ACETAMINOPHEN 5-325 MG PO TABS: 1.0000 | ORAL_TABLET | Freq: Four times a day (QID) | ORAL | Status: DC | PRN

## 2015-06-26 NOTE — Patient Instructions (Signed)

## 2015-06-26 NOTE — Addendum Note (Signed)
Addended by: Rosalita Chessman on: 06/26/2015 09:51 AM   Modules accepted: Orders

## 2015-06-26 NOTE — Progress Notes (Signed)
Pre visit review using our clinic review tool, if applicable. No additional management support is needed unless otherwise documented below in the visit note. 

## 2015-06-26 NOTE — Addendum Note (Signed)
Addended by: Bunnie Domino on: 06/26/2015 09:34 AM   Modules accepted: Orders

## 2015-06-26 NOTE — Telephone Encounter (Signed)
Rx sent in today.    KP

## 2015-06-26 NOTE — Progress Notes (Signed)
Subjective:    Katie [name] is a 49 y.o. female who presents for follow up of low back problems. Current symptoms include: pain in R hip and low back (aching and burning in character; 5/10 in severity), paresthesias in R leg to foot and weakness in r leg. Symptoms have worsened from the previous visit. Exacerbating factors identified by the patient are bending backwards, bending forwards, bending sideways, increased intrathoracic pressure (cough, sneeze, etc.), sitting, standing and walking.  The following portions of the patient's history were reviewed and updated as appropriate:  She  has a past medical history of Thyroid disease; RLS (restless legs syndrome); Hyperlipidemia; Allergy; Anemia; Anxiety; Blood transfusion without reported diagnosis; Migraine; and PVC (premature ventricular contraction). She  does not have any pertinent problems on file. She  has past surgical history that includes Tubal ligation (04/1999); Carpal tunnel release; Septoplasty (6/05); Cryoablation (11/04); Ectopic pregnancy surgery (1998); Bunionectomy (06/04/2009); and Endometrial ablation. Her family history includes Alzheimer's disease in her maternal grandmother; Aneurysm in her mother; Cancer in her maternal grandfather, maternal grandmother, paternal grandfather, and paternal grandmother; Depression in her father; Diabetes in her maternal grandmother; Hypertension in her maternal grandmother and sister; Lung cancer in her maternal grandfather; Osteoporosis in her maternal grandmother; Thyroid disease in her sister; Uterine cancer in her maternal grandmother. She  reports that she quit smoking about 20 years ago. She quit smokeless tobacco use about 19 years ago. She reports that she drinks about 0.6 oz of alcohol per week. She reports that she does not use illicit drugs. She has a current medication list which includes the following prescription(s): amoxicillin, cetirizine, desvenlafaxine, estradiol, fenofibrate,  levothyroxine, ondansetron, and suvorexant. Current Outpatient Prescriptions on File Prior to Visit  Medication Sig Dispense Refill  . amoxicillin (AMOXIL) 500 MG capsule Take 2 capsules (1,000 mg total) by mouth 3 (three) times daily. 60 capsule 0  . cetirizine (ZYRTEC) 10 MG tablet Take 10 mg by mouth daily.    Marland Kitchen desvenlafaxine (PRISTIQ) 100 MG 24 hr tablet Take 1 tablet (100 mg total) by mouth daily. 90 tablet 3  . estradiol (VIVELLE-DOT) 0.05 MG/24HR patch Apply one patch only 2-3 days before menses for menstrual migraine 4 patch 3  . fenofibrate 160 MG tablet TAKE ONE TABLET BY MOUTH ONCE DAILY 30 tablet 5  . levothyroxine (SYNTHROID, LEVOTHROID) 125 MCG tablet Take 1 tablet (125 mcg total) by mouth daily. 90 tablet 3  . ondansetron (ZOFRAN ODT) 4 MG disintegrating tablet Take 1 tablet (4 mg total) by mouth every 8 (eight) hours as needed for nausea or vomiting. 20 tablet 0  . Suvorexant (BELSOMRA) 20 MG TABS Take 1 tablet by mouth at bedtime. 30 tablet 5   No current facility-administered medications on file prior to visit.   She is allergic to morphine..    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 Proper lifting, bending technique discussed. Stretching exercises discussed. Short (  2-4 day) period of relative rest recommended until acute symptoms improve. Ice to affected area as needed for local pain  relief. Heat to affected area as needed for local pain relief. Muscle relaxants per medication orders.

## 2015-06-30 ENCOUNTER — Ambulatory Visit (HOSPITAL_BASED_OUTPATIENT_CLINIC_OR_DEPARTMENT_OTHER)
Admission: RE | Admit: 2015-06-30 | Discharge: 2015-06-30 | Disposition: A | Discharge status: Home / Self Care | Payer: BLUE CROSS/BLUE SHIELD | Source: Ambulatory Visit | Attending: Family Medicine | Admitting: Family Medicine

## 2015-06-30 DIAGNOSIS — M5136 Other intervertebral disc degeneration, lumbar region: Secondary | ICD-10-CM | POA: Diagnosis not present

## 2015-06-30 DIAGNOSIS — Q7649 Other congenital malformations of spine, not associated with scoliosis: Secondary | ICD-10-CM | POA: Insufficient documentation

## 2015-06-30 DIAGNOSIS — M4806 Spinal stenosis, lumbar region: Secondary | ICD-10-CM | POA: Diagnosis not present

## 2015-06-30 DIAGNOSIS — R531 Weakness: Secondary | ICD-10-CM | POA: Insufficient documentation

## 2015-06-30 DIAGNOSIS — M545 Low back pain: Secondary | ICD-10-CM | POA: Diagnosis present

## 2015-06-30 DIAGNOSIS — M8938 Hypertrophy of bone, other site: Secondary | ICD-10-CM | POA: Insufficient documentation

## 2015-06-30 DIAGNOSIS — M5410 Radiculopathy, site unspecified: Secondary | ICD-10-CM

## 2015-07-02 ENCOUNTER — Encounter: Payer: Self-pay | Admitting: Family Medicine

## 2015-07-02 DIAGNOSIS — M48061 Spinal stenosis, lumbar region without neurogenic claudication: Secondary | ICD-10-CM

## 2015-07-02 DIAGNOSIS — IMO0001 Reserved for inherently not codable concepts without codable children: Secondary | ICD-10-CM

## 2015-07-02 NOTE — Telephone Encounter (Signed)
The patient is asking for specific details on what the impression means. Please advise    KP

## 2015-07-02 NOTE — Telephone Encounter (Signed)
See mri 

## 2015-08-03 ENCOUNTER — Telehealth: Payer: Self-pay | Admitting: *Deleted

## 2015-08-03 MED ORDER — FLUTICASONE PROPIONATE 50 MCG/ACT NA SUSP: 2.0000 | Freq: Every day | NASAL | Status: DC

## 2015-08-03 NOTE — Telephone Encounter (Signed)
[facility] faxed a refill request for Fluticasone Propiona 82mcg/inh nose #16-use 2 sprays in each nostril daily.

## 2015-08-03 NOTE — Telephone Encounter (Signed)
Rx done. 

## 2015-08-03 NOTE — Telephone Encounter (Signed)
Okay to refill 6 

## 2015-08-14 ENCOUNTER — Telehealth: Payer: Self-pay | Admitting: *Deleted

## 2015-08-14 NOTE — Telephone Encounter (Signed)
Pt signed ROI received from [employer]. Forwarded to [place] to scan/email to medical records. JG//CMA

## 2015-09-04 ENCOUNTER — Ambulatory Visit: Payer: BLUE CROSS/BLUE SHIELD | Admitting: Gynecology

## 2015-09-13 ENCOUNTER — Ambulatory Visit: Payer: BLUE CROSS/BLUE SHIELD | Admitting: Gynecology

## 2015-09-21 ENCOUNTER — Ambulatory Visit (INDEPENDENT_AMBULATORY_CARE_PROVIDER_SITE_OTHER): Payer: BLUE CROSS/BLUE SHIELD | Admitting: Gynecology

## 2015-09-21 ENCOUNTER — Encounter: Payer: Self-pay | Admitting: Gynecology

## 2015-09-21 VITALS — BP 132/80 | Ht 64.0 in | Wt 184.0 lb

## 2015-09-21 DIAGNOSIS — N943 Premenstrual tension syndrome: Secondary | ICD-10-CM

## 2015-09-21 DIAGNOSIS — G43829 Menstrual migraine, not intractable, without status migrainosus: Secondary | ICD-10-CM | POA: Diagnosis not present

## 2015-09-21 DIAGNOSIS — R110 Nausea: Secondary | ICD-10-CM | POA: Diagnosis not present

## 2015-09-21 DIAGNOSIS — N946 Dysmenorrhea, unspecified: Secondary | ICD-10-CM

## 2015-09-21 DIAGNOSIS — N951 Menopausal and female climacteric states: Secondary | ICD-10-CM

## 2015-09-21 MED ORDER — NORETHIN-ETH ESTRAD-FE BIPHAS 1 MG-10 MCG / 10 MCG PO TABS: ORAL_TABLET | ORAL | Status: DC

## 2015-09-21 MED ORDER — ONDANSETRON 8 MG PO TBDP: 8.0000 mg | ORAL_TABLET | Freq: Three times a day (TID) | ORAL | Status: DC | PRN

## 2015-09-21 NOTE — Progress Notes (Signed)
   Patient is a 50 year old who was last seen the office in August 2016 had been summoned for menstrual migraines at which time she was started on Vivelle-Dot 0.05 mg to apply 2-3 days before the onset of her menses. She stated she used it for 2 months but has not continued she also suffers from cramping as well as nausea and some vomiting at times all this occurs during her menses wanted to see if a different treatment option was available for her.  Patient has had prior history of tubal ligation and endometrial ablation several years ago. Her cycles are very light but the above-mentioned symptoms is what causes her much discomfort. We discussed stopping the patch as she has done and start her on a 10 g low-dose oral contraceptive pill such as low low Estrin and she is to take a continuous the withdrawal only every 3 months to minimize her symptoms. I will also call and Zofran ODT 8 mg to apply sublingual every 8 hours when necessary during her cycles if she gets severely nauseated.  The risks benefits and pros and cons of low-dose oral contraceptive pill was discussed. Many years ago she had been on the oral contraceptive pills and had no problems. Patient denies any past history of any clotting disorders or any members of her family. We did discuss the potential risk of DVT and pulmonary embolism. Literature information instructions were provided. We are going to check her Zeb to see if she may be perimenopausal as well. Patient otherwise scheduled to return back to the office at this year for her annual exam or when necessary.   Rarely 90% of the time was spent in counseling and coordinate and care for this patient.

## 2015-09-22 LAB — FOLLICLE STIMULATING HORMONE: FSH: 11.7 m[IU]/mL

## 2015-11-20 ENCOUNTER — Other Ambulatory Visit: Payer: Self-pay | Admitting: Family Medicine

## 2015-11-20 NOTE — Telephone Encounter (Signed)
Attempted to call patient to schedule a fasting follow up per below. The mailbox is full and could not leave a message

## 2015-11-20 NOTE — Telephone Encounter (Signed)
Fenofibrate refill sent to pharmacy; #30 x 1 refill. Pt is due for fasting follow up with Dr Etter Sjogren. Please arrange appt with Dr Etter Sjogren as further refills may not be given until she is seen.  Thanks!

## 2015-11-21 NOTE — Telephone Encounter (Signed)
Sent mychart message

## 2015-11-27 ENCOUNTER — Other Ambulatory Visit: Payer: Self-pay

## 2015-11-27 DIAGNOSIS — G4700 Insomnia, unspecified: Secondary | ICD-10-CM

## 2015-11-27 MED ORDER — SUVOREXANT 20 MG PO TABS: 1.0000 | ORAL_TABLET | Freq: Every day | ORAL | Status: DC

## 2015-11-27 NOTE — Telephone Encounter (Signed)
Last seen 06/26/15 and filled 05/22/15 #30 with 5 refills   Please advise    KP

## 2015-12-11 ENCOUNTER — Encounter: Payer: Self-pay | Admitting: *Deleted

## 2016-01-17 ENCOUNTER — Encounter: Payer: Self-pay | Admitting: Family Medicine

## 2016-01-17 ENCOUNTER — Ambulatory Visit (INDEPENDENT_AMBULATORY_CARE_PROVIDER_SITE_OTHER): Payer: BLUE CROSS/BLUE SHIELD | Admitting: Family Medicine

## 2016-01-17 VITALS — BP 132/78 | HR 84 | Temp 98.3°F | Ht 63.0 in | Wt 183.0 lb

## 2016-01-17 DIAGNOSIS — E039 Hypothyroidism, unspecified: Secondary | ICD-10-CM | POA: Diagnosis not present

## 2016-01-17 DIAGNOSIS — E785 Hyperlipidemia, unspecified: Secondary | ICD-10-CM

## 2016-01-17 DIAGNOSIS — J302 Other seasonal allergic rhinitis: Secondary | ICD-10-CM

## 2016-01-17 DIAGNOSIS — G4700 Insomnia, unspecified: Secondary | ICD-10-CM

## 2016-01-17 DIAGNOSIS — F329 Major depressive disorder, single episode, unspecified: Secondary | ICD-10-CM

## 2016-01-17 DIAGNOSIS — F32A Depression, unspecified: Secondary | ICD-10-CM

## 2016-01-17 DIAGNOSIS — R110 Nausea: Secondary | ICD-10-CM

## 2016-01-17 LAB — TSH: TSH: 1.38 u[IU]/mL (ref 0.35–4.50)

## 2016-01-17 LAB — COMPREHENSIVE METABOLIC PANEL
ALT: 17 U/L (ref 0–35)
AST: 20 U/L (ref 0–37)
Albumin: 4.7 g/dL (ref 3.5–5.2)
Alkaline Phosphatase: 41 U/L (ref 39–117)
BUN: 15 mg/dL (ref 6–23)
CO2: 28 mEq/L (ref 19–32)
Calcium: 10.1 mg/dL (ref 8.4–10.5)
Chloride: 103 mEq/L (ref 96–112)
Creatinine, Ser: 0.81 mg/dL (ref 0.40–1.20)
GFR: 80.3 mL/min (ref >60.00)
Glucose, Bld: 96 mg/dL (ref 70–99)
Potassium: 4.8 mEq/L (ref 3.5–5.1)
Sodium: 137 mEq/L (ref 135–145)
Total Bilirubin: 0.6 mg/dL (ref 0.2–1.2)
Total Protein: 7.6 g/dL (ref 6.0–8.3)

## 2016-01-17 LAB — LIPID PANEL
Cholesterol: 211 mg/dL — ABNORMAL HIGH (ref 0–200)
HDL: 40.9 mg/dL (ref >39.00)
LDL Cholesterol: 147 mg/dL — ABNORMAL HIGH (ref 0–99)
NonHDL: 170.18
Total CHOL/HDL Ratio: 5
Triglycerides: 116 mg/dL (ref 0.0–149.0)
VLDL: 23.2 mg/dL (ref 0.0–40.0)

## 2016-01-17 MED ORDER — CETIRIZINE HCL 10 MG PO TABS: 10.0000 mg | ORAL_TABLET | Freq: Every day | ORAL | Status: AC

## 2016-01-17 MED ORDER — DESVENLAFAXINE SUCCINATE ER 100 MG PO TB24: 100.0000 mg | ORAL_TABLET | Freq: Every day | ORAL | Status: DC

## 2016-01-17 MED ORDER — ONDANSETRON 8 MG PO TBDP: 8.0000 mg | ORAL_TABLET | Freq: Three times a day (TID) | ORAL | Status: DC | PRN

## 2016-01-17 MED ORDER — LEVOTHYROXINE SODIUM 125 MCG PO TABS: 125.0000 ug | ORAL_TABLET | Freq: Every day | ORAL | Status: DC

## 2016-01-17 MED ORDER — SUVOREXANT 20 MG PO TABS: 1.0000 | ORAL_TABLET | Freq: Every day | ORAL | Status: DC

## 2016-01-17 MED ORDER — FENOFIBRATE 160 MG PO TABS: 160.0000 mg | ORAL_TABLET | Freq: Every day | ORAL | Status: DC

## 2016-01-17 NOTE — Patient Instructions (Signed)

## 2016-01-17 NOTE — Progress Notes (Signed)
Pre visit review using our clinic review tool, if applicable. No additional management support is needed unless otherwise documented below in the visit note. 

## 2016-01-17 NOTE — Progress Notes (Signed)
Patient ID: Katie [name], female    DOB: January 31, 1966  Age: 50 y.o. MRN: [id number]    Subjective:  Subjective HPI Katie [name] presents for f/u -- no complaints.  She needs no refills.    Review of Systems  Constitutional: Negative for diaphoresis, appetite change, fatigue and unexpected weight change.  Eyes: Negative for pain, redness and visual disturbance.  Respiratory: Negative for cough, chest tightness, shortness of breath and wheezing.   Cardiovascular: Negative for chest pain, palpitations and leg swelling.  Endocrine: Negative for cold intolerance, heat intolerance, polydipsia, polyphagia and polyuria.  Genitourinary: Negative for dysuria, frequency and difficulty urinating.  Neurological: Negative for dizziness, light-headedness, numbness and headaches.    History Past Medical History  Diagnosis Date  . Thyroid disease   . RLS (restless legs syndrome)   . Hyperlipidemia   . Allergy   . Anemia   . Anxiety   . Blood transfusion without reported diagnosis   . Migraine   . PVC (premature ventricular contraction)     She has past surgical history that includes Tubal ligation (04/1999); Carpal tunnel release; Septoplasty (6/05); Cryoablation (11/04); Ectopic pregnancy surgery (1998); Bunionectomy (06/04/2009); and Endometrial ablation.   Her family history includes Alzheimer's disease in her maternal grandmother; Aneurysm in her mother; Cancer in her maternal grandfather, maternal grandmother, paternal grandfather, and paternal grandmother; Depression in her father; Diabetes in her maternal grandmother; Hypertension in her maternal grandmother and sister; Lung cancer in her maternal grandfather; Osteoporosis in her maternal grandmother; Thyroid disease in her sister; Uterine cancer in her maternal grandmother.She reports that she quit smoking about 21 years ago. She quit smokeless tobacco use about 20 years ago. She reports that she drinks about 0.6 oz of alcohol per week. She  reports that she does not use illicit drugs.  Current Outpatient Prescriptions on File Prior to Visit  Medication Sig Dispense Refill  . cyclobenzaprine (FLEXERIL) 10 MG tablet Take 1 tablet (10 mg total) by mouth 3 (three) times daily as needed for muscle spasms. 30 tablet 0  . Norethindrone-Ethinyl Estradiol-Fe Biphas (LO LOESTRIN FE) 1 MG-10 MCG / 10 MCG tablet Take continuous as instructed to withdraw every three months 3 Package 4   No current facility-administered medications on file prior to visit.     Objective:  Objective Physical Exam  Constitutional: She is oriented to person, place, and time. She appears well-developed and well-nourished.  HENT:  Head: Normocephalic and atraumatic.  Eyes: Conjunctivae and EOM are normal.  Neck: Normal range of motion. Neck supple. No JVD present. Carotid bruit is not present. No thyromegaly present.  Cardiovascular: Normal rate, regular rhythm and normal heart sounds.   No murmur heard. Pulmonary/Chest: Effort normal and breath sounds normal. No respiratory distress. She has no wheezes. She has no rales. She exhibits no tenderness.  Musculoskeletal: She exhibits no edema.  Neurological: She is alert and oriented to person, place, and time.  Psychiatric: She has a normal mood and affect. Her behavior is normal. Judgment and thought content normal.  Nursing note and vitals reviewed.  BP 132/78 mmHg  Pulse 84  Temp(Src) 98.3 F (36.8 C) (Oral)  Ht '5\' 3"'  (1.6 m)  Wt 183 lb (83.008 kg)  BMI 32.43 kg/m2  SpO2 98% Wt Readings from Last 3 Encounters:  01/17/16 183 lb (83.008 kg)  09/21/15 184 lb (83.462 kg)  06/26/15 188 lb 3.2 oz (85.367 kg)     Lab Results  Component Value Date   WBC 5.6 06/22/2015   HGB  14.2 06/22/2015   HCT 41.4 06/22/2015   PLT 312.0 04/03/2014   GLUCOSE 96 01/17/2016   CHOL 211* 01/17/2016   TRIG 116.0 01/17/2016   HDL 40.90 01/17/2016   LDLDIRECT 138.3 06/20/2008   LDLCALC 147* 01/17/2016   ALT 17  01/17/2016   AST 20 01/17/2016   NA 137 01/17/2016   K 4.8 01/17/2016   CL 103 01/17/2016   CREATININE 0.81 01/17/2016   BUN 15 01/17/2016   CO2 28 01/17/2016   TSH 1.38 01/17/2016    Mr Lumbar Spine Wo Contrast  06/30/2015  CLINICAL DATA:  50 year old female with lumbar back pain radiating to the right hip and lower extremity. Bilateral lower extremity weakness. Subsequent encounter. EXAM: MRI LUMBAR SPINE WITHOUT CONTRAST TECHNIQUE: Multiplanar, multisequence MR imaging of the lumbar spine was performed. No intravenous contrast was administered. COMPARISON:  Lumbar radiographs 06/26/2015. [facility] Lumbar MRI 06/26/2005. FINDINGS: Same numbering system as on the comparison studies designating a partially sacralized L5 level and hypoplastic ribs at T12. Full size ribs at T11. Correlation with radiographs is recommended prior to any operative intervention. Chronic mild T12 superior endplate deformity is unchanged since 2006. Stable vertebral height and alignment. No marrow edema or evidence of acute osseous abnormality. Incidental benign vertebral body hemangiomas in L1 and S2. Visualized lower thoracic spinal cord is normal with conus medularis at L1. Visualized abdominal viscera and paraspinal soft tissues are within normal limits. T11-T12:  Mild facet hypertrophy.  No stenosis. T12-L1:  Negative. L1-L2:  Negative disc.  Mild facet hypertrophy.  No stenosis. L2-L3:  Negative disc.  Mild facet hypertrophy.  No stenosis. L3-L4: Interval mild disc desiccation. Minimal disc bulge. Chronic mild to moderate facet hypertrophy and mild ligament flavum hypertrophy. Facet joint fluid has decreased compared 2006. No stenosis. L4-L5: Interval disc desiccation and circumferential disc bulge. Small central annular fissure best seen on series 4, image 7. Moderate chronic facet and mild ligament flavum hypertrophy. Increased facet joint fluid on the right. Mild flattening of the ventral thecal sac, but no  significant spinal stenosis. There is mild right greater than left L4 foraminal stenosis appears increased. L5-S1: Mostly sacralized. Minimal to mild facet hypertrophy. No stenosis. IMPRESSION: 1. Transitional lumbosacral anatomy. Same numbering system today as on the comparisons. Correlation with radiographs is recommended prior to any operative intervention. 2. Dominant finding is chronic lumbar facet hypertrophy maximal at L4-L5. Mild disc degeneration also at that level since 2006 including a small annular fissure. Mild right greater than left L4 foraminal stenosis appears increased, but otherwise no spinal stenosis or convincing neural impingement. Electronically Signed   By: Genevie Ann M.D.   On: 06/30/2015 11:51     Assessment & Plan:  Plan I have changed Ms. Friel's fenofibrate and cetirizine. I am also having her maintain her cyclobenzaprine, Norethindrone-Ethinyl Estradiol-Fe Biphas, Suvorexant, ondansetron, levothyroxine, and desvenlafaxine.  Meds ordered this encounter  Medications  . Suvorexant (BELSOMRA) 20 MG TABS    Sig: Take 1 tablet by mouth at bedtime.    Dispense:  30 tablet    Refill:  5  . ondansetron (ZOFRAN ODT) 8 MG disintegrating tablet    Sig: Take 1 tablet (8 mg total) by mouth every 8 (eight) hours as needed for nausea or vomiting.    Dispense:  20 tablet    Refill:  2  . levothyroxine (SYNTHROID, LEVOTHROID) 125 MCG tablet    Sig: Take 1 tablet (125 mcg total) by mouth daily.    Dispense:  90 tablet  Refill:  3  . fenofibrate 160 MG tablet    Sig: Take 1 tablet (160 mg total) by mouth daily.    Dispense:  30 tablet    Refill:  1  . desvenlafaxine (PRISTIQ) 100 MG 24 hr tablet    Sig: Take 1 tablet (100 mg total) by mouth daily.    Dispense:  90 tablet    Refill:  3  . cetirizine (ZYRTEC) 10 MG tablet    Sig: Take 1 tablet (10 mg total) by mouth daily.    Problem List Items Addressed This Visit    Hyperlipidemia - Primary   Relevant Medications    fenofibrate 160 MG tablet   Other Relevant Orders   Comprehensive metabolic panel (Completed)   Lipid panel (Completed)   TSH (Completed)   Lipid panel   Comp Met (CMET)    Other Visit Diagnoses    Hypothyroidism, unspecified hypothyroidism type        Relevant Medications    levothyroxine (SYNTHROID, LEVOTHROID) 125 MCG tablet    Other Relevant Orders    Comprehensive metabolic panel (Completed)    Lipid panel (Completed)    TSH (Completed)    Insomnia        Relevant Medications    Suvorexant (BELSOMRA) 20 MG TABS    Depression        Relevant Medications    desvenlafaxine (PRISTIQ) 100 MG 24 hr tablet    Nausea without vomiting        Relevant Medications    ondansetron (ZOFRAN ODT) 8 MG disintegrating tablet    Seasonal allergies        Relevant Medications    cetirizine (ZYRTEC) 10 MG tablet       Follow-up: Return in about 6 months (around 07/19/2016), or if symptoms worsen or fail to improve, for annual exam, fasting.  [name], DO

## 2016-01-29 ENCOUNTER — Other Ambulatory Visit: Payer: Self-pay

## 2016-01-29 DIAGNOSIS — E785 Hyperlipidemia, unspecified: Secondary | ICD-10-CM

## 2016-01-29 MED ORDER — FENOFIBRATE 160 MG PO TABS: 160.0000 mg | ORAL_TABLET | Freq: Every day | ORAL | Status: DC

## 2016-02-15 ENCOUNTER — Ambulatory Visit: Payer: BLUE CROSS/BLUE SHIELD | Admitting: Family Medicine

## 2016-02-19 ENCOUNTER — Encounter: Payer: Self-pay | Admitting: Family Medicine

## 2016-02-19 ENCOUNTER — Ambulatory Visit (INDEPENDENT_AMBULATORY_CARE_PROVIDER_SITE_OTHER): Payer: BLUE CROSS/BLUE SHIELD | Admitting: Family Medicine

## 2016-02-19 VITALS — BP 120/76 | HR 69 | Temp 98.1°F | Wt 185.0 lb

## 2016-02-19 DIAGNOSIS — L918 Other hypertrophic disorders of the skin: Secondary | ICD-10-CM | POA: Diagnosis not present

## 2016-02-19 NOTE — Progress Notes (Signed)
Pre visit review using our clinic review tool, if applicable. No additional management support is needed unless otherwise documented below in the visit note. 

## 2016-02-19 NOTE — Patient Instructions (Signed)
Keep dry 24 h then you can bath like normal Call with any questions / concerns

## 2016-02-19 NOTE — Progress Notes (Signed)
  Skin Tag Removal Procedure Note  Pre-operative Diagnosis: Classic skin tags (acrochordon)  Post-operative Diagnosis: Classic skin tags (acrochordon)  Locations:lateral R axilla and inner L thigh   Indications: irritation, redness   Anesthesia: 1% zylocaine with epi   2 cc  without added sodium bicarbonate  Procedure Details  The risks (including bleeding and infection) and benefits of the procedure and Verbal informed consent obtained. Using sterile iris scissors, multiple skin tags were snipped off at their bases after cleansing with Betadine.  Bleeding was controlled by pressure silver nitrate stick   Findings: Pathognomonic benign lesions  not sent for pathological exam.  Condition: Stable  Complications: none.  Plan: 1. Instructed to keep the wounds dry and covered for 24-48h and clean thereafter. 2. Warning signs of infection were reviewed.   3. Recommended that the patient use OTC analgesics as needed for pain.  4. Return as needed.

## 2016-03-03 ENCOUNTER — Other Ambulatory Visit: Payer: Self-pay

## 2016-03-03 DIAGNOSIS — F32A Depression, unspecified: Secondary | ICD-10-CM

## 2016-03-03 DIAGNOSIS — F329 Major depressive disorder, single episode, unspecified: Secondary | ICD-10-CM

## 2016-03-03 MED ORDER — DESVENLAFAXINE SUCCINATE ER 100 MG PO TB24: 100.0000 mg | ORAL_TABLET | Freq: Every day | ORAL | Status: DC

## 2016-04-29 ENCOUNTER — Encounter: Payer: Self-pay | Admitting: Family Medicine

## 2016-04-29 NOTE — Telephone Encounter (Signed)
Ok to refill x 1  

## 2016-04-29 NOTE — Telephone Encounter (Signed)
Please advise      KP 

## 2016-05-02 ENCOUNTER — Telehealth: Payer: Self-pay

## 2016-05-07 NOTE — Telephone Encounter (Signed)
Received approval letter for the Belsomra 20mg  from [facility].  Effective dates of the authorization:05/02/16-08/24/2038.  Called pharmacy with the approval.   Pharmacy stated that they will wait to speak with the pt before filling the prescription.  Called and spoke with the pt and informed her of the approval, and that the pharmacy will wait to hear from her before filling the prescription.   Pt verbalized understanding and agreed.//AB/CMA

## 2016-05-21 ENCOUNTER — Encounter: Payer: Self-pay | Admitting: Family Medicine

## 2016-05-21 DIAGNOSIS — E039 Hypothyroidism, unspecified: Secondary | ICD-10-CM

## 2016-05-22 MED ORDER — LEVOTHYROXINE SODIUM 125 MCG PO TABS: 125.0000 ug | ORAL_TABLET | Freq: Every day | ORAL | 2 refills | Status: DC

## 2016-06-05 ENCOUNTER — Encounter: Payer: Self-pay | Admitting: Family Medicine

## 2016-06-05 ENCOUNTER — Other Ambulatory Visit: Payer: Self-pay

## 2016-06-05 MED ORDER — SUVOREXANT 20 MG PO TABS: 1.0000 | ORAL_TABLET | Freq: Every day | ORAL | 5 refills | Status: DC

## 2016-06-12 DIAGNOSIS — Z23 Encounter for immunization: Secondary | ICD-10-CM | POA: Diagnosis not present

## 2016-08-08 ENCOUNTER — Encounter: Payer: Self-pay | Admitting: Family Medicine

## 2016-08-08 DIAGNOSIS — E785 Hyperlipidemia, unspecified: Secondary | ICD-10-CM

## 2016-08-08 MED ORDER — FENOFIBRATE 160 MG PO TABS: 160.0000 mg | ORAL_TABLET | Freq: Every day | ORAL | 1 refills | Status: DC

## 2016-09-15 ENCOUNTER — Encounter: Payer: Self-pay | Admitting: Family Medicine

## 2016-09-15 ENCOUNTER — Ambulatory Visit (HOSPITAL_BASED_OUTPATIENT_CLINIC_OR_DEPARTMENT_OTHER)
Admission: RE | Admit: 2016-09-15 | Discharge: 2016-09-15 | Disposition: A | Discharge status: Home / Self Care | Payer: BLUE CROSS/BLUE SHIELD | Source: Ambulatory Visit | Attending: Family Medicine | Admitting: Family Medicine

## 2016-09-15 ENCOUNTER — Ambulatory Visit (INDEPENDENT_AMBULATORY_CARE_PROVIDER_SITE_OTHER): Payer: BLUE CROSS/BLUE SHIELD | Admitting: Family Medicine

## 2016-09-15 VITALS — BP 118/70 | HR 86 | Temp 98.2°F | Resp 16 | Ht 63.0 in | Wt 185.4 lb

## 2016-09-15 DIAGNOSIS — J029 Acute pharyngitis, unspecified: Secondary | ICD-10-CM

## 2016-09-15 DIAGNOSIS — M25552 Pain in left hip: Secondary | ICD-10-CM | POA: Insufficient documentation

## 2016-09-15 DIAGNOSIS — E785 Hyperlipidemia, unspecified: Secondary | ICD-10-CM

## 2016-09-15 DIAGNOSIS — J020 Streptococcal pharyngitis: Secondary | ICD-10-CM | POA: Diagnosis not present

## 2016-09-15 DIAGNOSIS — E039 Hypothyroidism, unspecified: Secondary | ICD-10-CM

## 2016-09-15 DIAGNOSIS — B001 Herpesviral vesicular dermatitis: Secondary | ICD-10-CM | POA: Diagnosis not present

## 2016-09-15 LAB — POCT RAPID STREP A (OFFICE): Rapid Strep A Screen: NEGATIVE

## 2016-09-15 MED ORDER — VALACYCLOVIR HCL 1 G PO TABS: 1000.0000 mg | ORAL_TABLET | Freq: Three times a day (TID) | ORAL | 2 refills | Status: DC

## 2016-09-15 MED ORDER — FENOFIBRATE 160 MG PO TABS: 160.0000 mg | ORAL_TABLET | Freq: Every day | ORAL | 1 refills | Status: DC

## 2016-09-15 MED ORDER — MELOXICAM 15 MG PO TABS: 15.0000 mg | ORAL_TABLET | Freq: Every day | ORAL | 0 refills | Status: DC

## 2016-09-15 NOTE — Progress Notes (Signed)
Subjective:    Patient ID: Katie [name], female    DOB: 02-06-1966, 51 y.o.   MRN: [id number]  Chief Complaint  Patient presents with  . Follow-up    HPI Patient is in today for 6 month follow up of cholesterol,and thyroid-- she also  c/o left hip pain, and sore throat. No congestion or fever,  No cough.  Symptoms started Sat evening.   Pt also c/o hip pain x few weeks.  No known injury.  Pain with sitting/ walking   Past Medical History:  Diagnosis Date  . Allergy   . Anemia   . Anxiety   . Blood transfusion without reported diagnosis   . Hyperlipidemia   . Migraine   . PVC (premature ventricular contraction)   . RLS (restless legs syndrome)   . Thyroid disease     Past Surgical History:  Procedure Laterality Date  . BUNIONECTOMY  06/04/2009   Right foot  . CARPAL TUNNEL RELEASE     Bilateral  . CRYOABLATION  11/04  . ECTOPIC PREGNANCY SURGERY  1998  . ENDOMETRIAL ABLATION    . SEPTOPLASTY  6/05  . TUBAL LIGATION  04/1999    Family History  Problem Relation Age of Onset  . Depression Father     Suicide  . Aneurysm Mother     Brain  . Diabetes Maternal Grandmother   . Alzheimer's disease Maternal Grandmother   . Uterine cancer Maternal Grandmother   . Hypertension Maternal Grandmother   . Osteoporosis Maternal Grandmother   . Cancer Maternal Grandmother     uterine  . Lung cancer Maternal Grandfather   . Cancer Maternal Grandfather     lung  . Cancer Paternal Grandmother     metatstatic-- ? primary colon  . Cancer Paternal Grandfather     skin, hepatic  . Hypertension Sister   . Thyroid disease Sister     Social History   Social History  . Marital status: Married    Spouse name: N/A  . Number of children: 2  . Years of education: 15   Occupational History  . 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 Smoking status: Former Smoker    Packs/day: 1.00    Quit date: 01/14/1995  . Smokeless tobacco: Former Systems developer    Quit date:  08/26/1995     Comment: started smoking at age 16 1ppd  . Alcohol use 0.6 oz/week    1 Standard drinks or equivalent per week  . Drug use: No  . Sexual activity: Not Currently    Partners: Male     Comment: 1st intercourse 51 yo-More than 5 partners-BTL   Other Topics Concern  . Not on file   Social History Narrative   Exercise-- no    Outpatient Medications Prior to Visit  Medication Sig Dispense Refill  . cetirizine (ZYRTEC) 10 MG tablet Take 1 tablet (10 mg total) by mouth daily.    Marland Kitchen desvenlafaxine (PRISTIQ) 100 MG 24 hr tablet Take 1 tablet (100 mg total) by mouth daily. 90 tablet 3  . levothyroxine (SYNTHROID, LEVOTHROID) 125 MCG tablet Take 1 tablet (125 mcg total) by mouth daily. 90 tablet 2  . Norethindrone-Ethinyl Estradiol-Fe Biphas (LO LOESTRIN FE) 1 MG-10 MCG / 10 MCG tablet Take continuous as instructed to withdraw every three months 3 Package 4  . ondansetron (ZOFRAN ODT) 8 MG disintegrating tablet Take 1 tablet (8 mg total) by mouth every 8 (eight) hours as needed for nausea  or vomiting. 20 tablet 2  . Suvorexant (BELSOMRA) 20 MG TABS Take 1 tablet by mouth at bedtime. 30 tablet 5  . fenofibrate 160 MG tablet Take 1 tablet (160 mg total) by mouth daily. 30 tablet 1   No facility-administered medications prior to visit.     Allergies  Allergen Reactions  . Morphine     Nausea & vomiting    Review of Systems  Constitutional: Negative for fever.  HENT: Positive for sore throat. Negative for congestion.   Eyes: Negative for blurred vision.  Respiratory: Negative for cough.   Cardiovascular: Negative for chest pain and palpitations.  Gastrointestinal: Negative for vomiting.  Musculoskeletal: Positive for back pain and joint pain.  Skin: Negative for rash.  Neurological: Negative for loss of consciousness and headaches.  Psychiatric/Behavioral: Negative.        Objective:    Physical Exam  Constitutional: She is oriented to person, place, and time. She  appears well-developed and well-nourished.  HENT:  Head: Normocephalic and atraumatic.  Mouth/Throat: Oropharyngeal exudate and posterior oropharyngeal erythema present. No posterior oropharyngeal edema.  Eyes: Conjunctivae and EOM are normal.  Neck: Normal range of motion. Neck supple. No JVD present. Carotid bruit is not present. No thyromegaly present.  Cardiovascular: Normal rate, regular rhythm and normal heart sounds.   No murmur heard. Pulmonary/Chest: Effort normal and breath sounds normal. No respiratory distress. She has no wheezes. She has no rales. She exhibits no tenderness.  Musculoskeletal: Normal range of motion. She exhibits tenderness. She exhibits no edema or deformity.  Neurological: She is alert and oriented to person, place, and time. She has normal reflexes. She displays normal reflexes. No cranial nerve deficit. She exhibits normal muscle tone. Coordination normal.  Psychiatric: She has a normal mood and affect.  Nursing note and vitals reviewed.   BP 118/70 (BP Location: Right Arm, Patient Position: Sitting, Cuff Size: Normal)   Pulse 86   Temp 98.2 F (36.8 C) (Oral)   Resp 16   Ht 5\' 3"  (1.6 m)   Wt 185 lb 6.4 oz (84.1 kg)   SpO2 97%   BMI 32.84 kg/m  Wt Readings from Last 3 Encounters:  09/15/16 185 lb 6.4 oz (84.1 kg)  02/19/16 185 lb (83.9 kg)  01/17/16 183 lb (83 kg)     Lab Results  Component Value Date   WBC 5.6 06/22/2015   HGB 14.2 06/22/2015   HCT 41.4 06/22/2015   PLT 312.0 04/03/2014   GLUCOSE 96 01/17/2016   CHOL 211 (H) 01/17/2016   TRIG 116.0 01/17/2016   HDL 40.90 01/17/2016   LDLDIRECT 138.3 06/20/2008   LDLCALC 147 (H) 01/17/2016   ALT 17 01/17/2016   AST 20 01/17/2016   NA 137 01/17/2016   K 4.8 01/17/2016   CL 103 01/17/2016   CREATININE 0.81 01/17/2016   BUN 15 01/17/2016   CO2 28 01/17/2016   TSH 1.38 01/17/2016    Lab Results  Component Value Date   TSH 1.38 01/17/2016   Lab Results  Component Value Date    WBC 5.6 06/22/2015   HGB 14.2 06/22/2015   HCT 41.4 06/22/2015   MCV 85.4 06/22/2015   PLT 312.0 04/03/2014   Lab Results  Component Value Date   NA 137 01/17/2016   K 4.8 01/17/2016   CO2 28 01/17/2016   GLUCOSE 96 01/17/2016   BUN 15 01/17/2016   CREATININE 0.81 01/17/2016   BILITOT 0.6 01/17/2016   ALKPHOS 41 01/17/2016   AST 20  01/17/2016   ALT 17 01/17/2016   PROT 7.6 01/17/2016   ALBUMIN 4.7 01/17/2016   CALCIUM 10.1 01/17/2016   GFR 80.30 01/17/2016   Lab Results  Component Value Date   CHOL 211 (H) 01/17/2016   Lab Results  Component Value Date   HDL 40.90 01/17/2016   Lab Results  Component Value Date   LDLCALC 147 (H) 01/17/2016   Lab Results  Component Value Date   TRIG 116.0 01/17/2016   Lab Results  Component Value Date   CHOLHDL 5 01/17/2016   No results found for: HGBA1C     Assessment & Plan:   Problem List Items Addressed This Visit      Unprioritized   Hyperlipidemia   Relevant Medications   fenofibrate 160 MG tablet   Other Relevant Orders   Lipid panel   Comprehensive metabolic panel   Hypothyroidism   Relevant Orders   TSH    Other Visit Diagnoses    Cold sore    -  Primary   Relevant Medications   valACYclovir (VALTREX) 1000 MG tablet   Acute pharyngitis, unspecified etiology       Relevant Orders   Culture, Group A Strep   Streptococcal sore throat       Relevant Medications   valACYclovir (VALTREX) 1000 MG tablet   Other Relevant Orders   POCT rapid strep A (Completed)   Acute pain of left hip       Relevant Medications   meloxicam (MOBIC) 15 MG tablet   Other Relevant Orders   DG HIP UNILAT WITH PELVIS 2-3 VIEWS LEFT (Completed)      I am having Ms. Lemma start on valACYclovir and meloxicam. I am also having her maintain her Norethindrone-Ethinyl Estradiol-Fe Biphas, ondansetron, cetirizine, desvenlafaxine, levothyroxine, Suvorexant, and fenofibrate.  Meds ordered this encounter  Medications  . valACYclovir  (VALTREX) 1000 MG tablet    Sig: Take 1 tablet (1,000 mg total) by mouth 3 (three) times daily.    Dispense:  30 tablet    Refill:  2  . fenofibrate 160 MG tablet    Sig: Take 1 tablet (160 mg total) by mouth daily.    Dispense:  90 tablet    Refill:  1  . meloxicam (MOBIC) 15 MG tablet    Sig: Take 1 tablet (15 mg total) by mouth daily.    Dispense:  30 tablet    Refill:  0    CMA served as scribe during this visit. History, Physical and Plan performed by medical provider. Documentation and orders reviewed and attested to.   [name], DO

## 2016-09-15 NOTE — Progress Notes (Signed)
Pre visit review using our clinic review tool, if applicable. No additional management support is needed unless otherwise documented below in the visit note. 

## 2016-09-15 NOTE — Patient Instructions (Addendum)
Pharyngitis Pharyngitis is redness, pain, and swelling (inflammation) of your pharynx. What are the causes? Pharyngitis is usually caused by infection. Most of the time, these infections are from viruses (viral) and are part of a cold. However, sometimes pharyngitis is caused by bacteria (bacterial). Pharyngitis can also be caused by allergies. Viral pharyngitis may be spread from person to person by coughing, sneezing, and personal items or utensils (cups, forks, spoons, toothbrushes). Bacterial pharyngitis may be spread from person to person by more intimate contact, such as kissing. What are the signs or symptoms? Symptoms of pharyngitis include:  Sore throat.  Tiredness (fatigue).  Low-grade fever.  Headache.  Joint pain and muscle aches.  Skin rashes.  Swollen lymph nodes.  Plaque-like film on throat or tonsils (often seen with bacterial pharyngitis). How is this diagnosed? Your health care provider will ask you questions about your illness and your symptoms. Your medical history, along with a physical exam, is often all that is needed to diagnose pharyngitis. Sometimes, a rapid strep test is done. Other lab tests may also be done, depending on the suspected cause. How is this treated? Viral pharyngitis will usually get better in 3-4 days without the use of medicine. Bacterial pharyngitis is treated with medicines that kill germs (antibiotics). Follow these instructions at home:  Drink enough water and fluids to keep your urine clear or pale yellow.  Only take over-the-counter or prescription medicines as directed by your health care provider:  If you are prescribed antibiotics, make sure you finish them even if you start to feel better.  Do not take aspirin.  Get lots of rest.  Gargle with 8 oz of salt water ( tsp of salt per 1 qt of water) as often as every 1-2 hours to soothe your throat.  Throat lozenges (if you are not at risk for choking) or sprays may be used to  soothe your throat. Contact a health care provider if:  You have large, tender lumps in your neck.  You have a rash.  You cough up green, yellow-brown, or bloody spit. Get help right away if:  Your neck becomes stiff.  You drool or are unable to swallow liquids.  You vomit or are unable to keep medicines or liquids down.  You have severe pain that does not go away with the use of recommended medicines.  You have trouble breathing (not caused by a stuffy nose). This information is not intended to replace advice given to you by your health care provider. Make sure you discuss any questions you have with your health care provider. Document Released: 08/11/2005 Document Revised: 01/17/2016 Document Reviewed: 04/18/2013 Elsevier Interactive Patient Education  2017 [employer]  Hip Pain Your hip is the joint between your upper legs and your lower pelvis. The bones, cartilage, tendons, and muscles of your hip joint perform a lot of work each day supporting your body weight and allowing you to move around. Hip pain can range from a minor ache to severe pain in one or both of your hips. Pain may be felt on the inside of the hip joint near the groin, or the outside near the buttocks and upper thigh. You may have swelling or stiffness as well. Follow these instructions at home:  Take medicines only as directed by your health care provider.  Apply ice to the injured area:  Put ice in a plastic bag.  Place a towel between your skin and the bag.  Leave the ice on for 15-20 minutes at  a time, 3-4 times a day.  Keep your leg raised (elevated) when possible to lessen swelling.  Avoid activities that cause pain.  Follow specific exercises as directed by your health care provider.  Sleep with a pillow between your legs on your most comfortable side.  Record how often you have hip pain, the location of the pain, and what it feels like. Contact a health care provider if:  You are  unable to put weight on your leg.  Your hip is red or swollen or very tender to touch.  Your pain or swelling continues or worsens after 1 week.  You have increasing difficulty walking.  You have a fever. Get help right away if:  You have fallen.  You have a sudden increase in pain and swelling in your hip. This information is not intended to replace advice given to you by your health care provider. Make sure you discuss any questions you have with your health care provider. Document Released: 01/29/2010 Document Revised: 01/17/2016 Document Reviewed: 04/07/2013 Elsevier Interactive Patient Education  2017 [employer].

## 2016-09-17 ENCOUNTER — Other Ambulatory Visit: Payer: BLUE CROSS/BLUE SHIELD

## 2016-09-17 LAB — CULTURE, GROUP A STREP: Organism ID, Bacteria: NORMAL

## 2016-09-18 ENCOUNTER — Other Ambulatory Visit (INDEPENDENT_AMBULATORY_CARE_PROVIDER_SITE_OTHER): Payer: BLUE CROSS/BLUE SHIELD

## 2016-09-18 DIAGNOSIS — E785 Hyperlipidemia, unspecified: Secondary | ICD-10-CM | POA: Diagnosis not present

## 2016-09-18 DIAGNOSIS — E039 Hypothyroidism, unspecified: Secondary | ICD-10-CM | POA: Diagnosis not present

## 2016-09-18 LAB — COMPREHENSIVE METABOLIC PANEL
ALT: 17 U/L (ref 0–35)
AST: 17 U/L (ref 0–37)
Albumin: 4.6 g/dL (ref 3.5–5.2)
Alkaline Phosphatase: 46 U/L (ref 39–117)
BUN: 15 mg/dL (ref 6–23)
CO2: 26 mEq/L (ref 19–32)
Calcium: 9.8 mg/dL (ref 8.4–10.5)
Chloride: 104 mEq/L (ref 96–112)
Creatinine, Ser: 0.85 mg/dL (ref 0.40–1.20)
GFR: 75.74 mL/min (ref >60.00)
Glucose, Bld: 102 mg/dL — ABNORMAL HIGH (ref 70–99)
Potassium: 4.7 mEq/L (ref 3.5–5.1)
Sodium: 138 mEq/L (ref 135–145)
Total Bilirubin: 0.5 mg/dL (ref 0.2–1.2)
Total Protein: 7.7 g/dL (ref 6.0–8.3)

## 2016-09-18 LAB — LIPID PANEL
Cholesterol: 197 mg/dL (ref 0–200)
HDL: 42.2 mg/dL (ref >39.00)
LDL Cholesterol: 131 mg/dL — ABNORMAL HIGH (ref 0–99)
NonHDL: 154.57
Total CHOL/HDL Ratio: 5
Triglycerides: 119 mg/dL (ref 0.0–149.0)
VLDL: 23.8 mg/dL (ref 0.0–40.0)

## 2016-09-18 LAB — TSH: TSH: 4.54 u[IU]/mL — ABNORMAL HIGH (ref 0.35–4.50)

## 2016-09-25 ENCOUNTER — Encounter: Payer: Self-pay | Admitting: Family Medicine

## 2016-09-25 NOTE — Telephone Encounter (Signed)
Hypothyroid-- inc synthroid to 137 mcg 1 po qd , #30 , 2 refills ---- recheck 2 months Cholesterol--- LDL goal < 100,  HDL >40,  TG < 150.  Diet and exercise will increase HDL and decrease LDL and TG.  Fish,  Fish Oil, Flaxseed oil will also help increase the HDL and decrease Triglycerides.   Recheck labs in 2 months---lipid, cmp Add lipitor 10 mg  #30  1 po qhs, 2 refills  .

## 2016-09-26 ENCOUNTER — Other Ambulatory Visit: Payer: Self-pay | Admitting: Family Medicine

## 2016-09-26 DIAGNOSIS — E039 Hypothyroidism, unspecified: Secondary | ICD-10-CM

## 2016-09-26 DIAGNOSIS — E785 Hyperlipidemia, unspecified: Secondary | ICD-10-CM

## 2016-09-26 MED ORDER — LEVOTHYROXINE SODIUM 137 MCG PO TABS: 137.0000 ug | ORAL_TABLET | Freq: Every day | ORAL | 2 refills | Status: DC

## 2016-09-26 MED ORDER — ATORVASTATIN CALCIUM 10 MG PO TABS
10.0000 mg | ORAL_TABLET | Freq: Every day | ORAL | 2 refills | Status: DC
Start: 2016-09-26 — End: 2016-12-26

## 2016-10-02 ENCOUNTER — Other Ambulatory Visit: Payer: Self-pay

## 2016-10-03 ENCOUNTER — Telehealth: Payer: Self-pay | Admitting: *Deleted

## 2016-10-03 MED ORDER — NORETHIN-ETH ESTRAD-FE BIPHAS 1 MG-10 MCG / 10 MCG PO TABS: ORAL_TABLET | ORAL | 0 refills | Status: DC

## 2016-10-03 NOTE — Telephone Encounter (Signed)
Pt has refill on Feb 15, needs refill on birth control pills, Rx sent.

## 2016-10-09 ENCOUNTER — Encounter: Payer: BLUE CROSS/BLUE SHIELD | Admitting: Gynecology

## 2016-10-14 ENCOUNTER — Ambulatory Visit (INDEPENDENT_AMBULATORY_CARE_PROVIDER_SITE_OTHER): Payer: BLUE CROSS/BLUE SHIELD | Admitting: Gynecology

## 2016-10-14 ENCOUNTER — Encounter: Payer: Self-pay | Admitting: Gynecology

## 2016-10-14 VITALS — BP 118/74 | Ht 64.0 in | Wt 184.0 lb

## 2016-10-14 DIAGNOSIS — Z01419 Encounter for gynecological examination (general) (routine) without abnormal findings: Secondary | ICD-10-CM | POA: Diagnosis not present

## 2016-10-14 MED ORDER — NORETHIN-ETH ESTRAD-FE BIPHAS 1 MG-10 MCG / 10 MCG PO TABS: ORAL_TABLET | ORAL | 4 refills | Status: DC

## 2016-10-14 NOTE — Progress Notes (Signed)
Katie [name] 01-27-1966 [id number]   History:    51 y.o.  for annual gyn exam who has not been seen the office since 2016. Patient is asymptomatic. Patient doing well on 20 g pill which she withdrawals every 3 months which is help with her migraine headaches. Her PCP has been doing her blood work and her flu vaccine is up-to-date.Patient states that many years ago she had abnormal Pap smear and had colposcopy and biopsy but subsequent Pap smear were normal no treatment reported. Patient had endometrial ablation as a result of menorrhagia several years ago and has had great success.   Past medical history,surgical history, family history and social history were all reviewed and documented in the EPIC chart.  Gynecologic History No LMP recorded. Patient is not currently having periods (Reason: Oral contraceptives). Contraception: tubal ligation Last Pap: 2016. Results were: normal Last mammogram: 2015. Results were: normal  Obstetric History OB History  Gravida Para Term Preterm AB Living  4 2     1 2   SAB TAB Ectopic Multiple Live Births      1        # Outcome Date GA Lbr Len/2nd Weight Sex Delivery Anes PTL Lv  4 Gravida           3 Ectopic           2 Para           1 Para                ROS: A ROS was performed and pertinent positives and negatives are included in the history.  GENERAL: No fevers or chills. HEENT: No change in vision, no earache, sore throat or sinus congestion. NECK: No pain or stiffness. CARDIOVASCULAR: No chest pain or pressure. No palpitations. PULMONARY: No shortness of breath, cough or wheeze. GASTROINTESTINAL: No abdominal pain, nausea, vomiting or diarrhea, melena or bright red blood per rectum. GENITOURINARY: No urinary frequency, urgency, hesitancy or dysuria. MUSCULOSKELETAL: No joint or muscle pain, no back pain, no recent trauma. DERMATOLOGIC: No rash, no itching, no lesions. ENDOCRINE: No polyuria, polydipsia, no heat or cold intolerance. No recent  change in weight. HEMATOLOGICAL: No anemia or easy bruising or bleeding. NEUROLOGIC: No headache, seizures, numbness, tingling or weakness. PSYCHIATRIC: No depression, no loss of interest in normal activity or change in sleep pattern.     Exam: chaperone present  BP 118/74   Ht 5\' 4"  (1.626 m)   Wt 184 lb (83.5 kg)   BMI 31.58 kg/m   Body mass index is 31.58 kg/m.  General appearance : Well developed well nourished female. No acute distress HEENT: Eyes: no retinal hemorrhage or exudates,  Neck supple, trachea midline, no carotid bruits, no thyroidmegaly Lungs: Clear to auscultation, no rhonchi or wheezes, or rib retractions  Heart: Regular rate and rhythm, no murmurs or gallops Breast:Examined in sitting and supine position were symmetrical in appearance, no palpable masses or tenderness,  no skin retraction, no nipple inversion, no nipple discharge, no skin discoloration, no axillary or supraclavicular lymphadenopathy Abdomen: no palpable masses or tenderness, no rebound or guarding Extremities: no edema or skin discoloration or tenderness  Pelvic:  Bartholin, Urethra, Skene Glands: Within normal limits             Vagina: No gross lesions or discharge  Cervix: No gross lesions or discharge  Uterus  anteverted, normal size, shape and consistency, non-tender and mobile  Adnexa  Without masses or tenderness  Anus and  perineum  normal   Rectovaginal  normal sphincter tone without palpated masses or tenderness             Hemoccult not indicated     Assessment/Plan:  51 y.o. female for annual exam doing well with continuous oral contraceptive pill 20 g was drawn every 3 months has help with migraines and cramping. Prescription refill provided. Blood work in flu vaccine completed by PCP. Patient overdue for mammogram requisition was provided for her to schedule. She was encouraged to do her monthly breast exams. Pap smear not indicated this year she will need one next  year.   Terrance Mass MD, 8:38 AM 10/14/2016

## 2016-11-04 ENCOUNTER — Other Ambulatory Visit: Payer: Self-pay | Admitting: Family Medicine

## 2016-11-04 ENCOUNTER — Encounter: Payer: Self-pay | Admitting: Family Medicine

## 2016-11-04 MED ORDER — QUETIAPINE FUMARATE 25 MG PO TABS: 25.0000 mg | ORAL_TABLET | Freq: Every day | ORAL | 2 refills | Status: DC

## 2016-11-04 NOTE — Telephone Encounter (Signed)
seroquel 25 mg #30  1 po qhs prn  2 refills

## 2016-11-05 NOTE — Telephone Encounter (Signed)
Ok to inc to seroquel 50

## 2016-11-06 MED ORDER — QUETIAPINE FUMARATE 50 MG PO TABS: 50.0000 mg | ORAL_TABLET | Freq: Every day | ORAL | 1 refills | Status: DC

## 2016-11-11 DIAGNOSIS — Z1231 Encounter for screening mammogram for malignant neoplasm of breast: Secondary | ICD-10-CM | POA: Diagnosis not present

## 2016-11-11 LAB — HM MAMMOGRAPHY

## 2016-11-17 ENCOUNTER — Encounter: Payer: Self-pay | Admitting: *Deleted

## 2016-11-25 ENCOUNTER — Other Ambulatory Visit (INDEPENDENT_AMBULATORY_CARE_PROVIDER_SITE_OTHER): Payer: BLUE CROSS/BLUE SHIELD

## 2016-11-25 DIAGNOSIS — E785 Hyperlipidemia, unspecified: Secondary | ICD-10-CM | POA: Diagnosis not present

## 2016-11-25 DIAGNOSIS — E039 Hypothyroidism, unspecified: Secondary | ICD-10-CM

## 2016-11-25 LAB — COMPREHENSIVE METABOLIC PANEL
ALT: 15 U/L (ref 0–35)
AST: 15 U/L (ref 0–37)
Albumin: 4.1 g/dL (ref 3.5–5.2)
Alkaline Phosphatase: 41 U/L (ref 39–117)
BUN: 15 mg/dL (ref 6–23)
CO2: 28 mEq/L (ref 19–32)
Calcium: 9.4 mg/dL (ref 8.4–10.5)
Chloride: 107 mEq/L (ref 96–112)
Creatinine, Ser: 0.86 mg/dL (ref 0.40–1.20)
GFR: 74.67 mL/min (ref >60.00)
Glucose, Bld: 105 mg/dL — ABNORMAL HIGH (ref 70–99)
Potassium: 4.5 mEq/L (ref 3.5–5.1)
Sodium: 140 mEq/L (ref 135–145)
Total Bilirubin: 0.5 mg/dL (ref 0.2–1.2)
Total Protein: 7 g/dL (ref 6.0–8.3)

## 2016-11-25 LAB — LIPID PANEL
Cholesterol: 141 mg/dL (ref 0–200)
HDL: 36.3 mg/dL — ABNORMAL LOW (ref >39.00)
LDL Cholesterol: 78 mg/dL (ref 0–99)
NonHDL: 104.4
Total CHOL/HDL Ratio: 4
Triglycerides: 134 mg/dL (ref 0.0–149.0)
VLDL: 26.8 mg/dL (ref 0.0–40.0)

## 2016-11-25 LAB — TSH: TSH: 1.21 u[IU]/mL (ref 0.35–4.50)

## 2016-12-01 ENCOUNTER — Other Ambulatory Visit: Payer: Self-pay | Admitting: Family Medicine

## 2016-12-01 ENCOUNTER — Encounter: Payer: Self-pay | Admitting: Family Medicine

## 2016-12-01 DIAGNOSIS — E119 Type 2 diabetes mellitus without complications: Secondary | ICD-10-CM

## 2016-12-01 DIAGNOSIS — E039 Hypothyroidism, unspecified: Secondary | ICD-10-CM

## 2016-12-01 DIAGNOSIS — E785 Hyperlipidemia, unspecified: Secondary | ICD-10-CM

## 2016-12-01 NOTE — Telephone Encounter (Signed)
Ok to add hgba1c to labs in 3 months

## 2016-12-26 ENCOUNTER — Other Ambulatory Visit: Payer: Self-pay | Admitting: Family Medicine

## 2016-12-26 MED ORDER — ATORVASTATIN CALCIUM 10 MG PO TABS: 10.0000 mg | ORAL_TABLET | Freq: Every day | ORAL | 2 refills | Status: DC

## 2016-12-29 ENCOUNTER — Encounter: Payer: Self-pay | Admitting: Family Medicine

## 2016-12-30 MED ORDER — LEVOTHYROXINE SODIUM 137 MCG PO TABS: 137.0000 ug | ORAL_TABLET | Freq: Every day | ORAL | 2 refills | Status: DC

## 2017-01-07 ENCOUNTER — Encounter: Payer: Self-pay | Admitting: Gynecology

## 2017-01-29 ENCOUNTER — Other Ambulatory Visit: Payer: Self-pay | Admitting: Family Medicine

## 2017-01-29 MED ORDER — QUETIAPINE FUMARATE 50 MG PO TABS: 50.0000 mg | ORAL_TABLET | Freq: Every day | ORAL | 1 refills | Status: DC

## 2017-01-29 NOTE — Telephone Encounter (Signed)
Requesting:    seroquel Contract   none UDS    none Last OV    09/15/16----future appt is on 03/19/17 Last Refill    #30 with 1 refills on 11/06/2016  Please Advise

## 2017-03-02 ENCOUNTER — Other Ambulatory Visit (INDEPENDENT_AMBULATORY_CARE_PROVIDER_SITE_OTHER): Payer: BLUE CROSS/BLUE SHIELD

## 2017-03-02 DIAGNOSIS — E039 Hypothyroidism, unspecified: Secondary | ICD-10-CM | POA: Diagnosis not present

## 2017-03-02 DIAGNOSIS — E119 Type 2 diabetes mellitus without complications: Secondary | ICD-10-CM

## 2017-03-02 DIAGNOSIS — E785 Hyperlipidemia, unspecified: Secondary | ICD-10-CM

## 2017-03-02 LAB — COMPREHENSIVE METABOLIC PANEL
ALT: 16 U/L (ref 0–35)
AST: 16 U/L (ref 0–37)
Albumin: 4.3 g/dL (ref 3.5–5.2)
Alkaline Phosphatase: 45 U/L (ref 39–117)
BUN: 16 mg/dL (ref 6–23)
CO2: 24 mEq/L (ref 19–32)
Calcium: 9.8 mg/dL (ref 8.4–10.5)
Chloride: 104 mEq/L (ref 96–112)
Creatinine, Ser: 0.83 mg/dL (ref 0.40–1.20)
GFR: 77.71 mL/min (ref >60.00)
Glucose, Bld: 107 mg/dL — ABNORMAL HIGH (ref 70–99)
Potassium: 4 mEq/L (ref 3.5–5.1)
Sodium: 137 mEq/L (ref 135–145)
Total Bilirubin: 0.5 mg/dL (ref 0.2–1.2)
Total Protein: 7.4 g/dL (ref 6.0–8.3)

## 2017-03-02 LAB — HEMOGLOBIN A1C: Hgb A1c MFr Bld: 5.8 % (ref 4.6–6.5)

## 2017-03-02 LAB — LIPID PANEL
Cholesterol: 146 mg/dL (ref 0–200)
HDL: 39.3 mg/dL (ref >39.00)
LDL Cholesterol: 81 mg/dL (ref 0–99)
NonHDL: 106.32
Total CHOL/HDL Ratio: 4
Triglycerides: 126 mg/dL (ref 0.0–149.0)
VLDL: 25.2 mg/dL (ref 0.0–40.0)

## 2017-03-02 LAB — TSH: TSH: 0.72 u[IU]/mL (ref 0.35–4.50)

## 2017-03-09 ENCOUNTER — Encounter: Payer: Self-pay | Admitting: Family Medicine

## 2017-03-09 NOTE — Telephone Encounter (Signed)
It does not cause diabetes but it can cause glucose to go up a few points.   Risk of stopping lipitor is higher than staying on it.

## 2017-03-19 ENCOUNTER — Encounter: Payer: Self-pay | Admitting: Family Medicine

## 2017-03-19 ENCOUNTER — Ambulatory Visit (INDEPENDENT_AMBULATORY_CARE_PROVIDER_SITE_OTHER): Payer: BLUE CROSS/BLUE SHIELD | Admitting: Family Medicine

## 2017-03-19 VITALS — BP 136/89 | HR 96 | Temp 98.2°F | Resp 18 | Ht 64.0 in | Wt 181.8 lb

## 2017-03-19 DIAGNOSIS — Z0000 Encounter for general adult medical examination without abnormal findings: Secondary | ICD-10-CM

## 2017-03-19 DIAGNOSIS — E785 Hyperlipidemia, unspecified: Secondary | ICD-10-CM

## 2017-03-19 DIAGNOSIS — G4700 Insomnia, unspecified: Secondary | ICD-10-CM

## 2017-03-19 DIAGNOSIS — R739 Hyperglycemia, unspecified: Secondary | ICD-10-CM | POA: Diagnosis not present

## 2017-03-19 DIAGNOSIS — E039 Hypothyroidism, unspecified: Secondary | ICD-10-CM

## 2017-03-19 MED ORDER — QUETIAPINE FUMARATE 50 MG PO TABS: 50.0000 mg | ORAL_TABLET | Freq: Every day | ORAL | 1 refills | Status: DC

## 2017-03-19 MED ORDER — FENOFIBRATE 160 MG PO TABS: 160.0000 mg | ORAL_TABLET | Freq: Every day | ORAL | 1 refills | Status: DC

## 2017-03-19 NOTE — Patient Instructions (Signed)
Preventive Care 40-64 Years, Female Preventive care refers to lifestyle choices and visits with your health care provider that can promote health and wellness. What does preventive care include?  A yearly physical exam. This is also called an annual well check.  Dental exams once or twice a year.  Routine eye exams. Ask your health care provider how often you should have your eyes checked.  Personal lifestyle choices, including: ? Daily care of your teeth and gums. ? Regular physical activity. ? Eating a healthy diet. ? Avoiding tobacco and drug use. ? Limiting alcohol use. ? Practicing safe sex. ? Taking low-dose aspirin daily starting at age 58. ? Taking vitamin and mineral supplements as recommended by your health care provider. What happens during an annual well check? The services and screenings done by your health care provider during your annual well check will depend on your age, overall health, lifestyle risk factors, and family history of disease. Counseling Your health care provider may ask you questions about your:  Alcohol use.  Tobacco use.  Drug use.  Emotional well-being.  Home and relationship well-being.  Sexual activity.  Eating habits.  Work and work Statistician.  Method of birth control.  Menstrual cycle.  Pregnancy history.  Screening You may have the following tests or measurements:  Height, weight, and BMI.  Blood pressure.  Lipid and cholesterol levels. These may be checked every 5 years, or more frequently if you are over 81 years old.  Skin check.  Lung cancer screening. You may have this screening every year starting at age 78 if you have a 30-pack-year history of smoking and currently smoke or have quit within the past 15 years.  Fecal occult blood test (FOBT) of the stool. You may have this test every year starting at age 65.  Flexible sigmoidoscopy or colonoscopy. You may have a sigmoidoscopy every 5 years or a colonoscopy  every 10 years starting at age 30.  Hepatitis C blood test.  Hepatitis B blood test.  Sexually transmitted disease (STD) testing.  Diabetes screening. This is done by checking your blood sugar (glucose) after you have not eaten for a while (fasting). You may have this done every 1-3 years.  Mammogram. This may be done every 1-2 years. Talk to your health care provider about when you should start having regular mammograms. This may depend on whether you have a family history of breast cancer.  BRCA-related cancer screening. This may be done if you have a family history of breast, ovarian, tubal, or peritoneal cancers.  Pelvic exam and Pap test. This may be done every 3 years starting at age 80. Starting at age 36, this may be done every 5 years if you have a Pap test in combination with an HPV test.  Bone density scan. This is done to screen for osteoporosis. You may have this scan if you are at high risk for osteoporosis.  Discuss your test results, treatment options, and if necessary, the need for more tests with your health care provider. Vaccines Your health care provider may recommend certain vaccines, such as:  Influenza vaccine. This is recommended every year.  Tetanus, diphtheria, and acellular pertussis (Tdap, Td) vaccine. You may need a Td booster every 10 years.  Varicella vaccine. You may need this if you have not been vaccinated.  Zoster vaccine. You may need this after age 5.  Measles, mumps, and rubella (MMR) vaccine. You may need at least one dose of MMR if you were born in  1957 or later. You may also need a second dose.  Pneumococcal 13-valent conjugate (PCV13) vaccine. You may need this if you have certain conditions and were not previously vaccinated.  Pneumococcal polysaccharide (PPSV23) vaccine. You may need one or two doses if you smoke cigarettes or if you have certain conditions.  Meningococcal vaccine. You may need this if you have certain  conditions.  Hepatitis A vaccine. You may need this if you have certain conditions or if you travel or work in places where you may be exposed to hepatitis A.  Hepatitis B vaccine. You may need this if you have certain conditions or if you travel or work in places where you may be exposed to hepatitis B.  Haemophilus influenzae type b (Hib) vaccine. You may need this if you have certain conditions.  Talk to your health care provider about which screenings and vaccines you need and how often you need them. This information is not intended to replace advice given to you by your health care provider. Make sure you discuss any questions you have with your health care provider. Document Released: 09/07/2015 Document Revised: 04/30/2016 Document Reviewed: 06/12/2015 Elsevier Interactive Patient Education  2017 [employer].

## 2017-03-19 NOTE — Progress Notes (Signed)
Subjective:  I acted as a Education administrator for Dr. Curt Jews, RMA   Patient ID: Katie [name], female    DOB: Feb 29, 1966, 51 y.o.   MRN: [id number]  Chief Complaint  Patient presents with  . Annual Exam    HPI  Patient is in today for an annual exam. She states she is having trouble sleeping at night, and extremely tired all the time. and also frequent heart burn.   Patient Care Team: Carollee Herter, Alferd Apa, DO as PCP - General   Past Medical History:  Diagnosis Date  . Allergy   . Anemia   . Anxiety   . Blood transfusion without reported diagnosis   . Hyperlipidemia   . Migraine   . PVC (premature ventricular contraction)   . RLS (restless legs syndrome)   . Thyroid disease     Past Surgical History:  Procedure Laterality Date  . BUNIONECTOMY  06/04/2009   Right foot  . CARPAL TUNNEL RELEASE     Bilateral  . CRYOABLATION  11/04  . ECTOPIC PREGNANCY SURGERY  1998  . ENDOMETRIAL ABLATION    . SEPTOPLASTY  6/05  . TUBAL LIGATION  04/1999    Family History  Problem Relation Age of Onset  . Depression Father        Suicide  . Aneurysm Mother        Brain  . Hypertension Sister   . Thyroid disease Sister   . Diabetes Maternal Grandmother   . Alzheimer's disease Maternal Grandmother   . Uterine cancer Maternal Grandmother   . Hypertension Maternal Grandmother   . Osteoporosis Maternal Grandmother   . Cancer Maternal Grandmother        uterine  . Lung cancer Maternal Grandfather   . Cancer Maternal Grandfather        lung  . Cancer Paternal Grandmother        metatstatic-- ? primary colon  . Cancer Paternal Grandfather        skin, hepatic    Social History   Social History  . Marital status: Married    Spouse name: N/A  . Number of children: 2  . Years of education: 15   Occupational History  . 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 Smoking status: Former Smoker    Packs/day: 1.00    Quit date: 01/14/1995  . Smokeless tobacco:  Former Systems developer    Quit date: 08/26/1995     Comment: started smoking at age 53 1ppd  . Alcohol use Yes     Comment: Rare  . Drug use: No  . Sexual activity: Not Currently    Partners: Male     Comment: 1st intercourse 51 yo-More than 5 partners-BTL   Other Topics Concern  . Not on file   Social History Narrative   Exercise-- no    Outpatient Medications Prior to Visit  Medication Sig Dispense Refill  . atorvastatin (LIPITOR) 10 MG tablet Take 1 tablet (10 mg total) by mouth daily. 30 tablet 2  . cetirizine (ZYRTEC) 10 MG tablet Take 1 tablet (10 mg total) by mouth daily.    Marland Kitchen desvenlafaxine (PRISTIQ) 100 MG 24 hr tablet Take 1 tablet (100 mg total) by mouth daily. 90 tablet 3  . levothyroxine (SYNTHROID, LEVOTHROID) 137 MCG tablet Take 1 tablet (137 mcg total) by mouth daily before breakfast. 30 tablet 2  . Norethindrone-Ethinyl Estradiol-Fe Biphas (LO LOESTRIN FE) 1 MG-10 MCG / 10 MCG tablet Take continuous  as instructed to withdraw every three months 3 Package 4  . ondansetron (ZOFRAN ODT) 8 MG disintegrating tablet Take 1 tablet (8 mg total) by mouth every 8 (eight) hours as needed for nausea or vomiting. 20 tablet 2  . valACYclovir (VALTREX) 1000 MG tablet Take 1 tablet (1,000 mg total) by mouth 3 (three) times daily. 30 tablet 2  . fenofibrate 160 MG tablet Take 1 tablet (160 mg total) by mouth daily. 90 tablet 1  . QUEtiapine (SEROQUEL) 50 MG tablet Take 1 tablet (50 mg total) by mouth at bedtime. 30 tablet 1  . meloxicam (MOBIC) 15 MG tablet Take 1 tablet (15 mg total) by mouth daily. (Patient not taking: Reported on 10/14/2016) 30 tablet 0  . Suvorexant (BELSOMRA) 20 MG TABS Take 1 tablet by mouth at bedtime. 30 tablet 5   No facility-administered medications prior to visit.     Allergies  Allergen Reactions  . Morphine     Nausea & vomiting    Review of Systems  Constitutional: Positive for malaise/fatigue. Negative for fever.  HENT: Negative for congestion.   Eyes:  Negative for blurred vision.  Respiratory: Negative for cough and shortness of breath.   Cardiovascular: Negative for chest pain, palpitations and leg swelling.  Gastrointestinal: Negative for vomiting.  Musculoskeletal: Negative for back pain.  Skin: Negative for rash.  Neurological: Negative for loss of consciousness and headaches.       Objective:    Physical Exam  Constitutional: She is oriented to person, place, and time. She appears well-developed and well-nourished. No distress.  HENT:  Head: Normocephalic and atraumatic.  Eyes: Conjunctivae are normal.  Neck: Normal range of motion. No thyromegaly present.  Cardiovascular: Normal rate and regular rhythm.   Pulmonary/Chest: Effort normal and breath sounds normal. She has no wheezes.  Abdominal: Soft. Bowel sounds are normal. There is no tenderness.  Musculoskeletal: Normal range of motion. She exhibits no edema or deformity.  Neurological: She is alert and oriented to person, place, and time.  Skin: Skin is warm and dry. She is not diaphoretic.  Psychiatric: She has a normal mood and affect.    BP 136/89 (BP Location: Left Arm, Patient Position: Sitting, Cuff Size: Normal)   Pulse 96   Temp 98.2 F (36.8 C) (Oral)   Resp 18   Ht 5\' 4"  (1.626 m)   Wt 181 lb 12.8 oz (82.5 kg)   SpO2 98%   BMI 31.21 kg/m  Wt Readings from Last 3 Encounters:  03/19/17 181 lb 12.8 oz (82.5 kg)  10/14/16 184 lb (83.5 kg)  09/15/16 185 lb 6.4 oz (84.1 kg)   BP Readings from Last 3 Encounters:  03/19/17 136/89  10/14/16 118/74  09/15/16 118/70     Immunization History  Administered Date(s) Administered  . Influenza Whole 06/28/2008, 08/08/2009  . Influenza,inj,Quad PF,36+ Mos 07/28/2014  . Influenza-Unspecified 06/12/2016  . Td 06/20/2008  . Tdap 06/20/2008    Health Maintenance  Topic Date Due  . PNEUMOCOCCAL POLYSACCHARIDE VACCINE (1) 04/24/1970  . FOOT EXAM  04/24/1978  . OPHTHALMOLOGY EXAM  04/24/1978  . URINE  MICROALBUMIN  04/24/1978  . HIV Screening  04/25/1983  . INFLUENZA VACCINE  03/25/2017  . HEMOGLOBIN A1C  09/02/2017  . MAMMOGRAM  11/11/2017  . PAP SMEAR  04/16/2018  . TETANUS/TDAP  06/20/2018    Lab Results  Component Value Date   WBC 5.6 06/22/2015   HGB 14.2 06/22/2015   HCT 41.4 06/22/2015   PLT 312.0 04/03/2014  GLUCOSE 107 (H) 03/02/2017   CHOL 146 03/02/2017   TRIG 126.0 03/02/2017   HDL 39.30 03/02/2017   LDLDIRECT 138.3 06/20/2008   LDLCALC 81 03/02/2017   ALT 16 03/02/2017   AST 16 03/02/2017   NA 137 03/02/2017   K 4.0 03/02/2017   CL 104 03/02/2017   CREATININE 0.83 03/02/2017   BUN 16 03/02/2017   CO2 24 03/02/2017   TSH 0.72 03/02/2017   HGBA1C 5.8 03/02/2017    Lab Results  Component Value Date   TSH 0.72 03/02/2017   Lab Results  Component Value Date   WBC 5.6 06/22/2015   HGB 14.2 06/22/2015   HCT 41.4 06/22/2015   MCV 85.4 06/22/2015   PLT 312.0 04/03/2014   Lab Results  Component Value Date   NA 137 03/02/2017   K 4.0 03/02/2017   CO2 24 03/02/2017   GLUCOSE 107 (H) 03/02/2017   BUN 16 03/02/2017   CREATININE 0.83 03/02/2017   BILITOT 0.5 03/02/2017   ALKPHOS 45 03/02/2017   AST 16 03/02/2017   ALT 16 03/02/2017   PROT 7.4 03/02/2017   ALBUMIN 4.3 03/02/2017   CALCIUM 9.8 03/02/2017   GFR 77.71 03/02/2017   Lab Results  Component Value Date   CHOL 146 03/02/2017   Lab Results  Component Value Date   HDL 39.30 03/02/2017   Lab Results  Component Value Date   LDLCALC 81 03/02/2017   Lab Results  Component Value Date   TRIG 126.0 03/02/2017   Lab Results  Component Value Date   CHOLHDL 4 03/02/2017   Lab Results  Component Value Date   HGBA1C 5.8 03/02/2017         Assessment & Plan:   Problem List Items Addressed This Visit      Unprioritized   Hyperglycemia   Relevant Orders   TSH   Lipid panel   Hemoglobin A1c   CBC with Differential/Platelet   Comprehensive metabolic panel   POCT Urinalysis  Dipstick (Automated)   Hyperlipidemia    Tolerating statin, encouraged heart healthy diet, avoid trans fats, minimize simple carbs and saturated fats. Increase exercise as tolerated      Relevant Medications   fenofibrate 160 MG tablet   Other Relevant Orders   TSH   Lipid panel   Hemoglobin A1c   CBC with Differential/Platelet   Comprehensive metabolic panel   POCT Urinalysis Dipstick (Automated)   Hypothyroidism    Check labs  Cont meds      Insomnia   Relevant Medications   QUEtiapine (SEROQUEL) 50 MG tablet   Preventative health care - Primary    ghm utd Check labs See AVS          I have discontinued Ms. Pepitone's Suvorexant and meloxicam. I am also having her maintain her ondansetron, cetirizine, desvenlafaxine, valACYclovir, Norethindrone-Ethinyl Estradiol-Fe Biphas, atorvastatin, levothyroxine, QUEtiapine, and fenofibrate.  Meds ordered this encounter  Medications  . QUEtiapine (SEROQUEL) 50 MG tablet    Sig: Take 1 tablet (50 mg total) by mouth at bedtime.    Dispense:  30 tablet    Refill:  1  . fenofibrate 160 MG tablet    Sig: Take 1 tablet (160 mg total) by mouth daily.    Dispense:  90 tablet    Refill:  1    CMA served as Education administrator during this visit. History, Physical and Plan performed by medical provider. Documentation and orders reviewed and attested to.  [name], DO

## 2017-03-21 DIAGNOSIS — R739 Hyperglycemia, unspecified: Secondary | ICD-10-CM | POA: Insufficient documentation

## 2017-03-21 DIAGNOSIS — Z0000 Encounter for general adult medical examination without abnormal findings: Secondary | ICD-10-CM | POA: Insufficient documentation

## 2017-03-21 NOTE — Assessment & Plan Note (Signed)
Tolerating statin, encouraged heart healthy diet, avoid trans fats, minimize simple carbs and saturated fats. Increase exercise as tolerated 

## 2017-03-21 NOTE — Assessment & Plan Note (Signed)
Check labs Cont' meds 

## 2017-03-21 NOTE — Assessment & Plan Note (Signed)
ghm utd Check labs See AVS 

## 2017-03-24 ENCOUNTER — Other Ambulatory Visit: Payer: Self-pay | Admitting: *Deleted

## 2017-03-24 MED ORDER — ATORVASTATIN CALCIUM 10 MG PO TABS: 10.0000 mg | ORAL_TABLET | Freq: Every day | ORAL | 1 refills | Status: DC

## 2017-03-24 MED ORDER — LEVOTHYROXINE SODIUM 137 MCG PO TABS: 137.0000 ug | ORAL_TABLET | Freq: Every day | ORAL | 1 refills | Status: DC

## 2017-03-27 ENCOUNTER — Other Ambulatory Visit: Payer: Self-pay | Admitting: Family Medicine

## 2017-03-27 ENCOUNTER — Encounter: Payer: Self-pay | Admitting: Family Medicine

## 2017-04-01 ENCOUNTER — Other Ambulatory Visit (INDEPENDENT_AMBULATORY_CARE_PROVIDER_SITE_OTHER): Payer: BLUE CROSS/BLUE SHIELD

## 2017-04-01 DIAGNOSIS — R739 Hyperglycemia, unspecified: Secondary | ICD-10-CM

## 2017-04-01 DIAGNOSIS — E785 Hyperlipidemia, unspecified: Secondary | ICD-10-CM

## 2017-04-01 LAB — COMPREHENSIVE METABOLIC PANEL
ALT: 15 U/L (ref 0–35)
AST: 16 U/L (ref 0–37)
Albumin: 4.1 g/dL (ref 3.5–5.2)
Alkaline Phosphatase: 44 U/L (ref 39–117)
BUN: 16 mg/dL (ref 6–23)
CO2: 30 mEq/L (ref 19–32)
Calcium: 9.3 mg/dL (ref 8.4–10.5)
Chloride: 104 mEq/L (ref 96–112)
Creatinine, Ser: 0.84 mg/dL (ref 0.40–1.20)
GFR: 76.61 mL/min (ref >60.00)
Glucose, Bld: 98 mg/dL (ref 70–99)
Potassium: 4.3 mEq/L (ref 3.5–5.1)
Sodium: 139 mEq/L (ref 135–145)
Total Bilirubin: 0.4 mg/dL (ref 0.2–1.2)
Total Protein: 7.2 g/dL (ref 6.0–8.3)

## 2017-04-01 LAB — TSH: TSH: 0.36 u[IU]/mL (ref 0.35–4.50)

## 2017-04-01 LAB — CBC WITH DIFFERENTIAL/PLATELET
Basophils Absolute: 0 10*3/uL (ref 0.0–0.1)
Basophils Relative: 0.8 % (ref 0.0–3.0)
Eosinophils Absolute: 0.1 10*3/uL (ref 0.0–0.7)
Eosinophils Relative: 3.4 % (ref 0.0–5.0)
HCT: 38.5 % (ref 36.0–46.0)
Hemoglobin: 12.8 g/dL (ref 12.0–15.0)
Lymphocytes Relative: 48.2 % — ABNORMAL HIGH (ref 12.0–46.0)
Lymphs Abs: 2 10*3/uL (ref 0.7–4.0)
MCHC: 33.3 g/dL (ref 30.0–36.0)
MCV: 88.9 fl (ref 78.0–100.0)
Monocytes Absolute: 0.4 10*3/uL (ref 0.1–1.0)
Monocytes Relative: 8.6 % (ref 3.0–12.0)
Neutro Abs: 1.6 10*3/uL (ref 1.4–7.7)
Neutrophils Relative %: 39 % — ABNORMAL LOW (ref 43.0–77.0)
Platelets: 295 10*3/uL (ref 150.0–400.0)
RBC: 4.33 Mil/uL (ref 3.87–5.11)
RDW: 12.5 % (ref 11.5–15.5)
WBC: 4.2 10*3/uL (ref 4.0–10.5)

## 2017-04-01 LAB — LIPID PANEL
Cholesterol: 154 mg/dL (ref 0–200)
HDL: 40.6 mg/dL (ref >39.00)
LDL Cholesterol: 88 mg/dL (ref 0–99)
NonHDL: 113.49
Total CHOL/HDL Ratio: 4
Triglycerides: 127 mg/dL (ref 0.0–149.0)
VLDL: 25.4 mg/dL (ref 0.0–40.0)

## 2017-04-01 LAB — HEMOGLOBIN A1C: Hgb A1c MFr Bld: 5.6 % (ref 4.6–6.5)

## 2017-04-04 ENCOUNTER — Encounter: Payer: Self-pay | Admitting: Family Medicine

## 2017-04-04 DIAGNOSIS — F329 Major depressive disorder, single episode, unspecified: Secondary | ICD-10-CM

## 2017-04-04 DIAGNOSIS — F32A Depression, unspecified: Secondary | ICD-10-CM

## 2017-04-06 ENCOUNTER — Encounter: Payer: Self-pay | Admitting: Family Medicine

## 2017-04-06 DIAGNOSIS — E785 Hyperlipidemia, unspecified: Secondary | ICD-10-CM

## 2017-04-06 MED ORDER — DESVENLAFAXINE SUCCINATE ER 100 MG PO TB24: 100.0000 mg | ORAL_TABLET | Freq: Every day | ORAL | 1 refills | Status: DC

## 2017-04-07 MED ORDER — FENOFIBRATE 160 MG PO TABS: 160.0000 mg | ORAL_TABLET | Freq: Every day | ORAL | 1 refills | Status: DC

## 2017-04-20 ENCOUNTER — Ambulatory Visit (INDEPENDENT_AMBULATORY_CARE_PROVIDER_SITE_OTHER): Payer: BLUE CROSS/BLUE SHIELD | Admitting: Family Medicine

## 2017-04-20 ENCOUNTER — Encounter: Payer: Self-pay | Admitting: Family Medicine

## 2017-04-20 VITALS — BP 124/66 | HR 82 | Temp 98.2°F | Ht 64.0 in | Wt 181.4 lb

## 2017-04-20 DIAGNOSIS — H6092 Unspecified otitis externa, left ear: Secondary | ICD-10-CM | POA: Diagnosis not present

## 2017-04-20 DIAGNOSIS — J0140 Acute pansinusitis, unspecified: Secondary | ICD-10-CM | POA: Diagnosis not present

## 2017-04-20 MED ORDER — AMOXICILLIN-POT CLAVULANATE 875-125 MG PO TABS: 1.0000 | ORAL_TABLET | Freq: Two times a day (BID) | ORAL | 0 refills | Status: DC

## 2017-04-20 MED ORDER — OFLOXACIN 0.3 % OT SOLN: 10.0000 [drp] | Freq: Every day | OTIC | 0 refills | Status: DC

## 2017-04-20 NOTE — Progress Notes (Signed)
Patient ID: Katie [name], female    DOB: [birth year]  Age: 51 y.o. MRN: [id number]    Subjective:  Subjective  HPI Katie [name] presents for L ear / jaw pain x 2 weeks.  Some congestion  Review of Systems  HENT: Positive for congestion, ear pain, facial swelling, sinus pain and sinus pressure. Negative for sore throat, tinnitus, trouble swallowing and voice change.   Eyes: Negative for photophobia, pain, discharge, redness, itching and visual disturbance.    History Past Medical History:  Diagnosis Date  . Allergy   . Anemia   . Anxiety   . Blood transfusion without reported diagnosis   . Hyperlipidemia   . Migraine   . PVC (premature ventricular contraction)   . RLS (restless legs syndrome)   . Thyroid disease     She has a past surgical history that includes Tubal ligation (04/1999); Carpal tunnel release; Septoplasty (6/05); Cryoablation (11/04); Ectopic pregnancy surgery (1998); Bunionectomy (06/04/2009); and Endometrial ablation.   Her family history includes Alzheimer's disease in her maternal grandmother; Aneurysm in her mother; Cancer in her maternal grandfather, maternal grandmother, paternal grandfather, and paternal grandmother; Depression in her father; Diabetes in her maternal grandmother; Hypertension in her maternal grandmother and sister; Lung cancer in her maternal grandfather; Osteoporosis in her maternal grandmother; Thyroid disease in her sister; Uterine cancer in her maternal grandmother.She reports that she quit smoking about 22 years ago. She smoked 1.00 pack per day. She quit smokeless tobacco use about 21 years ago. She reports that she drinks alcohol. She reports that she does not use drugs.  Current Outpatient Prescriptions on File Prior to Visit  Medication Sig Dispense Refill  . atorvastatin (LIPITOR) 10 MG tablet Take 1 tablet (10 mg total) by mouth daily. 90 tablet 1  . cetirizine (ZYRTEC) 10 MG tablet Take 1 tablet (10 mg total) by mouth daily.    Marland Kitchen  desvenlafaxine (PRISTIQ) 100 MG 24 hr tablet Take 1 tablet (100 mg total) by mouth daily. 90 tablet 1  . fenofibrate 160 MG tablet Take 1 tablet (160 mg total) by mouth daily. 90 tablet 1  . levothyroxine (SYNTHROID, LEVOTHROID) 137 MCG tablet Take 1 tablet (137 mcg total) by mouth daily before breakfast. 90 tablet 1  . Norethindrone-Ethinyl Estradiol-Fe Biphas (LO LOESTRIN FE) 1 MG-10 MCG / 10 MCG tablet Take continuous as instructed to withdraw every three months 3 Package 4  . ondansetron (ZOFRAN ODT) 8 MG disintegrating tablet Take 1 tablet (8 mg total) by mouth every 8 (eight) hours as needed for nausea or vomiting. 20 tablet 2  . QUEtiapine (SEROQUEL) 50 MG tablet Take 1 tablet (50 mg total) by mouth at bedtime. 30 tablet 1  . valACYclovir (VALTREX) 1000 MG tablet Take 1 tablet (1,000 mg total) by mouth 3 (three) times daily. 30 tablet 2   No current facility-administered medications on file prior to visit.      Objective:  Objective  Physical Exam  Constitutional: She is oriented to person, place, and time. She appears well-developed and well-nourished.  HENT:  Right Ear: Hearing, tympanic membrane, external ear and ear canal normal.  Left Ear: There is swelling and tenderness. Tympanic membrane is not injected, not scarred, not perforated and not erythematous.  Ears:  + PND + errythema  Eyes: Conjunctivae are normal. Right eye exhibits no discharge. Left eye exhibits no discharge.  Cardiovascular: Normal rate, regular rhythm and normal heart sounds.   No murmur heard. Pulmonary/Chest: Effort normal and breath sounds normal. No respiratory  distress. She has no wheezes. She has no rales. She exhibits no tenderness.  Musculoskeletal: She exhibits no edema.  Lymphadenopathy:    She has cervical adenopathy.  Neurological: She is alert and oriented to person, place, and time.  Nursing note and vitals reviewed.  BP 124/66   Pulse 82   Temp 98.2 F (36.8 C) (Oral)   Ht 5\' 4"   (1.626 m)   Wt 181 lb 6.4 oz (82.3 kg)   LMP 02/23/2017   SpO2 97%   BMI 31.14 kg/m  Wt Readings from Last 3 Encounters:  04/20/17 181 lb 6.4 oz (82.3 kg)  03/19/17 181 lb 12.8 oz (82.5 kg)  10/14/16 184 lb (83.5 kg)     Lab Results  Component Value Date   WBC 4.2 04/01/2017   HGB 12.8 04/01/2017   HCT 38.5 04/01/2017   PLT 295.0 04/01/2017   GLUCOSE 98 04/01/2017   CHOL 154 04/01/2017   TRIG 127.0 04/01/2017   HDL 40.60 04/01/2017   LDLDIRECT 138.3 06/20/2008   LDLCALC 88 04/01/2017   ALT 15 04/01/2017   AST 16 04/01/2017   NA 139 04/01/2017   K 4.3 04/01/2017   CL 104 04/01/2017   CREATININE 0.84 04/01/2017   BUN 16 04/01/2017   CO2 30 04/01/2017   TSH 0.36 04/01/2017   HGBA1C 5.6 04/01/2017    Dg Hip Unilat With Pelvis 2-3 Views Left  Result Date: 09/15/2016 CLINICAL DATA:  Left hip pain for several weeks.  No known injury. EXAM: DG HIP (WITH OR WITHOUT PELVIS) 2-3V LEFT COMPARISON:  None. FINDINGS: Normal appearing bones and soft tissues. Left inferior pubic ramus bone island. IMPRESSION: Normal examination. Electronically Signed   By: Claudie Revering M.D.   On: 09/15/2016 16:50     Assessment & Plan:  Plan  I am having Katie [name] start on amoxicillin-clavulanate and ofloxacin. I am also having her maintain her ondansetron, cetirizine, valACYclovir, Norethindrone-Ethinyl Estradiol-Fe Biphas, QUEtiapine, atorvastatin, levothyroxine, desvenlafaxine, and fenofibrate.  Meds ordered this encounter  Medications  . amoxicillin-clavulanate (AUGMENTIN) 875-125 MG tablet    Sig: Take 1 tablet by mouth 2 (two) times daily.    Dispense:  20 tablet    Refill:  0  . ofloxacin (FLOXIN OTIC) 0.3 % OTIC solution    Sig: Place 10 drops into the left ear daily.    Dispense:  5 mL    Refill:  0    Problem List Items Addressed This Visit      Unprioritized   Sinusitis    abx per orders flonase and zyrtec rto prn       Relevant Medications   amoxicillin-clavulanate  (AUGMENTIN) 875-125 MG tablet    Other Visit Diagnoses    Otitis externa of left ear, unspecified chronicity, unspecified type    -  Primary   Relevant Medications   amoxicillin-clavulanate (AUGMENTIN) 875-125 MG tablet   ofloxacin (FLOXIN OTIC) 0.3 % OTIC solution           Follow-up: Return if symptoms worsen or fail to improve.  [name], DO

## 2017-04-20 NOTE — Assessment & Plan Note (Signed)
abx per orders flonase and zyrtec rto prn

## 2017-04-20 NOTE — Patient Instructions (Signed)

## 2017-05-30 ENCOUNTER — Encounter: Payer: Self-pay | Admitting: Family Medicine

## 2017-05-30 DIAGNOSIS — G4700 Insomnia, unspecified: Secondary | ICD-10-CM

## 2017-06-01 MED ORDER — QUETIAPINE FUMARATE 50 MG PO TABS: 50.0000 mg | ORAL_TABLET | Freq: Every day | ORAL | 3 refills | Status: DC

## 2017-06-15 DIAGNOSIS — Z23 Encounter for immunization: Secondary | ICD-10-CM | POA: Diagnosis not present

## 2017-09-08 DIAGNOSIS — M65311 Trigger thumb, right thumb: Secondary | ICD-10-CM | POA: Diagnosis not present

## 2017-09-17 ENCOUNTER — Ambulatory Visit: Payer: BLUE CROSS/BLUE SHIELD | Admitting: Family Medicine

## 2017-09-17 ENCOUNTER — Encounter: Payer: Self-pay | Admitting: Family Medicine

## 2017-09-17 VITALS — BP 128/86 | HR 87 | Temp 98.2°F | Resp 16 | Ht 64.0 in | Wt 180.4 lb

## 2017-09-17 DIAGNOSIS — F329 Major depressive disorder, single episode, unspecified: Secondary | ICD-10-CM | POA: Diagnosis not present

## 2017-09-17 DIAGNOSIS — E039 Hypothyroidism, unspecified: Secondary | ICD-10-CM

## 2017-09-17 DIAGNOSIS — R739 Hyperglycemia, unspecified: Secondary | ICD-10-CM | POA: Diagnosis not present

## 2017-09-17 DIAGNOSIS — G4700 Insomnia, unspecified: Secondary | ICD-10-CM

## 2017-09-17 DIAGNOSIS — E785 Hyperlipidemia, unspecified: Secondary | ICD-10-CM | POA: Diagnosis not present

## 2017-09-17 DIAGNOSIS — F32A Depression, unspecified: Secondary | ICD-10-CM

## 2017-09-17 LAB — COMPREHENSIVE METABOLIC PANEL
ALT: 15 U/L (ref 0–35)
AST: 16 U/L (ref 0–37)
Albumin: 4.3 g/dL (ref 3.5–5.2)
Alkaline Phosphatase: 51 U/L (ref 39–117)
BUN: 18 mg/dL (ref 6–23)
CO2: 25 mEq/L (ref 19–32)
Calcium: 9.3 mg/dL (ref 8.4–10.5)
Chloride: 101 mEq/L (ref 96–112)
Creatinine, Ser: 0.75 mg/dL (ref 0.40–1.20)
GFR: 87.15 mL/min (ref >60.00)
Glucose, Bld: 102 mg/dL — ABNORMAL HIGH (ref 70–99)
Potassium: 4.1 mEq/L (ref 3.5–5.1)
Sodium: 136 mEq/L (ref 135–145)
Total Bilirubin: 0.5 mg/dL (ref 0.2–1.2)
Total Protein: 7.8 g/dL (ref 6.0–8.3)

## 2017-09-17 LAB — LIPID PANEL
Cholesterol: 158 mg/dL (ref 0–200)
HDL: 43.6 mg/dL (ref >39.00)
LDL Cholesterol: 84 mg/dL (ref 0–99)
NonHDL: 114.29
Total CHOL/HDL Ratio: 4
Triglycerides: 149 mg/dL (ref 0.0–149.0)
VLDL: 29.8 mg/dL (ref 0.0–40.0)

## 2017-09-17 LAB — HEMOGLOBIN A1C: Hgb A1c MFr Bld: 6.2 % (ref 4.6–6.5)

## 2017-09-17 MED ORDER — DESVENLAFAXINE SUCCINATE ER 100 MG PO TB24: 100.0000 mg | ORAL_TABLET | Freq: Every day | ORAL | 1 refills | Status: DC

## 2017-09-17 MED ORDER — LEVOTHYROXINE SODIUM 137 MCG PO TABS: 137.0000 ug | ORAL_TABLET | Freq: Every day | ORAL | 1 refills | Status: DC

## 2017-09-17 MED ORDER — ATORVASTATIN CALCIUM 10 MG PO TABS: 10.0000 mg | ORAL_TABLET | Freq: Every day | ORAL | 1 refills | Status: DC

## 2017-09-17 MED ORDER — QUETIAPINE FUMARATE 50 MG PO TABS: 50.0000 mg | ORAL_TABLET | Freq: Every day | ORAL | 3 refills | Status: DC

## 2017-09-17 MED ORDER — FENOFIBRATE 160 MG PO TABS: 160.0000 mg | ORAL_TABLET | Freq: Every day | ORAL | 1 refills | Status: DC

## 2017-09-17 NOTE — Patient Instructions (Signed)

## 2017-09-17 NOTE — Progress Notes (Signed)
Patient ID: Katie [name], female   DOB: 11-Sep-1965, 52 y.o.   MRN: [id number]     Subjective:  I acted as a Education administrator for Dr. Carollee Herter.  Guerry Bruin, [place]   Patient ID: Katie [name], female    DOB: 09-09-1965, 52 y.o.   MRN: [id number]  Chief Complaint  Patient presents with  . Hyperlipidemia    HPI  Patient is in today for follow up cholesterol.  She has been doing well on current treatment.  Patient Care Team: Carollee Herter, Alferd Apa, DO as PCP - General   Past Medical History:  Diagnosis Date  . Allergy   . Anemia   . Anxiety   . Blood transfusion without reported diagnosis   . Hyperlipidemia   . Migraine   . PVC (premature ventricular contraction)   . RLS (restless legs syndrome)   . Thyroid disease     Past Surgical History:  Procedure Laterality Date  . BUNIONECTOMY  06/04/2009   Right foot  . CARPAL TUNNEL RELEASE     Bilateral  . CRYOABLATION  11/04  . ECTOPIC PREGNANCY SURGERY  1998  . ENDOMETRIAL ABLATION    . SEPTOPLASTY  6/05  . TUBAL LIGATION  04/1999    Family History  Problem Relation Age of Onset  . Depression Father        Suicide  . Aneurysm Mother        Brain  . Hypertension Sister   . Thyroid disease Sister   . Diabetes Maternal Grandmother   . Alzheimer's disease Maternal Grandmother   . Uterine cancer Maternal Grandmother   . Hypertension Maternal Grandmother   . Osteoporosis Maternal Grandmother   . Cancer Maternal Grandmother        uterine  . Lung cancer Maternal Grandfather   . Cancer Maternal Grandfather        lung  . Cancer Paternal Grandmother        metatstatic-- ? primary colon  . Cancer Paternal Grandfather        skin, hepatic    Social History   Socioeconomic History  . Marital status: Married    Spouse name: Not on file  . Number of children: 2  . Years of education: 49  . Highest education level: Not on file  Social Needs  . Financial resource strain: Not on file  . Food insecurity - worry: Not on file  .  Food insecurity - inability: Not on file  . Transportation needs - medical: Not on file  . Transportation needs - non-medical: Not on file  Occupational History  . Occupation: scheduler [employer]  Tobacco Use  . Smoking status: Former Smoker    Packs/day: 1.00    Last attempt to quit: 01/14/1995    Years since quitting: 22.6  . Smokeless tobacco: Former Systems developer    Quit date: 08/26/1995  . Tobacco comment: started smoking at age 53 1ppd  Substance and Sexual Activity  . Alcohol use: Yes    Comment: Rare  . Drug use: No  . Sexual activity: Not Currently    Partners: Male    Comment: 1st intercourse 52 yo-More than 5 partners-BTL  Other Topics Concern  . Not on file  Social History Narrative   Exercise-- no    Outpatient Medications Prior to Visit  Medication Sig Dispense Refill  . cetirizine (ZYRTEC) 10 MG tablet Take 1 tablet (10 mg total) by mouth daily.    . Norethindrone-Ethinyl Estradiol-Fe Biphas (LO LOESTRIN FE) 1 MG-10 MCG /  10 MCG tablet Take continuous as instructed to withdraw every three months 3 Package 4  . ondansetron (ZOFRAN ODT) 8 MG disintegrating tablet Take 1 tablet (8 mg total) by mouth every 8 (eight) hours as needed for nausea or vomiting. 20 tablet 2  . valACYclovir (VALTREX) 1000 MG tablet Take 1 tablet (1,000 mg total) by mouth 3 (three) times daily. 30 tablet 2  . atorvastatin (LIPITOR) 10 MG tablet Take 1 tablet (10 mg total) by mouth daily. 90 tablet 1  . desvenlafaxine (PRISTIQ) 100 MG 24 hr tablet Take 1 tablet (100 mg total) by mouth daily. 90 tablet 1  . fenofibrate 160 MG tablet Take 1 tablet (160 mg total) by mouth daily. 90 tablet 1  . levothyroxine (SYNTHROID, LEVOTHROID) 137 MCG tablet Take 1 tablet (137 mcg total) by mouth daily before breakfast. 90 tablet 1  . QUEtiapine (SEROQUEL) 50 MG tablet Take 1 tablet (50 mg total) by mouth at bedtime. 30 tablet 3  . amoxicillin-clavulanate (AUGMENTIN) 875-125 MG tablet Take 1 tablet by mouth 2 (two)  times daily. 20 tablet 0  . ofloxacin (FLOXIN OTIC) 0.3 % OTIC solution Place 10 drops into the left ear daily. 5 mL 0   No facility-administered medications prior to visit.     Allergies  Allergen Reactions  . Morphine     Nausea & vomiting    Review of Systems  Constitutional: Negative for chills, fever and malaise/fatigue.  HENT: Negative for congestion and hearing loss.   Eyes: Negative for discharge.  Respiratory: Negative for cough, sputum production and shortness of breath.   Cardiovascular: Negative for chest pain, palpitations and leg swelling.  Gastrointestinal: Negative for abdominal pain, blood in stool, constipation, diarrhea, heartburn, nausea and vomiting.  Genitourinary: Negative for dysuria, frequency, hematuria and urgency.  Musculoskeletal: Negative for back pain, falls and myalgias.  Skin: Negative for rash.  Neurological: Negative for dizziness, sensory change, loss of consciousness, weakness and headaches.  Endo/Heme/Allergies: Negative for environmental allergies. Does not bruise/bleed easily.  Psychiatric/Behavioral: Negative for depression and suicidal ideas. The patient is not nervous/anxious and does not have insomnia.        Objective:    Physical Exam  Constitutional: She is oriented to person, place, and time. She appears well-developed and well-nourished.  HENT:  Head: Normocephalic and atraumatic.  Eyes: Conjunctivae and EOM are normal.  Neck: Normal range of motion. Neck supple. No JVD present. Carotid bruit is not present. No thyromegaly present.  Cardiovascular: Normal rate, regular rhythm and normal heart sounds.  No murmur heard. Pulmonary/Chest: Effort normal and breath sounds normal. No respiratory distress. She has no wheezes. She has no rales. She exhibits no tenderness.  Musculoskeletal: She exhibits no edema.  Neurological: She is alert and oriented to person, place, and time.  Psychiatric: She has a normal mood and affect. Her  behavior is normal. Judgment and thought content normal.  Nursing note and vitals reviewed.   BP 128/86   Pulse 87   Temp 98.2 F (36.8 C) (Oral)   Resp 16   Ht 5\' 4"  (1.626 m)   Wt 180 lb 6.4 oz (81.8 kg)   SpO2 98%   BMI 30.97 kg/m  Wt Readings from Last 3 Encounters:  09/17/17 180 lb 6.4 oz (81.8 kg)  04/20/17 181 lb 6.4 oz (82.3 kg)  03/19/17 181 lb 12.8 oz (82.5 kg)   BP Readings from Last 3 Encounters:  09/17/17 128/86  04/20/17 124/66  03/19/17 136/89     Immunization  History  Administered Date(s) Administered  . Influenza Whole 06/28/2008, 08/08/2009  . Influenza,inj,Quad PF,6+ Mos 07/28/2014  . Influenza-Unspecified 06/12/2016  . Td 06/20/2008  . Tdap 06/20/2008    Health Maintenance  Topic Date Due  . URINE MICROALBUMIN  04/24/1978  . HIV Screening  04/25/1983  . MAMMOGRAM  11/11/2017  . PAP SMEAR  04/16/2018  . TETANUS/TDAP  06/20/2018  . INFLUENZA VACCINE  Completed    Lab Results  Component Value Date   WBC 4.2 04/01/2017   HGB 12.8 04/01/2017   HCT 38.5 04/01/2017   PLT 295.0 04/01/2017   GLUCOSE 98 04/01/2017   CHOL 154 04/01/2017   TRIG 127.0 04/01/2017   HDL 40.60 04/01/2017   LDLDIRECT 138.3 06/20/2008   LDLCALC 88 04/01/2017   ALT 15 04/01/2017   AST 16 04/01/2017   NA 139 04/01/2017   K 4.3 04/01/2017   CL 104 04/01/2017   CREATININE 0.84 04/01/2017   BUN 16 04/01/2017   CO2 30 04/01/2017   TSH 0.36 04/01/2017   HGBA1C 5.6 04/01/2017    Lab Results  Component Value Date   TSH 0.36 04/01/2017   Lab Results  Component Value Date   WBC 4.2 04/01/2017   HGB 12.8 04/01/2017   HCT 38.5 04/01/2017   MCV 88.9 04/01/2017   PLT 295.0 04/01/2017   Lab Results  Component Value Date   NA 139 04/01/2017   K 4.3 04/01/2017   CO2 30 04/01/2017   GLUCOSE 98 04/01/2017   BUN 16 04/01/2017   CREATININE 0.84 04/01/2017   BILITOT 0.4 04/01/2017   ALKPHOS 44 04/01/2017   AST 16 04/01/2017   ALT 15 04/01/2017   PROT 7.2  04/01/2017   ALBUMIN 4.1 04/01/2017   CALCIUM 9.3 04/01/2017   GFR 76.61 04/01/2017   Lab Results  Component Value Date   CHOL 154 04/01/2017   Lab Results  Component Value Date   HDL 40.60 04/01/2017   Lab Results  Component Value Date   LDLCALC 88 04/01/2017   Lab Results  Component Value Date   TRIG 127.0 04/01/2017   Lab Results  Component Value Date   CHOLHDL 4 04/01/2017   Lab Results  Component Value Date   HGBA1C 5.6 04/01/2017         Assessment & Plan:   Problem List Items Addressed This Visit      Unprioritized   Hyperglycemia   Relevant Orders   Hemoglobin A1c   Hyperlipidemia    Tolerating statin, encouraged heart healthy diet, avoid trans fats, minimize simple carbs and saturated fats. Increase exercise as tolerated      Relevant Medications   fenofibrate 160 MG tablet   atorvastatin (LIPITOR) 10 MG tablet   Hypothyroidism    Check labs      Relevant Medications   levothyroxine (SYNTHROID, LEVOTHROID) 137 MCG tablet   Insomnia   Relevant Medications   QUEtiapine (SEROQUEL) 50 MG tablet    Other Visit Diagnoses    Hyperlipidemia LDL goal <100    -  Primary   Relevant Medications   fenofibrate 160 MG tablet   atorvastatin (LIPITOR) 10 MG tablet   Other Relevant Orders   Comprehensive metabolic panel   Lipid panel   Depression, unspecified depression type       Relevant Medications   desvenlafaxine (PRISTIQ) 100 MG 24 hr tablet      I have discontinued Maribella Wolfinger's amoxicillin-clavulanate and ofloxacin. I am also having her maintain her ondansetron, cetirizine, valACYclovir, Norethindrone-Ethinyl  Estradiol-Fe Biphas, QUEtiapine, levothyroxine, fenofibrate, desvenlafaxine, and atorvastatin.  Meds ordered this encounter  Medications  . QUEtiapine (SEROQUEL) 50 MG tablet    Sig: Take 1 tablet (50 mg total) by mouth at bedtime.    Dispense:  30 tablet    Refill:  3  . levothyroxine (SYNTHROID, LEVOTHROID) 137 MCG tablet    Sig:  Take 1 tablet (137 mcg total) by mouth daily before breakfast.    Dispense:  90 tablet    Refill:  1  . fenofibrate 160 MG tablet    Sig: Take 1 tablet (160 mg total) by mouth daily.    Dispense:  90 tablet    Refill:  1  . desvenlafaxine (PRISTIQ) 100 MG 24 hr tablet    Sig: Take 1 tablet (100 mg total) by mouth daily.    Dispense:  90 tablet    Refill:  1  . atorvastatin (LIPITOR) 10 MG tablet    Sig: Take 1 tablet (10 mg total) by mouth daily.    Dispense:  90 tablet    Refill:  1    CMA served as Education administrator during this visit. History, Physical and Plan performed by medical provider. Documentation and orders reviewed and attested to.  [name], DO

## 2017-09-17 NOTE — Assessment & Plan Note (Signed)
Check labs 

## 2017-09-17 NOTE — Assessment & Plan Note (Signed)
Tolerating statin, encouraged heart healthy diet, avoid trans fats, minimize simple carbs and saturated fats. Increase exercise as tolerated 

## 2017-09-23 ENCOUNTER — Encounter: Payer: Self-pay | Admitting: Family Medicine

## 2017-09-24 NOTE — Telephone Encounter (Signed)
See her most recent labs

## 2017-09-25 ENCOUNTER — Encounter: Payer: Self-pay | Admitting: Family Medicine

## 2017-09-28 NOTE — Telephone Encounter (Signed)
Based on these labs she is now diabetic

## 2017-09-28 NOTE — Telephone Encounter (Signed)
I told her in her labs she was now diabetic ----   We need to repeat in 3 months I had wanted diabetic ed material either sent to her or left up front for her to pick up

## 2017-10-01 ENCOUNTER — Ambulatory Visit: Payer: BLUE CROSS/BLUE SHIELD | Admitting: Family Medicine

## 2017-10-01 ENCOUNTER — Encounter: Payer: Self-pay | Admitting: Family Medicine

## 2017-10-01 VITALS — BP 130/82 | HR 87 | Temp 98.2°F | Resp 18 | Ht 64.0 in | Wt 181.0 lb

## 2017-10-01 DIAGNOSIS — R739 Hyperglycemia, unspecified: Secondary | ICD-10-CM

## 2017-10-01 DIAGNOSIS — E785 Hyperlipidemia, unspecified: Secondary | ICD-10-CM | POA: Diagnosis not present

## 2017-10-01 DIAGNOSIS — E1165 Type 2 diabetes mellitus with hyperglycemia: Secondary | ICD-10-CM | POA: Diagnosis not present

## 2017-10-01 MED ORDER — ROSUVASTATIN CALCIUM 10 MG PO TABS: 10.0000 mg | ORAL_TABLET | Freq: Every day | ORAL | 2 refills | Status: DC

## 2017-10-01 NOTE — Patient Instructions (Signed)
Hyperglycemia Hyperglycemia occurs when the level of sugar (glucose) in the blood is too high. Glucose is a type of sugar that provides the body's main source of energy. Certain hormones (insulin and glucagon) control the level of glucose in the blood. Insulin lowers blood glucose, and glucagon increases blood glucose. Hyperglycemia can result from having too little insulin in the bloodstream, or from the body not responding normally to insulin. Hyperglycemia occurs most often in people who have diabetes (diabetes mellitus), but it can happen in people who do not have diabetes. It can develop quickly, and it can be life-threatening if it causes you to become severely dehydrated (diabetic ketoacidosis or hyperglycemic hyperosmolar state). Severe hyperglycemia is a medical emergency. What are the causes? If you have diabetes, hyperglycemia may be caused by:  Diabetes medicine.  Medicines that increase blood glucose or affect your diabetes control.  Not eating enough, or not eating often enough.  Changes in physical activity level.  Being sick or having an infection.  If you have prediabetes or undiagnosed diabetes:  Hyperglycemia may be caused by those conditions.  If you do not have diabetes, hyperglycemia may be caused by:  Certain medicines, including steroid medicines, beta-blockers, epinephrine, and thiazide diuretics.  Stress.  Serious illness.  Surgery.  Diseases of the pancreas.  Infection.  What increases the risk? Hyperglycemia is more likely to develop in people who have risk factors for diabetes, such as:  Having a family member with diabetes.  Having a gene for type 1 diabetes that is passed from parent to child (inherited).  Living in an area with cold weather conditions.  Exposure to certain viruses.  Certain conditions in which the body's disease-fighting (immune) system attacks itself (autoimmune disorders).  Being overweight or obese.  Having an  inactive (sedentary) lifestyle.  Having been diagnosed with insulin resistance.  Having a history of prediabetes, gestational diabetes, or polycystic ovarian syndrome (PCOS).  Being of American-Indian, African-American, Hispanic/Latino, or Asian/Pacific Islander descent.  What are the signs or symptoms? Hyperglycemia may not cause any symptoms. If you do have symptoms, they may include early warning signs, such as:  Increased thirst.  Hunger.  Feeling very tired.  Needing to urinate more often than usual.  Blurry vision.  Other symptoms may develop if hyperglycemia gets worse, such as:  Dry mouth.  Loss of appetite.  Fruity-smelling breath.  Weakness.  Unexpected or rapid weight gain or weight loss.  Tingling or numbness in the hands or feet.  Headache.  Skin that does not quickly return to normal after being lightly pinched and released (poor skin turgor).  Abdominal pain.  Cuts or bruises that are slow to heal.  How is this diagnosed? Hyperglycemia is diagnosed with a blood test to measure your blood glucose level. This blood test is usually done while you are having symptoms. Your health care provider may also do a physical exam and review your medical history. You may have more tests to determine the cause of your hyperglycemia, such as:  A fasting blood glucose (FBG) test. You will not be allowed to eat (you will fast) for at least 8 hours before a blood sample is taken.  An A1c (hemoglobin A1c) blood test. This provides information about blood glucose control over the previous 2-3 months.  An oral glucose tolerance test (OGTT). This measures your blood glucose at two times: ? After fasting. This is your baseline blood glucose level. ? Two hours after drinking a beverage that contains glucose.  How is   this treated? Treatment depends on the cause of your hyperglycemia. Treatment may include:  Taking medicine to regulate your blood glucose levels. If you  take insulin or other diabetes medicines, your medicine or dosage may be adjusted.  Lifestyle changes, such as exercising more, eating healthier foods, or losing weight.  Treating an illness or infection, if this caused your hyperglycemia.  Checking your blood glucose more often.  Stopping or reducing steroid medicines, if these caused your hyperglycemia.  If your hyperglycemia becomes severe and it results in hyperglycemic hyperosmolar state, you must be hospitalized and given IV fluids. Follow these instructions at home: General instructions  Take over-the-counter and prescription medicines only as told by your health care provider.  Do not use any products that contain nicotine or tobacco, such as cigarettes and e-cigarettes. If you need help quitting, ask your health care provider.  Limit alcohol intake to no more than 1 drink per day for nonpregnant women and 2 drinks per day for men. One drink equals 12 oz of beer, 5 oz of wine, or 1 oz of hard liquor.  Learn to manage stress. If you need help with this, ask your health care provider.  Keep all follow-up visits as told by your health care provider. This is important. Eating and drinking  Maintain a healthy weight.  Exercise regularly, as directed by your health care provider.  Stay hydrated, especially when you exercise, get sick, or spend time in hot temperatures.  Eat healthy foods, such as: ? Lean proteins. ? Complex carbohydrates. ? Fresh fruits and vegetables. ? Low-fat dairy products. ? Healthy fats.  Drink enough fluid to keep your urine clear or pale yellow. If you have diabetes:   Make sure you know the symptoms of hyperglycemia.  Follow your diabetes management plan, as told by your health care provider. Make sure you: ? Take your insulin and medicines as directed. ? Follow your exercise plan. ? Follow your meal plan. Eat on time, and do not skip meals. ? Check your blood glucose as often as directed.  Make sure to check your blood glucose before and after exercise. If you exercise longer or in a different way than usual, check your blood glucose more often. ? Follow your sick day plan whenever you cannot eat or drink normally. Make this plan in advance with your health care provider.  Share your diabetes management plan with people in your workplace, school, and household.  Check your urine for ketones when you are ill and as told by your health care provider.  Carry a medical alert card or wear medical alert jewelry. Contact a health care provider if:  Your blood glucose is at or above 240 mg/dL (13.3 mmol/L) for 2 days in a row.  You have problems keeping your blood glucose in your target range.  You have frequent episodes of hyperglycemia. Get help right away if:  You have difficulty breathing.  You have a change in how you think, feel, or act (mental status).  You have nausea or vomiting that does not go away. These symptoms may represent a serious problem that is an emergency. Do not wait to see if the symptoms will go away. Get medical help right away. Call your local emergency services (911 in the [place] Do not drive yourself to the hospital. Summary  Hyperglycemia occurs when the level of sugar (glucose) in the blood is too high.  Hyperglycemia is diagnosed with a blood test to measure your blood glucose level. This blood   test is usually done while you are having symptoms. Your health care provider may also do a physical exam and review your medical history.  If you have diabetes, follow your diabetes management plan as told by your health care provider.  Contact your health care provider if you have problems keeping your blood glucose in your target range. This information is not intended to replace advice given to you by your health care provider. Make sure you discuss any questions you have with your health care provider. Document Released: 02/04/2001 Document Revised:  04/28/2016 Document Reviewed: 04/28/2016 Elsevier Interactive Patient Education  2018 Elsevier Inc[employer]  

## 2017-10-01 NOTE — Progress Notes (Signed)
Subjective:  I acted as a Education administrator for [employer], RMA   Patient ID: Katie [name], female    DOB: 25-Mar-1966, 52 y.o.   MRN: [id number]  Chief Complaint  Patient presents with  . lab results review    HPI  Patient is in today for follow up on lab results    Pt feels the lipitor is the cause of her hgba1c being elevated --  She would like to change the lipitor and recheck the labs.   No other complaints.   Patient Care Team: Carollee Herter, Alferd Apa, DO as PCP - General   Past Medical History:  Diagnosis Date  . Allergy   . Anemia   . Anxiety   . Blood transfusion without reported diagnosis   . Hyperlipidemia   . Migraine   . PVC (premature ventricular contraction)   . RLS (restless legs syndrome)   . Thyroid disease     Past Surgical History:  Procedure Laterality Date  . BUNIONECTOMY  06/04/2009   Right foot  . CARPAL TUNNEL RELEASE     Bilateral  . CRYOABLATION  11/04  . ECTOPIC PREGNANCY SURGERY  1998  . ENDOMETRIAL ABLATION    . SEPTOPLASTY  6/05  . TUBAL LIGATION  04/1999    Family History  Problem Relation Age of Onset  . Depression Father        Suicide  . Aneurysm Mother        Brain  . Hypertension Sister   . Thyroid disease Sister   . Diabetes Maternal Grandmother   . Alzheimer's disease Maternal Grandmother   . Uterine cancer Maternal Grandmother   . Hypertension Maternal Grandmother   . Osteoporosis Maternal Grandmother   . Cancer Maternal Grandmother        uterine  . Lung cancer Maternal Grandfather   . Cancer Maternal Grandfather        lung  . Cancer Paternal Grandmother        metatstatic-- ? primary colon  . Cancer Paternal Grandfather        skin, hepatic    Social History   Socioeconomic History  . Marital status: Married    Spouse name: Not on file  . Number of children: 2  . Years of education: 37  . Highest education level: Not on file  Social Needs  . Financial resource strain: Not on file  . Food insecurity -  worry: Not on file  . Food insecurity - inability: Not on file  . Transportation needs - medical: Not on file  . Transportation needs - non-medical: Not on file  Occupational History  . Occupation: scheduler [employer]  Tobacco Use  . Smoking status: Former Smoker    Packs/day: 1.00    Last attempt to quit: 01/14/1995    Years since quitting: 22.7  . Smokeless tobacco: Former Systems developer    Quit date: 08/26/1995  . Tobacco comment: started smoking at age 89 1ppd  Substance and Sexual Activity  . Alcohol use: Yes    Comment: Rare  . Drug use: No  . Sexual activity: Not Currently    Partners: Male    Comment: 1st intercourse 52 yo-More than 5 partners-BTL  Other Topics Concern  . Not on file  Social History Narrative   Exercise-- no    Outpatient Medications Prior to Visit  Medication Sig Dispense Refill  . cetirizine (ZYRTEC) 10 MG tablet Take 1 tablet (10 mg total) by mouth daily.    Marland Kitchen  desvenlafaxine (PRISTIQ) 100 MG 24 hr tablet Take 1 tablet (100 mg total) by mouth daily. 90 tablet 1  . fenofibrate 160 MG tablet Take 1 tablet (160 mg total) by mouth daily. 90 tablet 1  . levothyroxine (SYNTHROID, LEVOTHROID) 137 MCG tablet Take 1 tablet (137 mcg total) by mouth daily before breakfast. 90 tablet 1  . Norethindrone-Ethinyl Estradiol-Fe Biphas (LO LOESTRIN FE) 1 MG-10 MCG / 10 MCG tablet Take continuous as instructed to withdraw every three months 3 Package 4  . ondansetron (ZOFRAN ODT) 8 MG disintegrating tablet Take 1 tablet (8 mg total) by mouth every 8 (eight) hours as needed for nausea or vomiting. 20 tablet 2  . QUEtiapine (SEROQUEL) 50 MG tablet Take 1 tablet (50 mg total) by mouth at bedtime. 30 tablet 3  . valACYclovir (VALTREX) 1000 MG tablet Take 1 tablet (1,000 mg total) by mouth 3 (three) times daily. 30 tablet 2  . atorvastatin (LIPITOR) 10 MG tablet Take 1 tablet (10 mg total) by mouth daily. 90 tablet 1   No facility-administered medications prior to visit.      Allergies  Allergen Reactions  . Morphine     Nausea & vomiting    Review of Systems  Constitutional: Negative for fever and malaise/fatigue.  HENT: Negative for congestion.   Eyes: Negative for blurred vision.  Respiratory: Negative for cough and shortness of breath.   Cardiovascular: Negative for chest pain, palpitations and leg swelling.  Gastrointestinal: Negative for vomiting.  Musculoskeletal: Negative for back pain.  Skin: Negative for rash.  Neurological: Negative for loss of consciousness and headaches.       Objective:    Physical Exam  Constitutional: She is oriented to person, place, and time. She appears well-developed and well-nourished.  HENT:  Head: Normocephalic and atraumatic.  Eyes: Conjunctivae and EOM are normal.  Neck: Normal range of motion. Neck supple. No JVD present. Carotid bruit is not present. No thyromegaly present.  Cardiovascular: Normal rate, regular rhythm and normal heart sounds.  No murmur heard. Pulmonary/Chest: Effort normal and breath sounds normal. No respiratory distress. She has no wheezes. She has no rales. She exhibits no tenderness.  Musculoskeletal: She exhibits no edema.  Neurological: She is alert and oriented to person, place, and time.  Psychiatric: She has a normal mood and affect.  Nursing note and vitals reviewed.   BP 130/82 (BP Location: Left Arm, Patient Position: Sitting, Cuff Size: Normal)   Pulse 87   Temp 98.2 F (36.8 C) (Oral)   Resp 18   Ht 5\' 4"  (1.626 m)   Wt 181 lb (82.1 kg)   SpO2 98%   BMI 31.07 kg/m  Wt Readings from Last 3 Encounters:  10/01/17 181 lb (82.1 kg)  09/17/17 180 lb 6.4 oz (81.8 kg)  04/20/17 181 lb 6.4 oz (82.3 kg)   BP Readings from Last 3 Encounters:  10/01/17 130/82  09/17/17 128/86  04/20/17 124/66     Immunization History  Administered Date(s) Administered  . Influenza Whole 06/28/2008, 08/08/2009  . Influenza,inj,Quad PF,6+ Mos 07/28/2014  .  Influenza-Unspecified 06/12/2016  . Td 06/20/2008  . Tdap 06/20/2008    Health Maintenance  Topic Date Due  . PNEUMOCOCCAL POLYSACCHARIDE VACCINE (1) 04/24/1970  . FOOT EXAM  04/24/1978  . OPHTHALMOLOGY EXAM  04/24/1978  . URINE MICROALBUMIN  04/24/1978  . HIV Screening  04/25/1983  . MAMMOGRAM  11/11/2017  . HEMOGLOBIN A1C  03/17/2018  . PAP SMEAR  04/16/2018  . TETANUS/TDAP  06/20/2018  .  INFLUENZA VACCINE  Completed    Lab Results  Component Value Date   WBC 4.2 04/01/2017   HGB 12.8 04/01/2017   HCT 38.5 04/01/2017   PLT 295.0 04/01/2017   GLUCOSE 102 (H) 09/17/2017   CHOL 158 09/17/2017   TRIG 149.0 09/17/2017   HDL 43.60 09/17/2017   LDLDIRECT 138.3 06/20/2008   LDLCALC 84 09/17/2017   ALT 15 09/17/2017   AST 16 09/17/2017   NA 136 09/17/2017   K 4.1 09/17/2017   CL 101 09/17/2017   CREATININE 0.75 09/17/2017   BUN 18 09/17/2017   CO2 25 09/17/2017   TSH 0.36 04/01/2017   HGBA1C 6.2 09/17/2017    Lab Results  Component Value Date   TSH 0.36 04/01/2017   Lab Results  Component Value Date   WBC 4.2 04/01/2017   HGB 12.8 04/01/2017   HCT 38.5 04/01/2017   MCV 88.9 04/01/2017   PLT 295.0 04/01/2017   Lab Results  Component Value Date   NA 136 09/17/2017   K 4.1 09/17/2017   CO2 25 09/17/2017   GLUCOSE 102 (H) 09/17/2017   BUN 18 09/17/2017   CREATININE 0.75 09/17/2017   BILITOT 0.5 09/17/2017   ALKPHOS 51 09/17/2017   AST 16 09/17/2017   ALT 15 09/17/2017   PROT 7.8 09/17/2017   ALBUMIN 4.3 09/17/2017   CALCIUM 9.3 09/17/2017   GFR 87.15 09/17/2017   Lab Results  Component Value Date   CHOL 158 09/17/2017   Lab Results  Component Value Date   HDL 43.60 09/17/2017   Lab Results  Component Value Date   LDLCALC 84 09/17/2017   Lab Results  Component Value Date   TRIG 149.0 09/17/2017   Lab Results  Component Value Date   CHOLHDL 4 09/17/2017   Lab Results  Component Value Date   HGBA1C 6.2 09/17/2017         Assessment &  Plan:   Problem List Items Addressed This Visit      Unprioritized   Hyperglycemia    Pt was upset by dm dx and wants to change the statin and recheck labs  Recheck 3 months      Relevant Orders   Lipid panel   Hemoglobin A1c   Comprehensive metabolic panel   Hyperlipidemia LDL goal <100 - Primary    Pt concerned about lipitor causing her sugar to be high Change statin and recheck labs In 3 months      Relevant Medications   rosuvastatin (CRESTOR) 10 MG tablet   Other Relevant Orders   Lipid panel   Hemoglobin A1c   Comprehensive metabolic panel   Type 2 diabetes mellitus with hyperglycemia, without long-term current use of insulin (HCC)   Relevant Medications   rosuvastatin (CRESTOR) 10 MG tablet      I have discontinued Undra Antrobus's atorvastatin. I am also having her start on rosuvastatin. Additionally, I am having her maintain her ondansetron, cetirizine, valACYclovir, Norethindrone-Ethinyl Estradiol-Fe Biphas, QUEtiapine, levothyroxine, fenofibrate, and desvenlafaxine.  Meds ordered this encounter  Medications  . rosuvastatin (CRESTOR) 10 MG tablet    Sig: Take 1 tablet (10 mg total) by mouth daily.    Dispense:  30 tablet    Refill:  2    CMA served as scribe during this visit. History, Physical and Plan performed by medical provider. Documentation and orders reviewed and attested to.  [name], DO

## 2017-10-01 NOTE — Assessment & Plan Note (Signed)
Pt concerned about lipitor causing her sugar to be high Change statin and recheck labs In 3 months

## 2017-10-01 NOTE — Assessment & Plan Note (Signed)
Pt was upset by dm dx and wants to change the statin and recheck labs  Recheck 3 months

## 2017-10-21 ENCOUNTER — Other Ambulatory Visit: Payer: Self-pay | Admitting: *Deleted

## 2017-10-21 MED ORDER — NORETHIN-ETH ESTRAD-FE BIPHAS 1 MG-10 MCG / 10 MCG PO TABS: ORAL_TABLET | ORAL | 0 refills | Status: DC

## 2017-11-03 ENCOUNTER — Other Ambulatory Visit: Payer: Self-pay | Admitting: Gynecology

## 2017-11-10 ENCOUNTER — Other Ambulatory Visit: Payer: Self-pay | Admitting: Gynecology

## 2017-11-11 ENCOUNTER — Other Ambulatory Visit: Payer: Self-pay | Admitting: Gynecology

## 2017-11-12 ENCOUNTER — Other Ambulatory Visit: Payer: Self-pay | Admitting: Gynecology

## 2017-12-16 ENCOUNTER — Other Ambulatory Visit (INDEPENDENT_AMBULATORY_CARE_PROVIDER_SITE_OTHER): Payer: BLUE CROSS/BLUE SHIELD

## 2017-12-16 DIAGNOSIS — R739 Hyperglycemia, unspecified: Secondary | ICD-10-CM

## 2017-12-16 DIAGNOSIS — E785 Hyperlipidemia, unspecified: Secondary | ICD-10-CM | POA: Diagnosis not present

## 2017-12-16 LAB — LIPID PANEL
Cholesterol: 144 mg/dL (ref 0–200)
HDL: 45.7 mg/dL (ref >39.00)
LDL Cholesterol: 69 mg/dL (ref 0–99)
NonHDL: 98.5
Total CHOL/HDL Ratio: 3
Triglycerides: 149 mg/dL (ref 0.0–149.0)
VLDL: 29.8 mg/dL (ref 0.0–40.0)

## 2017-12-16 LAB — COMPREHENSIVE METABOLIC PANEL
ALT: 13 U/L (ref 0–35)
AST: 17 U/L (ref 0–37)
Albumin: 4.2 g/dL (ref 3.5–5.2)
Alkaline Phosphatase: 46 U/L (ref 39–117)
BUN: 18 mg/dL (ref 6–23)
CO2: 27 mEq/L (ref 19–32)
Calcium: 9.5 mg/dL (ref 8.4–10.5)
Chloride: 103 mEq/L (ref 96–112)
Creatinine, Ser: 0.8 mg/dL (ref 0.40–1.20)
GFR: 80.81 mL/min (ref >60.00)
Glucose, Bld: 107 mg/dL — ABNORMAL HIGH (ref 70–99)
Potassium: 4.3 mEq/L (ref 3.5–5.1)
Sodium: 137 mEq/L (ref 135–145)
Total Bilirubin: 0.4 mg/dL (ref 0.2–1.2)
Total Protein: 7.7 g/dL (ref 6.0–8.3)

## 2017-12-16 LAB — HEMOGLOBIN A1C: Hgb A1c MFr Bld: 6.1 % (ref 4.6–6.5)

## 2017-12-18 ENCOUNTER — Encounter: Payer: Self-pay | Admitting: Family Medicine

## 2017-12-28 ENCOUNTER — Other Ambulatory Visit: Payer: Self-pay | Admitting: Family Medicine

## 2017-12-28 DIAGNOSIS — E785 Hyperlipidemia, unspecified: Secondary | ICD-10-CM

## 2017-12-29 ENCOUNTER — Encounter: Payer: Self-pay | Admitting: Family Medicine

## 2017-12-29 DIAGNOSIS — E785 Hyperlipidemia, unspecified: Secondary | ICD-10-CM

## 2017-12-30 MED ORDER — ROSUVASTATIN CALCIUM 10 MG PO TABS: 10.0000 mg | ORAL_TABLET | Freq: Every day | ORAL | 1 refills | Status: DC

## 2018-01-26 ENCOUNTER — Encounter: Payer: Self-pay | Admitting: Women's Health

## 2018-01-26 ENCOUNTER — Ambulatory Visit (INDEPENDENT_AMBULATORY_CARE_PROVIDER_SITE_OTHER): Payer: BLUE CROSS/BLUE SHIELD | Admitting: Women's Health

## 2018-01-26 VITALS — BP 123/82 | Ht 64.0 in | Wt 176.4 lb

## 2018-01-26 DIAGNOSIS — Z01419 Encounter for gynecological examination (general) (routine) without abnormal findings: Secondary | ICD-10-CM

## 2018-01-26 DIAGNOSIS — B001 Herpesviral vesicular dermatitis: Secondary | ICD-10-CM | POA: Diagnosis not present

## 2018-01-26 DIAGNOSIS — Z23 Encounter for immunization: Secondary | ICD-10-CM | POA: Diagnosis not present

## 2018-01-26 MED ORDER — VALACYCLOVIR HCL 1 G PO TABS: 1000.0000 mg | ORAL_TABLET | Freq: Three times a day (TID) | ORAL | 2 refills | Status: DC

## 2018-01-26 MED ORDER — NORETHIN ACE-ETH ESTRAD-FE 1-20 MG-MCG PO TABS: 1.0000 | ORAL_TABLET | Freq: Every day | ORAL | 4 refills | Status: DC

## 2018-01-26 NOTE — Addendum Note (Signed)
Addended by: Lorine Bears on: 01/26/2018 08:59 AM   Modules accepted: Orders

## 2018-01-26 NOTE — Progress Notes (Signed)
Katie [name] 07/08/1966 [id number]    History:    Presents for annual exam.  Light cycle every 3 months on lo Loestrin, states low Loestrin now $150 a month and would like something less expensive.  History of an endometrial ablation, BTL.  But had severe nausea with cycles, migraines without aura.  Normal Pap and mammogram history. Primary care manages hypothyroidism, , hypercholesteremia and anxiety and depression.  History of HSV 1 rare outbreaks.    Past medical history, past surgical history, family history and social history were all reviewed and documented in the EPIC chart.  Desk job.  2 children and 7 grandchildren.   ROS:  A ROS was performed and pertinent positives and negatives are included.  Exam:  Vitals:   01/26/18 0811  BP: 123/82  Weight: 176 lb 6.4 oz (80 kg)  Height: 5\' 4"  (1.626 m)   Body mass index is 30.28 kg/m.   General appearance:  Normal Thyroid:  Symmetrical, normal in size, without palpable masses or nodularity. Respiratory  Auscultation:  Clear without wheezing or rhonchi Cardiovascular  Auscultation:  Regular rate, without rubs, murmurs or gallops  Edema/varicosities:  Not grossly evident Abdominal  Soft,nontender, without masses, guarding or rebound.  Liver/spleen:  No organomegaly noted  Hernia:  None appreciated  Skin  Inspection:  Grossly normal   Breasts: Examined lying and sitting.     Right: Without masses, retractions, discharge or axillary adenopathy.     Left: Without masses, retractions, discharge or axillary adenopathy. Gentitourinary   Inguinal/mons:  Normal without inguinal adenopathy  External genitalia:  Normal  BUS/Urethra/Skene's glands:  Normal  Vagina:  Normal  Cervix:  Normal  Uterus:  normal in size, shape and contour.  Midline and mobile  Adnexa/parametria:     Rt: Without masses or tenderness.   Lt: Without masses or tenderness.  Anus and perineum: Normal  Digital rectal exam: Normal sphincter tone without palpated  masses or tenderness  Assessment/Plan:  52 y.o. MWF G4 P2 for annual exam with no complaints.  Light cycle every 3 months on continuous lo Loestrin History of migraines without aura and nausea with cycles.   Hypothyroid, hypercholesteremia, depression -primary care manages labs and meds HSV 1 rare outbreaks.  Plan: Options reviewed.  Will try Loestrin 1/20 prescription, proper use given and reviewed, instructed to call if increased headaches or problems.  Reviewed slight risk of blood clots, strokes.  SBE's, continue annual screening mammogram, calcium rich foods, vitamin D 1000 daily encouraged.  Encouraged to continue with weight loss, has lost 8 pounds, decrease calorie/carbs/fats in diet.  Pap with HR HPV typing, new screening guidelines reviewed.  Tdap given.    [place], 8:46 AM 01/26/2018

## 2018-01-26 NOTE — Patient Instructions (Signed)

## 2018-01-26 NOTE — Addendum Note (Signed)
Addended by: Thamas Jaegers on: 01/26/2018 08:57 AM   Modules accepted: Orders

## 2018-01-27 LAB — PAP, TP IMAGING W/ HPV RNA, RFLX HPV TYPE 16,18/45: HPV DNA High Risk: NOT DETECTED

## 2018-01-28 ENCOUNTER — Other Ambulatory Visit: Payer: Self-pay | Admitting: Family Medicine

## 2018-01-28 ENCOUNTER — Encounter: Payer: Self-pay | Admitting: Family Medicine

## 2018-01-28 DIAGNOSIS — G4700 Insomnia, unspecified: Secondary | ICD-10-CM

## 2018-03-18 ENCOUNTER — Ambulatory Visit (INDEPENDENT_AMBULATORY_CARE_PROVIDER_SITE_OTHER): Payer: BLUE CROSS/BLUE SHIELD | Admitting: Family Medicine

## 2018-03-18 ENCOUNTER — Encounter: Payer: Self-pay | Admitting: Family Medicine

## 2018-03-18 VITALS — BP 146/77 | HR 75 | Temp 98.5°F | Resp 16 | Ht 64.0 in | Wt 181.2 lb

## 2018-03-18 DIAGNOSIS — Z0000 Encounter for general adult medical examination without abnormal findings: Secondary | ICD-10-CM

## 2018-03-18 DIAGNOSIS — D229 Melanocytic nevi, unspecified: Secondary | ICD-10-CM

## 2018-03-18 DIAGNOSIS — Z23 Encounter for immunization: Secondary | ICD-10-CM

## 2018-03-18 DIAGNOSIS — E785 Hyperlipidemia, unspecified: Secondary | ICD-10-CM | POA: Diagnosis not present

## 2018-03-18 DIAGNOSIS — E1169 Type 2 diabetes mellitus with other specified complication: Secondary | ICD-10-CM | POA: Diagnosis not present

## 2018-03-18 DIAGNOSIS — E039 Hypothyroidism, unspecified: Secondary | ICD-10-CM

## 2018-03-18 DIAGNOSIS — E119 Type 2 diabetes mellitus without complications: Secondary | ICD-10-CM | POA: Diagnosis not present

## 2018-03-18 NOTE — Patient Instructions (Signed)
Preventive Care 40-64 Years, Female Preventive care refers to lifestyle choices and visits with your health care provider that can promote health and wellness. What does preventive care include?  A yearly physical exam. This is also called an annual well check.  Dental exams once or twice a year.  Routine eye exams. Ask your health care provider how often you should have your eyes checked.  Personal lifestyle choices, including: ? Daily care of your teeth and gums. ? Regular physical activity. ? Eating a healthy diet. ? Avoiding tobacco and drug use. ? Limiting alcohol use. ? Practicing safe sex. ? Taking low-dose aspirin daily starting at age 58. ? Taking vitamin and mineral supplements as recommended by your health care provider. What happens during an annual well check? The services and screenings done by your health care provider during your annual well check will depend on your age, overall health, lifestyle risk factors, and family history of disease. Counseling Your health care provider may ask you questions about your:  Alcohol use.  Tobacco use.  Drug use.  Emotional well-being.  Home and relationship well-being.  Sexual activity.  Eating habits.  Work and work Statistician.  Method of birth control.  Menstrual cycle.  Pregnancy history.  Screening You may have the following tests or measurements:  Height, weight, and BMI.  Blood pressure.  Lipid and cholesterol levels. These may be checked every 5 years, or more frequently if you are over 81 years old.  Skin check.  Lung cancer screening. You may have this screening every year starting at age 78 if you have a 30-pack-year history of smoking and currently smoke or have quit within the past 15 years.  Fecal occult blood test (FOBT) of the stool. You may have this test every year starting at age 65.  Flexible sigmoidoscopy or colonoscopy. You may have a sigmoidoscopy every 5 years or a colonoscopy  every 10 years starting at age 30.  Hepatitis C blood test.  Hepatitis B blood test.  Sexually transmitted disease (STD) testing.  Diabetes screening. This is done by checking your blood sugar (glucose) after you have not eaten for a while (fasting). You may have this done every 1-3 years.  Mammogram. This may be done every 1-2 years. Talk to your health care provider about when you should start having regular mammograms. This may depend on whether you have a family history of breast cancer.  BRCA-related cancer screening. This may be done if you have a family history of breast, ovarian, tubal, or peritoneal cancers.  Pelvic exam and Pap test. This may be done every 3 years starting at age 80. Starting at age 36, this may be done every 5 years if you have a Pap test in combination with an HPV test.  Bone density scan. This is done to screen for osteoporosis. You may have this scan if you are at high risk for osteoporosis.  Discuss your test results, treatment options, and if necessary, the need for more tests with your health care provider. Vaccines Your health care provider may recommend certain vaccines, such as:  Influenza vaccine. This is recommended every year.  Tetanus, diphtheria, and acellular pertussis (Tdap, Td) vaccine. You may need a Td booster every 10 years.  Varicella vaccine. You may need this if you have not been vaccinated.  Zoster vaccine. You may need this after age 5.  Measles, mumps, and rubella (MMR) vaccine. You may need at least one dose of MMR if you were born in  1957 or later. You may also need a second dose.  Pneumococcal 13-valent conjugate (PCV13) vaccine. You may need this if you have certain conditions and were not previously vaccinated.  Pneumococcal polysaccharide (PPSV23) vaccine. You may need one or two doses if you smoke cigarettes or if you have certain conditions.  Meningococcal vaccine. You may need this if you have certain  conditions.  Hepatitis A vaccine. You may need this if you have certain conditions or if you travel or work in places where you may be exposed to hepatitis A.  Hepatitis B vaccine. You may need this if you have certain conditions or if you travel or work in places where you may be exposed to hepatitis B.  Haemophilus influenzae type b (Hib) vaccine. You may need this if you have certain conditions.  Talk to your health care provider about which screenings and vaccines you need and how often you need them. This information is not intended to replace advice given to you by your health care provider. Make sure you discuss any questions you have with your health care provider. Document Released: 09/07/2015 Document Revised: 04/30/2016 Document Reviewed: 06/12/2015 Elsevier Interactive Patient Education  2018 Elsevier Inc.  

## 2018-03-18 NOTE — Progress Notes (Signed)
Subjective:     Katie [name] is a 52 y.o. female and is here for a comprehensive physical exam. The patient reports no problems.  Social History   Socioeconomic History  . Marital status: Married    Spouse name: Not on file  . Number of children: 2  . Years of education: 62  . Highest education level: Not on file  Occupational History  . Occupation: Marketing executive [employer]  Social Needs  . Financial resource strain: Not on file  . Food insecurity:    Worry: Not on file    Inability: Not on file  . Transportation needs:    Medical: Not on file    Non-medical: Not on file  Tobacco Use  . Smoking status: Former Smoker    Packs/day: 1.00    Last attempt to quit: 01/14/1995    Years since quitting: 23.1  . Smokeless tobacco: Former Systems developer    Quit date: 08/26/1995  . Tobacco comment: started smoking at age 65 1ppd  Substance and Sexual Activity  . Alcohol use: Yes    Comment: Rare  . Drug use: No  . Sexual activity: Not Currently    Partners: Male    Comment: 1st intercourse 52 yo-More than 5 partners-BTL  Lifestyle  . Physical activity:    Days per week: Not on file    Minutes per session: Not on file  . Stress: Not on file  Relationships  . Social connections:    Talks on phone: Not on file    Gets together: Not on file    Attends religious service: Not on file    Active member of club or organization: Not on file    Attends meetings of clubs or organizations: Not on file    Relationship status: Not on file  . Intimate partner violence:    Fear of current or ex partner: Not on file    Emotionally abused: Not on file    Physically abused: Not on file    Forced sexual activity: Not on file  Other Topics Concern  . Not on file  Social History Narrative   Exercise-- no   Health Maintenance  Topic Date Due  . PNEUMOCOCCAL POLYSACCHARIDE VACCINE (1) 04/24/1970  . OPHTHALMOLOGY EXAM  04/24/1978  . URINE MICROALBUMIN  04/24/1978  . MAMMOGRAM  11/11/2017  . HIV  Screening  03/18/2024 (Originally 04/25/1983)  . INFLUENZA VACCINE  03/25/2018  . HEMOGLOBIN A1C  06/17/2018  . FOOT EXAM  03/19/2019  . PAP SMEAR  01/26/2021  . TETANUS/TDAP  01/27/2028    The following portions of the patient's history were reviewed and updated as appropriate:  She  has a past medical history of Allergy, Anemia, Anxiety, Blood transfusion without reported diagnosis, Hyperlipidemia, Migraine, PVC (premature ventricular contraction), RLS (restless legs syndrome), and Thyroid disease. She does not have any pertinent problems on file. She  has a past surgical history that includes Tubal ligation (04/1999); Carpal tunnel release; Septoplasty (6/05); Cryoablation (11/04); Ectopic pregnancy surgery (1998); Bunionectomy (06/04/2009); and Endometrial ablation. Her family history includes Alzheimer's disease in her maternal grandmother; Aneurysm in her mother; Cancer in her maternal grandfather, maternal grandmother, paternal grandfather, and paternal grandmother; Depression in her father; Diabetes in her maternal grandmother; Hypertension in her maternal grandmother and sister; Lung cancer in her maternal grandfather; Osteoporosis in her maternal grandmother; Thyroid disease in her sister; Uterine cancer in her maternal grandmother. She  reports that she quit smoking about 23 years ago. She smoked 1.00 pack per  day. She quit smokeless tobacco use about 22 years ago. She reports that she drinks alcohol. She reports that she does not use drugs. She has a current medication list which includes the following prescription(s): cetirizine, desvenlafaxine, fenofibrate, levothyroxine, norethindrone-ethinyl estradiol, ondansetron, quetiapine, rosuvastatin, and valacyclovir. Current Outpatient Medications on File Prior to Visit  Medication Sig Dispense Refill  . cetirizine (ZYRTEC) 10 MG tablet Take 1 tablet (10 mg total) by mouth daily.    Marland Kitchen desvenlafaxine (PRISTIQ) 100 MG 24 hr tablet Take 1 tablet  (100 mg total) by mouth daily. 90 tablet 1  . fenofibrate 160 MG tablet Take 1 tablet (160 mg total) by mouth daily. 90 tablet 1  . levothyroxine (SYNTHROID, LEVOTHROID) 137 MCG tablet Take 1 tablet (137 mcg total) by mouth daily before breakfast. 90 tablet 1  . norethindrone-ethinyl estradiol (JUNEL FE,GILDESS FE,LOESTRIN FE) 1-20 MG-MCG tablet Take 1 tablet by mouth daily. 3 Package 4  . ondansetron (ZOFRAN ODT) 8 MG disintegrating tablet Take 1 tablet (8 mg total) by mouth every 8 (eight) hours as needed for nausea or vomiting. 20 tablet 2  . QUEtiapine (SEROQUEL) 50 MG tablet TAKE 1 TABLET BY MOUTH EVERY NIGHT AT BEDTIME 30 tablet 3  . rosuvastatin (CRESTOR) 10 MG tablet Take 1 tablet (10 mg total) by mouth daily. 90 tablet 1  . valACYclovir (VALTREX) 1000 MG tablet Take 1 tablet (1,000 mg total) by mouth 3 (three) times daily. 30 tablet 2   No current facility-administered medications on file prior to visit.    She is allergic to morphine..  Review of Systems Review of Systems  Constitutional: Negative for activity change, appetite change and fatigue.  HENT: Negative for hearing loss, congestion, tinnitus and ear discharge.  dentist q18m Eyes: Negative for visual disturbance (see optho q1y -- vision corrected to 20/20 with glasses).  Respiratory: Negative for cough, chest tightness and shortness of breath.   Cardiovascular: Negative for chest pain, palpitations and leg swelling.  Gastrointestinal: Negative for abdominal pain, diarrhea, constipation and abdominal distention.  Genitourinary: Negative for urgency, frequency, decreased urine volume and difficulty urinating.  Musculoskeletal: Negative for back pain, arthralgias and gait problem.  Skin: Negative for color change, pallor and rash.  Neurological: Negative for dizziness, light-headedness, numbness and headaches.  Hematological: Negative for adenopathy. Does not bruise/bleed easily.  Psychiatric/Behavioral: Negative for suicidal  ideas, confusion, sleep disturbance, self-injury, dysphoric mood, decreased concentration and agitation.       Objective:    BP (!) 146/77 (BP Location: Left Arm, Cuff Size: Large)   Pulse 75   Temp 98.5 F (36.9 C) (Oral)   Resp 16   Ht 5\' 4"  (1.626 m)   Wt 181 lb 3.2 oz (82.2 kg)   LMP 02/26/2018   SpO2 99%   BMI 31.10 kg/m  General appearance: alert, cooperative, appears stated age and no distress Head: Normocephalic, without obvious abnormality, atraumatic Eyes: conjunctivae/corneas clear. PERRL, EOM's intact. Fundi benign. Ears: normal TM's and external ear canals both ears Nose: Nares normal. Septum midline. Mucosa normal. No drainage or sinus tenderness. Throat: lips, mucosa, and tongue normal; teeth and gums normal Neck: no adenopathy, no carotid bruit, no JVD, supple, symmetrical, trachea midline and thyroid not enlarged, symmetric, no tenderness/mass/nodules Back: symmetric, no curvature. ROM normal. No CVA tenderness. Lungs: clear to auscultation bilaterally Breasts: gyn Heart: regular rate and rhythm, S1, S2 normal, no murmur, click, rub or gallop Abdomen: soft, non-tender; bowel sounds normal; no masses,  no organomegaly Pelvic: deferred--gyn Extremities: extremities normal, atraumatic,  no cyanosis or edema Pulses: 2+ and symmetric Skin: Skin color, texture, turgor normal. No rashes or lesions Lymph nodes: Cervical, supraclavicular, and axillary nodes normal. Neurologic: Alert and oriented X 3, normal strength and tone. Normal symmetric reflexes. Normal coordination and gait    Diabetic Foot Exam - Simple   Simple Foot Form Diabetic Foot exam was performed with the following findings:  Yes 03/18/2018  3:03 PM  Visual Inspection No deformities, no ulcerations, no other skin breakdown bilaterally:  Yes Sensation Testing Intact to touch and monofilament testing bilaterally:  Yes Pulse Check Posterior Tibialis and Dorsalis pulse intact bilaterally:   Yes Comments     Assessment:    Healthy female exam.      Plan:     ghm utd Check labs  See After Visit Summary for Counseling Recommendations    1. Preventative health care See above   2. Suspicious nevus   - Ambulatory referral to Dermatology  3. Hypothyroidism, unspecified type   - TSH  4. Hyperlipidemia associated with type 2 diabetes mellitus (HCC)   - Lipid panel - CBC with Differential/Platelet - Comprehensive metabolic panel  5. Diet-controlled diabetes mellitus ([place])   - CBC with Differential/Platelet - Hemoglobin A1c - Comprehensive metabolic panel - Microalbumin / creatinine urine ratio  6. Need for pneumococcal vaccination   - Pneumococcal polysaccharide vaccine 23-valent greater than or equal to 2yo subcutaneous/IM

## 2018-03-19 LAB — CBC WITH DIFFERENTIAL/PLATELET
Basophils Absolute: 0 10*3/uL (ref 0.0–0.1)
Basophils Relative: 0.7 % (ref 0.0–3.0)
Eosinophils Absolute: 0.2 10*3/uL (ref 0.0–0.7)
Eosinophils Relative: 2.6 % (ref 0.0–5.0)
HCT: 35 % — ABNORMAL LOW (ref 36.0–46.0)
Hemoglobin: 11.2 g/dL — ABNORMAL LOW (ref 12.0–15.0)
Lymphocytes Relative: 38.6 % (ref 12.0–46.0)
Lymphs Abs: 2.5 10*3/uL (ref 0.7–4.0)
MCHC: 32 g/dL (ref 30.0–36.0)
MCV: 76.4 fl — ABNORMAL LOW (ref 78.0–100.0)
Monocytes Absolute: 0.5 10*3/uL (ref 0.1–1.0)
Monocytes Relative: 7.3 % (ref 3.0–12.0)
Neutro Abs: 3.3 10*3/uL (ref 1.4–7.7)
Neutrophils Relative %: 50.8 % (ref 43.0–77.0)
Platelets: 390 10*3/uL (ref 150.0–400.0)
RBC: 4.57 Mil/uL (ref 3.87–5.11)
RDW: 15.9 % — ABNORMAL HIGH (ref 11.5–15.5)
WBC: 6.4 10*3/uL (ref 4.0–10.5)

## 2018-03-19 LAB — TSH: TSH: 1.22 u[IU]/mL (ref 0.35–4.50)

## 2018-03-19 LAB — HEMOGLOBIN A1C: Hgb A1c MFr Bld: 6.1 % (ref 4.6–6.5)

## 2018-03-19 LAB — COMPREHENSIVE METABOLIC PANEL
ALT: 10 U/L (ref 0–35)
AST: 15 U/L (ref 0–37)
Albumin: 4.1 g/dL (ref 3.5–5.2)
Alkaline Phosphatase: 49 U/L (ref 39–117)
BUN: 15 mg/dL (ref 6–23)
CO2: 27 mEq/L (ref 19–32)
Calcium: 9.5 mg/dL (ref 8.4–10.5)
Chloride: 103 mEq/L (ref 96–112)
Creatinine, Ser: 0.83 mg/dL (ref 0.40–1.20)
GFR: 77.37 mL/min (ref >60.00)
Glucose, Bld: 100 mg/dL — ABNORMAL HIGH (ref 70–99)
Potassium: 4.7 mEq/L (ref 3.5–5.1)
Sodium: 138 mEq/L (ref 135–145)
Total Bilirubin: 0.3 mg/dL (ref 0.2–1.2)
Total Protein: 7.2 g/dL (ref 6.0–8.3)

## 2018-03-19 LAB — LIPID PANEL
Cholesterol: 141 mg/dL (ref 0–200)
HDL: 48.5 mg/dL (ref >39.00)
LDL Cholesterol: 61 mg/dL (ref 0–99)
NonHDL: 92.68
Total CHOL/HDL Ratio: 3
Triglycerides: 157 mg/dL — ABNORMAL HIGH (ref 0.0–149.0)
VLDL: 31.4 mg/dL (ref 0.0–40.0)

## 2018-03-19 LAB — MICROALBUMIN / CREATININE URINE RATIO
Creatinine,U: 101.5 mg/dL
Microalb Creat Ratio: 1 mg/g (ref 0.0–30.0)
Microalb, Ur: 1 mg/dL (ref 0.0–1.9)

## 2018-04-05 ENCOUNTER — Encounter: Payer: Self-pay | Admitting: Family Medicine

## 2018-04-05 DIAGNOSIS — E785 Hyperlipidemia, unspecified: Secondary | ICD-10-CM

## 2018-04-06 ENCOUNTER — Other Ambulatory Visit: Payer: Self-pay

## 2018-04-06 MED ORDER — FENOFIBRATE 160 MG PO TABS: 160.0000 mg | ORAL_TABLET | Freq: Every day | ORAL | 1 refills | Status: DC

## 2018-04-06 NOTE — Telephone Encounter (Signed)
Received fax from pharmacy requesting refill on patients fenofibrate. Patient also sent in my chart message which was addressed before this fax. Refill has been sent in 04/06/18

## 2018-04-19 ENCOUNTER — Other Ambulatory Visit: Payer: Self-pay | Admitting: Women's Health

## 2018-04-19 MED ORDER — LEVONORGESTREL-ETHINYL ESTRAD 0.1-20 MG-MCG PO TABS: ORAL_TABLET | ORAL | 3 refills | Status: DC

## 2018-04-19 NOTE — Progress Notes (Signed)
[employer]

## 2018-04-22 ENCOUNTER — Encounter: Payer: Self-pay | Admitting: Family Medicine

## 2018-04-22 DIAGNOSIS — E039 Hypothyroidism, unspecified: Secondary | ICD-10-CM

## 2018-04-22 MED ORDER — LEVOTHYROXINE SODIUM 137 MCG PO TABS: 137.0000 ug | ORAL_TABLET | Freq: Every day | ORAL | 1 refills | Status: DC

## 2018-05-22 ENCOUNTER — Encounter: Payer: Self-pay | Admitting: Family Medicine

## 2018-05-22 DIAGNOSIS — F329 Major depressive disorder, single episode, unspecified: Secondary | ICD-10-CM

## 2018-05-22 DIAGNOSIS — F32A Depression, unspecified: Secondary | ICD-10-CM

## 2018-05-24 MED ORDER — DESVENLAFAXINE SUCCINATE ER 100 MG PO TB24: 100.0000 mg | ORAL_TABLET | Freq: Every day | ORAL | 1 refills | Status: DC

## 2018-06-03 ENCOUNTER — Other Ambulatory Visit: Payer: Self-pay | Admitting: Family Medicine

## 2018-06-03 DIAGNOSIS — G4700 Insomnia, unspecified: Secondary | ICD-10-CM

## 2018-06-08 ENCOUNTER — Encounter: Payer: Self-pay | Admitting: Family Medicine

## 2018-07-15 ENCOUNTER — Telehealth: Payer: Self-pay | Admitting: Family Medicine

## 2018-07-15 NOTE — Telephone Encounter (Signed)
Copied from [place] [id number]. Topic: General - Inquiry >> Jul 15, 2018 11:46 AM Sheran Luz wrote: Reason for CRM: Patient called to schedule appointment for labs per Dr. Carollee Herter as a follow up for OV on 07/25. Per lab notes she was supposed to repeat labs but no orders are in. Please advise.

## 2018-07-16 ENCOUNTER — Other Ambulatory Visit: Payer: Self-pay

## 2018-07-16 DIAGNOSIS — E785 Hyperlipidemia, unspecified: Secondary | ICD-10-CM

## 2018-07-16 DIAGNOSIS — E119 Type 2 diabetes mellitus without complications: Secondary | ICD-10-CM

## 2018-07-16 NOTE — Telephone Encounter (Signed)
Tried calling patient, sent to voicemail. [place] for pec to schedule appointment.

## 2018-07-26 ENCOUNTER — Telehealth: Payer: Self-pay | Admitting: *Deleted

## 2018-07-26 DIAGNOSIS — Z23 Encounter for immunization: Secondary | ICD-10-CM

## 2018-07-26 NOTE — Telephone Encounter (Signed)
Copied from [place] [id number]. Topic: General - Other >> Jul 26, 2018  2:59 PM Yvette Rack wrote: Reason for CRM: pt calling to get an appt for the Shingrix injection

## 2018-07-26 NOTE — Telephone Encounter (Signed)
Ok to get

## 2018-07-26 NOTE — Telephone Encounter (Signed)
Routed to Dr. Etter Sjogren to approve order for shingrix #1.

## 2018-07-27 ENCOUNTER — Ambulatory Visit (INDEPENDENT_AMBULATORY_CARE_PROVIDER_SITE_OTHER): Payer: BLUE CROSS/BLUE SHIELD

## 2018-07-27 DIAGNOSIS — Z23 Encounter for immunization: Secondary | ICD-10-CM

## 2018-07-27 NOTE — Telephone Encounter (Signed)
Patient scheduled for Shingrix Vaccination.

## 2018-07-29 ENCOUNTER — Other Ambulatory Visit: Payer: Self-pay

## 2018-07-29 ENCOUNTER — Emergency Department (HOSPITAL_BASED_OUTPATIENT_CLINIC_OR_DEPARTMENT_OTHER): Payer: BLUE CROSS/BLUE SHIELD

## 2018-07-29 ENCOUNTER — Emergency Department (HOSPITAL_BASED_OUTPATIENT_CLINIC_OR_DEPARTMENT_OTHER)
Admission: EM | Admit: 2018-07-29 | Discharge: 2018-07-29 | Disposition: A | Discharge status: Home / Self Care | Payer: BLUE CROSS/BLUE SHIELD | Attending: Emergency Medicine | Admitting: Emergency Medicine

## 2018-07-29 ENCOUNTER — Encounter (HOSPITAL_BASED_OUTPATIENT_CLINIC_OR_DEPARTMENT_OTHER): Payer: Self-pay

## 2018-07-29 ENCOUNTER — Ambulatory Visit: Payer: Self-pay | Admitting: *Deleted

## 2018-07-29 DIAGNOSIS — F419 Anxiety disorder, unspecified: Secondary | ICD-10-CM | POA: Insufficient documentation

## 2018-07-29 DIAGNOSIS — E119 Type 2 diabetes mellitus without complications: Secondary | ICD-10-CM | POA: Insufficient documentation

## 2018-07-29 DIAGNOSIS — Y92009 Unspecified place in unspecified non-institutional (private) residence as the place of occurrence of the external cause: Secondary | ICD-10-CM | POA: Insufficient documentation

## 2018-07-29 DIAGNOSIS — S0081XA Abrasion of other part of head, initial encounter: Secondary | ICD-10-CM | POA: Diagnosis not present

## 2018-07-29 DIAGNOSIS — Z87891 Personal history of nicotine dependence: Secondary | ICD-10-CM | POA: Diagnosis not present

## 2018-07-29 DIAGNOSIS — W01198A Fall on same level from slipping, tripping and stumbling with subsequent striking against other object, initial encounter: Secondary | ICD-10-CM | POA: Insufficient documentation

## 2018-07-29 DIAGNOSIS — S0993XA Unspecified injury of face, initial encounter: Secondary | ICD-10-CM | POA: Diagnosis present

## 2018-07-29 DIAGNOSIS — Y9389 Activity, other specified: Secondary | ICD-10-CM | POA: Insufficient documentation

## 2018-07-29 DIAGNOSIS — Y998 Other external cause status: Secondary | ICD-10-CM | POA: Diagnosis not present

## 2018-07-29 DIAGNOSIS — R55 Syncope and collapse: Secondary | ICD-10-CM

## 2018-07-29 DIAGNOSIS — Z79899 Other long term (current) drug therapy: Secondary | ICD-10-CM | POA: Diagnosis not present

## 2018-07-29 LAB — URINALYSIS, ROUTINE W REFLEX MICROSCOPIC
Bilirubin Urine: NEGATIVE
Glucose, UA: NEGATIVE mg/dL
Hgb urine dipstick: NEGATIVE
Ketones, ur: NEGATIVE mg/dL
Leukocytes, UA: NEGATIVE
Nitrite: NEGATIVE
Protein, ur: NEGATIVE mg/dL
Specific Gravity, Urine: 1.025 (ref 1.005–1.030)
pH: 6 (ref 5.0–8.0)

## 2018-07-29 LAB — PREGNANCY, URINE: Preg Test, Ur: NEGATIVE

## 2018-07-29 MED ORDER — SODIUM CHLORIDE 0.9 % IV SOLN: INTRAVENOUS | Status: DC

## 2018-07-29 MED ORDER — SODIUM CHLORIDE 0.9 % IV BOLUS: 1000.0000 mL | Freq: Once | INTRAVENOUS | Status: DC

## 2018-07-29 NOTE — ED Triage Notes (Signed)
Pt states she woke to go to BR-after using BR she became hot, dizzy, nausea with ~1 min LOC-struck face on carpet )abrasion to right forehead and above top lip)-pt went back to bed-woke at 6am with similar sx-no LOC and was able to go to work-called PCP and was advised to come to ED-pt NAD-steady gait-drove self to ED

## 2018-07-29 NOTE — ED Provider Notes (Signed)
[facility] EMERGENCY DEPARTMENT Provider Note   CSN: [id number] Arrival date & time: 07/29/18  1146     History   Chief Complaint Chief Complaint  Patient presents with  . Loss of Consciousness    HPI Katie [name] is a 52 y.o. female.  Pt presents to the ED today with a syncopal episode.  The pt said she woke up around 0200 needing to urinate.  She walked to the bathroom and went.  After standing up, she felt dizzy and nauseous.  She tried to make it back to her bed, but passed out on the carpet.  She does not think she was out for long.  She then got up and went back to bed.  She woke up this morning around 0600, felt a little nauseous, but did not pass out.  She went to work and was told to call her doctor.  She called her pcp who told her to come here.  She feels better now.  She has had a cold, so took 1 mucinex last night.  She did not eat much yesterday, but has been drinking ok.  No n/v/d.  She sustained an abrasion to her forehead and upper lip, but no laceration.  She drove herself here.     Past Medical History:  Diagnosis Date  . Allergy   . Anemia   . Anxiety   . Blood transfusion without reported diagnosis   . Hyperlipidemia   . Migraine   . PVC (premature ventricular contraction)   . RLS (restless legs syndrome)   . Thyroid disease     Patient Active Problem List   Diagnosis Date Noted  . Hyperlipidemia LDL goal <100 10/01/2017  . Type 2 diabetes mellitus with hyperglycemia, without long-term current use of insulin ([place]) 10/01/2017  . Preventative health care 03/21/2017  . Hyperglycemia 03/21/2017  . Menstrual migraine without status migrainosus, not intractable 04/17/2015  . Migraine 04/10/2015  . Viral syndrome 04/20/2014  . Obesity (BMI 30-39.9) 03/14/2013  . Sinusitis 08/24/2011  . RLS (restless legs syndrome) 12/13/2009  . OBSTRUCTIVE SLEEP APNEA 12/12/2009  . OTHER ANXIETY STATES 02/01/2009  . Hyperlipidemia 01/19/2009  . HYPOKALEMIA,  MILD 01/19/2009  . DYSPEPSIA 09/28/2008  . Insomnia 07/03/2008  . PLANTAR FASCIITIS, BILATERAL 06/20/2008  . Hypothyroidism 02/22/2007  . DEVIATED NASAL SEPTUM 02/22/2007  . PREGNANCY, ECTOPIC NEC W/INTRAUTERINE PRG 02/22/2007  . HEADACHE 02/22/2007  . Other postprocedural status(V45.89) 02/22/2007    Past Surgical History:  Procedure Laterality Date  . BUNIONECTOMY  06/04/2009   Right foot  . CARPAL TUNNEL RELEASE     Bilateral  . CRYOABLATION  11/04  . ECTOPIC PREGNANCY SURGERY  1998  . ENDOMETRIAL ABLATION    . SEPTOPLASTY  6/05  . TUBAL LIGATION  04/1999     OB History    Gravida  4   Para  2   Term      Preterm      AB  1   Living  2     SAB      TAB      Ectopic  1   Multiple      Live Births               Home Medications    Prior to Admission medications   Medication Sig Start Date End Date Taking? Authorizing Provider  cetirizine (ZYRTEC) 10 MG tablet Take 1 tablet (10 mg total) by mouth daily. 01/17/16   [name],  DO  desvenlafaxine (PRISTIQ) 100 MG 24 hr tablet Take 1 tablet (100 mg total) by mouth daily. 05/24/18   Roma Schanz R, DO  fenofibrate 160 MG tablet Take 1 tablet (160 mg total) by mouth daily. 04/06/18   [name], DO  levonorgestrel-ethinyl estradiol (AVIANE,ALESSE,LESSINA) 0.1-20 MG-MCG tablet Take daily for 3 months 04/19/18   Huel Cote, NP  levothyroxine (SYNTHROID, LEVOTHROID) 137 MCG tablet Take 1 tablet (137 mcg total) by mouth daily before breakfast. 04/22/18   Carollee Herter, Alferd Apa, DO  norethindrone-ethinyl estradiol (JUNEL FE,GILDESS FE,LOESTRIN FE) 1-20 MG-MCG tablet Take 1 tablet by mouth daily. 01/26/18   Huel Cote, NP  ondansetron (ZOFRAN ODT) 8 MG disintegrating tablet Take 1 tablet (8 mg total) by mouth every 8 (eight) hours as needed for nausea or vomiting. 01/17/16   Carollee Herter, Alferd Apa, DO  QUEtiapine (SEROQUEL) 50 MG tablet Take 1 tablet (50 mg total) by mouth at bedtime.  06/03/18   [name], DO  rosuvastatin (CRESTOR) 10 MG tablet Take 1 tablet (10 mg total) by mouth daily. 12/30/17   [name], DO  valACYclovir (VALTREX) 1000 MG tablet Take 1 tablet (1,000 mg total) by mouth 3 (three) times daily. 01/26/18   Huel Cote, NP    Family History Family History  Problem Relation Age of Onset  . Depression Father        Suicide  . Aneurysm Mother        Brain  . Hypertension Sister   . Thyroid disease Sister   . Diabetes Maternal Grandmother   . Alzheimer's disease Maternal Grandmother   . Uterine cancer Maternal Grandmother   . Hypertension Maternal Grandmother   . Osteoporosis Maternal Grandmother   . Cancer Maternal Grandmother        uterine  . Lung cancer Maternal Grandfather   . Cancer Maternal Grandfather        lung  . Cancer Paternal Grandmother        metatstatic-- ? primary colon  . Cancer Paternal Grandfather        skin, hepatic    Social History Social History   Tobacco Use  . Smoking status: Former Smoker    Packs/day: 1.00    Last attempt to quit: 01/14/1995    Years since quitting: 23.5  . Smokeless tobacco: Former Systems developer    Quit date: 08/26/1995  . Tobacco comment: started smoking at age 51 1ppd  Substance Use Topics  . Alcohol use: Yes    Comment: Rare  . Drug use: No     Allergies   Morphine   Review of Systems Review of Systems  Gastrointestinal: Positive for nausea.  Skin: Positive for wound.  Neurological: Positive for syncope.  All other systems reviewed and are negative.    Physical Exam Updated Vital Signs BP (!) 130/92 (BP Location: Left Arm)   Pulse 90   Temp 98.4 F (36.9 C) (Oral)   Resp 18   Ht 5\' 4"  (1.626 m)   Wt 78 kg   LMP 07/14/2018   SpO2 100%   BMI 29.52 kg/m   Physical Exam  Constitutional: She is oriented to person, place, and time. She appears well-developed and well-nourished.  HENT:  Head: Normocephalic.    Right Ear: External ear normal.  Left  Ear: External ear normal.  Nose: Nose normal.  Mouth/Throat: Oropharynx is clear and moist.  Eyes: Pupils are equal, round, and reactive to light. Conjunctivae  and EOM are normal.  Neck: Normal range of motion. Neck supple.  Cardiovascular: Normal rate, regular rhythm, normal heart sounds and intact distal pulses.  Pulmonary/Chest: Effort normal and breath sounds normal.  Abdominal: Soft. Bowel sounds are normal.  Musculoskeletal: Normal range of motion.  Neurological: She is alert and oriented to person, place, and time.  Skin: Skin is warm and dry. Capillary refill takes less than 2 seconds.  Psychiatric: She has a normal mood and affect. Her behavior is normal. Judgment and thought content normal.  Nursing note and vitals reviewed.    ED Treatments / Results  Labs (all labs ordered are listed, but only abnormal results are displayed) Labs Reviewed  PREGNANCY, URINE  URINALYSIS, ROUTINE W REFLEX MICROSCOPIC  CBC WITH DIFFERENTIAL/PLATELET  COMPREHENSIVE METABOLIC PANEL  TROPONIN I  D-DIMER, QUANTITATIVE (NOT AT [facility])  CBG MONITORING, ED    EKG EKG Interpretation  Date/Time:  Thursday July 29 2018 12:01:47 EST Ventricular Rate:  83 PR Interval:  128 QRS Duration: 84 QT Interval:  386 QTC Calculation: 453 R Axis:   -5 Text Interpretation:  Normal sinus rhythm Normal ECG Confirmed by Isla Pence [phone]) on 07/29/2018 12:14:20 PM   Radiology No results found.  Procedures Procedures (including critical care time)  Medications Ordered in ED Medications  sodium chloride 0.9 % bolus 1,000 mL (1,000 mLs Intravenous Refused 07/29/18 1313)    And  0.9 %  sodium chloride infusion ( Intravenous Refused 07/29/18 1313)     Initial Impression / Assessment and Plan / ED Course  I have reviewed the triage vital signs and the nursing notes.  Pertinent labs & imaging results that were available during my care of the patient were reviewed by me and considered in my  medical decision making (see chart for details).    Pt refused all tests other than her initial EKG.  She took off her leads and told the nurse that she was leaving.  The pt was gone before I could go talk to her.  She did not receive any instructions for d/c.  She was awake and alert and able to make decisions.  Final Clinical Impressions(s) / ED Diagnoses   Final diagnoses:  Syncope, unspecified syncope type  Abrasion of face, initial encounter    ED Discharge Orders    None       Isla Pence, MD 07/29/18 1321

## 2018-07-29 NOTE — ED Notes (Addendum)
Pt is refusing CT scan, Chest xray. Pt refuses IV or IV fluids.  Pt will allow labs.  Dr. Gilford Raid notified

## 2018-07-29 NOTE — ED Notes (Signed)
Assumed care of pt; upon entering room to draw blood, pt found to be removing cardiac monitor leads and stickers; sts she is leaving bc she needs to get back to work; pt refused labs and all other orders.

## 2018-07-29 NOTE — Telephone Encounter (Signed)
Patient got up last night to use the bathroom- she got hot and dizzy and nauseous. She woke on the floor and she went back to bed.  Reason for Disposition . Any head or face injury  Answer Assessment - Initial Assessment Questions 1. ONSET: "How long were you unconscious?" (minutes) "When did it happen?"     Not sure-  Patient estimates- minutes 1-2 early this morning 2. CONTENT: "What happened during period of unconsciousness?" (e.g., seizure activity)      Patient was alone 3. MENTAL STATUS: "Alert and oriented now?" (oriented x 3 = name, month, location)      Patient is at work today 4. TRIGGER: "What do you think caused the fainting?" "What were you doing just before you fainted?"  (e.g., exercise, sudden standing up, prolonged standing)     Patient never had this happen before- patient thinks she may have had glucose drop, trying to make it back to the bed room 5. RECURRENT SYMPTOM: "Have you ever passed out before?" If so, ask: "When was the last time?" and "What happened that time?"      Never happened before 6. INJURY: "Did you sustain any injury during the fall?"      Scrape on face- above lip and on forehead 7. CARDIAC SYMPTOMS: "Have you had any of the following symptoms: chest pain, difficulty breathing, palpitations?"     Patient states she has palpitations daily 8. NEUROLOGIC SYMPTOMS: "Have you had any of the following symptoms: headache, numbness, vertigo, weakness?"     Fatigue- patient has cold 9. GI SYMPTOMS: "Have you had any of the following symptoms: abdominal pain, vomiting, diarrhea, blood in stools?"     no 10. OTHER SYMPTOMS: "Do you have any other symptoms?"       no 11. PREGNANCY: "Is there any chance you are pregnant?" "When was your last menstrual period?"       OCP- LMP 11/20  Protocols used: [facility]

## 2018-08-02 ENCOUNTER — Other Ambulatory Visit (INDEPENDENT_AMBULATORY_CARE_PROVIDER_SITE_OTHER): Payer: BLUE CROSS/BLUE SHIELD

## 2018-08-02 ENCOUNTER — Ambulatory Visit (INDEPENDENT_AMBULATORY_CARE_PROVIDER_SITE_OTHER): Payer: BLUE CROSS/BLUE SHIELD

## 2018-08-02 DIAGNOSIS — E785 Hyperlipidemia, unspecified: Secondary | ICD-10-CM | POA: Diagnosis not present

## 2018-08-02 DIAGNOSIS — Z23 Encounter for immunization: Secondary | ICD-10-CM

## 2018-08-02 DIAGNOSIS — E119 Type 2 diabetes mellitus without complications: Secondary | ICD-10-CM

## 2018-08-02 LAB — CBC WITH DIFFERENTIAL/PLATELET
Basophils Absolute: 0 10*3/uL (ref 0.0–0.1)
Basophils Relative: 0.6 % (ref 0.0–3.0)
Eosinophils Absolute: 0.2 10*3/uL (ref 0.0–0.7)
Eosinophils Relative: 1.9 % (ref 0.0–5.0)
HCT: 33.9 % — ABNORMAL LOW (ref 36.0–46.0)
Hemoglobin: 11 g/dL — ABNORMAL LOW (ref 12.0–15.0)
Lymphocytes Relative: 26.8 % (ref 12.0–46.0)
Lymphs Abs: 2.3 10*3/uL (ref 0.7–4.0)
MCHC: 32.4 g/dL (ref 30.0–36.0)
MCV: 73.7 fl — ABNORMAL LOW (ref 78.0–100.0)
Monocytes Absolute: 0.5 10*3/uL (ref 0.1–1.0)
Monocytes Relative: 6 % (ref 3.0–12.0)
Neutro Abs: 5.4 10*3/uL (ref 1.4–7.7)
Neutrophils Relative %: 64.7 % (ref 43.0–77.0)
Platelets: 441 10*3/uL — ABNORMAL HIGH (ref 150.0–400.0)
RBC: 4.6 Mil/uL (ref 3.87–5.11)
RDW: 14.6 % (ref 11.5–15.5)
WBC: 8.4 10*3/uL (ref 4.0–10.5)

## 2018-08-02 LAB — HEMOGLOBIN A1C: Hgb A1c MFr Bld: 6.1 % (ref 4.6–6.5)

## 2018-08-02 LAB — LIPID PANEL
Cholesterol: 137 mg/dL (ref 0–200)
HDL: 45.1 mg/dL (ref >39.00)
LDL Cholesterol: 62 mg/dL (ref 0–99)
NonHDL: 91.96
Total CHOL/HDL Ratio: 3
Triglycerides: 150 mg/dL — ABNORMAL HIGH (ref 0.0–149.0)
VLDL: 30 mg/dL (ref 0.0–40.0)

## 2018-09-21 ENCOUNTER — Ambulatory Visit: Payer: BLUE CROSS/BLUE SHIELD | Admitting: Family Medicine

## 2018-10-02 ENCOUNTER — Encounter: Payer: Self-pay | Admitting: Family Medicine

## 2018-10-02 DIAGNOSIS — E785 Hyperlipidemia, unspecified: Secondary | ICD-10-CM

## 2018-10-04 MED ORDER — FENOFIBRATE 160 MG PO TABS: 160.0000 mg | ORAL_TABLET | Freq: Every day | ORAL | 0 refills | Status: DC

## 2018-10-04 MED ORDER — ROSUVASTATIN CALCIUM 10 MG PO TABS: 10.0000 mg | ORAL_TABLET | Freq: Every day | ORAL | 0 refills | Status: DC

## 2018-10-12 ENCOUNTER — Ambulatory Visit: Payer: BLUE CROSS/BLUE SHIELD | Admitting: Family Medicine

## 2018-10-12 ENCOUNTER — Encounter: Payer: Self-pay | Admitting: Family Medicine

## 2018-10-12 VITALS — BP 124/62 | HR 88 | Resp 12 | Ht 64.0 in | Wt 176.0 lb

## 2018-10-12 DIAGNOSIS — E785 Hyperlipidemia, unspecified: Secondary | ICD-10-CM | POA: Diagnosis not present

## 2018-10-12 DIAGNOSIS — E063 Autoimmune thyroiditis: Secondary | ICD-10-CM | POA: Diagnosis not present

## 2018-10-12 DIAGNOSIS — E1165 Type 2 diabetes mellitus with hyperglycemia: Secondary | ICD-10-CM

## 2018-10-12 DIAGNOSIS — E039 Hypothyroidism, unspecified: Secondary | ICD-10-CM

## 2018-10-12 DIAGNOSIS — I10 Essential (primary) hypertension: Secondary | ICD-10-CM

## 2018-10-12 DIAGNOSIS — Z23 Encounter for immunization: Secondary | ICD-10-CM | POA: Diagnosis not present

## 2018-10-12 MED ORDER — FENOFIBRATE 160 MG PO TABS: 160.0000 mg | ORAL_TABLET | Freq: Every day | ORAL | 1 refills | Status: DC

## 2018-10-12 MED ORDER — ROSUVASTATIN CALCIUM 10 MG PO TABS: 10.0000 mg | ORAL_TABLET | Freq: Every day | ORAL | 1 refills | Status: DC

## 2018-10-12 NOTE — Progress Notes (Signed)
Patient ID: Katie [name], female    DOB: August 02, 1966  Age: 53 y.o. MRN: [id number]    Subjective:  Subjective  HPI Aarilyn Dye presents for f/u chol and bp.  She is also wanting her thyroid func checked due to fam hx hashimotos.  No other complaints    HPI HYPERTENSION   Blood pressure range-not checking  Chest pain- no      Dyspnea- no Lightheadedness- no   Edema- no  Other side effects - no   Medication compliance: good Low salt diet- yes    DIABETES    Blood Sugar ranges-not checking   Polyuria- no New Visual problems- no  Hypoglycemic symptoms- no  Other side effects-no Medication compliance - good Last eye exam- due   HYPERLIPIDEMIA  Medication compliance- good RUQ pain- no  Muscle aches- no Other side effects-no     Review of Systems  Constitutional: Negative for activity change, appetite change, diaphoresis, fatigue and unexpected weight change.  Eyes: Negative for pain, redness and visual disturbance.  Respiratory: Negative for cough, chest tightness, shortness of breath and wheezing.   Cardiovascular: Negative for chest pain, palpitations and leg swelling.  Endocrine: Negative for cold intolerance, heat intolerance, polydipsia, polyphagia and polyuria.  Genitourinary: Negative for difficulty urinating, dysuria and frequency.  Neurological: Negative for dizziness, light-headedness, numbness and headaches.  Psychiatric/Behavioral: Negative for behavioral problems and dysphoric mood. The patient is not nervous/anxious.     History Past Medical History:  Diagnosis Date  . Allergy   . Anemia   . Anxiety   . Blood transfusion without reported diagnosis   . Hyperlipidemia   . Migraine   . PVC (premature ventricular contraction)   . RLS (restless legs syndrome)   . Thyroid disease     She has a past surgical history that includes Tubal ligation (04/1999); Carpal tunnel release; Septoplasty (6/05); Cryoablation (11/04); Ectopic pregnancy surgery (1998);  Bunionectomy (06/04/2009); and Endometrial ablation.   Her family history includes Alzheimer's disease in her maternal grandmother; Aneurysm in her mother; Cancer in her maternal grandfather, maternal grandmother, paternal grandfather, and paternal grandmother; Depression in her father; Diabetes in her maternal grandmother; Hypertension in her maternal grandmother and sister; Lung cancer in her maternal grandfather; Osteoporosis in her maternal grandmother; Thyroid disease in her sister; Uterine cancer in her maternal grandmother.She reports that she quit smoking about 23 years ago. She smoked 1.00 pack per day. She quit smokeless tobacco use about 23 years ago. She reports current alcohol use. She reports that she does not use drugs.  Current Outpatient Medications on File Prior to Visit  Medication Sig Dispense Refill  . cetirizine (ZYRTEC) 10 MG tablet Take 1 tablet (10 mg total) by mouth daily.    Marland Kitchen desvenlafaxine (PRISTIQ) 100 MG 24 hr tablet Take 1 tablet (100 mg total) by mouth daily. 90 tablet 1  . levonorgestrel-ethinyl estradiol (AVIANE,ALESSE,LESSINA) 0.1-20 MG-MCG tablet Take daily for 3 months 4 Package 3  . levothyroxine (SYNTHROID, LEVOTHROID) 137 MCG tablet Take 1 tablet (137 mcg total) by mouth daily before breakfast. 90 tablet 1  . ondansetron (ZOFRAN ODT) 8 MG disintegrating tablet Take 1 tablet (8 mg total) by mouth every 8 (eight) hours as needed for nausea or vomiting. 20 tablet 2  . QUEtiapine (SEROQUEL) 50 MG tablet Take 1 tablet (50 mg total) by mouth at bedtime. 90 tablet 1  . valACYclovir (VALTREX) 1000 MG tablet Take 1 tablet (1,000 mg total) by mouth 3 (three) times daily. 30 tablet 2   No  current facility-administered medications on file prior to visit.      Objective:  Objective  Physical Exam Vitals signs and nursing note reviewed.  Constitutional:      Appearance: She is well-developed.  HENT:     Head: Normocephalic and atraumatic.  Eyes:      Conjunctiva/sclera: Conjunctivae normal.  Neck:     Musculoskeletal: Normal range of motion and neck supple.     Thyroid: No thyroid mass, thyromegaly or thyroid tenderness.     Vascular: No carotid bruit or JVD.  Cardiovascular:     Rate and Rhythm: Normal rate and regular rhythm.     Heart sounds: Normal heart sounds. No murmur.  Pulmonary:     Effort: Pulmonary effort is normal. No respiratory distress.     Breath sounds: Normal breath sounds. No wheezing or rales.  Chest:     Chest wall: No tenderness.  Neurological:     Mental Status: She is alert and oriented to person, place, and time.    BP 124/62 (BP Location: Left Arm, Patient Position: Sitting, Cuff Size: Normal)   Pulse 88   Resp 12   Ht 5\' 4"  (1.626 m)   Wt 176 lb (79.8 kg)   SpO2 99%   BMI 30.21 kg/m  Wt Readings from Last 3 Encounters:  10/12/18 176 lb (79.8 kg)  07/29/18 172 lb (78 kg)  03/18/18 181 lb 3.2 oz (82.2 kg)     Lab Results  Component Value Date   WBC 8.4 08/02/2018   HGB 11.0 (L) 08/02/2018   HCT 33.9 (L) 08/02/2018   PLT 441.0 (H) 08/02/2018   GLUCOSE 79 10/12/2018   CHOL 142 10/12/2018   TRIG 114.0 10/12/2018   HDL 49.30 10/12/2018   LDLDIRECT 138.3 06/20/2008   LDLCALC 70 10/12/2018   ALT 13 10/12/2018   AST 17 10/12/2018   NA 138 10/12/2018   K 5.5 (H) 10/12/2018   CL 105 10/12/2018   CREATININE 0.80 10/12/2018   BUN 18 10/12/2018   CO2 25 10/12/2018   TSH 1.22 03/18/2018   HGBA1C 6.1 08/02/2018   MICROALBUR 1.0 03/18/2018    No results found.   Assessment & Plan:  Plan  I have discontinued Kailana Antwi's norethindrone-ethinyl estradiol. I am also having her maintain her ondansetron, cetirizine, valACYclovir, levonorgestrel-ethinyl estradiol, levothyroxine, desvenlafaxine, QUEtiapine, rosuvastatin, and fenofibrate.  Meds ordered this encounter  Medications  . rosuvastatin (CRESTOR) 10 MG tablet    Sig: Take 1 tablet (10 mg total) by mouth daily.    Dispense:  90 tablet     Refill:  1    Requested drug refills are authorized, however, the patient needs further evaluation and/or laboratory testing before further refills are given. Ask her to make an appointment for this.  . fenofibrate 160 MG tablet    Sig: Take 1 tablet (160 mg total) by mouth daily.    Dispense:  90 tablet    Refill:  1    Requested drug refills are authorized, however, the patient needs further evaluation and/or laboratory testing before further refills are given. Ask her to make an appointment for this.    Problem List Items Addressed This Visit      Unprioritized   Hyperlipidemia   Relevant Medications   rosuvastatin (CRESTOR) 10 MG tablet   fenofibrate 160 MG tablet   Hyperlipidemia LDL goal <100    Tolerating statin, encouraged heart healthy diet, avoid trans fats, minimize simple carbs and saturated fats. Increase exercise as tolerated  Relevant Medications   rosuvastatin (CRESTOR) 10 MG tablet   fenofibrate 160 MG tablet   Other Relevant Orders   Lipid panel (Completed)   Comprehensive metabolic panel (Completed)   Hypothyroidism    Check labs  con't meds      Relevant Orders   Thyroid Panel With TSH   Thyroid peroxidase antibody   Need for shingles vaccine   Relevant Orders   Varicella-zoster vaccine IM (Shingrix) (Completed)   Type 2 diabetes mellitus with hyperglycemia, without long-term current use of insulin (HCC)    hgba1c to be checked , minimize simple carbs. Increase exercise as tolerated. Continue current meds       Relevant Medications   rosuvastatin (CRESTOR) 10 MG tablet    Other Visit Diagnoses    Essential hypertension    -  Primary   Relevant Medications   rosuvastatin (CRESTOR) 10 MG tablet   fenofibrate 160 MG tablet   Other Relevant Orders   Lipid panel (Completed)   Comprehensive metabolic panel (Completed)      Follow-up: Return in about 6 months (around 04/12/2019), or if symptoms worsen or fail to improve, for annual exam,  fasting.  [name], DO

## 2018-10-12 NOTE — Patient Instructions (Signed)
Carbohydrate Counting for Diabetes Mellitus, Adult  Carbohydrate counting is a method of keeping track of how many carbohydrates you eat. Eating carbohydrates naturally increases the amount of sugar (glucose) in the blood. Counting how many carbohydrates you eat helps keep your blood glucose within normal limits, which helps you manage your diabetes (diabetes mellitus). It is important to know how many carbohydrates you can safely have in each meal. This is different for every person. A diet and nutrition specialist (registered dietitian) can help you make a meal plan and calculate how many carbohydrates you should have at each meal and snack. Carbohydrates are found in the following foods:  Grains, such as breads and cereals.  Dried beans and soy products.  Starchy vegetables, such as potatoes, peas, and corn.  Fruit and fruit juices.  Milk and yogurt.  Sweets and snack foods, such as cake, cookies, candy, chips, and soft drinks. How do I count carbohydrates? There are two ways to count carbohydrates in food. You can use either of the methods or a combination of both. Reading "Nutrition Facts" on packaged food The "Nutrition Facts" list is included on the labels of almost all packaged foods and beverages in the [place] It includes:  The serving size.  Information about nutrients in each serving, including the grams (g) of carbohydrate per serving. To use the "Nutrition Facts":  Decide how many servings you will have.  Multiply the number of servings by the number of carbohydrates per serving.  The resulting number is the total amount of carbohydrates that you will be having. Learning standard serving sizes of other foods When you eat carbohydrate foods that are not packaged or do not include "Nutrition Facts" on the label, you need to measure the servings in order to count the amount of carbohydrates:  Measure the foods that you will eat with a food scale or measuring cup, if needed.   Decide how many standard-size servings you will eat.  Multiply the number of servings by 15. Most carbohydrate-rich foods have about 15 g of carbohydrates per serving. ? For example, if you eat 8 oz (170 g) of strawberries, you will have eaten 2 servings and 30 g of carbohydrates (2 servings x 15 g = 30 g).  For foods that have more than one food mixed, such as soups and casseroles, you must count the carbohydrates in each food that is included. The following list contains standard serving sizes of common carbohydrate-rich foods. Each of these servings has about 15 g of carbohydrates:   hamburger bun or  English muffin.   oz (15 mL) syrup.   oz (14 g) jelly.  1 slice of bread.  1 six-inch tortilla.  3 oz (85 g) cooked rice or pasta.  4 oz (113 g) cooked dried beans.  4 oz (113 g) starchy vegetable, such as peas, corn, or potatoes.  4 oz (113 g) hot cereal.  4 oz (113 g) mashed potatoes or  of a large baked potato.  4 oz (113 g) canned or frozen fruit.  4 oz (120 mL) fruit juice.  4-6 crackers.  6 chicken nuggets.  6 oz (170 g) unsweetened dry cereal.  6 oz (170 g) plain fat-free yogurt or yogurt sweetened with artificial sweeteners.  8 oz (240 mL) milk.  8 oz (170 g) fresh fruit or one small piece of fruit.  24 oz (680 g) popped popcorn. Example of carbohydrate counting Sample meal  3 oz (85 g) chicken breast.  6 oz (170 g)   brown rice.  4 oz (113 g) corn.  8 oz (240 mL) milk.  8 oz (170 g) strawberries with sugar-free whipped topping. Carbohydrate calculation 1. Identify the foods that contain carbohydrates: ? Rice. ? Corn. ? Milk. ? Strawberries. 2. Calculate how many servings you have of each food: ? 2 servings rice. ? 1 serving corn. ? 1 serving milk. ? 1 serving strawberries. 3. Multiply each number of servings by 15 g: ? 2 servings rice x 15 g = 30 g. ? 1 serving corn x 15 g = 15 g. ? 1 serving milk x 15 g = 15 g. ? 1 serving  strawberries x 15 g = 15 g. 4. Add together all of the amounts to find the total grams of carbohydrates eaten: ? 30 g + 15 g + 15 g + 15 g = 75 g of carbohydrates total. Summary  Carbohydrate counting is a method of keeping track of how many carbohydrates you eat.  Eating carbohydrates naturally increases the amount of sugar (glucose) in the blood.  Counting how many carbohydrates you eat helps keep your blood glucose within normal limits, which helps you manage your diabetes.  A diet and nutrition specialist (registered dietitian) can help you make a meal plan and calculate how many carbohydrates you should have at each meal and snack. This information is not intended to replace advice given to you by your health care provider. Make sure you discuss any questions you have with your health care provider. Document Released: 08/11/2005 Document Revised: 02/18/2017 Document Reviewed: 01/23/2016 Elsevier Interactive Patient Education  2019 Elsevier Inc.  

## 2018-10-13 DIAGNOSIS — Z23 Encounter for immunization: Secondary | ICD-10-CM | POA: Insufficient documentation

## 2018-10-13 LAB — LIPID PANEL
Cholesterol: 142 mg/dL (ref 0–200)
HDL: 49.3 mg/dL (ref >39.00)
LDL Cholesterol: 70 mg/dL (ref 0–99)
NonHDL: 92.85
Total CHOL/HDL Ratio: 3
Triglycerides: 114 mg/dL (ref 0.0–149.0)
VLDL: 22.8 mg/dL (ref 0.0–40.0)

## 2018-10-13 LAB — COMPREHENSIVE METABOLIC PANEL
ALT: 13 U/L (ref 0–35)
AST: 17 U/L (ref 0–37)
Albumin: 4 g/dL (ref 3.5–5.2)
Alkaline Phosphatase: 39 U/L (ref 39–117)
BUN: 18 mg/dL (ref 6–23)
CO2: 25 mEq/L (ref 19–32)
Calcium: 9 mg/dL (ref 8.4–10.5)
Chloride: 105 mEq/L (ref 96–112)
Creatinine, Ser: 0.8 mg/dL (ref 0.40–1.20)
GFR: 75.78 mL/min (ref >60.00)
Glucose, Bld: 79 mg/dL (ref 70–99)
Potassium: 5.5 mEq/L — ABNORMAL HIGH (ref 3.5–5.1)
Sodium: 138 mEq/L (ref 135–145)
Total Bilirubin: 0.3 mg/dL (ref 0.2–1.2)
Total Protein: 7 g/dL (ref 6.0–8.3)

## 2018-10-13 NOTE — Assessment & Plan Note (Signed)
Tolerating statin, encouraged heart healthy diet, avoid trans fats, minimize simple carbs and saturated fats. Increase exercise as tolerated 

## 2018-10-13 NOTE — Assessment & Plan Note (Signed)
hgba1c to be checked, minimize simple carbs. Increase exercise as tolerated. Continue current meds  

## 2018-10-13 NOTE — Assessment & Plan Note (Signed)
Check labs con't meds 

## 2018-10-14 ENCOUNTER — Other Ambulatory Visit: Payer: Self-pay | Admitting: Family Medicine

## 2018-10-14 DIAGNOSIS — R7989 Other specified abnormal findings of blood chemistry: Secondary | ICD-10-CM

## 2018-10-14 DIAGNOSIS — E039 Hypothyroidism, unspecified: Secondary | ICD-10-CM

## 2018-10-14 LAB — THYROID PANEL WITH TSH
Free Thyroxine Index: 3.2 (ref 1.4–3.8)
T3 Uptake: 24 % (ref 22–35)
T4, Total: 13.4 ug/dL — ABNORMAL HIGH (ref 5.1–11.9)
TSH: 0.34 mIU/L — ABNORMAL LOW

## 2018-10-14 LAB — THYROID PEROXIDASE ANTIBODY: Thyroperoxidase Ab SerPl-aCnc: 42 IU/mL — ABNORMAL HIGH (ref <9)

## 2018-10-14 MED ORDER — LEVOTHYROXINE SODIUM 125 MCG PO TABS: 125.0000 ug | ORAL_TABLET | Freq: Every day | ORAL | 3 refills | Status: DC

## 2018-10-15 ENCOUNTER — Encounter: Payer: Self-pay | Admitting: Family Medicine

## 2018-10-15 ENCOUNTER — Encounter: Payer: Self-pay | Admitting: *Deleted

## 2018-10-26 ENCOUNTER — Other Ambulatory Visit: Payer: Self-pay

## 2018-10-26 ENCOUNTER — Ambulatory Visit (INDEPENDENT_AMBULATORY_CARE_PROVIDER_SITE_OTHER): Payer: BLUE CROSS/BLUE SHIELD | Admitting: Internal Medicine

## 2018-10-26 ENCOUNTER — Encounter: Payer: Self-pay | Admitting: Internal Medicine

## 2018-10-26 VITALS — BP 122/84 | HR 75 | Ht 64.0 in | Wt 176.4 lb

## 2018-10-26 DIAGNOSIS — E063 Autoimmune thyroiditis: Secondary | ICD-10-CM

## 2018-10-26 DIAGNOSIS — E039 Hypothyroidism, unspecified: Secondary | ICD-10-CM | POA: Diagnosis not present

## 2018-10-26 NOTE — Progress Notes (Signed)
Name: Katie [name]  MRN/ DOB: [id number], 12-05-1965    Age/ Sex: 53 y.o., female    PCP: Carollee Herter, Alferd Apa, DO   Reason for Endocrinology Evaluation: Hypothyroidism      Date of Initial Endocrinology Evaluation: 10/26/2018     HPI: Katie [name] is a 53 y.o. female with a past medical history of Hypothyroidism and GAD. The patient presented for initial endocrinology clinic visit on 10/26/2018 for consultative assistance with her Hypothyroidism .   She was diagnosed with hypothyroidism in 1998 and has been on LT-4 replacement.  Patient is to see Dr. Debbora Presto (endocrinologist) years ago.  She recently requested her PCP to check anti-TPO antibodies which came back elevated.  She denies previous exposure to radiation  She denies any local neck symptoms She denies the use of biotin She does have chronic fatigue that she attributes to insomnia.  Otherwise she denies any symptoms of hypothyroidism Sister and a niece with Hashimoto's    HISTORY:  Past Medical History:  Past Medical History:  Diagnosis Date  . Allergy   . Anemia   . Anxiety   . Blood transfusion without reported diagnosis   . Hyperlipidemia   . Migraine   . PVC (premature ventricular contraction)   . RLS (restless legs syndrome)   . Thyroid disease    Past Surgical History:  Past Surgical History:  Procedure Laterality Date  . BUNIONECTOMY  06/04/2009   Right foot  . CARPAL TUNNEL RELEASE     Bilateral  . CRYOABLATION  11/04  . ECTOPIC PREGNANCY SURGERY  1998  . ENDOMETRIAL ABLATION    . SEPTOPLASTY  6/05  . TUBAL LIGATION  04/1999      Social History:  reports that she quit smoking about 23 years ago. She smoked 1.00 pack per day. She quit smokeless tobacco use about 23 years ago. She reports current alcohol use. She reports that she does not use drugs.  Family History: family history includes Alzheimer's disease in her maternal grandmother; Aneurysm in her mother; Cancer in her maternal  grandfather, maternal grandmother, paternal grandfather, and paternal grandmother; Depression in her father; Diabetes in her maternal grandmother; Hypertension in her maternal grandmother and sister; Lung cancer in her maternal grandfather; Osteoporosis in her maternal grandmother; Thyroid disease in her sister; Uterine cancer in her maternal grandmother.   HOME MEDICATIONS: Allergies as of 10/26/2018      Reactions   Morphine    Nausea & vomiting      Medication List       Accurate as of October 26, 2018  4:06 PM. Always use your most recent med list.        cetirizine 10 MG tablet Commonly known as:  ZYRTEC Take 1 tablet (10 mg total) by mouth daily.   desvenlafaxine 100 MG 24 hr tablet Commonly known as:  PRISTIQ Take 1 tablet (100 mg total) by mouth daily.   fenofibrate 160 MG tablet Take 1 tablet (160 mg total) by mouth daily.   levonorgestrel-ethinyl estradiol 0.1-20 MG-MCG tablet Commonly known as:  AVIANE,ALESSE,LESSINA Take daily for 3 months   levothyroxine 125 MCG tablet Commonly known as:  SYNTHROID, LEVOTHROID Take 1 tablet (125 mcg total) by mouth daily.   ondansetron 8 MG disintegrating tablet Commonly known as:  ZOFRAN ODT Take 1 tablet (8 mg total) by mouth every 8 (eight) hours as needed for nausea or vomiting.   QUEtiapine 50 MG tablet Commonly known as:  SEROQUEL Take 1 tablet (50  mg total) by mouth at bedtime.   rosuvastatin 10 MG tablet Commonly known as:  CRESTOR Take 1 tablet (10 mg total) by mouth daily.   valACYclovir 1000 MG tablet Commonly known as:  VALTREX Take 1 tablet (1,000 mg total) by mouth 3 (three) times daily.         REVIEW OF SYSTEMS: A comprehensive ROS was conducted with the patient and is negative except as per HPI and below:  Review of Systems  Constitutional: Negative for fever.  HENT: Negative for congestion and sore throat.   Respiratory: Negative for cough and shortness of breath.   Cardiovascular: Positive for  palpitations. Negative for chest pain.  Gastrointestinal: Negative for constipation and diarrhea.  Genitourinary: Negative for frequency.  Musculoskeletal: Positive for joint pain.  Neurological: Negative for tingling and tremors.  Endo/Heme/Allergies: Positive for polydipsia.  Psychiatric/Behavioral: The patient has insomnia.        OBJECTIVE:  VS: BP 122/84 (BP Location: Left Arm, Patient Position: Sitting, Cuff Size: Normal)   Pulse 75   Ht 5\' 4"  (1.626 m)   Wt 176 lb 6.4 oz (80 kg)   SpO2 97%   BMI 30.28 kg/m    Wt Readings from Last 3 Encounters:  10/26/18 176 lb 6.4 oz (80 kg)  10/12/18 176 lb (79.8 kg)  07/29/18 172 lb (78 kg)     EXAM: General: Pt appears well and is in NAD  Hydration: Well-hydrated with moist mucous membranes and good skin turgor  Eyes: External eye exam normal without stare, lid lag or exophthalmos.  EOM intact.  PERRL.  Ears, Nose, Throat: Hearing: Grossly intact bilaterally Dental: Good dentition  Throat: Clear without mass, erythema or exudate  Neck: General: Supple without adenopathy. Thyroid: Thyroid size normal.  No goiter or nodules appreciated. No thyroid bruit.  Lungs: Clear with good BS bilat with no rales, rhonchi, or wheezes  Heart: Auscultation: RRR.  Abdomen: Normoactive bowel sounds, soft, nontender, without masses or organomegaly palpable  Extremities:  BL LE: No pretibial edema normal ROM and strength.  Skin: Hair: Texture and amount normal with gender appropriate distribution Skin Inspection: No rashes Skin Palpation: Skin temperature, texture, and thickness normal to palpation  Neuro: Cranial nerves: II - XII grossly intact  Motor: Normal strength throughout DTRs: 2+ and symmetric in UE without delay in relaxation phase  Mental Status: Judgment, insight: Intact Orientation: Oriented to time, place, and person Mood and affect: No depression, anxiety, or agitation     DATA REVIEWED: Results for BIANCO, CANGE (MRN  [id number]) as of 10/26/2018 16:06  Ref. Range 10/12/2018 17:21  TSH Latest Units: mIU/L 0.34 (L)  Thyroxine (T4) Latest Ref Range: 5.1 - 11.9 mcg/dL 13.4 (H)  Free Thyroxine Index Latest Ref Range: 1.4 - 3.8  3.2  Thyroperoxidase Ab SerPl-aCnc Latest Ref Range: <9 IU/mL 42 (H)  T3 Uptake Latest Ref Range: 22 - 35 % 24     Thyroid Ultrasound 12/20/2004  Findings: The thyroid gland is slightly enlarged at the right lobe and isthmus with the left lobe upper limits of normal in size with diffuse inhomogeneous thyroid echotexture. No focal lesions are seen. The right lobe measures 5.6 cm long x 2.5 cm AP x 2.2 cm wide. The left lobe measures 4.7 cm long x 1.7 cm AP x 2.1 cm wide with the isthmus measuring 6 mm AP.   IMPRESSION:  Probable asymmetric slight multinodular goiter.  ASSESSMENT/PLAN/RECOMMENDATIONS:    1. 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 The usual course of Hashimoto's thyroiditis is the gradual loss of thyroid function.  -Patient is already on LT-4 replacement since 1998. -She is clinically euthyroid, she denies any local neck symptoms -I agree with her PCP in reducing the dose of levothyroxine from 137 mcg to 125 mcg daily based on the latest TFT results, would recommend rechecking in 6 to 8 weeks from the time of the change. -TSH goal would be 0.5-2.5 uIU/mL -We discussed the risks of overtreatment with levothyroxine such as osteoporosis and cardiac arrhythmia. - Pt educated extensively on the correct way to take levothyroxine (first thing in the morning with water, 30 minutes before eating or taking other medications). - Pt encouraged to double dose the following day if she were to miss a dose given long half-life of levothyroxine.   Medications : Continue levothyroxine 125 MCG daily Recheck TFTs in 6 to 8 weeks at PCPs office  Follow-up  PRN  Signed electronically by: Mack Guise, MD  [facility]  [facility] [address] [place], [place] [address] Phone: [phone] FAX: [phone]   CC: [name], DO [address] [place] [address] Phone: [phone] Fax: [phone]   Return to Endocrinology clinic as below: No future appointments.

## 2018-10-26 NOTE — Patient Instructions (Signed)

## 2018-11-02 ENCOUNTER — Encounter: Payer: Self-pay | Admitting: Family Medicine

## 2018-11-02 DIAGNOSIS — E785 Hyperlipidemia, unspecified: Secondary | ICD-10-CM

## 2018-11-02 MED ORDER — FENOFIBRATE 160 MG PO TABS: 160.0000 mg | ORAL_TABLET | Freq: Every day | ORAL | 1 refills | Status: DC

## 2018-11-24 ENCOUNTER — Encounter: Payer: Self-pay | Admitting: Family Medicine

## 2018-11-24 DIAGNOSIS — F329 Major depressive disorder, single episode, unspecified: Secondary | ICD-10-CM

## 2018-11-24 DIAGNOSIS — F32A Depression, unspecified: Secondary | ICD-10-CM

## 2018-11-24 MED ORDER — DESVENLAFAXINE SUCCINATE ER 100 MG PO TB24: 100.0000 mg | ORAL_TABLET | Freq: Every day | ORAL | 1 refills | Status: DC

## 2018-12-06 ENCOUNTER — Telehealth: Payer: Self-pay

## 2018-12-06 NOTE — Telephone Encounter (Signed)
Copied from [place] [id number]. Topic: Appointment Scheduling - Scheduling Inquiry for Clinic >> Dec 03, 2018 11:27 AM Rayann Heman wrote: Reason for CRM: pt called and state that she would like to schedule an appointment for labs. Please advise

## 2018-12-06 NOTE — Telephone Encounter (Signed)
Called patient verified lab orders, Appointmenrt scheduled for TSH and T4 free.

## 2018-12-07 ENCOUNTER — Other Ambulatory Visit: Payer: Self-pay

## 2018-12-07 ENCOUNTER — Other Ambulatory Visit (INDEPENDENT_AMBULATORY_CARE_PROVIDER_SITE_OTHER): Payer: BLUE CROSS/BLUE SHIELD

## 2018-12-07 DIAGNOSIS — E039 Hypothyroidism, unspecified: Secondary | ICD-10-CM | POA: Diagnosis not present

## 2018-12-07 LAB — TSH: TSH: 0.93 u[IU]/mL (ref 0.35–4.50)

## 2018-12-07 LAB — T4, FREE: Free T4: 0.9 ng/dL (ref 0.60–1.60)

## 2018-12-08 ENCOUNTER — Encounter: Payer: Self-pay | Admitting: Family Medicine

## 2018-12-08 ENCOUNTER — Other Ambulatory Visit: Payer: Self-pay | Admitting: Family Medicine

## 2018-12-08 DIAGNOSIS — G4700 Insomnia, unspecified: Secondary | ICD-10-CM

## 2018-12-08 DIAGNOSIS — E039 Hypothyroidism, unspecified: Secondary | ICD-10-CM

## 2018-12-08 MED ORDER — QUETIAPINE FUMARATE 50 MG PO TABS: 50.0000 mg | ORAL_TABLET | Freq: Every day | ORAL | 1 refills | Status: DC

## 2019-01-03 ENCOUNTER — Encounter: Payer: Self-pay | Admitting: Family Medicine

## 2019-01-03 DIAGNOSIS — E785 Hyperlipidemia, unspecified: Secondary | ICD-10-CM

## 2019-01-03 MED ORDER — ROSUVASTATIN CALCIUM 10 MG PO TABS: 10.0000 mg | ORAL_TABLET | Freq: Every day | ORAL | 1 refills | Status: DC

## 2019-01-24 ENCOUNTER — Other Ambulatory Visit: Payer: Self-pay

## 2019-01-26 ENCOUNTER — Encounter: Payer: Self-pay | Admitting: Women's Health

## 2019-01-31 ENCOUNTER — Ambulatory Visit (INDEPENDENT_AMBULATORY_CARE_PROVIDER_SITE_OTHER): Payer: BC Managed Care – PPO | Admitting: Women's Health

## 2019-01-31 ENCOUNTER — Other Ambulatory Visit (INDEPENDENT_AMBULATORY_CARE_PROVIDER_SITE_OTHER): Payer: BLUE CROSS/BLUE SHIELD

## 2019-01-31 ENCOUNTER — Encounter: Payer: Self-pay | Admitting: Women's Health

## 2019-01-31 ENCOUNTER — Other Ambulatory Visit: Payer: Self-pay

## 2019-01-31 VITALS — BP 160/90 | Ht 64.0 in | Wt 181.0 lb

## 2019-01-31 DIAGNOSIS — Z01419 Encounter for gynecological examination (general) (routine) without abnormal findings: Secondary | ICD-10-CM | POA: Diagnosis not present

## 2019-01-31 DIAGNOSIS — E039 Hypothyroidism, unspecified: Secondary | ICD-10-CM

## 2019-01-31 MED ORDER — LEVONORGESTREL-ETHINYL ESTRAD 0.1-20 MG-MCG PO TABS: ORAL_TABLET | ORAL | 4 refills | Status: DC

## 2019-01-31 NOTE — Patient Instructions (Addendum)
Mammogram  [facility]  [phone] Colonoscopy  [facility]  Dr Carlean Purl   [phone]  Health Maintenance, Female Adopting a healthy lifestyle and getting preventive care can go a long way to promote health and wellness. Talk with your health care provider about what schedule of regular examinations is right for you. This is a good chance for you to check in with your provider about disease prevention and staying healthy. In between checkups, there are plenty of things you can do on your own. Experts have done a lot of research about which lifestyle changes and preventive measures are most likely to keep you healthy. Ask your health care provider for more information. Weight and diet Eat a healthy diet  Be sure to include plenty of vegetables, fruits, low-fat dairy products, and lean protein.  Do not eat a lot of foods high in solid fats, added sugars, or salt.  Get regular exercise. This is one of the most important things you can do for your health. ? Most adults should exercise for at least 150 minutes each week. The exercise should increase your heart rate and make you sweat (moderate-intensity exercise). ? Most adults should also do strengthening exercises at least twice a week. This is in addition to the moderate-intensity exercise. Maintain a healthy weight  Body mass index (BMI) is a measurement that can be used to identify possible weight problems. It estimates body fat based on height and weight. Your health care provider can help determine your BMI and help you achieve or maintain a healthy weight.  For females 69 years of age and older: ? A BMI below 18.5 is considered underweight. ? A BMI of 18.5 to 24.9 is normal. ? A BMI of 25 to 29.9 is considered overweight. ? A BMI of 30 and above is considered obese. Watch levels of cholesterol and blood lipids  You should start having your blood tested for lipids and cholesterol at 51 years of age, then have this test every 5 years.  You may  need to have your cholesterol levels checked more often if: ? Your lipid or cholesterol levels are high. ? You are older than 51 years of age. ? You are at high risk for heart disease. Cancer screening Lung Cancer  Lung cancer screening is recommended for adults 87-58 years old who are at high risk for lung cancer because of a history of smoking.  A yearly low-dose CT scan of the lungs is recommended for people who: ? Currently smoke. ? Have quit within the past 15 years. ? Have at least a 30-pack-year history of smoking. A pack year is smoking an average of one pack of cigarettes a day for 1 year.  Yearly screening should continue until it has been 15 years since you quit.  Yearly screening should stop if you develop a health problem that would prevent you from having lung cancer treatment. Breast Cancer  Practice breast self-awareness. This means understanding how your breasts normally appear and feel.  It also means doing regular breast self-exams. Let your health care provider know about any changes, no matter how small.  If you are in your 20s or 30s, you should have a clinical breast exam (CBE) by a health care provider every 1-3 years as part of a regular health exam.  If you are 27 or older, have a CBE every year. Also consider having a breast X-ray (mammogram) every year.  If you have a family history of breast cancer, talk to your health  care provider about genetic screening.  If you are at high risk for breast cancer, talk to your health care provider about having an MRI and a mammogram every year.  Breast cancer gene (BRCA) assessment is recommended for women who have family members with BRCA-related cancers. BRCA-related cancers include: ? Breast. ? Ovarian. ? Tubal. ? Peritoneal cancers.  Results of the assessment will determine the need for genetic counseling and BRCA1 and BRCA2 testing. Cervical Cancer Your health care provider may recommend that you be screened  regularly for cancer of the pelvic organs (ovaries, uterus, and vagina). This screening involves a pelvic examination, including checking for microscopic changes to the surface of your cervix (Pap test). You may be encouraged to have this screening done every 3 years, beginning at age 84.  For women ages 17-65, health care providers may recommend pelvic exams and Pap testing every 3 years, or they may recommend the Pap and pelvic exam, combined with testing for human papilloma virus (HPV), every 5 years. Some types of HPV increase your risk of cervical cancer. Testing for HPV may also be done on women of any age with unclear Pap test results.  Other health care providers may not recommend any screening for nonpregnant women who are considered low risk for pelvic cancer and who do not have symptoms. Ask your health care provider if a screening pelvic exam is right for you.  If you have had past treatment for cervical cancer or a condition that could lead to cancer, you need Pap tests and screening for cancer for at least 20 years after your treatment. If Pap tests have been discontinued, your risk factors (such as having a new sexual partner) need to be reassessed to determine if screening should resume. Some women have medical problems that increase the chance of getting cervical cancer. In these cases, your health care provider may recommend more frequent screening and Pap tests. Colorectal Cancer  This type of cancer can be detected and often prevented.  Routine colorectal cancer screening usually begins at 51 years of age and continues through 51 years of age.  Your health care provider may recommend screening at an earlier age if you have risk factors for colon cancer.  Your health care provider may also recommend using home test kits to check for hidden blood in the stool.  A small camera at the end of a tube can be used to examine your colon directly (sigmoidoscopy or colonoscopy). This is  done to check for the earliest forms of colorectal cancer.  Routine screening usually begins at age 37.  Direct examination of the colon should be repeated every 5-10 years through 51 years of age. However, you may need to be screened more often if early forms of precancerous polyps or small growths are found. Skin Cancer  Check your skin from head to toe regularly.  Tell your health care provider about any new moles or changes in moles, especially if there is a change in a mole's shape or color.  Also tell your health care provider if you have a mole that is larger than the size of a pencil eraser.  Always use sunscreen. Apply sunscreen liberally and repeatedly throughout the day.  Protect yourself by wearing long sleeves, pants, a wide-brimmed hat, and sunglasses whenever you are outside. Heart disease, diabetes, and high blood pressure  High blood pressure causes heart disease and increases the risk of stroke. High blood pressure is more likely to develop in: ? People who  have blood pressure in the high end of the normal range (130-139/85-89 mm Hg). ? People who are overweight or obese. ? People who are African American.  If you are 62-21 years of age, have your blood pressure checked every 3-5 years. If you are 47 years of age or older, have your blood pressure checked every year. You should have your blood pressure measured twice-once when you are at a hospital or clinic, and once when you are not at a hospital or clinic. Record the average of the two measurements. To check your blood pressure when you are not at a hospital or clinic, you can use: ? An automated blood pressure machine at a pharmacy. ? A home blood pressure monitor.  If you are between 1 years and 53 years old, ask your health care provider if you should take aspirin to prevent strokes.  Have regular diabetes screenings. This involves taking a blood sample to check your fasting blood sugar level. ? If you are at a  normal weight and have a low risk for diabetes, have this test once every three years after 51 years of age. ? If you are overweight and have a high risk for diabetes, consider being tested at a younger age or more often. Preventing infection Hepatitis B  If you have a higher risk for hepatitis B, you should be screened for this virus. You are considered at high risk for hepatitis B if: ? You were born in a country where hepatitis B is common. Ask your health care provider which countries are considered high risk. ? Your parents were born in a high-risk country, and you have not been immunized against hepatitis B (hepatitis B vaccine). ? You have HIV or AIDS. ? You use needles to inject street drugs. ? You live with someone who has hepatitis B. ? You have had sex with someone who has hepatitis B. ? You get hemodialysis treatment. ? You take certain medicines for conditions, including cancer, organ transplantation, and autoimmune conditions. Hepatitis C  Blood testing is recommended for: ? Everyone born from [birth year] through 1963. ? Anyone with known risk factors for hepatitis C. Sexually transmitted infections (STIs)  You should be screened for sexually transmitted infections (STIs) including gonorrhea and chlamydia if: ? You are sexually active and are younger than 51 years of age. ? You are older than 51 years of age and your health care provider tells you that you are at risk for this type of infection. ? Your sexual activity has changed since you were last screened and you are at an increased risk for chlamydia or gonorrhea. Ask your health care provider if you are at risk.  If you do not have HIV, but are at risk, it may be recommended that you take a prescription medicine daily to prevent HIV infection. This is called pre-exposure prophylaxis (PrEP). You are considered at risk if: ? You are sexually active and do not regularly use condoms or know the HIV status of your partner(s). ? You  take drugs by injection. ? You are sexually active with a partner who has HIV. Talk with your health care provider about whether you are at high risk of being infected with HIV. If you choose to begin PrEP, you should first be tested for HIV. You should then be tested every 3 months for as long as you are taking PrEP. Pregnancy  If you are premenopausal and you may become pregnant, ask your health care provider about preconception counseling.  If you may become pregnant, take 400 to 800 micrograms (mcg) of folic acid every day.  If you want to prevent pregnancy, talk to your health care provider about birth control (contraception). Osteoporosis and menopause  Osteoporosis is a disease in which the bones lose minerals and strength with aging. This can result in serious bone fractures. Your risk for osteoporosis can be identified using a bone density scan.  If you are 32 years of age or older, or if you are at risk for osteoporosis and fractures, ask your health care provider if you should be screened.  Ask your health care provider whether you should take a calcium or vitamin D supplement to lower your risk for osteoporosis.  Menopause may have certain physical symptoms and risks.  Hormone replacement therapy may reduce some of these symptoms and risks. Talk to your health care provider about whether hormone replacement therapy is right for you. Follow these instructions at home:  Schedule regular health, dental, and eye exams.  Stay current with your immunizations.  Do not use any tobacco products including cigarettes, chewing tobacco, or electronic cigarettes.  If you are pregnant, do not drink alcohol.  If you are breastfeeding, limit how much and how often you drink alcohol.  Limit alcohol intake to no more than 1 drink per day for nonpregnant women. One drink equals 12 ounces of beer, 5 ounces of wine, or 1 ounces of hard liquor.  Do not use street drugs.  Do not share  needles.  Ask your health care provider for help if you need support or information about quitting drugs.  Tell your health care provider if you often feel depressed.  Tell your health care provider if you have ever been abused or do not feel safe at home. This information is not intended to replace advice given to you by your health care provider. Make sure you discuss any questions you have with your health care provider. Document Released: 02/24/2011 Document Revised: 01/17/2016 Document Reviewed: 05/15/2015 Elsevier Interactive Patient Education  2019 [employer].  Carbohydrate Counting for Diabetes Mellitus, Adult  Carbohydrate counting is a method of keeping track of how many carbohydrates you eat. Eating carbohydrates naturally increases the amount of sugar (glucose) in the blood. Counting how many carbohydrates you eat helps keep your blood glucose within normal limits, which helps you manage your diabetes (diabetes mellitus). It is important to know how many carbohydrates you can safely have in each meal. This is different for every person. A diet and nutrition specialist (registered dietitian) can help you make a meal plan and calculate how many carbohydrates you should have at each meal and snack. Carbohydrates are found in the following foods:  Grains, such as breads and cereals.  Dried beans and soy products.  Starchy vegetables, such as potatoes, peas, and corn.  Fruit and fruit juices.  Milk and yogurt.  Sweets and snack foods, such as cake, cookies, candy, chips, and soft drinks. How do I count carbohydrates? There are two ways to count carbohydrates in food. You can use either of the methods or a combination of both. Reading "Nutrition Facts" on packaged food The "Nutrition Facts" list is included on the labels of almost all packaged foods and beverages in the [place] It includes:  The serving size.  Information about nutrients in each serving, including the grams  (g) of carbohydrate per serving. To use the "Nutrition Facts":  Decide how many servings you will have.  Multiply the number of servings by  the number of carbohydrates per serving.  The resulting number is the total amount of carbohydrates that you will be having. Learning standard serving sizes of other foods When you eat carbohydrate foods that are not packaged or do not include "Nutrition Facts" on the label, you need to measure the servings in order to count the amount of carbohydrates:  Measure the foods that you will eat with a food scale or measuring cup, if needed.  Decide how many standard-size servings you will eat.  Multiply the number of servings by 15. Most carbohydrate-rich foods have about 15 g of carbohydrates per serving. ? For example, if you eat 8 oz (170 g) of strawberries, you will have eaten 2 servings and 30 g of carbohydrates (2 servings x 15 g = 30 g).  For foods that have more than one food mixed, such as soups and casseroles, you must count the carbohydrates in each food that is included. The following list contains standard serving sizes of common carbohydrate-rich foods. Each of these servings has about 15 g of carbohydrates:   hamburger bun or  English muffin.   oz (15 mL) syrup.   oz (14 g) jelly.  1 slice of bread.  1 six-inch tortilla.  3 oz (85 g) cooked rice or pasta.  4 oz (113 g) cooked dried beans.  4 oz (113 g) starchy vegetable, such as peas, corn, or potatoes.  4 oz (113 g) hot cereal.  4 oz (113 g) mashed potatoes or  of a large baked potato.  4 oz (113 g) canned or frozen fruit.  4 oz (120 mL) fruit juice.  4-6 crackers.  6 chicken nuggets.  6 oz (170 g) unsweetened dry cereal.  6 oz (170 g) plain fat-free yogurt or yogurt sweetened with artificial sweeteners.  8 oz (240 mL) milk.  8 oz (170 g) fresh fruit or one small piece of fruit.  24 oz (680 g) popped popcorn. Example of carbohydrate counting Sample  meal  3 oz (85 g) chicken breast.  6 oz (170 g) brown rice.  4 oz (113 g) corn.  8 oz (240 mL) milk.  8 oz (170 g) strawberries with sugar-free whipped topping. Carbohydrate calculation 1. Identify the foods that contain carbohydrates: ? Rice. ? Corn. ? Milk. ? Strawberries. 2. Calculate how many servings you have of each food: ? 2 servings rice. ? 1 serving corn. ? 1 serving milk. ? 1 serving strawberries. 3. Multiply each number of servings by 15 g: ? 2 servings rice x 15 g = 30 g. ? 1 serving corn x 15 g = 15 g. ? 1 serving milk x 15 g = 15 g. ? 1 serving strawberries x 15 g = 15 g. 4. Add together all of the amounts to find the total grams of carbohydrates eaten: ? 30 g + 15 g + 15 g + 15 g = 75 g of carbohydrates total. Summary  Carbohydrate counting is a method of keeping track of how many carbohydrates you eat.  Eating carbohydrates naturally increases the amount of sugar (glucose) in the blood.  Counting how many carbohydrates you eat helps keep your blood glucose within normal limits, which helps you manage your diabetes.  A diet and nutrition specialist (registered dietitian) can help you make a meal plan and calculate how many carbohydrates you should have at each meal and snack. This information is not intended to replace advice given to you by your health care provider. Make sure you discuss  any questions you have with your health care provider. Document Released: 08/11/2005 Document Revised: 02/18/2017 Document Reviewed: 01/23/2016 Elsevier Interactive Patient Education  2019 [employer].

## 2019-01-31 NOTE — Progress Notes (Signed)
Katie [name] [birth year] [id number]    History:    Presents for annual exam.  Cycles every 3 to 4 months on continuous OCs.  History of menstrual migraines and menorrhagia which have greatly decreased on OC's,  2008 endometrial ablation with good relief initially, after several years menorrhagiaincreased/BTL.  Normal Pap and mammogram history, overdue for mammogram.  Has not had a screening colonoscopy.  Medical problems include hypercholesteremia, hypothyroidism, risk for type 2 diabetes primary care manages labs and meds.  Blood pressure elevated today relates to rushing, has had normal blood pressures at primary care.  Past medical history, past surgical history, family history and social history were all reviewed and documented in the EPIC chart.  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  ROS:  A ROS was performed and pertinent positives and negatives are included.  Exam:  Vitals:   01/31/19 1502  BP: (!) 160/90  Weight: 181 lb (82.1 kg)  Height: 5\' 4"  (1.626 m)   Body mass index is 31.07 kg/m.   General appearance:  Normal Thyroid:  Symmetrical, normal in size, without palpable masses or nodularity. Respiratory  Auscultation:  Clear without wheezing or rhonchi Cardiovascular  Auscultation:  Regular rate, without rubs, murmurs or gallops  Edema/varicosities:  Not grossly evident Abdominal  Soft,nontender, without masses, guarding or rebound.  Liver/spleen:  No organomegaly noted  Hernia:  None appreciated  Skin  Inspection:  Grossly normal   Breasts: Examined lying and sitting.     Right: Without masses, retractions, discharge or axillary adenopathy.     Left: Without masses, retractions, discharge or axillary adenopathy. Gentitourinary   Inguinal/mons:  Normal without inguinal adenopathy  External genitalia:  Normal  BUS/Urethra/Skene's glands:  Normal  Vagina:  Normal  Cervix:  Normal  Uterus:   normal in size, shape and contour.  Midline and  mobile  Adnexa/parametria:     Rt: Without masses or tenderness.   Lt: Without masses or tenderness.  Anus and perineum: Normal  Digital rectal exam: Normal sphincter tone without palpated masses or tenderness  Assessment/Plan:  53 y.o. MWF G4 P2 for annual exam with no complaints.  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  Having no menopausal symptoms.  Screening colonoscopy reviewed and encouraged, [facility] information given instructed to schedule, mammogram encouraged, [facility] phone number given instructed to schedule.  SBEs, increase exercise and decrease calorie/carbs, vitamin D 1000 daily encouraged.  Reviewed blood pressure elevated, reports rushing normal in the past will check at home has a home monitor will check daily and follow-up with primary care and scheduled office appointment in 1 month.  Encouraged a low-sodium diet with decrease processed foods.  Pap normal 2019, new screening guidelines reviewed.    Huel Cote [facility], 3:50 PM 01/31/2019

## 2019-02-01 LAB — THYROID PANEL WITH TSH
Free Thyroxine Index: 2.5 (ref 1.4–3.8)
T3 Uptake: 23 % (ref 22–35)
T4, Total: 10.7 ug/dL (ref 5.1–11.9)
TSH: 5.68 mIU/L — ABNORMAL HIGH

## 2019-02-03 ENCOUNTER — Other Ambulatory Visit: Payer: Self-pay | Admitting: Family Medicine

## 2019-02-03 DIAGNOSIS — E039 Hypothyroidism, unspecified: Secondary | ICD-10-CM

## 2019-02-03 MED ORDER — LEVOTHYROXINE SODIUM 137 MCG PO TABS: 137.0000 ug | ORAL_TABLET | Freq: Every day | ORAL | 2 refills | Status: DC

## 2019-02-05 ENCOUNTER — Encounter: Payer: Self-pay | Admitting: Family Medicine

## 2019-02-07 ENCOUNTER — Telehealth: Payer: Self-pay | Admitting: Family Medicine

## 2019-02-07 ENCOUNTER — Encounter: Payer: Self-pay | Admitting: Family Medicine

## 2019-02-07 ENCOUNTER — Ambulatory Visit (INDEPENDENT_AMBULATORY_CARE_PROVIDER_SITE_OTHER): Payer: BLUE CROSS/BLUE SHIELD | Admitting: Family Medicine

## 2019-02-07 ENCOUNTER — Other Ambulatory Visit: Payer: Self-pay

## 2019-02-07 VITALS — BP 144/94

## 2019-02-07 DIAGNOSIS — I10 Essential (primary) hypertension: Secondary | ICD-10-CM

## 2019-02-07 MED ORDER — LISINOPRIL 10 MG PO TABS: 10.0000 mg | ORAL_TABLET | Freq: Every day | ORAL | 3 refills | Status: DC

## 2019-02-07 NOTE — Telephone Encounter (Signed)
LVM for pt to schedule BP NV in 2 wks per provider and also lab appt.

## 2019-02-07 NOTE — Progress Notes (Signed)
Virtual Visit via Video Note  I connected with Peri Maris on 02/07/19 at  8:45 AM EDT by a video enabled telemedicine application and verified that I am speaking with the correct person using two identifiers.  Location: Patient: home Provider: hom   I discussed the limitations of evaluation and management by telemedicine and the availability of in person appointments. The patient expressed understanding and agreed to proceed.  History of Present Illness: Pt is home and c/o high bp-- 174/100,  120/80 last night   She has been having some palpitations and headaches She has a hx of pvcs  Past Medical History:  Diagnosis Date  . Allergy   . Anemia   . Anxiety   . Blood transfusion without reported diagnosis   . Hyperlipidemia   . Migraine   . PVC (premature ventricular contraction)   . RLS (restless legs syndrome)   . Thyroid disease    Current Outpatient Medications on File Prior to Visit  Medication Sig Dispense Refill  . cetirizine (ZYRTEC) 10 MG tablet Take 1 tablet (10 mg total) by mouth daily.    Marland Kitchen desvenlafaxine (PRISTIQ) 100 MG 24 hr tablet Take 1 tablet (100 mg total) by mouth daily. 90 tablet 1  . fenofibrate 160 MG tablet Take 1 tablet by mouth daily.    Marland Kitchen levonorgestrel-ethinyl estradiol (ORSYTHIA) 0.1-20 MG-MCG tablet Take 1 tablet by mouth daily.    Marland Kitchen levothyroxine (SYNTHROID) 137 MCG tablet Take 1 tablet (137 mcg total) by mouth daily before breakfast. 30 tablet 2  . QUEtiapine (SEROQUEL) 50 MG tablet Take 1 tablet (50 mg total) by mouth at bedtime. 90 tablet 1  . rosuvastatin (CRESTOR) 10 MG tablet Take 1 tablet (10 mg total) by mouth daily. 90 tablet 1  . valACYclovir (VALTREX) 1000 MG tablet Take 1 tablet (1,000 mg total) by mouth 3 (three) times daily. 30 tablet 2   No current facility-administered medications on file prior to visit.       Observations/Objective:  Today's Vitals   02/07/19 0831  BP: (!) 144/94   There is no height or weight on file to  calculate BMI.   No other vitals obtained Pt in NAD  Assessment and Plan: 1. Essential hypertension Start lisinopril Dash diet  R/u 2-3 weeks  - lisinopril (ZESTRIL) 10 MG tablet; Take 1 tablet (10 mg total) by mouth daily.  Dispense: 90 tablet; Refill: 3  Follow Up Instructions:    I discussed the assessment and treatment plan with the patient. The patient was provided an opportunity to ask questions and all were answered. The patient agreed with the plan and demonstrated an understanding of the instructions.   The patient was advised to call back or seek an in-person evaluation if the symptoms worsen or if the condition fails to improve as anticipated.  I provided 15 minutes of non-face-to-face time during this encounter.   [name], DO

## 2019-02-08 ENCOUNTER — Encounter: Payer: Self-pay | Admitting: Family Medicine

## 2019-02-08 ENCOUNTER — Other Ambulatory Visit: Payer: Self-pay | Admitting: Family Medicine

## 2019-02-08 NOTE — Telephone Encounter (Signed)
She did not happen to check her bp when this happened -- did she? probl should stop it and see if we can see her this week

## 2019-02-09 ENCOUNTER — Encounter: Payer: Self-pay | Admitting: Family Medicine

## 2019-02-10 ENCOUNTER — Ambulatory Visit: Payer: Self-pay | Admitting: *Deleted

## 2019-02-10 ENCOUNTER — Other Ambulatory Visit: Payer: Self-pay

## 2019-02-10 ENCOUNTER — Encounter: Payer: Self-pay | Admitting: Family Medicine

## 2019-02-10 ENCOUNTER — Telehealth: Payer: Self-pay

## 2019-02-10 ENCOUNTER — Ambulatory Visit (INDEPENDENT_AMBULATORY_CARE_PROVIDER_SITE_OTHER): Payer: Self-pay | Admitting: Family Medicine

## 2019-02-10 ENCOUNTER — Telehealth: Payer: Self-pay | Admitting: *Deleted

## 2019-02-10 DIAGNOSIS — R6889 Other general symptoms and signs: Secondary | ICD-10-CM | POA: Diagnosis not present

## 2019-02-10 DIAGNOSIS — Z20822 Contact with and (suspected) exposure to covid-19: Secondary | ICD-10-CM | POA: Insufficient documentation

## 2019-02-10 DIAGNOSIS — I10 Essential (primary) hypertension: Secondary | ICD-10-CM | POA: Insufficient documentation

## 2019-02-10 DIAGNOSIS — Z20828 Contact with and (suspected) exposure to other viral communicable diseases: Secondary | ICD-10-CM

## 2019-02-10 MED ORDER — AMLODIPINE BESYLATE 5 MG PO TABS: 5.0000 mg | ORAL_TABLET | Freq: Every day | ORAL | 3 refills | Status: DC

## 2019-02-10 NOTE — Assessment & Plan Note (Signed)
Pt going home to quarantine  she will be tested

## 2019-02-10 NOTE — Telephone Encounter (Signed)
Per Dr. Etter Sjogren patient has symptoms and exposure. Please test.

## 2019-02-10 NOTE — Assessment & Plan Note (Signed)
Poorly controlled will alter medications, encouraged DASH diet, minimize caffeine and obtain adequate sleep. Report concerning symptoms and follow up as directed and as needed Pt states she had a reaction to the lisinopril ----  Change to norvasc F/u 2 weeks

## 2019-02-10 NOTE — Telephone Encounter (Signed)
Pt called stating that she works in close proximity to someone who tested positive for COVID; she has a cough that started on 02/08/2019 and nausea on 02/09/2019; she has chronic fatigue due to thyroid issues; she would like to be tested for COVID; recommendations made per nurse triage protocol; she verbalized understanding; pt transferred to Rod Holler at [facility] for scheduling.   Reason for Disposition . HIGH RISK patient (e.g., age > 37 years, diabetes, heart or lung disease, weak immune system)  Answer Assessment - Initial Assessment Questions 1. CLOSE CONTACT: "Who is the person with the confirmed or suspected COVID-19 infection that you were exposed to?"     Co-worker 2. PLACE of CONTACT: "Where were you when you were exposed to COVID-19?" (e.g., home, school, medical waiting room; which city?)     Work, [place], [place] 3. TYPE of CONTACT: "How much contact was there?" (e.g., sitting next to, live in same house, work in same office, same building)     Works in close proximity; stands next to the affected person 4. DURATION of CONTACT: "How long were you in contact with the COVID-19 patient?" (e.g., a few seconds, passed by person, a few minutes, live with the patient)     hours 5. DATE of CONTACT: "When did you have contact with a COVID-19 patient?" (e.g., how many days ago)     02/04/2019 6. TRAVEL: "Have you traveled out of the country recently?" If so, "When and where?"     * Also ask about out-of-state travel, since the CDC has identified some high-risk cities for community spread in the [place].     * Note: Travel becomes less relevant if there is widespread community transmission where the patient lives.     no 7. COMMUNITY SPREAD: "Are there lots of cases of COVID-19 (community spread) where you live?" (See public health department website, if unsure)       major 8. SYMPTOMS: "Do you have any symptoms?" (e.g., fever, cough, breathing difficulty)     Cough, nausea 9. PREGNANCY OR  POSTPARTUM: "Is there any chance you are pregnant?" "When was your last menstrual period?" "Did you deliver in the last 2 weeks?"     No, birth control pills 3 month cycle 10. HIGH RISK: "Do you have any heart or lung problems? Do you have a weak immune system?" (e.g., CHF, COPD, asthma, HIV positive, chemotherapy, renal failure, diabetes mellitus, sickle cell anemia)       Hypertension, prediabetic  Protocols used: CORONAVIRUS (COVID-19) DIAGNOSED OR SUSPECTED-A-AH, CORONAVIRUS (COVID-19) EXPOSURE-A-AH

## 2019-02-10 NOTE — Progress Notes (Signed)
Virtual Visit via Video Note  I connected with Katie [name] on 02/10/19 at 10:30 AM EDT by a video enabled telemedicine application and verified that I am speaking with the correct person using two identifiers.  Location: Patient: office  Provider: office    I discussed the limitations of evaluation and management by telemedicine and the availability of in person appointments. The patient expressed understanding and agreed to proceed.  History of Present Illness: Pt coworker tested positive for covid and 3 others are sick.   Pt is also c/o cough , tightness in chest    Observations/Objective: 177/102 80 Pt in NAD Assessment and Plan: 1. Essential hypertension Poorly controlled will alter medications, encouraged DASH diet, minimize caffeine and obtain adequate sleep. Report concerning symptoms and follow up as directed and as needed  - amLODipine (NORVASC) 5 MG tablet; Take 1 tablet (5 mg total) by mouth daily.  Dispense: 30 tablet; Refill: 3  2. Close Exposure to Covid-19 Virus Pt sent to be tested  Pt going home to quarantine    Follow Up Instructions:    I discussed the assessment and treatment plan with the patient. The patient was provided an opportunity to ask questions and all were answered. The patient agreed with the plan and demonstrated an understanding of the instructions.   The patient was advised to call back or seek an in-person evaluation if the symptoms worsen or if the condition fails to improve as anticipated.  I provided 15 minutes of non-face-to-face time during this encounter.   [name], DO

## 2019-02-10 NOTE — Telephone Encounter (Signed)
Pt scheduled for covid testing at [facility] site for today. Requested by Dr. Carollee Herter at [facility] practice. Pt with possible exposure, symptoms.  Testing process reviewed, stay in car, wear mask as well as any other passengers.Pt verbalizes understanding.  Pts CB# [phone]

## 2019-02-12 ENCOUNTER — Encounter (INDEPENDENT_AMBULATORY_CARE_PROVIDER_SITE_OTHER): Payer: Self-pay

## 2019-02-14 ENCOUNTER — Encounter (INDEPENDENT_AMBULATORY_CARE_PROVIDER_SITE_OTHER): Payer: Self-pay

## 2019-02-14 LAB — NOVEL CORONAVIRUS, NAA: SARS-CoV-2, NAA: NOT DETECTED

## 2019-02-15 ENCOUNTER — Encounter (INDEPENDENT_AMBULATORY_CARE_PROVIDER_SITE_OTHER): Payer: Self-pay

## 2019-02-16 ENCOUNTER — Encounter (INDEPENDENT_AMBULATORY_CARE_PROVIDER_SITE_OTHER): Payer: Self-pay

## 2019-02-16 ENCOUNTER — Telehealth: Payer: Self-pay | Admitting: Family Medicine

## 2019-02-16 NOTE — Telephone Encounter (Signed)
Patient returning a call to [place] about scheduling an appointment. Please advise. Attempted office x3, no answer.

## 2019-02-16 NOTE — Telephone Encounter (Signed)
LVM for pt to call office and schedule appt, pt wanted to have bp check.

## 2019-02-17 ENCOUNTER — Encounter (INDEPENDENT_AMBULATORY_CARE_PROVIDER_SITE_OTHER): Payer: Self-pay

## 2019-02-18 ENCOUNTER — Encounter (INDEPENDENT_AMBULATORY_CARE_PROVIDER_SITE_OTHER): Payer: Self-pay

## 2019-02-19 ENCOUNTER — Encounter (INDEPENDENT_AMBULATORY_CARE_PROVIDER_SITE_OTHER): Payer: Self-pay

## 2019-02-21 ENCOUNTER — Encounter (INDEPENDENT_AMBULATORY_CARE_PROVIDER_SITE_OTHER): Payer: Self-pay

## 2019-02-21 NOTE — Telephone Encounter (Signed)
LVM again for pt to schedule BP with [facility]. Done

## 2019-02-22 ENCOUNTER — Encounter (INDEPENDENT_AMBULATORY_CARE_PROVIDER_SITE_OTHER): Payer: Self-pay

## 2019-02-23 ENCOUNTER — Encounter (INDEPENDENT_AMBULATORY_CARE_PROVIDER_SITE_OTHER): Payer: Self-pay

## 2019-02-28 ENCOUNTER — Other Ambulatory Visit: Payer: Self-pay

## 2019-03-10 ENCOUNTER — Encounter: Payer: Self-pay | Admitting: Family Medicine

## 2019-03-10 ENCOUNTER — Ambulatory Visit: Payer: BC Managed Care – PPO | Admitting: Family Medicine

## 2019-03-10 ENCOUNTER — Other Ambulatory Visit: Payer: Self-pay

## 2019-03-10 VITALS — BP 139/86 | HR 79 | Temp 98.6°F | Resp 18 | Ht 64.0 in | Wt 178.4 lb

## 2019-03-10 DIAGNOSIS — F419 Anxiety disorder, unspecified: Secondary | ICD-10-CM

## 2019-03-10 DIAGNOSIS — I10 Essential (primary) hypertension: Secondary | ICD-10-CM | POA: Diagnosis not present

## 2019-03-10 DIAGNOSIS — R079 Chest pain, unspecified: Secondary | ICD-10-CM | POA: Diagnosis not present

## 2019-03-10 DIAGNOSIS — T7840XA Allergy, unspecified, initial encounter: Secondary | ICD-10-CM | POA: Insufficient documentation

## 2019-03-10 DIAGNOSIS — G4700 Insomnia, unspecified: Secondary | ICD-10-CM

## 2019-03-10 DIAGNOSIS — E785 Hyperlipidemia, unspecified: Secondary | ICD-10-CM

## 2019-03-10 MED ORDER — ALPRAZOLAM 0.25 MG PO TABS: 0.2500 mg | ORAL_TABLET | Freq: Two times a day (BID) | ORAL | 0 refills | Status: DC | PRN

## 2019-03-10 MED ORDER — QUETIAPINE FUMARATE 100 MG PO TABS: ORAL_TABLET | ORAL | 1 refills | Status: DC

## 2019-03-10 NOTE — Patient Instructions (Signed)

## 2019-03-10 NOTE — Assessment & Plan Note (Signed)
Tolerating statin, encouraged heart healthy diet, avoid trans fats, minimize simple carbs and saturated fats. Increase exercise as tolerated 

## 2019-03-10 NOTE — Assessment & Plan Note (Signed)
Increase seroquel to 100 mg qhs

## 2019-03-10 NOTE — Progress Notes (Signed)
Patient ID: Katie [name], female    DOB: [birth year]  Age: 53 y.o. MRN: [id number]    Subjective:  Subjective  HPI Katie [name] presents for bp and chol check.  She has been having chest pain   She thinks it may be her anxiety but she is not sure.  It only last a few minutes   Review of Systems  Constitutional: Negative for appetite change, diaphoresis, fatigue and unexpected weight change.  Eyes: Negative for pain, redness and visual disturbance.  Respiratory: Negative for cough, chest tightness, shortness of breath and wheezing.   Cardiovascular: Positive for chest pain. Negative for palpitations and leg swelling.  Endocrine: Negative for cold intolerance, heat intolerance, polydipsia, polyphagia and polyuria.  Genitourinary: Negative for difficulty urinating, dysuria and frequency.  Neurological: Negative for dizziness, light-headedness, numbness and headaches.  Psychiatric/Behavioral: Positive for sleep disturbance. Negative for decreased concentration, self-injury and suicidal ideas. The patient is nervous/anxious.     History Past Medical History:  Diagnosis Date  . Allergy   . Anemia   . Anxiety   . Blood transfusion without reported diagnosis   . Hyperlipidemia   . Migraine   . PVC (premature ventricular contraction)   . RLS (restless legs syndrome)   . Thyroid disease     She has a past surgical history that includes Tubal ligation (04/1999); Carpal tunnel release; Septoplasty (6/05); Cryoablation (11/04); Ectopic pregnancy surgery (1998); Bunionectomy (06/04/2009); and Endometrial ablation.   Her family history includes Alzheimer's disease in her maternal grandmother; Aneurysm in her mother; Cancer in her maternal grandfather, maternal grandmother, paternal grandfather, and paternal grandmother; Depression in her father; Diabetes in her maternal grandmother; Hypertension in her maternal grandmother and sister; Lung cancer in her maternal grandfather; Osteoporosis in her  maternal grandmother; Thyroid disease in her sister; Uterine cancer in her maternal grandmother.She reports that she quit smoking about 24 years ago. She smoked 1.00 pack per day. She quit smokeless tobacco use about 23 years ago. She reports current alcohol use. She reports that she does not use drugs.  Current Outpatient Medications on File Prior to Visit  Medication Sig Dispense Refill  . amLODipine (NORVASC) 5 MG tablet Take 1 tablet (5 mg total) by mouth daily. 30 tablet 3  . cetirizine (ZYRTEC) 10 MG tablet Take 1 tablet (10 mg total) by mouth daily.    Marland Kitchen desvenlafaxine (PRISTIQ) 100 MG 24 hr tablet Take 1 tablet (100 mg total) by mouth daily. 90 tablet 1  . fenofibrate 160 MG tablet Take 1 tablet by mouth daily.    Marland Kitchen levonorgestrel-ethinyl estradiol (ORSYTHIA) 0.1-20 MG-MCG tablet Take 1 tablet by mouth daily.    Marland Kitchen levothyroxine (SYNTHROID) 137 MCG tablet Take 1 tablet (137 mcg total) by mouth daily before breakfast. 30 tablet 2  . rosuvastatin (CRESTOR) 10 MG tablet Take 1 tablet (10 mg total) by mouth daily. 90 tablet 1  . valACYclovir (VALTREX) 1000 MG tablet Take 1 tablet (1,000 mg total) by mouth 3 (three) times daily. 30 tablet 2   No current facility-administered medications on file prior to visit.      Objective:  Objective  Physical Exam Vitals signs and nursing note reviewed.  Constitutional:      Appearance: She is well-developed.  HENT:     Head: Normocephalic and atraumatic.  Eyes:     Conjunctiva/sclera: Conjunctivae normal.  Neck:     Musculoskeletal: Normal range of motion and neck supple.     Thyroid: No thyromegaly.     Vascular: No  carotid bruit or JVD.  Cardiovascular:     Rate and Rhythm: Normal rate and regular rhythm.     Heart sounds: Normal heart sounds. No murmur.  Pulmonary:     Effort: Pulmonary effort is normal. No respiratory distress.     Breath sounds: Normal breath sounds. No wheezing or rales.  Chest:     Chest wall: No tenderness.   Neurological:     Mental Status: She is alert and oriented to person, place, and time.  Psychiatric:        Attention and Perception: Attention and perception normal.        Mood and Affect: Mood is anxious. Affect is not tearful.        Speech: Speech normal.        Behavior: Behavior normal.        Thought Content: Thought content does not include homicidal or suicidal ideation. Thought content does not include homicidal or suicidal plan.        Cognition and Memory: Cognition normal.        Judgment: Judgment normal.    BP 139/86 (BP Location: Left Arm, Patient Position: Sitting, Cuff Size: Normal)   Pulse 79   Temp 98.6 F (37 C) (Oral)   Resp 18   Ht 5\' 4"  (1.626 m)   Wt 178 lb 6.4 oz (80.9 kg)   SpO2 99%   BMI 30.62 kg/m  Wt Readings from Last 3 Encounters:  03/10/19 178 lb 6.4 oz (80.9 kg)  01/31/19 181 lb (82.1 kg)  10/26/18 176 lb 6.4 oz (80 kg)     Lab Results  Component Value Date   WBC 8.4 08/02/2018   HGB 11.0 (L) 08/02/2018   HCT 33.9 (L) 08/02/2018   PLT 441.0 (H) 08/02/2018   GLUCOSE 79 10/12/2018   CHOL 142 10/12/2018   TRIG 114.0 10/12/2018   HDL 49.30 10/12/2018   LDLDIRECT 138.3 06/20/2008   LDLCALC 70 10/12/2018   ALT 13 10/12/2018   AST 17 10/12/2018   NA 138 10/12/2018   K 5.5 (H) 10/12/2018   CL 105 10/12/2018   CREATININE 0.80 10/12/2018   BUN 18 10/12/2018   CO2 25 10/12/2018   TSH 5.68 (H) 01/31/2019   HGBA1C 6.1 08/02/2018   MICROALBUR 1.0 03/18/2018    No results found.   Assessment & Plan:  Plan  I have discontinued Katie [name]'s QUEtiapine. I am also having her start on QUEtiapine and ALPRAZolam. Additionally, I am having her maintain her cetirizine, valACYclovir, desvenlafaxine, rosuvastatin, levothyroxine, fenofibrate, levonorgestrel-ethinyl estradiol, and amLODipine. Meds ordered this encounter  Medications  . QUEtiapine (SEROQUEL) 100 MG tablet    Sig: 1 po qhs    Dispense:  90 tablet    Refill:  1  . ALPRAZolam  (XANAX) 0.25 MG tablet    Sig: Take 1 tablet (0.25 mg total) by mouth 2 (two) times daily as needed for anxiety.    Dispense:  20 tablet    Refill:  0    Problem List Items Addressed This Visit      Unprioritized   Allergy   Anxiety   Relevant Medications   ALPRAZolam (XANAX) 0.25 MG tablet   Chest pain    ekg -- nsr  Pt will try xanax for chest pain for anxiety If no better if worsens --- go to ER Consider stress test / echo      Relevant Orders   EKG 12-Lead (Completed)   HTN (hypertension)    Well controlled,  no changes to meds. Encouraged heart healthy diet such as the DASH diet and exercise as tolerated.        Relevant Orders   Lipid panel   Comprehensive metabolic panel   Hyperlipidemia - Primary    Tolerating statin, encouraged heart healthy diet, avoid trans fats, minimize simple carbs and saturated fats. Increase exercise as tolerated      Relevant Orders   Lipid panel   Comprehensive metabolic panel   Insomnia    Increase seroquel to 100 mg qhs       Relevant Medications   QUEtiapine (SEROQUEL) 100 MG tablet      Follow-up: Return in about 6 months (around 09/10/2019), or if symptoms worsen or fail to improve.  [name], DO

## 2019-03-10 NOTE — Assessment & Plan Note (Signed)
Well controlled, no changes to meds. Encouraged heart healthy diet such as the DASH diet and exercise as tolerated.  °

## 2019-03-10 NOTE — Assessment & Plan Note (Signed)
ekg -- nsr  Pt will try xanax for chest pain for anxiety If no better if worsens --- go to ER Consider stress test / echo

## 2019-03-18 ENCOUNTER — Encounter: Payer: Self-pay | Admitting: Family Medicine

## 2019-03-21 MED ORDER — LEVOTHYROXINE SODIUM 137 MCG PO TABS: 137.0000 ug | ORAL_TABLET | Freq: Every day | ORAL | 2 refills | Status: DC

## 2019-03-23 ENCOUNTER — Other Ambulatory Visit: Payer: Self-pay | Admitting: Family Medicine

## 2019-03-23 ENCOUNTER — Encounter: Payer: Self-pay | Admitting: Family Medicine

## 2019-03-23 DIAGNOSIS — F419 Anxiety disorder, unspecified: Secondary | ICD-10-CM

## 2019-03-23 NOTE — Telephone Encounter (Signed)
Alprazolam refill.   Last OV: 03/10/2019 Last Fill: 03/10/2019 #20 and 0RF Pt sig: 1 tab bid prn UDS: None

## 2019-03-24 MED ORDER — ALPRAZOLAM 0.25 MG PO TABS: 0.2500 mg | ORAL_TABLET | Freq: Two times a day (BID) | ORAL | 0 refills | Status: DC | PRN

## 2019-03-24 NOTE — Telephone Encounter (Signed)
Requesting: Xanax  Contract: N/A UDS: N/A Last OV: 03/10/2019 Next OV: 04/07/2019 Last Refill: 03/10/2019, #20--0 RF Database:   Please advise

## 2019-04-05 ENCOUNTER — Other Ambulatory Visit (INDEPENDENT_AMBULATORY_CARE_PROVIDER_SITE_OTHER): Payer: BC Managed Care – PPO

## 2019-04-05 ENCOUNTER — Other Ambulatory Visit: Payer: Self-pay

## 2019-04-05 DIAGNOSIS — E079 Disorder of thyroid, unspecified: Secondary | ICD-10-CM | POA: Diagnosis not present

## 2019-04-05 DIAGNOSIS — I10 Essential (primary) hypertension: Secondary | ICD-10-CM

## 2019-04-05 DIAGNOSIS — E039 Hypothyroidism, unspecified: Secondary | ICD-10-CM | POA: Diagnosis not present

## 2019-04-05 DIAGNOSIS — E785 Hyperlipidemia, unspecified: Secondary | ICD-10-CM

## 2019-04-05 LAB — LIPID PANEL
Cholesterol: 148 mg/dL (ref 0–200)
HDL: 48.2 mg/dL (ref >39.00)
LDL Cholesterol: 63 mg/dL (ref 0–99)
NonHDL: 99.39
Total CHOL/HDL Ratio: 3
Triglycerides: 180 mg/dL — ABNORMAL HIGH (ref 0.0–149.0)
VLDL: 36 mg/dL (ref 0.0–40.0)

## 2019-04-05 LAB — COMPREHENSIVE METABOLIC PANEL
ALT: 14 U/L (ref 0–35)
AST: 17 U/L (ref 0–37)
Albumin: 4.3 g/dL (ref 3.5–5.2)
Alkaline Phosphatase: 42 U/L (ref 39–117)
BUN: 15 mg/dL (ref 6–23)
CO2: 26 mEq/L (ref 19–32)
Calcium: 9.3 mg/dL (ref 8.4–10.5)
Chloride: 102 mEq/L (ref 96–112)
Creatinine, Ser: 0.76 mg/dL (ref 0.40–1.20)
GFR: 80.25 mL/min (ref >60.00)
Glucose, Bld: 95 mg/dL (ref 70–99)
Potassium: 3.9 mEq/L (ref 3.5–5.1)
Sodium: 137 mEq/L (ref 135–145)
Total Bilirubin: 0.4 mg/dL (ref 0.2–1.2)
Total Protein: 7.3 g/dL (ref 6.0–8.3)

## 2019-04-06 LAB — THYROID PANEL WITH TSH
Free Thyroxine Index: 2.6 (ref 1.4–3.8)
T3 Uptake: 22 % (ref 22–35)
T4, Total: 12 ug/dL — ABNORMAL HIGH (ref 5.1–11.9)
TSH: 2.54 mIU/L

## 2019-04-07 ENCOUNTER — Other Ambulatory Visit: Payer: Self-pay

## 2019-04-07 ENCOUNTER — Ambulatory Visit (INDEPENDENT_AMBULATORY_CARE_PROVIDER_SITE_OTHER): Payer: BC Managed Care – PPO | Admitting: Family Medicine

## 2019-04-07 ENCOUNTER — Encounter: Payer: Self-pay | Admitting: Family Medicine

## 2019-04-07 VITALS — BP 141/84 | HR 84 | Temp 97.8°F | Resp 18 | Ht 64.0 in | Wt 181.4 lb

## 2019-04-07 DIAGNOSIS — Z0000 Encounter for general adult medical examination without abnormal findings: Secondary | ICD-10-CM

## 2019-04-07 DIAGNOSIS — E1165 Type 2 diabetes mellitus with hyperglycemia: Secondary | ICD-10-CM

## 2019-04-07 NOTE — Patient Instructions (Addendum)
Preventive Care 40-51 Years Old, Female Preventive care refers to visits with your health care provider and lifestyle choices that can promote health and wellness. This includes:  A yearly physical exam. This may also be called an annual well check.  Regular dental visits and eye exams.  Immunizations.  Screening for certain conditions.  Healthy lifestyle choices, such as eating a healthy diet, getting regular exercise, not using drugs or products that contain nicotine and tobacco, and limiting alcohol use. What can I expect for my preventive care visit? Physical exam Your health care provider will check your:  Height and weight. This may be used to calculate body mass index (BMI), which tells if you are at a healthy weight.  Heart rate and blood pressure.  Skin for abnormal spots. Counseling Your health care provider may ask you questions about your:  Alcohol, tobacco, and drug use.  Emotional well-being.  Home and relationship well-being.  Sexual activity.  Eating habits.  Work and work environment.  Method of birth control.  Menstrual cycle.  Pregnancy history. What immunizations do I need?  Influenza (flu) vaccine  This is recommended every year. Tetanus, diphtheria, and pertussis (Tdap) vaccine  You may need a Td booster every 10 years. Varicella (chickenpox) vaccine  You may need this if you have not been vaccinated. Zoster (shingles) vaccine  You may need this after age 60. Measles, mumps, and rubella (MMR) vaccine  You may need at least one dose of MMR if you were born in 1957 or later. You may also need a second dose. Pneumococcal conjugate (PCV13) vaccine  You may need this if you have certain conditions and were not previously vaccinated. Pneumococcal polysaccharide (PPSV23) vaccine  You may need one or two doses if you smoke cigarettes or if you have certain conditions. Meningococcal conjugate (MenACWY) vaccine  You may need this if you  have certain conditions. Hepatitis A vaccine  You may need this if you have certain conditions or if you travel or work in places where you may be exposed to hepatitis A. Hepatitis B vaccine  You may need this if you have certain conditions or if you travel or work in places where you may be exposed to hepatitis B. Haemophilus influenzae type b (Hib) vaccine  You may need this if you have certain conditions. Human papillomavirus (HPV) vaccine  If recommended by your health care provider, you may need three doses over 6 months. You may receive vaccines as individual doses or as more than one vaccine together in one shot (combination vaccines). Talk with your health care provider about the risks and benefits of combination vaccines. What tests do I need? Blood tests  Lipid and cholesterol levels. These may be checked every 5 years, or more frequently if you are over 50 years old.  Hepatitis C test.  Hepatitis B test. Screening  Lung cancer screening. You may have this screening every year starting at age 55 if you have a 30-pack-year history of smoking and currently smoke or have quit within the past 15 years.  Colorectal cancer screening. All adults should have this screening starting at age 50 and continuing until age 75. Your health care provider may recommend screening at age 45 if you are at increased risk. You will have tests every 1-10 years, depending on your results and the type of screening test.  Diabetes screening. This is done by checking your blood sugar (glucose) after you have not eaten for a while (fasting). You may have this   done every 1-3 years.  Mammogram. This may be done every 1-2 years. Talk with your health care provider about when you should start having regular mammograms. This may depend on whether you have a family history of breast cancer.  BRCA-related cancer screening. This may be done if you have a family history of breast, ovarian, tubal, or peritoneal  cancers.  Pelvic exam and Pap test. This may be done every 3 years starting at age 21. Starting at age 30, this may be done every 5 years if you have a Pap test in combination with an HPV test. Other tests  Sexually transmitted disease (STD) testing.  Bone density scan. This is done to screen for osteoporosis. You may have this scan if you are at high risk for osteoporosis. Follow these instructions at home: Eating and drinking  Eat a diet that includes fresh fruits and vegetables, whole grains, lean protein, and low-fat dairy.  Take vitamin and mineral supplements as recommended by your health care provider.  Do not drink alcohol if: ? Your health care provider tells you not to drink. ? You are pregnant, may be pregnant, or are planning to become pregnant.  If you drink alcohol: ? Limit how much you have to 0-1 drink a day. ? Be aware of how much alcohol is in your drink. In the [place] one drink equals one 12 oz bottle of beer (355 mL), one 5 oz glass of wine (148 mL), or one 1 oz glass of hard liquor (44 mL). Lifestyle  Take daily care of your teeth and gums.  Stay active. Exercise for at least 30 minutes on 5 or more days each week.  Do not use any products that contain nicotine or tobacco, such as cigarettes, e-cigarettes, and chewing tobacco. If you need help quitting, ask your health care provider.  If you are sexually active, practice safe sex. Use a condom or other form of birth control (contraception) in order to prevent pregnancy and STIs (sexually transmitted infections).  If told by your health care provider, take low-dose aspirin daily starting at age 50. What's next?  Visit your health care provider once a year for a well check visit.  Ask your health care provider how often you should have your eyes and teeth checked.  Stay up to date on all vaccines. This information is not intended to replace advice given to you by your health care provider. Make sure you  discuss any questions you have with your health care provider. Document Released: 09/07/2015 Document Revised: 04/22/2018 Document Reviewed: 04/22/2018 Elsevier Patient Education  2020 [employer]  Preventive Care 40-51 Years Old, Female Preventive care refers to visits with your health care provider and lifestyle choices that can promote health and wellness. This includes:  A yearly physical exam. This may also be called an annual well check.  Regular dental visits and eye exams.  Immunizations.  Screening for certain conditions.  Healthy lifestyle choices, such as eating a healthy diet, getting regular exercise, not using drugs or products that contain nicotine and tobacco, and limiting alcohol use. What can I expect for my preventive care visit? Physical exam Your health care provider will check your:  Height and weight. This may be used to calculate body mass index (BMI), which tells if you are at a healthy weight.  Heart rate and blood pressure.  Skin for abnormal spots. Counseling Your health care provider may ask you questions about your:  Alcohol, tobacco, and drug use.  Emotional well-being.    Home and relationship well-being.  Sexual activity.  Eating habits.  Work and work Statistician.  Method of birth control.  Menstrual cycle.  Pregnancy history. What immunizations do I need?  Influenza (flu) vaccine  This is recommended every year. Tetanus, diphtheria, and pertussis (Tdap) vaccine  You may need a Td booster every 10 years. Varicella (chickenpox) vaccine  You may need this if you have not been vaccinated. Zoster (shingles) vaccine  You may need this after age 48. Measles, mumps, and rubella (MMR) vaccine  You may need at least one dose of MMR if you were born in 1957 or later. You may also need a second dose. Pneumococcal conjugate (PCV13) vaccine  You may need this if you have certain conditions and were not previously vaccinated.  Pneumococcal polysaccharide (PPSV23) vaccine  You may need one or two doses if you smoke cigarettes or if you have certain conditions. Meningococcal conjugate (MenACWY) vaccine  You may need this if you have certain conditions. Hepatitis A vaccine  You may need this if you have certain conditions or if you travel or work in places where you may be exposed to hepatitis A. Hepatitis B vaccine  You may need this if you have certain conditions or if you travel or work in places where you may be exposed to hepatitis B. Haemophilus influenzae type b (Hib) vaccine  You may need this if you have certain conditions. Human papillomavirus (HPV) vaccine  If recommended by your health care provider, you may need three doses over 6 months. You may receive vaccines as individual doses or as more than one vaccine together in one shot (combination vaccines). Talk with your health care provider about the risks and benefits of combination vaccines. What tests do I need? Blood tests  Lipid and cholesterol levels. These may be checked every 5 years, or more frequently if you are over 31 years old.  Hepatitis C test.  Hepatitis B test. Screening  Lung cancer screening. You may have this screening every year starting at age 24 if you have a 30-pack-year history of smoking and currently smoke or have quit within the past 15 years.  Colorectal cancer screening. All adults should have this screening starting at age 54 and continuing until age 1. Your health care provider may recommend screening at age 74 if you are at increased risk. You will have tests every 1-10 years, depending on your results and the type of screening test.  Diabetes screening. This is done by checking your blood sugar (glucose) after you have not eaten for a while (fasting). You may have this done every 1-3 years.  Mammogram. This may be done every 1-2 years. Talk with your health care provider about when you should start having  regular mammograms. This may depend on whether you have a family history of breast cancer.  BRCA-related cancer screening. This may be done if you have a family history of breast, ovarian, tubal, or peritoneal cancers.  Pelvic exam and Pap test. This may be done every 3 years starting at age 45. Starting at age 28, this may be done every 5 years if you have a Pap test in combination with an HPV test. Other tests  Sexually transmitted disease (STD) testing.  Bone density scan. This is done to screen for osteoporosis. You may have this scan if you are at high risk for osteoporosis. Follow these instructions at home: Eating and drinking  Eat a diet that includes fresh fruits and vegetables, whole grains, lean  protein, and low-fat dairy.  Take vitamin and mineral supplements as recommended by your health care provider.  Do not drink alcohol if: ? Your health care provider tells you not to drink. ? You are pregnant, may be pregnant, or are planning to become pregnant.  If you drink alcohol: ? Limit how much you have to 0-1 drink a day. ? Be aware of how much alcohol is in your drink. In the [place] one drink equals one 12 oz bottle of beer (355 mL), one 5 oz glass of wine (148 mL), or one 1 oz glass of hard liquor (44 mL). Lifestyle  Take daily care of your teeth and gums.  Stay active. Exercise for at least 30 minutes on 5 or more days each week.  Do not use any products that contain nicotine or tobacco, such as cigarettes, e-cigarettes, and chewing tobacco. If you need help quitting, ask your health care provider.  If you are sexually active, practice safe sex. Use a condom or other form of birth control (contraception) in order to prevent pregnancy and STIs (sexually transmitted infections).  If told by your health care provider, take low-dose aspirin daily starting at age 28. What's next?  Visit your health care provider once a year for a well check visit.  Ask your health care  provider how often you should have your eyes and teeth checked.  Stay up to date on all vaccines. This information is not intended to replace advice given to you by your health care provider. Make sure you discuss any questions you have with your health care provider. Document Released: 09/07/2015 Document Revised: 04/22/2018 Document Reviewed: 04/22/2018 Elsevier Patient Education  2020 [employer].

## 2019-04-07 NOTE — Progress Notes (Signed)
-Subjective:     Katie [name] is a 53 y.o. female and is here for a comprehensive physical exam. The patient reports no problems.  Social History   Socioeconomic History  . Marital status: Married    Spouse name: Not on file  . Number of children: 2  . Years of education: 53  . Highest education level: Not on file  Occupational History  . Occupation: Marketing executive [employer]  Social Needs  . Financial resource strain: Not on file  . Food insecurity    Worry: Not on file    Inability: Not on file  . Transportation needs    Medical: Not on file    Non-medical: Not on file  Tobacco Use  . Smoking status: Former Smoker    Packs/day: 1.00    Quit date: 01/14/1995    Years since quitting: 24.2  . Smokeless tobacco: Former Systems developer    Quit date: 08/26/1995  . Tobacco comment: started smoking at age 60 1ppd  Substance and Sexual Activity  . Alcohol use: Yes    Comment: Rare  . Drug use: No  . Sexual activity: Not Currently    Partners: Male    Comment: 1st intercourse 53 yo-More than 5 partners-BTL  Lifestyle  . Physical activity    Days per week: Not on file    Minutes per session: Not on file  . Stress: Not on file  Relationships  . Social Herbalist on phone: Not on file    Gets together: Not on file    Attends religious service: Not on file    Active member of club or organization: Not on file    Attends meetings of clubs or organizations: Not on file    Relationship status: Not on file  . Intimate partner violence    Fear of current or ex partner: Not on file    Emotionally abused: Not on file    Physically abused: Not on file    Forced sexual activity: Not on file  Other Topics Concern  . Not on file  Social History Narrative   Exercise-- no   Health Maintenance  Topic Date Due  . OPHTHALMOLOGY EXAM  04/24/1978  . MAMMOGRAM  11/11/2017  . COLONOSCOPY  04/24/2018  . HEMOGLOBIN A1C  02/01/2019  . FOOT EXAM  03/19/2019  . URINE MICROALBUMIN  03/19/2019   . INFLUENZA VACCINE  03/26/2019  . HIV Screening  03/18/2024 (Originally 04/25/1983)  . PAP SMEAR-Modifier  01/26/2021  . TETANUS/TDAP  01/27/2028  . PNEUMOCOCCAL POLYSACCHARIDE VACCINE AGE 58-64 HIGH RISK  Completed    The following portions of the patient's history were reviewed and updated as appropriate:  She  has a past medical history of Allergy, Anemia, Anxiety, Blood transfusion without reported diagnosis, Hyperlipidemia, Migraine, PVC (premature ventricular contraction), RLS (restless legs syndrome), and Thyroid disease. She does not have any pertinent problems on file. She  has a past surgical history that includes Tubal ligation (04/1999); Carpal tunnel release; Septoplasty (6/05); Cryoablation (11/04); Ectopic pregnancy surgery (1998); Bunionectomy (06/04/2009); and Endometrial ablation. Her family history includes Alzheimer's disease in her maternal grandmother; Aneurysm in her mother; Cancer in her maternal grandfather, maternal grandmother, paternal grandfather, and paternal grandmother; Depression in her father; Diabetes in her maternal grandmother; Hypertension in her maternal grandmother and sister; Lung cancer in her maternal grandfather; Osteoporosis in her maternal grandmother; Thyroid disease in her sister; Uterine cancer in her maternal grandmother. She  reports that she quit smoking about 24  years ago. She smoked 1.00 pack per day. She quit smokeless tobacco use about 23 years ago. She reports current alcohol use. She reports that she does not use drugs. She has a current medication list which includes the following prescription(s): alprazolam, amlodipine, cetirizine, desvenlafaxine, fenofibrate, levonorgestrel-ethinyl estradiol, levothyroxine, quetiapine, rosuvastatin, and valacyclovir. Current Outpatient Medications on File Prior to Visit  Medication Sig Dispense Refill  . ALPRAZolam (XANAX) 0.25 MG tablet Take 1 tablet (0.25 mg total) by mouth 2 (two) times daily as needed  for anxiety. 20 tablet 0  . amLODipine (NORVASC) 5 MG tablet Take 1 tablet (5 mg total) by mouth daily. 30 tablet 3  . cetirizine (ZYRTEC) 10 MG tablet Take 1 tablet (10 mg total) by mouth daily.    Marland Kitchen desvenlafaxine (PRISTIQ) 100 MG 24 hr tablet Take 1 tablet (100 mg total) by mouth daily. 90 tablet 1  . fenofibrate 160 MG tablet Take 1 tablet by mouth daily.    Marland Kitchen levonorgestrel-ethinyl estradiol (ORSYTHIA) 0.1-20 MG-MCG tablet Take 1 tablet by mouth daily.    Marland Kitchen levothyroxine (SYNTHROID) 137 MCG tablet Take 1 tablet (137 mcg total) by mouth daily before breakfast. 30 tablet 2  . QUEtiapine (SEROQUEL) 100 MG tablet 1 po qhs 90 tablet 1  . rosuvastatin (CRESTOR) 10 MG tablet Take 1 tablet (10 mg total) by mouth daily. 90 tablet 1  . valACYclovir (VALTREX) 1000 MG tablet Take 1 tablet (1,000 mg total) by mouth 3 (three) times daily. 30 tablet 2   No current facility-administered medications on file prior to visit.    She is allergic to morphine and lisinopril..   Review of Systems Review of Systems  Constitutional: Negative for activity change, appetite change and fatigue.  HENT: Negative for hearing loss, congestion, tinnitus and ear discharge.  dentist q67m Eyes: Negative for visual disturbance (see optho q1y -- vision corrected to 20/20 with glasses).  Respiratory: Negative for cough, chest tightness and shortness of breath.   Cardiovascular: Negative for chest pain, palpitations and leg swelling.  Gastrointestinal: Negative for abdominal pain, diarrhea, constipation and abdominal distention.  Genitourinary: Negative for urgency, frequency, decreased urine volume and difficulty urinating.  Musculoskeletal: Negative for back pain, arthralgias and gait problem.  Skin: Negative for color change, pallor and rash.  Neurological: Negative for dizziness, light-headedness, numbness and headaches.  Hematological: Negative for adenopathy. Does not bruise/bleed easily.  Psychiatric/Behavioral:  Negative for suicidal ideas, confusion, sleep disturbance, self-injury, dysphoric mood, decreased concentration and agitation.       Objective:    BP (!) 141/84 (BP Location: Left Arm, Patient Position: Sitting, Cuff Size: Normal)   Pulse 84   Temp 97.8 F (36.6 C) (Oral)   Resp 18   Ht 5\' 4"  (1.626 m)   Wt 181 lb 6.4 oz (82.3 kg)   SpO2 99%   BMI 31.14 kg/m  General appearance: alert, cooperative, appears stated age and no distress Head: Normocephalic, without obvious abnormality, atraumatic Eyes: conjunctivae/corneas clear. PERRL, EOM's intact. Fundi benign. Ears: normal TM's and external ear canals both ears Nose: Nares normal. Septum midline. Mucosa normal. No drainage or sinus tenderness. Throat: lips, mucosa, and tongue normal; teeth and gums normal Neck: no adenopathy, no carotid bruit, no JVD, supple, symmetrical, trachea midline and thyroid not enlarged, symmetric, no tenderness/mass/nodules Back: symmetric, no curvature. ROM normal. No CVA tenderness. Lungs: clear to auscultation bilaterally Breasts: gyn Heart: regular rate and rhythm, S1, S2 normal, no murmur, click, rub or gallop Abdomen: soft, non-tender; bowel sounds normal; no masses,  no organomegaly Pelvic: deferred--gyn Extremities: extremities normal, atraumatic, no cyanosis or edema Pulses: 2+ and symmetric Skin: Skin color, texture, turgor normal. No rashes or lesions Lymph nodes: Cervical, supraclavicular, and axillary nodes normal. Neurologic: Alert and oriented X 3, normal strength and tone. Normal symmetric reflexes. Normal coordination and gait    Assessment:    Healthy female exam.      Plan:     ghm utd Check labs  See After Visit Summary for Counseling Recommendations    1. Preventative health care See above - Ambulatory referral to Gastroenterology - Hemoglobin A1c - CBC with Differential/Platelet  2. Uncontrolled type 2 diabetes mellitus with hyperglycemia (HCC) Check labs  -  Hemoglobin A1c

## 2019-04-08 LAB — CBC WITH DIFFERENTIAL/PLATELET
Basophils Absolute: 0.1 10*3/uL (ref 0.0–0.1)
Basophils Relative: 1.1 % (ref 0.0–3.0)
Eosinophils Absolute: 0.1 10*3/uL (ref 0.0–0.7)
Eosinophils Relative: 2.4 % (ref 0.0–5.0)
HCT: 33.7 % — ABNORMAL LOW (ref 36.0–46.0)
Hemoglobin: 10.8 g/dL — ABNORMAL LOW (ref 12.0–15.0)
Lymphocytes Relative: 36.3 % (ref 12.0–46.0)
Lymphs Abs: 2.3 10*3/uL (ref 0.7–4.0)
MCHC: 32 g/dL (ref 30.0–36.0)
MCV: 74.1 fl — ABNORMAL LOW (ref 78.0–100.0)
Monocytes Absolute: 0.5 10*3/uL (ref 0.1–1.0)
Monocytes Relative: 7.5 % (ref 3.0–12.0)
Neutro Abs: 3.3 10*3/uL (ref 1.4–7.7)
Neutrophils Relative %: 52.7 % (ref 43.0–77.0)
Platelets: 404 10*3/uL — ABNORMAL HIGH (ref 150.0–400.0)
RBC: 4.55 Mil/uL (ref 3.87–5.11)
RDW: 16.3 % — ABNORMAL HIGH (ref 11.5–15.5)
WBC: 6.2 10*3/uL (ref 4.0–10.5)

## 2019-04-08 LAB — HEMOGLOBIN A1C: Hgb A1c MFr Bld: 6.2 % (ref 4.6–6.5)

## 2019-04-11 ENCOUNTER — Encounter: Payer: Self-pay | Admitting: Family Medicine

## 2019-04-11 ENCOUNTER — Other Ambulatory Visit: Payer: Self-pay | Admitting: Family Medicine

## 2019-04-11 DIAGNOSIS — F419 Anxiety disorder, unspecified: Secondary | ICD-10-CM

## 2019-04-11 DIAGNOSIS — D649 Anemia, unspecified: Secondary | ICD-10-CM

## 2019-04-11 MED ORDER — ALPRAZOLAM 0.25 MG PO TABS: 0.2500 mg | ORAL_TABLET | Freq: Two times a day (BID) | ORAL | 0 refills | Status: DC | PRN

## 2019-04-11 NOTE — Telephone Encounter (Signed)
done

## 2019-04-12 ENCOUNTER — Encounter: Payer: Self-pay | Admitting: Family Medicine

## 2019-05-04 ENCOUNTER — Encounter: Payer: Self-pay | Admitting: Family Medicine

## 2019-05-05 ENCOUNTER — Other Ambulatory Visit (INDEPENDENT_AMBULATORY_CARE_PROVIDER_SITE_OTHER): Payer: BC Managed Care – PPO

## 2019-05-05 DIAGNOSIS — D649 Anemia, unspecified: Secondary | ICD-10-CM

## 2019-05-05 LAB — FECAL OCCULT BLOOD, IMMUNOCHEMICAL: Fecal Occult Bld: NEGATIVE

## 2019-05-05 MED ORDER — FENOFIBRATE 160 MG PO TABS: 160.0000 mg | ORAL_TABLET | Freq: Every day | ORAL | 1 refills | Status: DC

## 2019-05-18 ENCOUNTER — Encounter: Payer: Self-pay | Admitting: Gynecology

## 2019-05-31 ENCOUNTER — Encounter: Payer: Self-pay | Admitting: Family Medicine

## 2019-05-31 ENCOUNTER — Other Ambulatory Visit: Payer: Self-pay | Admitting: *Deleted

## 2019-05-31 DIAGNOSIS — I10 Essential (primary) hypertension: Secondary | ICD-10-CM

## 2019-05-31 DIAGNOSIS — F32A Depression, unspecified: Secondary | ICD-10-CM

## 2019-05-31 DIAGNOSIS — F329 Major depressive disorder, single episode, unspecified: Secondary | ICD-10-CM

## 2019-05-31 MED ORDER — AMLODIPINE BESYLATE 5 MG PO TABS: 5.0000 mg | ORAL_TABLET | Freq: Every day | ORAL | 1 refills | Status: DC

## 2019-05-31 MED ORDER — DESVENLAFAXINE SUCCINATE ER 100 MG PO TB24: 100.0000 mg | ORAL_TABLET | Freq: Every day | ORAL | 1 refills | Status: DC

## 2019-07-08 ENCOUNTER — Encounter: Payer: Self-pay | Admitting: Family Medicine

## 2019-07-11 ENCOUNTER — Encounter: Payer: Self-pay | Admitting: Family Medicine

## 2019-07-11 DIAGNOSIS — E785 Hyperlipidemia, unspecified: Secondary | ICD-10-CM

## 2019-07-11 MED ORDER — ROSUVASTATIN CALCIUM 10 MG PO TABS: 10.0000 mg | ORAL_TABLET | Freq: Every day | ORAL | 1 refills | Status: DC

## 2019-07-12 ENCOUNTER — Other Ambulatory Visit: Payer: Self-pay | Admitting: Family Medicine

## 2019-07-12 DIAGNOSIS — F419 Anxiety disorder, unspecified: Secondary | ICD-10-CM

## 2019-07-12 MED ORDER — ALPRAZOLAM 0.25 MG PO TABS: 0.2500 mg | ORAL_TABLET | Freq: Two times a day (BID) | ORAL | 0 refills | Status: DC | PRN

## 2019-08-21 ENCOUNTER — Encounter: Payer: Self-pay | Admitting: Family Medicine

## 2019-08-21 DIAGNOSIS — B001 Herpesviral vesicular dermatitis: Secondary | ICD-10-CM

## 2019-08-22 MED ORDER — VALACYCLOVIR HCL 1 G PO TABS: 1000.0000 mg | ORAL_TABLET | Freq: Three times a day (TID) | ORAL | 2 refills | Status: DC

## 2019-08-22 MED ORDER — LEVOTHYROXINE SODIUM 137 MCG PO TABS: 137.0000 ug | ORAL_TABLET | Freq: Every day | ORAL | 2 refills | Status: DC

## 2019-09-16 ENCOUNTER — Encounter: Payer: Self-pay | Admitting: Family Medicine

## 2019-09-16 DIAGNOSIS — G4700 Insomnia, unspecified: Secondary | ICD-10-CM

## 2019-09-16 MED ORDER — QUETIAPINE FUMARATE 100 MG PO TABS: 100.0000 mg | ORAL_TABLET | Freq: Every day | ORAL | 0 refills | Status: DC

## 2019-10-10 ENCOUNTER — Ambulatory Visit (INDEPENDENT_AMBULATORY_CARE_PROVIDER_SITE_OTHER): Payer: BC Managed Care – PPO | Admitting: Family Medicine

## 2019-10-10 ENCOUNTER — Encounter: Payer: Self-pay | Admitting: Family Medicine

## 2019-10-10 ENCOUNTER — Other Ambulatory Visit: Payer: Self-pay

## 2019-10-10 VITALS — BP 122/72 | HR 97 | Temp 96.6°F | Ht 64.0 in | Wt 180.4 lb

## 2019-10-10 DIAGNOSIS — G4700 Insomnia, unspecified: Secondary | ICD-10-CM

## 2019-10-10 DIAGNOSIS — R739 Hyperglycemia, unspecified: Secondary | ICD-10-CM | POA: Diagnosis not present

## 2019-10-10 DIAGNOSIS — F419 Anxiety disorder, unspecified: Secondary | ICD-10-CM

## 2019-10-10 DIAGNOSIS — Z79899 Other long term (current) drug therapy: Secondary | ICD-10-CM

## 2019-10-10 DIAGNOSIS — I10 Essential (primary) hypertension: Secondary | ICD-10-CM

## 2019-10-10 DIAGNOSIS — E039 Hypothyroidism, unspecified: Secondary | ICD-10-CM

## 2019-10-10 DIAGNOSIS — D649 Anemia, unspecified: Secondary | ICD-10-CM

## 2019-10-10 DIAGNOSIS — E785 Hyperlipidemia, unspecified: Secondary | ICD-10-CM

## 2019-10-10 MED ORDER — QUETIAPINE FUMARATE 100 MG PO TABS: 100.0000 mg | ORAL_TABLET | Freq: Every day | ORAL | 1 refills | Status: DC

## 2019-10-10 NOTE — Assessment & Plan Note (Signed)
Well controlled, no changes to meds. Encouraged heart healthy diet such as the DASH diet and exercise as tolerated.  °

## 2019-10-10 NOTE — Patient Instructions (Signed)

## 2019-10-10 NOTE — Assessment & Plan Note (Signed)
Lab Results  Component Value Date   CHOL 148 04/05/2019   HDL 48.20 04/05/2019   LDLCALC 63 04/05/2019   LDLDIRECT 138.3 06/20/2008   TRIG 180.0 (H) 04/05/2019   CHOLHDL 3 04/05/2019

## 2019-10-10 NOTE — Assessment & Plan Note (Signed)
Stable Refill xanax Database reviewed Contract and uds done

## 2019-10-10 NOTE — Progress Notes (Signed)
Patient ID: Katie [name], female    DOB: November 24, 1965  Age: 54 y.o. MRN: [id number]    Subjective:  Subjective  HPI Katie [name] presents for f/u meds and refills.  We also need a new contract and uds  Review of Systems  Constitutional: Negative for appetite change, diaphoresis, fatigue and unexpected weight change.  Eyes: Negative for pain, redness and visual disturbance.  Respiratory: Negative for cough, chest tightness, shortness of breath and wheezing.   Cardiovascular: Negative for chest pain, palpitations and leg swelling.  Endocrine: Negative for cold intolerance, heat intolerance, polydipsia, polyphagia and polyuria.  Genitourinary: Negative for difficulty urinating, dysuria and frequency.  Neurological: Negative for dizziness, light-headedness, numbness and headaches.    History Past Medical History:  Diagnosis Date  . Allergy   . Anemia   . Anxiety   . Blood transfusion without reported diagnosis   . Hyperlipidemia   . Migraine   . PVC (premature ventricular contraction)   . RLS (restless legs syndrome)   . Thyroid disease     She has a past surgical history that includes Tubal ligation (04/1999); Carpal tunnel release; Septoplasty (6/05); Cryoablation (11/04); Ectopic pregnancy surgery (1998); Bunionectomy (06/04/2009); and Endometrial ablation.   Her family history includes Alzheimer's disease in her maternal grandmother; Aneurysm in her mother; Cancer in her maternal grandfather, maternal grandmother, paternal grandfather, and paternal grandmother; Depression in her father; Diabetes in her maternal grandmother; Hypertension in her maternal grandmother and sister; Lung cancer in her maternal grandfather; Osteoporosis in her maternal grandmother; Thyroid disease in her sister; Uterine cancer in her maternal grandmother.She reports that she quit smoking about 24 years ago. She smoked 1.00 pack per day. She quit smokeless tobacco use about 24 years ago. She reports current  alcohol use. She reports that she does not use drugs.  Current Outpatient Medications on File Prior to Visit  Medication Sig Dispense Refill  . ALPRAZolam (XANAX) 0.25 MG tablet Take 1 tablet (0.25 mg total) by mouth 2 (two) times daily as needed for anxiety. 20 tablet 0  . amLODipine (NORVASC) 5 MG tablet Take 1 tablet (5 mg total) by mouth daily. 90 tablet 1  . cetirizine (ZYRTEC) 10 MG tablet Take 1 tablet (10 mg total) by mouth daily.    Marland Kitchen desvenlafaxine (PRISTIQ) 100 MG 24 hr tablet Take 1 tablet (100 mg total) by mouth daily. 90 tablet 1  . fenofibrate 160 MG tablet Take 1 tablet (160 mg total) by mouth daily. 90 tablet 1  . levonorgestrel-ethinyl estradiol (ORSYTHIA) 0.1-20 MG-MCG tablet Take 1 tablet by mouth daily.    Marland Kitchen levothyroxine (SYNTHROID) 137 MCG tablet Take 1 tablet (137 mcg total) by mouth daily before breakfast. 30 tablet 2  . rosuvastatin (CRESTOR) 10 MG tablet Take 1 tablet (10 mg total) by mouth daily. 90 tablet 1  . valACYclovir (VALTREX) 1000 MG tablet Take 1 tablet (1,000 mg total) by mouth 3 (three) times daily. 30 tablet 2   No current facility-administered medications on file prior to visit.     Objective:  Objective  Physical Exam Vitals and nursing note reviewed.  Constitutional:      Appearance: She is well-developed.  HENT:     Head: Normocephalic and atraumatic.  Eyes:     Conjunctiva/sclera: Conjunctivae normal.  Neck:     Thyroid: No thyromegaly.     Vascular: No carotid bruit or JVD.  Cardiovascular:     Rate and Rhythm: Normal rate and regular rhythm.     Heart sounds: Normal  heart sounds. No murmur.  Pulmonary:     Effort: Pulmonary effort is normal. No respiratory distress.     Breath sounds: Normal breath sounds. No wheezing or rales.  Chest:     Chest wall: No tenderness.  Musculoskeletal:     Cervical back: Normal range of motion and neck supple.  Neurological:     Mental Status: She is alert and oriented to person, place, and time.      BP 122/72 (BP Location: Right Arm, Patient Position: Sitting, Cuff Size: Normal)   Pulse 97   Temp (!) 96.6 F (35.9 C) (Temporal)   Ht 5\' 4"  (1.626 m)   Wt 180 lb 6 oz (81.8 kg)   SpO2 98%   BMI 30.96 kg/m  Wt Readings from Last 3 Encounters:  10/10/19 180 lb 6 oz (81.8 kg)  04/07/19 181 lb 6.4 oz (82.3 kg)  03/10/19 178 lb 6.4 oz (80.9 kg)     Lab Results  Component Value Date   WBC 6.2 04/07/2019   HGB 10.8 (L) 04/07/2019   HCT 33.7 (L) 04/07/2019   PLT 404.0 (H) 04/07/2019   GLUCOSE 95 04/05/2019   CHOL 148 04/05/2019   TRIG 180.0 (H) 04/05/2019   HDL 48.20 04/05/2019   LDLDIRECT 138.3 06/20/2008   LDLCALC 63 04/05/2019   ALT 14 04/05/2019   AST 17 04/05/2019   NA 137 04/05/2019   K 3.9 04/05/2019   CL 102 04/05/2019   CREATININE 0.76 04/05/2019   BUN 15 04/05/2019   CO2 26 04/05/2019   TSH 2.54 04/05/2019   HGBA1C 6.2 04/07/2019   MICROALBUR 1.0 03/18/2018    No results found.   Assessment & Plan:  Plan  I am having Ellouise Longton maintain her cetirizine, levonorgestrel-ethinyl estradiol, fenofibrate, desvenlafaxine, amLODipine, rosuvastatin, ALPRAZolam, levothyroxine, valACYclovir, and QUEtiapine.  Meds ordered this encounter  Medications  . QUEtiapine (SEROQUEL) 100 MG tablet    Sig: Take 1 tablet (100 mg total) by mouth at bedtime.    Dispense:  90 tablet    Refill:  1    Problem List Items Addressed This Visit      Unprioritized   Anxiety    Stable Refill xanax Database reviewed Contract and uds done      HTN (hypertension)    Well controlled, no changes to meds. Encouraged heart healthy diet such as the DASH diet and exercise as tolerated.       Hyperglycemia   Relevant Orders   Hemoglobin A1c   Hyperlipidemia   Relevant Orders   Lipid panel   Hyperlipidemia LDL goal <100    Lab Results  Component Value Date   CHOL 148 04/05/2019   HDL 48.20 04/05/2019   LDLCALC 63 04/05/2019   LDLDIRECT 138.3 06/20/2008   TRIG 180.0 (H)  04/05/2019   CHOLHDL 3 04/05/2019        Hypothyroidism   Relevant Orders   TSH   Insomnia   Relevant Medications   QUEtiapine (SEROQUEL) 100 MG tablet    Other Visit Diagnoses    Anemia, unspecified type    -  Primary   Relevant Orders   CBC with Differential/Platelet   IBC + Ferritin   Encounter for long-term (current) use of high-risk medication       Relevant Orders   Pain Mgmt, Profile 8 w/Conf, U      Follow-up: Return in about 6 months (around 04/08/2020), or if symptoms worsen or fail to improve.  [name], DO

## 2019-10-11 ENCOUNTER — Other Ambulatory Visit: Payer: Self-pay | Admitting: Family Medicine

## 2019-10-11 DIAGNOSIS — D509 Iron deficiency anemia, unspecified: Secondary | ICD-10-CM

## 2019-10-11 LAB — IBC + FERRITIN
Ferritin: 3.4 ng/mL — ABNORMAL LOW (ref 10.0–291.0)
Iron: 35 ug/dL — ABNORMAL LOW (ref 42–145)
Saturation Ratios: 4.8 % — ABNORMAL LOW (ref 20.0–50.0)
Transferrin: 525 mg/dL — ABNORMAL HIGH (ref 212.0–360.0)

## 2019-10-11 LAB — CBC WITH DIFFERENTIAL/PLATELET
Basophils Absolute: 0 10*3/uL (ref 0.0–0.1)
Basophils Relative: 0.9 % (ref 0.0–3.0)
Eosinophils Absolute: 0.1 10*3/uL (ref 0.0–0.7)
Eosinophils Relative: 2.8 % (ref 0.0–5.0)
HCT: 35.4 % — ABNORMAL LOW (ref 36.0–46.0)
Hemoglobin: 11.5 g/dL — ABNORMAL LOW (ref 12.0–15.0)
Lymphocytes Relative: 40.9 % (ref 12.0–46.0)
Lymphs Abs: 2.2 10*3/uL (ref 0.7–4.0)
MCHC: 32.4 g/dL (ref 30.0–36.0)
MCV: 75 fl — ABNORMAL LOW (ref 78.0–100.0)
Monocytes Absolute: 0.5 10*3/uL (ref 0.1–1.0)
Monocytes Relative: 8.6 % (ref 3.0–12.0)
Neutro Abs: 2.5 10*3/uL (ref 1.4–7.7)
Neutrophils Relative %: 46.8 % (ref 43.0–77.0)
Platelets: 394 10*3/uL (ref 150.0–400.0)
RBC: 4.72 Mil/uL (ref 3.87–5.11)
RDW: 16 % — ABNORMAL HIGH (ref 11.5–15.5)
WBC: 5.3 10*3/uL (ref 4.0–10.5)

## 2019-10-11 LAB — TSH: TSH: 1.38 u[IU]/mL (ref 0.35–4.50)

## 2019-10-11 LAB — LIPID PANEL
Cholesterol: 143 mg/dL (ref 0–200)
HDL: 44.5 mg/dL
LDL Cholesterol: 64 mg/dL (ref 0–99)
NonHDL: 98.48
Total CHOL/HDL Ratio: 3
Triglycerides: 171 mg/dL — ABNORMAL HIGH (ref 0.0–149.0)
VLDL: 34.2 mg/dL (ref 0.0–40.0)

## 2019-10-11 LAB — HEMOGLOBIN A1C: Hgb A1c MFr Bld: 6.5 % (ref 4.6–6.5)

## 2019-10-13 LAB — PAIN MGMT, PROFILE 8 W/CONF, U
6 Acetylmorphine: NEGATIVE ng/mL
Alcohol Metabolites: NEGATIVE ng/mL (ref <500)
Amphetamines: NEGATIVE ng/mL
Benzodiazepines: NEGATIVE ng/mL
Buprenorphine, Urine: NEGATIVE ng/mL
Cocaine Metabolite: NEGATIVE ng/mL
Creatinine: 78.5 mg/dL
MDA: NEGATIVE ng/mL
MDMA: NEGATIVE ng/mL
MDMA: NEGATIVE ng/mL
Marijuana Metabolite: NEGATIVE ng/mL
Opiates: NEGATIVE ng/mL
Oxidant: NEGATIVE ug/mL
Oxycodone: NEGATIVE ng/mL
pH: 6.7 (ref 4.5–9.0)

## 2019-10-26 ENCOUNTER — Encounter: Payer: Self-pay | Admitting: Family Medicine

## 2019-10-26 NOTE — Telephone Encounter (Signed)
As long as she is not having a problem we can put referral in for [facility] for colonoscopy

## 2019-10-27 ENCOUNTER — Encounter: Payer: Self-pay | Admitting: Family Medicine

## 2019-10-27 DIAGNOSIS — Z1231 Encounter for screening mammogram for malignant neoplasm of breast: Secondary | ICD-10-CM | POA: Diagnosis not present

## 2019-10-31 DIAGNOSIS — L728 Other follicular cysts of the skin and subcutaneous tissue: Secondary | ICD-10-CM | POA: Diagnosis not present

## 2019-10-31 DIAGNOSIS — L821 Other seborrheic keratosis: Secondary | ICD-10-CM | POA: Diagnosis not present

## 2019-11-07 ENCOUNTER — Encounter: Payer: Self-pay | Admitting: Family Medicine

## 2019-11-07 DIAGNOSIS — I10 Essential (primary) hypertension: Secondary | ICD-10-CM

## 2019-11-07 DIAGNOSIS — F419 Anxiety disorder, unspecified: Secondary | ICD-10-CM

## 2019-11-07 MED ORDER — FENOFIBRATE 160 MG PO TABS: 160.0000 mg | ORAL_TABLET | Freq: Every day | ORAL | 1 refills | Status: DC

## 2019-11-07 MED ORDER — AMLODIPINE BESYLATE 5 MG PO TABS: 5.0000 mg | ORAL_TABLET | Freq: Every day | ORAL | 1 refills | Status: DC

## 2019-11-07 MED ORDER — ALPRAZOLAM 0.25 MG PO TABS: 0.2500 mg | ORAL_TABLET | Freq: Two times a day (BID) | ORAL | 0 refills | Status: DC | PRN

## 2019-11-07 NOTE — Telephone Encounter (Signed)
Requesting: Xanax Contract: 10/10/19 UDS: 10/10/19 Last OV: 10/10/19 Next OV: 04/09/20 Last Refill: 07/12/19, #20--0 RF Database:   Please advise

## 2019-11-11 ENCOUNTER — Other Ambulatory Visit: Payer: Self-pay | Admitting: Family Medicine

## 2019-11-25 ENCOUNTER — Other Ambulatory Visit: Payer: Self-pay | Admitting: Family Medicine

## 2019-11-25 DIAGNOSIS — F32A Depression, unspecified: Secondary | ICD-10-CM

## 2019-11-25 DIAGNOSIS — F329 Major depressive disorder, single episode, unspecified: Secondary | ICD-10-CM

## 2019-11-29 ENCOUNTER — Encounter: Payer: Self-pay | Admitting: Family Medicine

## 2019-12-01 DIAGNOSIS — R928 Other abnormal and inconclusive findings on diagnostic imaging of breast: Secondary | ICD-10-CM | POA: Diagnosis not present

## 2019-12-01 DIAGNOSIS — N6489 Other specified disorders of breast: Secondary | ICD-10-CM | POA: Diagnosis not present

## 2019-12-01 LAB — HM MAMMOGRAPHY

## 2019-12-15 ENCOUNTER — Encounter: Payer: Self-pay | Admitting: Family Medicine

## 2020-01-07 ENCOUNTER — Encounter: Payer: Self-pay | Admitting: Family Medicine

## 2020-01-07 DIAGNOSIS — E785 Hyperlipidemia, unspecified: Secondary | ICD-10-CM

## 2020-01-09 MED ORDER — ROSUVASTATIN CALCIUM 10 MG PO TABS: 10.0000 mg | ORAL_TABLET | Freq: Every day | ORAL | 1 refills | Status: DC

## 2020-03-02 ENCOUNTER — Emergency Department (HOSPITAL_BASED_OUTPATIENT_CLINIC_OR_DEPARTMENT_OTHER): Payer: BC Managed Care – PPO

## 2020-03-02 ENCOUNTER — Encounter (HOSPITAL_BASED_OUTPATIENT_CLINIC_OR_DEPARTMENT_OTHER): Payer: Self-pay | Admitting: *Deleted

## 2020-03-02 ENCOUNTER — Other Ambulatory Visit: Payer: Self-pay

## 2020-03-02 ENCOUNTER — Emergency Department (HOSPITAL_BASED_OUTPATIENT_CLINIC_OR_DEPARTMENT_OTHER)
Admission: EM | Admit: 2020-03-02 | Discharge: 2020-03-02 | Disposition: A | Discharge status: Home / Self Care | Payer: BC Managed Care – PPO | Attending: Emergency Medicine | Admitting: Emergency Medicine

## 2020-03-02 DIAGNOSIS — Z87891 Personal history of nicotine dependence: Secondary | ICD-10-CM | POA: Diagnosis not present

## 2020-03-02 DIAGNOSIS — I10 Essential (primary) hypertension: Secondary | ICD-10-CM | POA: Diagnosis not present

## 2020-03-02 DIAGNOSIS — E1165 Type 2 diabetes mellitus with hyperglycemia: Secondary | ICD-10-CM | POA: Insufficient documentation

## 2020-03-02 DIAGNOSIS — M79672 Pain in left foot: Secondary | ICD-10-CM | POA: Diagnosis not present

## 2020-03-02 DIAGNOSIS — M79662 Pain in left lower leg: Secondary | ICD-10-CM | POA: Insufficient documentation

## 2020-03-02 DIAGNOSIS — M25672 Stiffness of left ankle, not elsewhere classified: Secondary | ICD-10-CM | POA: Diagnosis not present

## 2020-03-02 DIAGNOSIS — Z79899 Other long term (current) drug therapy: Secondary | ICD-10-CM | POA: Diagnosis not present

## 2020-03-02 DIAGNOSIS — S99912A Unspecified injury of left ankle, initial encounter: Secondary | ICD-10-CM | POA: Diagnosis not present

## 2020-03-02 NOTE — ED Provider Notes (Signed)
[facility] Provider Note   CSN: [id number] Arrival date & time: 03/02/20  1853     History Chief Complaint  Patient presents with  . Ankle Injury    Katie [name] is a 54 y.o. female with history of anemia, anxiety, hyperlipidemia, migraine headaches, restless leg syndrome, thyroid disease, type 2 diabetes mellitus presenting for evaluation of acute onset, persistent left lower leg pain beginning earlier today.  She reports that around 2 weeks ago she began developing distal lower leg pain posteriorly while at the beach.  She bought a splint that she has been wearing and taking over-the-counter medications with some improvement.  Today while walking her dog she reports that her dog suddenly pulled on the leash.  When she planted her left foot on the ground she heard a "pop" and felt immediate worsening of pain to the posterior left lower leg mostly around the calf.  She has noted some intermittent tingling sensation to the dorsum of the foot.  She has had difficulty bearing weight due to worsening pain.  Denies knee pain.  Has not tried anything for her symptoms yet today.  She denies recent surgeries or long distance travel (the beach was around 3 hours away), no history of DVT or PE.  She is on OCPs.  The history is provided by the patient.       Past Medical History:  Diagnosis Date  . Allergy   . Anemia   . Anxiety   . Blood transfusion without reported diagnosis   . Hyperlipidemia   . Migraine   . PVC (premature ventricular contraction)   . RLS (restless legs syndrome)   . Thyroid disease     Patient Active Problem List   Diagnosis Date Noted  . Allergy   . Close exposure to COVID-19 virus 02/10/2019  . HTN (hypertension) 02/10/2019  . Hashimoto's thyroiditis 10/26/2018  . Need for shingles vaccine 10/13/2018  . Hyperlipidemia LDL goal <100 10/01/2017  . Type 2 diabetes mellitus with hyperglycemia, without long-term current use of insulin  ([place]) 10/01/2017  . Preventative health care 03/21/2017  . Hyperglycemia 03/21/2017  . Menstrual migraine without status migrainosus, not intractable 04/17/2015  . Migraine 04/10/2015  . Viral syndrome 04/20/2014  . Obesity (BMI 30-39.9) 03/14/2013  . Sinusitis 08/24/2011  . RLS (restless legs syndrome) 12/13/2009  . OBSTRUCTIVE SLEEP APNEA 12/12/2009  . Anxiety 02/01/2009  . Hyperlipidemia 01/19/2009  . HYPOKALEMIA, MILD 01/19/2009  . DYSPEPSIA 09/28/2008  . Chest pain 07/19/2008  . Insomnia 07/03/2008  . PLANTAR FASCIITIS, BILATERAL 06/20/2008  . Hypothyroidism 02/22/2007  . DEVIATED NASAL SEPTUM 02/22/2007  . PREGNANCY, ECTOPIC NEC W/INTRAUTERINE PRG 02/22/2007  . HEADACHE 02/22/2007  . Other postprocedural status(V45.89) 02/22/2007    Past Surgical History:  Procedure Laterality Date  . BUNIONECTOMY  06/04/2009   Right foot  . CARPAL TUNNEL RELEASE     Bilateral  . CRYOABLATION  11/04  . ECTOPIC PREGNANCY SURGERY  1998  . ENDOMETRIAL ABLATION    . SEPTOPLASTY  6/05  . TUBAL LIGATION  04/1999     OB History    Gravida  4   Para  2   Term      Preterm      AB  1   Living  2     SAB      TAB      Ectopic  1   Multiple      Live Births  Family History  Problem Relation Age of Onset  . Depression Father        Suicide  . Aneurysm Mother        Brain  . Hypertension Sister   . Thyroid disease Sister   . Diabetes Maternal Grandmother   . Alzheimer's disease Maternal Grandmother   . Uterine cancer Maternal Grandmother   . Hypertension Maternal Grandmother   . Osteoporosis Maternal Grandmother   . Cancer Maternal Grandmother        uterine  . Lung cancer Maternal Grandfather   . Cancer Maternal Grandfather        lung  . Cancer Paternal Grandmother        metatstatic-- ? primary colon  . Cancer Paternal Grandfather        skin, hepatic    Social History   Tobacco Use  . Smoking status: Former Smoker    Packs/day: 1.00     Quit date: 01/14/1995    Years since quitting: 25.1  . Smokeless tobacco: Former Systems developer    Quit date: 08/26/1995  . Tobacco comment: started smoking at age 53 1ppd  Vaping Use  . Vaping Use: Never used  Substance Use Topics  . Alcohol use: Yes    Comment: Rare  . Drug use: No    Home Medications Prior to Admission medications   Medication Sig Start Date End Date Taking? Authorizing Provider  ALPRAZolam (XANAX) 0.25 MG tablet Take 1 tablet (0.25 mg total) by mouth 2 (two) times daily as needed for anxiety. 11/07/19   Roma Schanz R, DO  amLODipine (NORVASC) 5 MG tablet Take 1 tablet (5 mg total) by mouth daily. 11/07/19   [name], DO  cetirizine (ZYRTEC) 10 MG tablet Take 1 tablet (10 mg total) by mouth daily. 01/17/16   Roma Schanz R, DO  desvenlafaxine (PRISTIQ) 100 MG 24 hr tablet TAKE 1 TABLET BY MOUTH DAILY 11/28/19   Carollee Herter, Alferd Apa, DO  fenofibrate 160 MG tablet Take 1 tablet (160 mg total) by mouth daily. 11/07/19   [name], DO  levonorgestrel-ethinyl estradiol (ORSYTHIA) 0.1-20 MG-MCG tablet Take 1 tablet by mouth daily.    [provider]  levothyroxine (SYNTHROID) 137 MCG tablet TAKE 1 TABLET BY MOUTH DAILY WITH BREAKFAST 11/11/19   Carollee Herter, Alferd Apa, DO  QUEtiapine (SEROQUEL) 100 MG tablet Take 1 tablet (100 mg total) by mouth at bedtime. 10/10/19   Roma Schanz R, DO  rosuvastatin (CRESTOR) 10 MG tablet Take 1 tablet (10 mg total) by mouth daily. 01/09/20   [name], DO  valACYclovir (VALTREX) 1000 MG tablet Take 1 tablet (1,000 mg total) by mouth 3 (three) times daily. 08/22/19   [name], DO    Allergies    Morphine and Lisinopril  Review of Systems   Review of Systems  Constitutional: Negative for chills and fever.  Respiratory: Negative for shortness of breath.   Cardiovascular: Negative for chest pain.  Musculoskeletal: Positive for arthralgias and myalgias.  Neurological:  Positive for numbness. Negative for weakness.    Physical Exam Updated Vital Signs BP (!) 158/96 (BP Location: Left Arm)   Pulse 69   Temp 98.8 F (37.1 C) (Oral)   Resp 18   Ht 5\' 4"  (1.626 m)   Wt 78.5 kg   SpO2 98%   BMI 29.70 kg/m   Physical Exam Vitals and nursing note reviewed.  Constitutional:  General: She is not in acute distress.    Appearance: She is well-developed.  HENT:     Head: Normocephalic and atraumatic.  Eyes:     General:        Right eye: No discharge.        Left eye: No discharge.     Conjunctiva/sclera: Conjunctivae normal.  Neck:     Vascular: No JVD.     Trachea: No tracheal deviation.  Cardiovascular:     Rate and Rhythm: Normal rate.     Pulses: Normal pulses.     Comments: 2+ DP/PT pulses bilaterally.  No lower extremity edema.  Bevelyn Buckles' sign present on the left.  No palpable cords.  Compartments are soft. Pulmonary:     Effort: Pulmonary effort is normal.  Abdominal:     General: There is no distension.  Musculoskeletal:        General: Tenderness present. No swelling.     Cervical back: Neck supple.     Comments: No swelling or tenderness to palpation around the left knee or ankle.  No ecchymosis.  No Achilles tendon deformity noted.  Limited range of motion of the left ankle but she is able to plantarflex and dorsiflex.  Thompson's test is negative.  Compartments are soft.  Patient has focal left calf tenderness.  No erythema or palpable cords.  No tenderness to palpation of the left knee, no varus or valgus instability.  No tenderness to palpation or swelling noted to the popliteal fossa.  Skin:    General: Skin is warm and dry.     Findings: No erythema.  Neurological:     Mental Status: She is alert.     Comments: Sensation intact to light touch of bilateral lower extremities.  Ambulatory with antalgic gait.  Psychiatric:        Behavior: Behavior normal.     ED Results / Procedures / Treatments   Labs (all labs ordered  are listed, but only abnormal results are displayed) Labs Reviewed - No data to display  EKG None  Radiology DG Ankle Complete Left  Result Date: 03/02/2020 CLINICAL DATA:  Status post trauma. EXAM: LEFT ANKLE COMPLETE - 3+ VIEW COMPARISON:  None. FINDINGS: There is no evidence of fracture, dislocation, or joint effusion. There is no evidence of arthropathy or other focal bone abnormality. Soft tissues are unremarkable. IMPRESSION: Negative. Electronically Signed   By: Virgina Norfolk M.D.   On: 03/02/2020 19:34   US Venous Img Lower Unilateral Left  Result Date: 03/03/2020 CLINICAL DATA:  Leg pain. EXAM: LEFT LOWER EXTREMITY VENOUS DOPPLER ULTRASOUND TECHNIQUE: Gray-scale sonography with compression, as well as color and duplex ultrasound, were performed to evaluate the deep venous system(s) from the level of the common femoral vein through the popliteal and proximal calf veins. COMPARISON:  None. FINDINGS: VENOUS Normal compressibility of the common femoral, superficial femoral, and popliteal veins, as well as the visualized calf veins. Visualized portions of profunda femoral vein and great saphenous vein unremarkable. No filling defects to suggest DVT on grayscale or color Doppler imaging. Doppler waveforms show normal direction of venous flow, normal respiratory plasticity and response to augmentation. Limited views of the contralateral common femoral vein are unremarkable. OTHER Hypoechoic collection within the soft tissues of the LEFT calf, measuring 3.1 cm in length and 6 mm thickness, compatible with hematoma versus ruptured Baker cyst. Limitations: none IMPRESSION: 1. No DVT. 2. Hypoechoic fluid-like collection within the soft tissues of the LEFT calf, measuring 3.1 cm in  length and 6 mm thickness, compatible with hematoma versus ruptured Baker cyst. Electronically Signed   By: Franki Cabot M.D.   On: 03/03/2020 09:22    Procedures Procedures (including critical care time)  Medications  Ordered in ED Medications - No data to display  ED Course  I have reviewed the triage vital signs and the nursing notes.  Pertinent labs & imaging results that were available during my care of the patient were reviewed by me and considered in my medical decision making (see chart for details).    MDM Rules/Calculators/A&P                          Patient presenting for evaluation of sudden worsening of left calf pain.  She developed more mild symptoms that started 2 weeks ago that have been improving but sudden worsening today when she planted her left lower extremity to the ground as her dog pulled her.  She is afebrile, vital signs stable.  She is nontoxic in appearance.  Compartments are soft.  No lower extremity edema noted.  Sensation intact.  Physical examination does not suggest Achilles tendon rupture as she has no tenderness to palpation around the Achilles tendon, no palpable gap along the insertion point of the Achilles tendon and Thompson's test is negative.  Radiographs of the ankle are negative.  No tenderness to palpation involving the knee, popliteal fossa, or ankle.  No ligamentous laxity noted.  She is able to ambulate despite pain, has a fair amount of pain with weightbearing.  Physical examination does not suggest compartment syndrome.  Considered DVT though this seems unlikely given her worsening symptoms are in the setting of acute trauma earlier today.  We will arrange for DVT study to be performed first thing in the morning and she will return for this.  In the meantime we will place her in a cam walker and give crutches.  Discussed conservative therapy and management with NSAIDs, Tylenol, elevation, gentle stretching, heat therapy.  Recommend follow-up with PCP, orthopedics, or sports medicine for outpatient follow-up.  Discussed strict ED return precautions.  Patient verbalized understanding of and agreement with plan and patient is stable for discharge at this time.  Discussed  with Dr. Laverta Baltimore who agrees with assessment and plan at this time.   Final Clinical Impression(s) / ED Diagnoses Final diagnoses:  Pain in left lower leg    Rx / DC Orders ED Discharge Orders         Ordered    US Venous Img Lower Unilateral Left     Discontinue     03/02/20 2246           Renita Papa, PA-C 03/03/20 1104    Margette Fast, MD 03/10/20 2054

## 2020-03-02 NOTE — ED Triage Notes (Signed)
Pain behind her left foot after her dog pulled her tonight and she heard a pop.

## 2020-03-02 NOTE — Discharge Instructions (Signed)
1. Medications: Alternate 600 mg of ibuprofen and [phone] mg of Tylenol every 3 hours as needed for pain. Do not exceed 4000 mg of Tylenol daily.  Take ibuprofen with food to avoid upset stomach issues.  2. Treatment: rest, apply ice or heat whichever feels best 20 minutes at a time a few times daily, elevate and use CAM Walker, use the crutches to help you get around.  Drink plenty of fluids, gentle stretching 3. Follow Up: Follow the instructions for follow-up tomorrow for ultrasound to rule out blood clot.  Please followup with orthopedics as directed or your PCP  for discussion of your diagnoses and further evaluation after today's visit; if you do not have a primary care doctor use the resource guide provided to find one; Please return to the ER for worsening symptoms or other concerns such as worsening swelling, redness of the skin, fevers, loss of pulses, or loss of feeling

## 2020-03-03 ENCOUNTER — Ambulatory Visit (HOSPITAL_BASED_OUTPATIENT_CLINIC_OR_DEPARTMENT_OTHER)
Admission: RE | Admit: 2020-03-03 | Discharge: 2020-03-03 | Disposition: A | Discharge status: Home / Self Care | Payer: BC Managed Care – PPO | Source: Ambulatory Visit | Attending: Emergency Medicine | Admitting: Emergency Medicine

## 2020-03-03 DIAGNOSIS — M79605 Pain in left leg: Secondary | ICD-10-CM | POA: Insufficient documentation

## 2020-03-06 ENCOUNTER — Telehealth (INDEPENDENT_AMBULATORY_CARE_PROVIDER_SITE_OTHER): Payer: BC Managed Care – PPO | Admitting: Family Medicine

## 2020-03-06 ENCOUNTER — Encounter: Payer: Self-pay | Admitting: Family Medicine

## 2020-03-06 ENCOUNTER — Telehealth: Payer: Self-pay | Admitting: Family Medicine

## 2020-03-06 ENCOUNTER — Other Ambulatory Visit: Payer: Self-pay

## 2020-03-06 DIAGNOSIS — G4700 Insomnia, unspecified: Secondary | ICD-10-CM

## 2020-03-06 MED ORDER — QUETIAPINE FUMARATE ER 200 MG PO TB24: 200.0000 mg | ORAL_TABLET | Freq: Every day | ORAL | 1 refills | Status: DC

## 2020-03-06 NOTE — Telephone Encounter (Signed)
Called pt to set up Appt (referral from ED)  --glh

## 2020-03-06 NOTE — Progress Notes (Signed)
Virtual Visit via Video Note  I connected with Katie [name] on 03/06/20 at  4:40 PM EDT by a video enabled telemedicine application and verified that I am speaking with the correct person using two identifiers.  Location: Patient: in car alone  Provider: office    I discussed the limitations of evaluation and management by telemedicine and the availability of in person appointments. The patient expressed understanding and agreed to proceed.  History of Present Illness: Pt is in the car c/o not sleeping -- she is requesting to try long acting seroquel  No other complaints    Observations/Objective: There were no vitals filed for this visit. Pt is in her car in NAD No sob  Assessment and Plan: 1. Insomnia, unspecified type D/c seroquel 100 mg  Start seroquel xr 200 mg and f/u 2-3 months or sooner prn  - QUEtiapine (SEROQUEL XR) 200 MG 24 hr tablet; Take 1 tablet (200 mg total) by mouth at bedtime.  Dispense: 30 tablet; Refill: 130   Follow Up Instructions:    I discussed the assessment and treatment plan with the patient. The patient was provided an opportunity to ask questions and all were answered. The patient agreed with the plan and demonstrated an understanding of the instructions.   The patient was advised to call back or seek an in-person evaluation if the symptoms worsen or if the condition fails to improve as anticipated.  I provided 30 minutes of non-face-to-face time during this encounter.   [name], DO

## 2020-03-07 ENCOUNTER — Encounter: Payer: Self-pay | Admitting: Family Medicine

## 2020-03-07 NOTE — Telephone Encounter (Signed)
If her mag level is not low , not  sure it will do any good Calcium / mag combo supp may be helpful  We can check mag next ov

## 2020-03-07 NOTE — Telephone Encounter (Signed)
Please advise 

## 2020-03-20 ENCOUNTER — Encounter: Payer: Self-pay | Admitting: Family Medicine

## 2020-03-20 MED ORDER — LEVOTHYROXINE SODIUM 137 MCG PO TABS: 137.0000 ug | ORAL_TABLET | Freq: Every day | ORAL | 3 refills | Status: DC

## 2020-03-23 ENCOUNTER — Other Ambulatory Visit: Payer: Self-pay

## 2020-03-26 ENCOUNTER — Telehealth: Payer: Self-pay | Admitting: *Deleted

## 2020-03-26 MED ORDER — LEVONORGESTREL-ETHINYL ESTRAD 0.1-20 MG-MCG PO TABS: 1.0000 | ORAL_TABLET | Freq: Every day | ORAL | 0 refills | Status: DC

## 2020-03-26 NOTE — Telephone Encounter (Signed)
Annual exam scheduled on 04/16/20 needs refills on birth control pills, Rx sent.

## 2020-04-09 ENCOUNTER — Other Ambulatory Visit: Payer: Self-pay

## 2020-04-09 ENCOUNTER — Encounter: Payer: Self-pay | Admitting: Family Medicine

## 2020-04-09 ENCOUNTER — Ambulatory Visit (INDEPENDENT_AMBULATORY_CARE_PROVIDER_SITE_OTHER): Payer: BC Managed Care – PPO | Admitting: Family Medicine

## 2020-04-09 VITALS — BP 135/78 | HR 95 | Temp 99.0°F | Resp 13 | Ht 64.0 in | Wt 180.0 lb

## 2020-04-09 DIAGNOSIS — G4700 Insomnia, unspecified: Secondary | ICD-10-CM | POA: Diagnosis not present

## 2020-04-09 DIAGNOSIS — E039 Hypothyroidism, unspecified: Secondary | ICD-10-CM

## 2020-04-09 DIAGNOSIS — E1165 Type 2 diabetes mellitus with hyperglycemia: Secondary | ICD-10-CM | POA: Diagnosis not present

## 2020-04-09 DIAGNOSIS — F32A Depression, unspecified: Secondary | ICD-10-CM

## 2020-04-09 DIAGNOSIS — F329 Major depressive disorder, single episode, unspecified: Secondary | ICD-10-CM | POA: Diagnosis not present

## 2020-04-09 DIAGNOSIS — I10 Essential (primary) hypertension: Secondary | ICD-10-CM | POA: Diagnosis not present

## 2020-04-09 DIAGNOSIS — Z0000 Encounter for general adult medical examination without abnormal findings: Secondary | ICD-10-CM

## 2020-04-09 DIAGNOSIS — E785 Hyperlipidemia, unspecified: Secondary | ICD-10-CM

## 2020-04-09 MED ORDER — FENOFIBRATE 160 MG PO TABS: 160.0000 mg | ORAL_TABLET | Freq: Every day | ORAL | 1 refills | Status: DC

## 2020-04-09 MED ORDER — ROSUVASTATIN CALCIUM 10 MG PO TABS: 10.0000 mg | ORAL_TABLET | Freq: Every day | ORAL | 1 refills | Status: DC

## 2020-04-09 MED ORDER — QUETIAPINE FUMARATE ER 200 MG PO TB24: 200.0000 mg | ORAL_TABLET | Freq: Every day | ORAL | 1 refills | Status: DC

## 2020-04-09 MED ORDER — AMLODIPINE BESYLATE 5 MG PO TABS: 5.0000 mg | ORAL_TABLET | Freq: Every day | ORAL | 1 refills | Status: DC

## 2020-04-09 MED ORDER — DESVENLAFAXINE SUCCINATE ER 100 MG PO TB24: 100.0000 mg | ORAL_TABLET | Freq: Every day | ORAL | 3 refills | Status: DC

## 2020-04-09 MED ORDER — LEVOTHYROXINE SODIUM 137 MCG PO TABS: 137.0000 ug | ORAL_TABLET | Freq: Every day | ORAL | 3 refills | Status: DC

## 2020-04-09 NOTE — Progress Notes (Signed)
Subjective:     Katie [name] is a 53 y.o. female and is here for a comprehensive physical exam. The patient reports no problems. Pt also needs /fu bp, chol and dm.   No complaints  HPI HYPERTENSION   Blood pressure range-not checking   Chest pain- no      Dyspnea- no Lightheadedness- no   Edema- no  Other side effects - no   Medication compliance: good Low salt diet- yes    DIABETES    Blood Sugar ranges-not checking   Polyuria- no New Visual problems- no  Hypoglycemic symptoms- no  Other side effects-no Medication compliance - good Last eye exam- done    HYPERLIPIDEMIA  Medication compliance- good RUQ pain- no  Muscle aches- no Other side effects-no     Social History   Socioeconomic History  . Marital status: Married    Spouse name: Not on file  . Number of children: 2  . Years of education: 13  . Highest education level: Not on file  Occupational History  . Occupation: scheduler [employer]  Tobacco Use  . Smoking status: Former Smoker    Packs/day: 1.00    Quit date: 01/14/1995    Years since quitting: 25.2  . Smokeless tobacco: Former Systems developer    Quit date: 08/26/1995  . Tobacco comment: started smoking at age 11 1ppd  Vaping Use  . Vaping Use: Never used  Substance and Sexual Activity  . Alcohol use: Yes    Comment: Rare  . Drug use: No  . Sexual activity: Not Currently    Partners: Male    Comment: 1st intercourse 54 yo-More than 5 partners-BTL  Other Topics Concern  . Not on file  Social History Narrative   Exercise-- no   Social Determinants of Health   Financial Resource Strain:   . Difficulty of Paying Living Expenses:   Food Insecurity:   . Worried About Charity fundraiser in the Last Year:   . Arboriculturist in the Last Year:   Transportation Needs:   . Film/video editor (Medical):   Marland Kitchen Lack of Transportation (Non-Medical):   Physical Activity:   . Days of Exercise per Week:   . Minutes of Exercise per Session:   Stress:    . Feeling of Stress :   Social Connections:   . Frequency of Communication with Friends and Family:   . Frequency of Social Gatherings with Friends and Family:   . Attends Religious Services:   . Active Member of Clubs or Organizations:   . Attends Archivist Meetings:   Marland Kitchen Marital Status:   Intimate Partner Violence:   . Fear of Current or Ex-Partner:   . Emotionally Abused:   Marland Kitchen Physically Abused:   . Sexually Abused:    Health Maintenance  Topic Date Due  . OPHTHALMOLOGY EXAM  Never done  . COVID-19 Vaccine (1) Never done  . COLONOSCOPY  Never done  . FOOT EXAM  03/19/2019  . URINE MICROALBUMIN  03/19/2019  . INFLUENZA VACCINE  03/25/2020  . HEMOGLOBIN A1C  04/08/2020  . HIV Screening  03/18/2024 (Originally 04/25/1983)  . MAMMOGRAM  11/30/2020  . PAP SMEAR-Modifier  01/26/2021  . TETANUS/TDAP  01/27/2028  . PNEUMOCOCCAL POLYSACCHARIDE VACCINE AGE 65-64 HIGH RISK  Completed  . Hepatitis C Screening  Completed    The following portions of the patient's history were reviewed and updated as appropriate:  She  has a past medical history of Allergy, Anemia,  Anxiety, Blood transfusion without reported diagnosis, Hyperlipidemia, Migraine, PVC (premature ventricular contraction), RLS (restless legs syndrome), and Thyroid disease. She does not have any pertinent problems on file. She  has a past surgical history that includes Tubal ligation (04/1999); Carpal tunnel release; Septoplasty (6/05); Cryoablation (11/04); Ectopic pregnancy surgery (1998); Bunionectomy (06/04/2009); and Endometrial ablation. Her family history includes Alzheimer's disease in her maternal grandmother; Aneurysm in her mother; Cancer in her maternal grandfather, maternal grandmother, paternal grandfather, and paternal grandmother; Depression in her father; Diabetes in her maternal grandmother; Hypertension in her maternal grandmother and sister; Lung cancer in her maternal grandfather; Osteoporosis in her  maternal grandmother; Thyroid disease in her sister; Uterine cancer in her maternal grandmother. She  reports that she quit smoking about 25 years ago. She smoked 1.00 pack per day. She quit smokeless tobacco use about 24 years ago. She reports current alcohol use. She reports that she does not use drugs. She has a current medication list which includes the following prescription(s): alprazolam, cetirizine, levonorgestrel-ethinyl estradiol, valacyclovir, amlodipine, desvenlafaxine, fenofibrate, levothyroxine, quetiapine, and rosuvastatin. Current Outpatient Medications on File Prior to Visit  Medication Sig Dispense Refill  . ALPRAZolam (XANAX) 0.25 MG tablet Take 1 tablet (0.25 mg total) by mouth 2 (two) times daily as needed for anxiety. 20 tablet 0  . cetirizine (ZYRTEC) 10 MG tablet Take 1 tablet (10 mg total) by mouth daily.    Marland Kitchen levonorgestrel-ethinyl estradiol (ORSYTHIA) 0.1-20 MG-MCG tablet Take 1 tablet by mouth daily. 84 tablet 0  . valACYclovir (VALTREX) 1000 MG tablet Take 1 tablet (1,000 mg total) by mouth 3 (three) times daily. 30 tablet 2   No current facility-administered medications on file prior to visit.   She is allergic to morphine and lisinopril..  Review of Systems Review of Systems  Constitutional: Negative for activity change, appetite change and fatigue.  HENT: Negative for hearing loss, congestion, tinnitus and ear discharge.  dentist q70m Eyes: Negative for visual disturbance (see optho q1y -- vision corrected to 20/20 with glasses).  Respiratory: Negative for cough, chest tightness and shortness of breath.   Cardiovascular: Negative for chest pain, palpitations and leg swelling.  Gastrointestinal: Negative for abdominal pain, diarrhea, constipation and abdominal distention.  Genitourinary: Negative for urgency, frequency, decreased urine volume and difficulty urinating.  Musculoskeletal: Negative for back pain, arthralgias and gait problem.  Skin: Negative for  color change, pallor and rash.  Neurological: Negative for dizziness, light-headedness, numbness and headaches.  Hematological: Negative for adenopathy. Does not bruise/bleed easily.  Psychiatric/Behavioral: Negative for suicidal ideas, confusion, sleep disturbance, self-injury, dysphoric mood, decreased concentration and agitation.      Objective:    BP 135/78 (BP Location: Left Arm, Patient Position: Sitting, Cuff Size: Normal)   Pulse 95   Temp 99 F (37.2 C) (Oral)   Resp 13   Ht 5\' 4"  (1.626 m)   Wt 180 lb (81.6 kg)   LMP 03/29/2020   SpO2 98%   BMI 30.90 kg/m  General appearance: alert, cooperative, appears stated age and no distress Head: Normocephalic, without obvious abnormality, atraumatic Eyes: conjunctivae/corneas clear. PERRL, EOM's intact. Fundi benign. Ears: normal TM's and external ear canals both ears Neck: no adenopathy, no carotid bruit, no JVD, supple, symmetrical, trachea midline and thyroid not enlarged, symmetric, no tenderness/mass/nodules Back: symmetric, no curvature. ROM normal. No CVA tenderness. Lungs: clear to auscultation bilaterally Breasts: gyn Heart: regular rate and rhythm, S1, S2 normal, no murmur, click, rub or gallop Abdomen: soft, non-tender; bowel sounds normal; no masses,  no organomegaly Pelvic: deferred --gyn Extremities: extremities normal, atraumatic, no cyanosis or edema Pulses: 2+ and symmetric Skin: Skin color, texture, turgor normal. No rashes or lesions Lymph nodes: Cervical, supraclavicular, and axillary nodes normal. Neurologic: Alert and oriented X 3, normal strength and tone. Normal symmetric reflexes. Normal coordination and gait    Assessment:    Healthy female exam.      Plan:    ghm utd Check labs  See After Visit Summary for Counseling Recommendations    1. Preventative health care See above  - Lipid panel - CBC with Differential/Platelet - TSH - Comprehensive metabolic panel  2. Uncontrolled type 2  diabetes mellitus with hyperglycemia ([place]) hgba1c to be checked , minimize simple carbs. Increase exercise as tolerated. Continue current meds - Hemoglobin A1c  3. Essential hypertension Well controlled, no changes to meds. Encouraged heart healthy diet such as the DASH diet and exercise as tolerated.   - amLODipine (NORVASC) 5 MG tablet; Take 1 tablet (5 mg total) by mouth daily.  Dispense: 90 tablet; Refill: 1  4. Depression, unspecified depression type Stable con't meds  - desvenlafaxine (PRISTIQ) 100 MG 24 hr tablet; Take 1 tablet (100 mg total) by mouth daily.  Dispense: 90 tablet; Refill: 3  5. Insomnia, unspecified type Stable  con't meds  - QUEtiapine (SEROQUEL XR) 200 MG 24 hr tablet; Take 1 tablet (200 mg total) by mouth at bedtime.  Dispense: 90 tablet; Refill: 1  6. Hyperlipidemia LDL goal <100 Tolerating statin, encouraged heart healthy diet, avoid trans fats, minimize simple carbs and saturated fats. Increase exercise as tolerated - fenofibrate 160 MG tablet; Take 1 tablet (160 mg total) by mouth daily.  Dispense: 90 tablet; Refill: 1 - rosuvastatin (CRESTOR) 10 MG tablet; Take 1 tablet (10 mg total) by mouth daily.  Dispense: 90 tablet; Refill: 1  7. Hypothyroidism, unspecified type Stable Check labs  - levothyroxine (SYNTHROID) 137 MCG tablet; Take 1 tablet (137 mcg total) by mouth daily with breakfast.  Dispense: 90 tablet; Refill: 3

## 2020-04-09 NOTE — Patient Instructions (Signed)
Preventive Care 67-52 Years Old, Female Preventive care refers to visits with your health care provider and lifestyle choices that can promote health and wellness. This includes:  A yearly physical exam. This may also be called an annual well check.  Regular dental visits and eye exams.  Immunizations.  Screening for certain conditions.  Healthy lifestyle choices, such as eating a healthy diet, getting regular exercise, not using drugs or products that contain nicotine and tobacco, and limiting alcohol use. What can I expect for my preventive care visit? Physical exam Your health care provider will check your:  Height and weight. This may be used to calculate body mass index (BMI), which tells if you are at a healthy weight.  Heart rate and blood pressure.  Skin for abnormal spots. Counseling Your health care provider may ask you questions about your:  Alcohol, tobacco, and drug use.  Emotional well-being.  Home and relationship well-being.  Sexual activity.  Eating habits.  Work and work Statistician.  Method of birth control.  Menstrual cycle.  Pregnancy history. What immunizations do I need?  Influenza (flu) vaccine  This is recommended every year. Tetanus, diphtheria, and pertussis (Tdap) vaccine  You may need a Td booster every 10 years. Varicella (chickenpox) vaccine  You may need this if you have not been vaccinated. Zoster (shingles) vaccine  You may need this after age 52. Measles, mumps, and rubella (MMR) vaccine  You may need at least one dose of MMR if you were born in 1957 or later. You may also need a second dose. Pneumococcal conjugate (PCV13) vaccine  You may need this if you have certain conditions and were not previously vaccinated. Pneumococcal polysaccharide (PPSV23) vaccine  You may need one or two doses if you smoke cigarettes or if you have certain conditions. Meningococcal conjugate (MenACWY) vaccine  You may need this if you  have certain conditions. Hepatitis A vaccine  You may need this if you have certain conditions or if you travel or work in places where you may be exposed to hepatitis A. Hepatitis B vaccine  You may need this if you have certain conditions or if you travel or work in places where you may be exposed to hepatitis B. Haemophilus influenzae type b (Hib) vaccine  You may need this if you have certain conditions. Human papillomavirus (HPV) vaccine  If recommended by your health care provider, you may need three doses over 6 months. You may receive vaccines as individual doses or as more than one vaccine together in one shot (combination vaccines). Talk with your health care provider about the risks and benefits of combination vaccines. What tests do I need? Blood tests  Lipid and cholesterol levels. These may be checked every 5 years, or more frequently if you are over 107 years old.  Hepatitis C test.  Hepatitis B test. Screening  Lung cancer screening. You may have this screening every year starting at age 52 if you have a 30-pack-year history of smoking and currently smoke or have quit within the past 15 years.  Colorectal cancer screening. All adults should have this screening starting at age 52 and continuing until age 19. Your health care provider may recommend screening at age 7 if you are at increased risk. You will have tests every 1-10 years, depending on your results and the type of screening test.  Diabetes screening. This is done by checking your blood sugar (glucose) after you have not eaten for a while (fasting). You may have this  done every 1-3 years.  Mammogram. This may be done every 1-2 years. Talk with your health care provider about when you should start having regular mammograms. This may depend on whether you have a family history of breast cancer.  BRCA-related cancer screening. This may be done if you have a family history of breast, ovarian, tubal, or peritoneal  cancers.  Pelvic exam and Pap test. This may be done every 3 years starting at age 52. Starting at age 52, this may be done every 5 years if you have a Pap test in combination with an HPV test. Other tests  Sexually transmitted disease (STD) testing.  Bone density scan. This is done to screen for osteoporosis. You may have this scan if you are at high risk for osteoporosis. Follow these instructions at home: Eating and drinking  Eat a diet that includes fresh fruits and vegetables, whole grains, lean protein, and low-fat dairy.  Take vitamin and mineral supplements as recommended by your health care provider.  Do not drink alcohol if: ? Your health care provider tells you not to drink. ? You are pregnant, may be pregnant, or are planning to become pregnant.  If you drink alcohol: ? Limit how much you have to 0-1 drink a day. ? Be aware of how much alcohol is in your drink. In the [place] one drink equals one 12 oz bottle of beer (355 mL), one 5 oz glass of wine (148 mL), or one 1 oz glass of hard liquor (44 mL). Lifestyle  Take daily care of your teeth and gums.  Stay active. Exercise for at least 30 minutes on 5 or more days each week.  Do not use any products that contain nicotine or tobacco, such as cigarettes, e-cigarettes, and chewing tobacco. If you need help quitting, ask your health care provider.  If you are sexually active, practice safe sex. Use a condom or other form of birth control (contraception) in order to prevent pregnancy and STIs (sexually transmitted infections).  If told by your health care provider, take low-dose aspirin daily starting at age 52. What's next?  Visit your health care provider once a year for a well check visit.  Ask your health care provider how often you should have your eyes and teeth checked.  Stay up to date on all vaccines. This information is not intended to replace advice given to you by your health care provider. Make sure you  discuss any questions you have with your health care provider. Document Revised: 04/22/2018 Document Reviewed: 04/22/2018 Elsevier Patient Education  2020 [employer].

## 2020-04-10 LAB — CBC WITH DIFFERENTIAL/PLATELET
Basophils Absolute: 0 10*3/uL (ref 0.0–0.1)
Basophils Relative: 0.8 % (ref 0.0–3.0)
Eosinophils Absolute: 0.1 10*3/uL (ref 0.0–0.7)
Eosinophils Relative: 2.8 % (ref 0.0–5.0)
HCT: 36.4 % (ref 36.0–46.0)
Hemoglobin: 11.9 g/dL — ABNORMAL LOW (ref 12.0–15.0)
Lymphocytes Relative: 42.8 % (ref 12.0–46.0)
Lymphs Abs: 1.8 10*3/uL (ref 0.7–4.0)
MCHC: 32.7 g/dL (ref 30.0–36.0)
MCV: 80.4 fl (ref 78.0–100.0)
Monocytes Absolute: 0.3 10*3/uL (ref 0.1–1.0)
Monocytes Relative: 7.6 % (ref 3.0–12.0)
Neutro Abs: 2 10*3/uL (ref 1.4–7.7)
Neutrophils Relative %: 46 % (ref 43.0–77.0)
Platelets: 342 10*3/uL (ref 150.0–400.0)
RBC: 4.53 Mil/uL (ref 3.87–5.11)
RDW: 16.7 % — ABNORMAL HIGH (ref 11.5–15.5)
WBC: 4.2 10*3/uL (ref 4.0–10.5)

## 2020-04-10 LAB — COMPREHENSIVE METABOLIC PANEL
ALT: 12 U/L (ref 0–35)
AST: 14 U/L (ref 0–37)
Albumin: 4.3 g/dL (ref 3.5–5.2)
Alkaline Phosphatase: 51 U/L (ref 39–117)
BUN: 12 mg/dL (ref 6–23)
CO2: 27 mEq/L (ref 19–32)
Calcium: 9.5 mg/dL (ref 8.4–10.5)
Chloride: 102 mEq/L (ref 96–112)
Creatinine, Ser: 0.86 mg/dL (ref 0.40–1.20)
GFR: 69.3 mL/min (ref >60.00)
Glucose, Bld: 119 mg/dL — ABNORMAL HIGH (ref 70–99)
Potassium: 4.9 mEq/L (ref 3.5–5.1)
Sodium: 137 mEq/L (ref 135–145)
Total Bilirubin: 0.3 mg/dL (ref 0.2–1.2)
Total Protein: 7 g/dL (ref 6.0–8.3)

## 2020-04-10 LAB — TSH: TSH: 1.05 u[IU]/mL (ref 0.35–4.50)

## 2020-04-10 LAB — LIPID PANEL
Cholesterol: 132 mg/dL (ref 0–200)
HDL: 43.1 mg/dL (ref >39.00)
NonHDL: 89.33
Total CHOL/HDL Ratio: 3
Triglycerides: 235 mg/dL — ABNORMAL HIGH (ref 0.0–149.0)
VLDL: 47 mg/dL — ABNORMAL HIGH (ref 0.0–40.0)

## 2020-04-10 LAB — HEMOGLOBIN A1C: Hgb A1c MFr Bld: 5.9 % (ref 4.6–6.5)

## 2020-04-10 LAB — LDL CHOLESTEROL, DIRECT: Direct LDL: 62 mg/dL

## 2020-04-16 ENCOUNTER — Encounter: Payer: BC Managed Care – PPO | Admitting: Nurse Practitioner

## 2020-04-19 DIAGNOSIS — Z20822 Contact with and (suspected) exposure to covid-19: Secondary | ICD-10-CM | POA: Diagnosis not present

## 2020-05-01 ENCOUNTER — Encounter: Payer: Self-pay | Admitting: Family Medicine

## 2020-05-01 ENCOUNTER — Other Ambulatory Visit: Payer: Self-pay | Admitting: Family Medicine

## 2020-05-01 DIAGNOSIS — F419 Anxiety disorder, unspecified: Secondary | ICD-10-CM

## 2020-05-02 MED ORDER — ALPRAZOLAM 0.25 MG PO TABS: 0.2500 mg | ORAL_TABLET | Freq: Two times a day (BID) | ORAL | 0 refills | Status: DC | PRN

## 2020-05-02 NOTE — Telephone Encounter (Signed)
Last written: 11/07/19 Last ov: 03/06/20 Next ov: none Contract: 10/10/19 UDS: 10/10/19

## 2020-05-02 NOTE — Telephone Encounter (Signed)
PDMP okay, Rx sent 

## 2020-05-16 ENCOUNTER — Other Ambulatory Visit: Payer: Self-pay

## 2020-05-16 ENCOUNTER — Ambulatory Visit (INDEPENDENT_AMBULATORY_CARE_PROVIDER_SITE_OTHER): Payer: BC Managed Care – PPO | Admitting: Nurse Practitioner

## 2020-05-16 ENCOUNTER — Encounter: Payer: Self-pay | Admitting: Nurse Practitioner

## 2020-05-16 VITALS — BP 122/80 | Ht 64.0 in | Wt 183.0 lb

## 2020-05-16 DIAGNOSIS — Z01419 Encounter for gynecological examination (general) (routine) without abnormal findings: Secondary | ICD-10-CM | POA: Diagnosis not present

## 2020-05-16 DIAGNOSIS — N920 Excessive and frequent menstruation with regular cycle: Secondary | ICD-10-CM | POA: Diagnosis not present

## 2020-05-16 DIAGNOSIS — Z3041 Encounter for surveillance of contraceptive pills: Secondary | ICD-10-CM

## 2020-05-16 DIAGNOSIS — Z9889 Other specified postprocedural states: Secondary | ICD-10-CM

## 2020-05-16 MED ORDER — LEVONORGESTREL-ETHINYL ESTRAD 0.1-20 MG-MCG PO TABS: 1.0000 | ORAL_TABLET | Freq: Every day | ORAL | 3 refills | Status: DC

## 2020-05-16 NOTE — Progress Notes (Signed)
   Katie [name] [birth year] [id number]   History:  54 y.o. [id number] presents for annual exam without GYN complaints. OCPs continuously with cycle every 3-4 months. History of menstrual migraines and menorrhagia that is well controlled with OCPs. BTL. 2008 endometrial ablation with no bleeding initially but a few years later began having heavy cycles again. Normal pap and mammogram history. T2DM and Hashimoto's managed primary care. Maternal grandmother with history of ovarian cancer. Has not had screening colonoscopy.   Gynecologic History Patient's last menstrual period was 04/25/2020. Period Duration (Days): 5 Period Pattern: Regular Menstrual Control: Maxi pad, Tampon Dysmenorrhea: (!) Mild Dysmenorrhea Symptoms: Cramping Contraception: OCP (estrogen/progesterone) Last Pap: 01/26/2018. Results were: normal Last mammogram: 12/01/2019. Results were: normal Last colonoscopy: Never   Past medical history, past surgical history, family history and social history were all reviewed and documented in the EPIC chart.  ROS:  A ROS was performed and pertinent positives and negatives are included.  Exam:  Vitals:   05/16/20 0816  BP: 122/80  Weight: 183 lb (83 kg)  Height: 5\' 4"  (1.626 m)   Body mass index is 31.41 kg/m.  General appearance:  Normal Thyroid:  Symmetrical, normal in size, without palpable masses or nodularity. Respiratory  Auscultation:  Clear without wheezing or rhonchi Cardiovascular  Auscultation:  Regular rate, without rubs, murmurs or gallops  Edema/varicosities:  Not grossly evident Abdominal  Soft,nontender, without masses, guarding or rebound.  Liver/spleen:  No organomegaly noted  Hernia:  None appreciated  Skin  Inspection:  Grossly normal   Breasts: Examined lying and sitting.   Right: Without masses, retractions, discharge or axillary adenopathy.   Left: Without masses, retractions, discharge or axillary adenopathy. Gentitourinary   Inguinal/mons:   Normal without inguinal adenopathy  External genitalia:  Normal  BUS/Urethra/Skene's glands:  Normal  Vagina:  Normal  Cervix:  Normal  Uterus:  Normal in size, shape and contour.  Midline and mobile  Adnexa/parametria:     Rt: Without masses or tenderness.   Lt: Without masses or tenderness.  Anus and perineum: Normal  Digital rectal exam: Normal sphincter tone without palpated masses or tenderness  Assessment/Plan:  54 y.o. [id number] for annual exam.   Well female exam with routine gynecological exam - Education provided on SBEs, importance of preventative screenings, current guidelines, high calcium diet, regular exercise, and multivitamin daily. Labs done with primary care.   Encounter for surveillance of contraceptive pills - Plan: levonorgestrel-ethinyl estradiol (ORSYTHIA) 0.1-20 MG-MCG tablet. Takes continuously with cycle every 3-4 months. Refill x 1 year provided.   History of endometrial ablation - 2008. No bleeding initially but started having heavy cycles a few years later.   Menorrhagia with regular cycle - OCPs continuously, managed well with this.   Screening for colon cancer - has not had screening colonoscopy.  Discussed current guidelines and importance of preventative screenings. Information provided on [facility].  She promises to schedule this soon.  Follow up in 1 year for annual      Doyle, 8:28 AM 05/16/2020

## 2020-05-16 NOTE — Patient Instructions (Addendum)
Schedule colonoscopy! [facility] [phone] [address] [place], [place] [address]  Health Maintenance, Female Adopting a healthy lifestyle and getting preventive care are important in promoting health and wellness. Ask your health care provider about:  The right schedule for you to have regular tests and exams.  Things you can do on your own to prevent diseases and keep yourself healthy. What should I know about diet, weight, and exercise? Eat a healthy diet   Eat a diet that includes plenty of vegetables, fruits, low-fat dairy products, and lean protein.  Do not eat a lot of foods that are high in solid fats, added sugars, or sodium. Maintain a healthy weight Body mass index (BMI) is used to identify weight problems. It estimates body fat based on height and weight. Your health care provider can help determine your BMI and help you achieve or maintain a healthy weight. Get regular exercise Get regular exercise. This is one of the most important things you can do for your health. Most adults should:  Exercise for at least 150 minutes each week. The exercise should increase your heart rate and make you sweat (moderate-intensity exercise).  Do strengthening exercises at least twice a week. This is in addition to the moderate-intensity exercise.  Spend less time sitting. Even light physical activity can be beneficial. Watch cholesterol and blood lipids Have your blood tested for lipids and cholesterol at 52 years of age, then have this test every 5 years. Have your cholesterol levels checked more often if:  Your lipid or cholesterol levels are high.  You are older than 52 years of age.  You are at high risk for heart disease. What should I know about cancer screening? Depending on your health history and family history, you may need to have cancer screening at various ages. This may include screening for:  Breast cancer.  Cervical cancer.  Colorectal cancer.  Skin  cancer.  Lung cancer. What should I know about heart disease, diabetes, and high blood pressure? Blood pressure and heart disease  High blood pressure causes heart disease and increases the risk of stroke. This is more likely to develop in people who have high blood pressure readings, are of African descent, or are overweight.  Have your blood pressure checked: ? Every 3-5 years if you are 18-39 years of age. ? Every year if you are 40 years old or older. Diabetes Have regular diabetes screenings. This checks your fasting blood sugar level. Have the screening done:  Once every three years after age 40 if you are at a normal weight and have a low risk for diabetes.  More often and at a younger age if you are overweight or have a high risk for diabetes. What should I know about preventing infection? Hepatitis B If you have a higher risk for hepatitis B, you should be screened for this virus. Talk with your health care provider to find out if you are at risk for hepatitis B infection. Hepatitis C Testing is recommended for:  Everyone born from 1945 through 1965.  Anyone with known risk factors for hepatitis C. Sexually transmitted infections (STIs)  Get screened for STIs, including gonorrhea and chlamydia, if: ? You are sexually active and are younger than 52 years of age. ? You are older than 52 years of age and your health care provider tells you that you are at risk for this type of infection. ? Your sexual activity has changed since you were last screened, and you   are at increased risk for chlamydia or gonorrhea. Ask your health care provider if you are at risk.  Ask your health care provider about whether you are at high risk for HIV. Your health care provider may recommend a prescription medicine to help prevent HIV infection. If you choose to take medicine to prevent HIV, you should first get tested for HIV. You should then be tested every 3 months for as long as you are taking  the medicine. Pregnancy  If you are about to stop having your period (premenopausal) and you may become pregnant, seek counseling before you get pregnant.  Take 400 to 800 micrograms (mcg) of folic acid every day if you become pregnant.  Ask for birth control (contraception) if you want to prevent pregnancy. Osteoporosis and menopause Osteoporosis is a disease in which the bones lose minerals and strength with aging. This can result in bone fractures. If you are 65 years old or older, or if you are at risk for osteoporosis and fractures, ask your health care provider if you should:  Be screened for bone loss.  Take a calcium or vitamin D supplement to lower your risk of fractures.  Be given hormone replacement therapy (HRT) to treat symptoms of menopause. Follow these instructions at home: Lifestyle  Do not use any products that contain nicotine or tobacco, such as cigarettes, e-cigarettes, and chewing tobacco. If you need help quitting, ask your health care provider.  Do not use street drugs.  Do not share needles.  Ask your health care provider for help if you need support or information about quitting drugs. Alcohol use  Do not drink alcohol if: ? Your health care provider tells you not to drink. ? You are pregnant, may be pregnant, or are planning to become pregnant.  If you drink alcohol: ? Limit how much you use to 0-1 drink a day. ? Limit intake if you are breastfeeding.  Be aware of how much alcohol is in your drink. In the [place] one drink equals one 12 oz bottle of beer (355 mL), one 5 oz glass of wine (148 mL), or one 1 oz glass of hard liquor (44 mL). General instructions  Schedule regular health, dental, and eye exams.  Stay current with your vaccines.  Tell your health care provider if: ? You often feel depressed. ? You have ever been abused or do not feel safe at home. Summary  Adopting a healthy lifestyle and getting preventive care are important in  promoting health and wellness.  Follow your health care provider's instructions about healthy diet, exercising, and getting tested or screened for diseases.  Follow your health care provider's instructions on monitoring your cholesterol and blood pressure. This information is not intended to replace advice given to you by your health care provider. Make sure you discuss any questions you have with your health care provider. Document Revised: 08/04/2018 Document Reviewed: 08/04/2018 Elsevier Patient Education  2020 Elsevier Inc.   Menopause Menopause is the normal time of life when menstrual periods stop completely. It is usually confirmed by 12 months without a menstrual period. The transition to menopause (perimenopause) most often happens between the ages of 45 and 55. During perimenopause, hormone levels change in your body, which can cause symptoms and affect your health. Menopause may increase your risk for:  Loss of bone (osteoporosis), which causes bone breaks (fractures).  Depression.  Hardening and narrowing of the arteries (atherosclerosis), which can cause heart attacks and strokes. What are the causes? This   condition is usually caused by a natural change in hormone levels that happens as you get older. The condition may also be caused by surgery to remove both ovaries (bilateral oophorectomy). What increases the risk? This condition is more likely to start at an earlier age if you have certain medical conditions or treatments, including:  A tumor of the pituitary gland in the brain.  A disease that affects the ovaries and hormone production.  Radiation treatment for cancer.  Certain cancer treatments, such as chemotherapy or hormone (anti-estrogen) therapy.  Heavy smoking and excessive alcohol use.  Family history of early menopause. This condition is also more likely to develop earlier in women who are very thin. What are the signs or symptoms? Symptoms of this  condition include:  Hot flashes.  Irregular menstrual periods.  Night sweats.  Changes in feelings about sex. This could be a decrease in sex drive or an increased comfort around your sexuality.  Vaginal dryness and thinning of the vaginal walls. This may cause painful intercourse.  Dryness of the skin and development of wrinkles.  Headaches.  Problems sleeping (insomnia).  Mood swings or irritability.  Memory problems.  Weight gain.  Hair growth on the face and chest.  Bladder infections or problems with urinating. How is this diagnosed? This condition is diagnosed based on your medical history, a physical exam, your age, your menstrual history, and your symptoms. Hormone tests may also be done. How is this treated? In some cases, no treatment is needed. You and your health care provider should make a decision together about whether treatment is necessary. Treatment will be based on your individual condition and preferences. Treatment for this condition focuses on managing symptoms. Treatment may include:  Menopausal hormone therapy (MHT).  Medicines to treat specific symptoms or complications.  Acupuncture.  Vitamin or herbal supplements. Before starting treatment, make sure to let your health care provider know if you have a personal or family history of:  Heart disease.  Breast cancer.  Blood clots.  Diabetes.  Osteoporosis. Follow these instructions at home: Lifestyle  Do not use any products that contain nicotine or tobacco, such as cigarettes and e-cigarettes. If you need help quitting, ask your health care provider.  Get at least 30 minutes of physical activity on 5 or more days each week.  Avoid alcoholic and caffeinated beverages, as well as spicy foods. This may help prevent hot flashes.  Get 7-8 hours of sleep each night.  If you have hot flashes, try: ? Dressing in layers. ? Avoiding things that may trigger hot flashes, such as spicy food,  warm places, or stress. ? Taking slow, deep breaths when a hot flash starts. ? Keeping a fan in your home and office.  Find ways to manage stress, such as deep breathing, meditation, or journaling.  Consider going to group therapy with other women who are having menopause symptoms. Ask your health care provider about recommended group therapy meetings. Eating and drinking  Eat a healthy, balanced diet that contains whole grains, lean protein, low-fat dairy, and plenty of fruits and vegetables.  Your health care provider may recommend adding more soy to your diet. Foods that contain soy include tofu, tempeh, and soy milk.  Eat plenty of foods that contain calcium and vitamin D for bone health. Items that are rich in calcium include low-fat milk, yogurt, beans, almonds, sardines, broccoli, and kale. Medicines  Take over-the-counter and prescription medicines only as told by your health care provider.  Talk with   your health care provider before starting any herbal supplements. If prescribed, take vitamins and supplements as told by your health care provider. These may include: ? Calcium. Women age 51 and older should get 1,200 mg (milligrams) of calcium every day. ? Vitamin D. Women need 600-800 International Units of vitamin D each day. ? Vitamins B12 and B6. Aim for 50 micrograms of B12 and 1.5 mg of B6 each day. General instructions  Keep track of your menstrual periods, including: ? When they occur. ? How heavy they are and how long they last. ? How much time passes between periods.  Keep track of your symptoms, noting when they start, how often you have them, and how long they last.  Use vaginal lubricants or moisturizers to help with vaginal dryness and improve comfort during sex.  Keep all follow-up visits as told by your health care provider. This is important. This includes any group therapy or counseling. Contact a health care provider if:  You are still having menstrual  periods after age 55.  You have pain during sex.  You have not had a period for 12 months and you develop vaginal bleeding. Get help right away if:  You have: ? Severe depression. ? Excessive vaginal bleeding. ? Pain when you urinate. ? A fast or irregular heart beat (palpitations). ? Severe headaches. ? Abdomen (abdominal) pain or severe indigestion.  You fell and you think you have a broken bone.  You develop leg or chest pain.  You develop vision problems.  You feel a lump in your breast. Summary  Menopause is the normal time of life when menstrual periods stop completely. It is usually confirmed by 12 months without a menstrual period.  The transition to menopause (perimenopause) most often happens between the ages of 45 and 55.  Symptoms can be managed through medicines, lifestyle changes, and complementary therapies such as acupuncture.  Eat a balanced diet that is rich in nutrients to promote bone health and heart health and to manage symptoms during menopause. This information is not intended to replace advice given to you by your health care provider. Make sure you discuss any questions you have with your health care provider. Document Revised: 07/24/2017 Document Reviewed: 09/13/2016 Elsevier Patient Education  2020 Elsevier Inc.   

## 2020-06-04 ENCOUNTER — Telehealth: Payer: Self-pay

## 2020-06-04 DIAGNOSIS — Z0000 Encounter for general adult medical examination without abnormal findings: Secondary | ICD-10-CM

## 2020-06-04 NOTE — Telephone Encounter (Signed)
Pt received the automated Emmi call reminder to schedule a colonoscopy.  She did not have it done last year and was not contacted about it she said and thinks it was more than likely d/t the Covid situation.  Pt is ready to schedule.  Pt is wanting referral placed for it.  Pt last seen by PCP in Aug 2021 for CPE.

## 2020-06-04 NOTE — Telephone Encounter (Signed)
Referral placed.

## 2020-06-07 ENCOUNTER — Encounter: Payer: Self-pay | Admitting: Family Medicine

## 2020-06-07 DIAGNOSIS — F419 Anxiety disorder, unspecified: Secondary | ICD-10-CM

## 2020-06-07 MED ORDER — ALPRAZOLAM 0.25 MG PO TABS: 0.2500 mg | ORAL_TABLET | Freq: Two times a day (BID) | ORAL | 0 refills | Status: DC | PRN

## 2020-06-07 NOTE — Telephone Encounter (Signed)
Requesting: Xanax Contract: 10/10/19 UDS: 10/10/19, low risk Last OV: 04/09/20 Next OV: N/A Last Refill: 05/02/20, #20--0 RF Database:   Please advise

## 2020-06-26 DIAGNOSIS — H9313 Tinnitus, bilateral: Secondary | ICD-10-CM | POA: Diagnosis not present

## 2020-07-06 ENCOUNTER — Encounter: Payer: Self-pay | Admitting: Gastroenterology

## 2020-07-16 DIAGNOSIS — Z20822 Contact with and (suspected) exposure to covid-19: Secondary | ICD-10-CM | POA: Diagnosis not present

## 2020-08-09 ENCOUNTER — Other Ambulatory Visit: Payer: Self-pay | Admitting: *Deleted

## 2020-08-30 ENCOUNTER — Ambulatory Visit (AMBULATORY_SURGERY_CENTER): Payer: BC Managed Care – PPO

## 2020-08-30 ENCOUNTER — Other Ambulatory Visit: Payer: Self-pay

## 2020-08-30 VITALS — Ht 64.0 in

## 2020-08-30 DIAGNOSIS — Z1211 Encounter for screening for malignant neoplasm of colon: Secondary | ICD-10-CM

## 2020-08-30 NOTE — Progress Notes (Signed)
No allergies to soy or egg Pt is not on blood thinners or diet pills Denies issues with sedation/intubation Denies atrial flutter/fib Denies constipation   Emmi instructions given to pt  Pt is aware of Covid safety and care partner requirements.   Pt refuses Plenvu prep d/t mango flavor.  Is agreeable with the Miralax prep.

## 2020-09-06 ENCOUNTER — Encounter: Payer: Self-pay | Admitting: Gastroenterology

## 2020-09-12 ENCOUNTER — Encounter: Payer: Self-pay | Admitting: Gastroenterology

## 2020-09-12 ENCOUNTER — Ambulatory Visit (AMBULATORY_SURGERY_CENTER): Payer: BC Managed Care – PPO | Admitting: Gastroenterology

## 2020-09-12 ENCOUNTER — Other Ambulatory Visit: Payer: Self-pay

## 2020-09-12 VITALS — BP 140/82 | HR 70 | Temp 98.2°F | Resp 12 | Ht 64.0 in | Wt 186.0 lb

## 2020-09-12 DIAGNOSIS — Z1211 Encounter for screening for malignant neoplasm of colon: Secondary | ICD-10-CM | POA: Diagnosis not present

## 2020-09-12 MED ORDER — SODIUM CHLORIDE 0.9 % IV SOLN
500.0000 mL | Freq: Once | INTRAVENOUS | Status: DC
Start: 2020-09-12 — End: 2020-09-12

## 2020-09-12 NOTE — Progress Notes (Signed)
Report to PACU, RN, vss, BBS= Clear.  

## 2020-09-12 NOTE — Progress Notes (Signed)
VS- Katie [name]  Pt's states no medical or surgical changes since previsit or office visit.  

## 2020-09-12 NOTE — Patient Instructions (Signed)
HANDOUTS PROVIDED ON: HEMORRHOIDS  Your next colonoscopy should occur in 10 years.    You may resume your previous diet and medication schedule.  Thank you for allowing [place] to care for you today!!!   YOU HAD AN ENDOSCOPIC PROCEDURE TODAY AT [facility]:   Refer to the procedure report that was given to you for any specific questions about what was found during the examination.  If the procedure report does not answer your questions, please call your gastroenterologist to clarify.  If you requested that your care partner not be given the details of your procedure findings, then the procedure report has been included in a sealed envelope for you to review at your convenience later.  YOU SHOULD EXPECT: Some feelings of bloating in the abdomen. Passage of more gas than usual.  Walking can help get rid of the air that was put into your GI tract during the procedure and reduce the bloating. If you had a lower endoscopy (such as a colonoscopy or flexible sigmoidoscopy) you may notice spotting of blood in your stool or on the toilet paper. If you underwent a bowel prep for your procedure, you may not have a normal bowel movement for a few days.  Please Note:  You might notice some irritation and congestion in your nose or some drainage.  This is from the oxygen used during your procedure.  There is no need for concern and it should clear up in a day or so.  SYMPTOMS TO REPORT IMMEDIATELY:   Following lower endoscopy (colonoscopy or flexible sigmoidoscopy):  Excessive amounts of blood in the stool  Significant tenderness or worsening of abdominal pains  Swelling of the abdomen that is new, acute  Fever of 100F or higher  For urgent or emergent issues, a gastroenterologist can be reached at any hour by calling [phone]. Do not use MyChart messaging for urgent concerns.    DIET:  We do recommend a small meal at first, but then you may proceed to your regular diet.  Drink  plenty of fluids but you should avoid alcoholic beverages for 24 hours.  ACTIVITY:  You should plan to take it easy for the rest of today and you should NOT DRIVE or use heavy machinery until tomorrow (because of the sedation medicines used during the test).    FOLLOW UP: Our staff will call the number listed on your records Friday morning between 7:15 am and 8:15 am to check on you and address any questions or concerns that you may have regarding the information given to you following your procedure. If we do not reach you, we will leave a message.  We will attempt to reach you two times.  During this call, we will ask if you have developed any symptoms of COVID 19. If you develop any symptoms (ie: fever, flu-like symptoms, shortness of breath, cough etc.) before then, please call [phone].  If you test positive for Covid 19 in the 2 weeks post procedure, please call and report this information to [place].    If any biopsies were taken you will be contacted by phone or by letter within the next 1-3 weeks.  Please call us at [phone] if you have not heard about the biopsies in 3 weeks.    SIGNATURES/CONFIDENTIALITY: You and/or your care partner have signed paperwork which will be entered into your electronic medical record.  These signatures attest to the fact that that the information above on your After Visit Summary  has been reviewed and is understood.  Full responsibility of the confidentiality of this discharge information lies with you and/or your care-partner. 

## 2020-09-12 NOTE — Op Note (Signed)
[facility] Patient Name: Katie [name] Procedure Date: 09/12/2020 9:48 AM MRN: [id number] Endoscopist: Milus Banister , MD Age: 55 Referring MD:  Date of Birth: Jun 12, 1966 Gender: Female Account #: [id number] Procedure:                Colonoscopy Indications:              Screening for colorectal malignant neoplasm Medicines:                Monitored Anesthesia Care Procedure:                Pre-Anesthesia Assessment:                           - Prior to the procedure, a History and Physical                            was performed, and patient medications and                            allergies were reviewed. The patient's tolerance of                            previous anesthesia was also reviewed. The risks                            and benefits of the procedure and the sedation                            options and risks were discussed with the patient.                            All questions were answered, and informed consent                            was obtained. Prior Anticoagulants: The patient has                            taken no previous anticoagulant or antiplatelet                            agents. ASA Grade Assessment: II - A patient with                            mild systemic disease. After reviewing the risks                            and benefits, the patient was deemed in                            satisfactory condition to undergo the procedure.                           After obtaining informed consent, the colonoscope  was passed under direct vision. Throughout the                            procedure, the patient's blood pressure, pulse, and                            oxygen saturations were monitored continuously. The                            Olympus PCF-[id number]) Colonoscope was                            introduced through the anus and advanced to the the                            cecum, identified  by appendiceal orifice and                            ileocecal valve. The colonoscopy was performed                            without difficulty. The patient tolerated the                            procedure well. The quality of the bowel                            preparation was good. The ileocecal valve,                            appendiceal orifice, and rectum were photographed. Scope In: 9:54:14 AM Scope Out: 10:10:17 AM Scope Withdrawal Time: 0 hours 8 minutes 24 seconds  Total Procedure Duration: 0 hours 16 minutes 3 seconds  Findings:                 External and internal hemorrhoids were found. The                            hemorrhoids were small.                           The exam was otherwise without abnormality on                            direct and retroflexion views. Complications:            No immediate complications. Estimated blood loss:                            None. Estimated Blood Loss:     Estimated blood loss: none. Impression:               - External and internal hemorrhoids.                           - The examination was otherwise normal on direct  and retroflexion views.                           - No polyps or cancers. Recommendation:           - Patient has a contact number available for                            emergencies. The signs and symptoms of potential                            delayed complications were discussed with the                            patient. Return to normal activities tomorrow.                            Written discharge instructions were provided to the                            patient.                           - Resume previous diet.                           - Continue present medications.                           - Repeat colonoscopy in 10 years for screening. Milus Banister, MD 09/12/2020 10:13:33 AM This report has been signed electronically.

## 2020-09-14 ENCOUNTER — Telehealth: Payer: Self-pay | Admitting: *Deleted

## 2020-09-14 NOTE — Telephone Encounter (Signed)
  Follow up Call-  Call back number 09/12/2020  Post procedure Call Back phone  # [phone]  Permission to leave phone message Yes  Some recent data might be hidden     Patient questions:  Do you have a fever, pain , or abdominal swelling? No. Pain Score  0 *  Have you tolerated food without any problems? Yes.    Have you been able to return to your normal activities? Yes.    Do you have any questions about your discharge instructions: Diet   No. Medications  No. Follow up visit  No.  Do you have questions or concerns about your Care? No.  Actions: * If pain score is 4 or above: No action needed, pain <4.  1. Have you developed a fever since your procedure? no  2.   Have you had an respiratory symptoms (SOB or cough) since your procedure? no  3.   Have you tested positive for COVID 19 since your procedure no  4.   Have you had any family members/close contacts diagnosed with the COVID 19 since your procedure?  no   If yes to any of these questions please route to Joylene John, RN and Joella Prince, RN

## 2020-10-02 ENCOUNTER — Encounter: Payer: Self-pay | Admitting: Family Medicine

## 2020-10-02 DIAGNOSIS — F419 Anxiety disorder, unspecified: Secondary | ICD-10-CM

## 2020-10-02 MED ORDER — ALPRAZOLAM 0.25 MG PO TABS: 0.2500 mg | ORAL_TABLET | Freq: Two times a day (BID) | ORAL | 0 refills | Status: DC | PRN

## 2020-10-02 NOTE — Telephone Encounter (Signed)
Requesting: alprazolam 0.25mg  Contract: None UDS: 10/10/2019 Last Visit: 04/09/2020 Next Visit: None Last Refill: 06/07/2020 #20 and 0RF Pt sig: 1 tab bid prn  Please Advise

## 2020-10-19 ENCOUNTER — Other Ambulatory Visit: Payer: Self-pay

## 2020-10-19 ENCOUNTER — Ambulatory Visit: Payer: BC Managed Care – PPO | Admitting: Family Medicine

## 2020-10-19 ENCOUNTER — Encounter: Payer: Self-pay | Admitting: Family Medicine

## 2020-10-19 VITALS — BP 122/70 | HR 77 | Temp 98.8°F | Wt 185.2 lb

## 2020-10-19 DIAGNOSIS — E039 Hypothyroidism, unspecified: Secondary | ICD-10-CM

## 2020-10-19 DIAGNOSIS — E1100 Type 2 diabetes mellitus with hyperosmolarity without nonketotic hyperglycemic-hyperosmolar coma (NKHHC): Secondary | ICD-10-CM | POA: Diagnosis not present

## 2020-10-19 DIAGNOSIS — E1165 Type 2 diabetes mellitus with hyperglycemia: Secondary | ICD-10-CM | POA: Diagnosis not present

## 2020-10-19 DIAGNOSIS — G4700 Insomnia, unspecified: Secondary | ICD-10-CM

## 2020-10-19 DIAGNOSIS — I10 Essential (primary) hypertension: Secondary | ICD-10-CM | POA: Diagnosis not present

## 2020-10-19 DIAGNOSIS — F419 Anxiety disorder, unspecified: Secondary | ICD-10-CM

## 2020-10-19 DIAGNOSIS — E785 Hyperlipidemia, unspecified: Secondary | ICD-10-CM

## 2020-10-19 LAB — LIPID PANEL
Cholesterol: 148 mg/dL (ref 0–200)
HDL: 49.4 mg/dL (ref >39.00)
LDL Cholesterol: 66 mg/dL (ref 0–99)
NonHDL: 98.48
Total CHOL/HDL Ratio: 3
Triglycerides: 162 mg/dL — ABNORMAL HIGH (ref 0.0–149.0)
VLDL: 32.4 mg/dL (ref 0.0–40.0)

## 2020-10-19 LAB — COMPREHENSIVE METABOLIC PANEL
ALT: 12 U/L (ref 0–35)
AST: 15 U/L (ref 0–37)
Albumin: 4.2 g/dL (ref 3.5–5.2)
Alkaline Phosphatase: 41 U/L (ref 39–117)
BUN: 13 mg/dL (ref 6–23)
CO2: 29 mEq/L (ref 19–32)
Calcium: 9.6 mg/dL (ref 8.4–10.5)
Chloride: 103 mEq/L (ref 96–112)
Creatinine, Ser: 0.79 mg/dL (ref 0.40–1.20)
GFR: 85.96 mL/min (ref >60.00)
Glucose, Bld: 94 mg/dL (ref 70–99)
Potassium: 4.6 mEq/L (ref 3.5–5.1)
Sodium: 136 mEq/L (ref 135–145)
Total Bilirubin: 0.4 mg/dL (ref 0.2–1.2)
Total Protein: 7.3 g/dL (ref 6.0–8.3)

## 2020-10-19 LAB — MICROALBUMIN / CREATININE URINE RATIO
Creatinine,U: 96.3 mg/dL
Microalb Creat Ratio: 1.2 mg/g (ref 0.0–30.0)
Microalb, Ur: 1.2 mg/dL (ref 0.0–1.9)

## 2020-10-19 LAB — TSH: TSH: 1.59 u[IU]/mL (ref 0.35–4.50)

## 2020-10-19 LAB — HEMOGLOBIN A1C: Hgb A1c MFr Bld: 6.1 % (ref 4.6–6.5)

## 2020-10-19 MED ORDER — QUETIAPINE FUMARATE ER 150 MG PO TB24: 150.0000 mg | ORAL_TABLET | Freq: Every day | ORAL | 0 refills | Status: DC

## 2020-10-19 NOTE — Patient Instructions (Signed)
Quetiapine Extended Release Tablets What is this medicine? QUETIAPINE (kwe TYE a peen) is an antipsychotic. It is used to treat schizophrenia and bipolar disorder, also known as manic-depression. It is also used to treat major depression in combination with antidepressants. This medicine may be used for other purposes; ask your health care provider or pharmacist if you have questions. COMMON BRAND NAME(S): Seroquel XR What should I tell my health care provider before I take this medicine? They need to know if you have any of these conditions:  blockage in your bowel  cataracts  constipation  dementia  diabetes  difficulty swallowing  glaucoma  heart disease  high levels of prolactin  history of breast cancer  history of irregular heartbeat  liver disease  low blood counts, like low white cell, platelet, or red cell counts  low blood pressure  Parkinson's disease  prostate disease  seizures  suicidal thoughts, plans or attempt; a previous suicide attempt by you or a family member  thyroid disease  trouble passing urine  an unusual or allergic reaction to quetiapine, other medicines, foods, dyes, or preservatives  pregnant or trying to get pregnant  breast-feeding How should I use this medicine? Take this medicine by mouth with a glass of water. Follow the directions on the prescription label. Do not cut, crush or chew this medicine. Take this medicine on an empty stomach or with a light meal. Do not take this medicine with a full heavy meal. Take your medicine at regular intervals. Do not take it more often than directed. Do not stop taking except on your doctor's advice. A special MedGuide will be given to you by the pharmacist with each prescription and refill. Be sure to read this information carefully each time. Talk to your pediatrician regarding the use of this medicine in children. While this drug may be prescribed for children as young as 10 years for  selected conditions, precautions do apply. Patients over age 65 years may have a stronger reaction to this medicine and need smaller doses. Overdosage: If you think you have taken too much of this medicine contact a poison control center or emergency room at once. NOTE: This medicine is only for you. Do not share this medicine with others. What if I miss a dose? If you miss a dose, take it as soon as you can. If it is almost time for your next dose, take only that dose. Do not take double or extra doses. What may interact with this medicine? Do not take this medicine with any of the following medications:  cisapride  dronedarone  metoclopramide  pimozide  thioridazine This medicine may also interact with the following medications:  alcohol  antihistamines for allergy, cough, and cold  atropine  avasimibe  certain antivirals for HIV or hepatitis  certain medicines for anxiety or sleep  certain medicines for bladder problems like oxybutynin, tolterodine  certain medicines for depression like amitriptyline, fluoxetine, nefazodone, sertraline  certain medicines for fungal infections like fluconazole, ketoconazole, itraconazole, posaconazole  certain medicines for stomach problems like dicyclomine, hyoscyamine  certain medicines for travel sickness like scopolamine  cimetidine  general anesthetics like halothane, isoflurane, methoxyflurane, propofol  ipratropium  levodopa or other medicines for Parkinson's disease  medicines for blood pressure  medicines for seizures  medicines that relax muscles for surgery  narcotic medicines for pain  other medicines that prolong the QT interval (cause an abnormal heart rhythm)  phenothiazines like chlorpromazine, prochlorperazine  rifampin  St. John's wort This list   may not describe all possible interactions. Give your health care provider a list of all the medicines, herbs, non-prescription drugs, or dietary  supplements you use. Also tell them if you smoke, drink alcohol, or use illegal drugs. Some items may interact with your medicine. What should I watch for while using this medicine? Visit your health care professional for regular checks on your progress. Tell your health care professional if symptoms do not start to get better or if they get worse. Do not stop taking except on your health care professional's advice. You may develop a severe reaction. Your health care professional will tell you how much medicine to take. You may need to have an eye exam before and during use of this medicine. Patients and their families should watch out for new or worsening depression or thoughts of suicide. Also watch out for sudden changes in feelings such as feeling anxious, agitated, panicky, irritable, hostile, aggressive, impulsive, severely restless, overly excited and hyperactive, or not being able to sleep. If this happens, especially at the beginning of treatment or after a change in dose, call your healthcare professional. This medicine may increase blood sugar. Ask your health care provider if changes in diet or medicines are needed if you have diabetes. You may get drowsy or dizzy. Do not drive, use machinery, or do anything that needs mental alertness until you know how this medicine affects you. Do not stand or sit up quickly, especially if you are an older patient. This reduces the risk of dizzy or fainting spells. Alcohol may interfere with the effect of this medicine. Avoid alcoholic drinks. This drug can cause problems with controlling your body temperature. It can lower the response of your body to cold temperatures. If possible, stay indoors during cold weather. If you must go outdoors, wear warm clothes. It can also lower the response of your body to heat. Do not overheat. Do not over-exercise. Stay out of the sun when possible. If you must be in the sun, wear cool clothing. Drink plenty of water. If you  have trouble controlling your body temperature, call your health care provider right away. What side effects may I notice from receiving this medicine? Side effects that you should report to your doctor or health care professional as soon as possible:  allergic reactions like skin rash, itching or hives, swelling of the face, lips, or tongue  breathing problems  changes in vision  confusion  elevated mood, decreased need for sleep, racing thoughts, impulsive behavior  eye pain  fast, irregular heartbeat  fever or chills, sore throat  inability to keep still  males: prolonged or painful erection  problems with balance, talking, walking  redness, blistering, peeling, or loosening of the skin, including inside the mouth  seizures  signs and symptoms of high blood sugar such as being more thirsty or hungry or having to urinate more than normal. You may also feel very tired or have blurry vision  signs and symptoms of hypothyroidism like fatigue; increased sensitivity to cold; weight gain; hoarseness; thinning hair  signs and symptoms of low blood pressure like dizziness; feeling faint or lightheaded; falls; unusually weak or tired  signs and symptoms of neuroleptic malignant syndrome like confusion; fast, irregular heartbeat; high fever; increased sweating; stiff muscles  sudden numbness or weakness of the face, arm, or leg  suicidal thoughts or other mood changes  trouble swallowing  uncontrollable movements of the arms, face, head, mouth, neck, or upper body Side effects that usually do   not require medical attention (report to your doctor or health care professional if they continue or are bothersome):  change in sex drive or performance  constipation  drowsiness  dry mouth  upset stomach  weight gain This list may not describe all possible side effects. Call your doctor for medical advice about side effects. You may report side effects to FDA at  1-800-FDA-1088. Where should I keep my medicine? Keep out of the reach of children. Store at room temperature between 15 and 30 degrees C (59 and 86 degrees F). Throw away any unused medicine after the expiration date. NOTE: This sheet is a summary. It may not cover all possible information. If you have questions about this medicine, talk to your doctor, pharmacist, or health care provider.  2021 Elsevier/Gold Standard (2019-06-29 14:10:38)  

## 2020-10-19 NOTE — Progress Notes (Signed)
Patient ID: Katie [name], female    DOB: [birth year]  Age: 55 y.o. MRN: [id number]    Subjective:  Subjective  HPI Lindalou Soltis presents for f/u bp, chol and dm.  Pt would also like to wean off the seroquel and pristiq.  She feels like her emotons are all over the place-- up and down   Review of Systems  Constitutional: Negative for appetite change, diaphoresis, fatigue and unexpected weight change.  Eyes: Negative for pain, redness and visual disturbance.  Respiratory: Negative for cough, chest tightness, shortness of breath and wheezing.   Cardiovascular: Negative for chest pain, palpitations and leg swelling.  Endocrine: Negative for cold intolerance, heat intolerance, polydipsia, polyphagia and polyuria.  Genitourinary: Negative for difficulty urinating, dysuria and frequency.  Neurological: Negative for dizziness, light-headedness, numbness and headaches.  Psychiatric/Behavioral: Positive for decreased concentration. Negative for agitation, behavioral problems, confusion, dysphoric mood, hallucinations, self-injury, sleep disturbance and suicidal ideas. The patient is nervous/anxious. The patient is not hyperactive.     History Past Medical History:  Diagnosis Date  . Allergy   . Anemia   . Anxiety   . Blood transfusion without reported diagnosis   . Hyperlipidemia   . Hypertension   . Migraine   . PVC (premature ventricular contraction)   . RLS (restless legs syndrome)   . Thyroid disease     She has a past surgical history that includes Tubal ligation (04/1999); Carpal tunnel release; Septoplasty (6/05); Cryoablation (11/04); Ectopic pregnancy surgery (1998); Bunionectomy (06/04/2009); Endometrial ablation; and Upper gastrointestinal endoscopy.   Her family history includes Alzheimer's disease in her maternal grandmother; Aneurysm in her mother; Cancer in her maternal grandfather, maternal grandmother, paternal grandfather, and paternal grandmother; Colon cancer in her  paternal grandfather; Depression in her father; Diabetes in her maternal grandmother; Hypertension in her maternal grandmother and sister; Lung cancer in her maternal grandfather; Osteoporosis in her maternal grandmother; Thyroid disease in her sister; Uterine cancer in her maternal grandmother.She reports that she quit smoking about 25 years ago. She smoked 1.00 pack per day. She has never used smokeless tobacco. She reports current alcohol use. She reports that she does not use drugs.  Current Outpatient Medications on File Prior to Visit  Medication Sig Dispense Refill  . ALPRAZolam (XANAX) 0.25 MG tablet Take 1 tablet (0.25 mg total) by mouth 2 (two) times daily as needed for anxiety. 20 tablet 0  . amLODipine (NORVASC) 5 MG tablet Take 1 tablet (5 mg total) by mouth daily. 90 tablet 1  . cetirizine (ZYRTEC) 10 MG tablet Take 1 tablet (10 mg total) by mouth daily.    Marland Kitchen desvenlafaxine (PRISTIQ) 100 MG 24 hr tablet Take 1 tablet (100 mg total) by mouth daily. 90 tablet 3  . fenofibrate 160 MG tablet Take 1 tablet (160 mg total) by mouth daily. 90 tablet 1  . levonorgestrel-ethinyl estradiol (ORSYTHIA) 0.1-20 MG-MCG tablet Take 1 tablet by mouth daily. 84 tablet 3  . levothyroxine (SYNTHROID) 137 MCG tablet Take 1 tablet (137 mcg total) by mouth daily with breakfast. 90 tablet 3  . rosuvastatin (CRESTOR) 10 MG tablet Take 1 tablet (10 mg total) by mouth daily. 90 tablet 1  . valACYclovir (VALTREX) 1000 MG tablet Take 1 tablet (1,000 mg total) by mouth 3 (three) times daily. (Patient taking differently: Take 1,000 mg by mouth 3 (three) times daily. As needed.) 30 tablet 2   No current facility-administered medications on file prior to visit.     Objective:  Objective  Physical Exam  Vitals and nursing note reviewed.  Constitutional:      Appearance: She is well-developed and well-nourished.  HENT:     Head: Normocephalic and atraumatic.  Eyes:     Extraocular Movements: EOM normal.      Conjunctiva/sclera: Conjunctivae normal.  Neck:     Thyroid: No thyromegaly.     Vascular: No carotid bruit or JVD.  Cardiovascular:     Rate and Rhythm: Normal rate and regular rhythm.     Heart sounds: Normal heart sounds. No murmur heard.   Pulmonary:     Effort: Pulmonary effort is normal. No respiratory distress.     Breath sounds: Normal breath sounds. No wheezing or rales.  Chest:     Chest wall: No tenderness.  Musculoskeletal:        General: No edema.     Cervical back: Normal range of motion and neck supple.  Neurological:     Mental Status: She is alert and oriented to person, place, and time.  Psychiatric:        Attention and Perception: Attention and perception normal.        Mood and Affect: Mood is anxious. Affect is labile.        Speech: Speech normal.        Behavior: Behavior normal.        Thought Content: Thought content normal.        Cognition and Memory: Cognition normal.    BP 122/70 (BP Location: Right Arm, Patient Position: Sitting, Cuff Size: Normal)   Pulse 77   Temp 98.8 F (37.1 C) (Oral)   Wt 185 lb 3.2 oz (84 kg)   SpO2 99%   BMI 31.79 kg/m  Wt Readings from Last 3 Encounters:  10/19/20 185 lb 3.2 oz (84 kg)  09/12/20 186 lb (84.4 kg)  05/16/20 183 lb (83 kg)     Lab Results  Component Value Date   WBC 4.2 04/09/2020   HGB 11.9 (L) 04/09/2020   HCT 36.4 04/09/2020   PLT 342.0 04/09/2020   GLUCOSE 119 (H) 04/09/2020   CHOL 132 04/09/2020   TRIG 235.0 (H) 04/09/2020   HDL 43.10 04/09/2020   LDLDIRECT 62.0 04/09/2020   LDLCALC 64 10/10/2019   ALT 12 04/09/2020   AST 14 04/09/2020   NA 137 04/09/2020   K 4.9 04/09/2020   CL 102 04/09/2020   CREATININE 0.86 04/09/2020   BUN 12 04/09/2020   CO2 27 04/09/2020   TSH 1.05 04/09/2020   HGBA1C 5.9 04/09/2020   MICROALBUR 1.0 03/18/2018    US Venous Img Lower Unilateral Left  Result Date: 03/03/2020 CLINICAL DATA:  Leg pain. EXAM: LEFT LOWER EXTREMITY VENOUS DOPPLER  ULTRASOUND TECHNIQUE: Gray-scale sonography with compression, as well as color and duplex ultrasound, were performed to evaluate the deep venous system(s) from the level of the common femoral vein through the popliteal and proximal calf veins. COMPARISON:  None. FINDINGS: VENOUS Normal compressibility of the common femoral, superficial femoral, and popliteal veins, as well as the visualized calf veins. Visualized portions of profunda femoral vein and great saphenous vein unremarkable. No filling defects to suggest DVT on grayscale or color Doppler imaging. Doppler waveforms show normal direction of venous flow, normal respiratory plasticity and response to augmentation. Limited views of the contralateral common femoral vein are unremarkable. OTHER Hypoechoic collection within the soft tissues of the LEFT calf, measuring 3.1 cm in length and 6 mm thickness, compatible with hematoma versus ruptured Baker cyst. Limitations: none IMPRESSION:  1. No DVT. 2. Hypoechoic fluid-like collection within the soft tissues of the LEFT calf, measuring 3.1 cm in length and 6 mm thickness, compatible with hematoma versus ruptured Baker cyst. Electronically Signed   By: Franki Cabot M.D.   On: 03/03/2020 09:22     Assessment & Plan:  Plan  I have discontinued Myeasha Hasten's QUEtiapine. I am also having her start on QUEtiapine Fumarate. Additionally, I am having her maintain her cetirizine, valACYclovir, amLODipine, desvenlafaxine, fenofibrate, levothyroxine, rosuvastatin, levonorgestrel-ethinyl estradiol, and ALPRAZolam.  Meds ordered this encounter  Medications  . QUEtiapine Fumarate (SEROQUEL XR) 150 MG 24 hr tablet    Sig: Take 1 tablet (150 mg total) by mouth at bedtime.    Dispense:  30 tablet    Refill:  0    Problem List Items Addressed This Visit      Unprioritized   Anxiety    Pt is wanting to come off meds First wean off seroquel -- then pristiq Refer to psych      Relevant Orders   Ambulatory  referral to Psychiatry   HTN (hypertension)    Well controlled, no changes to meds. Encouraged heart healthy diet such as the DASH diet and exercise as tolerated.       Relevant Orders   Lipid panel   Comprehensive metabolic panel   TSH   Hemoglobin A1c   Microalbumin / creatinine urine ratio   Hyperlipidemia    Tolerating statin, encouraged heart healthy diet, avoid trans fats, minimize simple carbs and saturated fats. Increase exercise as tolerated      Hypothyroidism    Check labs  con't meds      Relevant Orders   TSH   Insomnia    Pt would like to wean off seroquel       Relevant Medications   QUEtiapine Fumarate (SEROQUEL XR) 150 MG 24 hr tablet   Type 2 diabetes mellitus with hyperglycemia, without long-term current use of insulin (HCC)    hgba1c to be checked , minimize simple carbs. Increase exercise as tolerated. Continue current meds        Other Visit Diagnoses    DM hyperosmolarity type II, uncontrolled ([place])    -  Primary   Relevant Orders   Lipid panel   Comprehensive metabolic panel   TSH   Hemoglobin A1c   Microalbumin / creatinine urine ratio      Follow-up: Return in about 6 months (around 04/18/2021), or if symptoms worsen or fail to improve.  [name], DO

## 2020-10-19 NOTE — Assessment & Plan Note (Signed)
Pt is wanting to come off meds First wean off seroquel -- then pristiq Refer to psych

## 2020-10-19 NOTE — Assessment & Plan Note (Signed)
Check labs con't meds 

## 2020-10-19 NOTE — Assessment & Plan Note (Signed)
Well controlled, no changes to meds. Encouraged heart healthy diet such as the DASH diet and exercise as tolerated.  °

## 2020-10-19 NOTE — Assessment & Plan Note (Signed)
Pt would like to wean off seroquel

## 2020-10-19 NOTE — Assessment & Plan Note (Signed)
Tolerating statin, encouraged heart healthy diet, avoid trans fats, minimize simple carbs and saturated fats. Increase exercise as tolerated 

## 2020-10-19 NOTE — Assessment & Plan Note (Signed)
hgba1c to be checked, minimize simple carbs. Increase exercise as tolerated. Continue current meds  

## 2020-10-21 ENCOUNTER — Encounter: Payer: Self-pay | Admitting: Family Medicine

## 2020-10-22 NOTE — Telephone Encounter (Signed)
Recheck 3 months.

## 2020-10-22 NOTE — Telephone Encounter (Signed)
FYI

## 2020-10-29 ENCOUNTER — Other Ambulatory Visit: Payer: Self-pay | Admitting: Family Medicine

## 2020-10-29 ENCOUNTER — Encounter: Payer: Self-pay | Admitting: Family Medicine

## 2020-10-29 DIAGNOSIS — I10 Essential (primary) hypertension: Secondary | ICD-10-CM

## 2020-10-29 DIAGNOSIS — G4700 Insomnia, unspecified: Secondary | ICD-10-CM

## 2020-10-29 DIAGNOSIS — E785 Hyperlipidemia, unspecified: Secondary | ICD-10-CM

## 2020-10-30 ENCOUNTER — Other Ambulatory Visit: Payer: Self-pay | Admitting: Family Medicine

## 2020-10-30 DIAGNOSIS — G4700 Insomnia, unspecified: Secondary | ICD-10-CM

## 2020-10-30 MED ORDER — FENOFIBRATE 160 MG PO TABS: 160.0000 mg | ORAL_TABLET | Freq: Every day | ORAL | 1 refills | Status: DC

## 2020-10-30 MED ORDER — AMLODIPINE BESYLATE 5 MG PO TABS: 5.0000 mg | ORAL_TABLET | Freq: Every day | ORAL | 1 refills | Status: DC

## 2020-11-07 DIAGNOSIS — Z1231 Encounter for screening mammogram for malignant neoplasm of breast: Secondary | ICD-10-CM | POA: Diagnosis not present

## 2020-11-07 LAB — HM MAMMOGRAPHY

## 2020-11-15 ENCOUNTER — Encounter: Payer: Self-pay | Admitting: Family Medicine

## 2020-11-15 DIAGNOSIS — F419 Anxiety disorder, unspecified: Secondary | ICD-10-CM

## 2020-11-16 MED ORDER — ALPRAZOLAM 0.25 MG PO TABS: 0.2500 mg | ORAL_TABLET | Freq: Two times a day (BID) | ORAL | 0 refills | Status: DC | PRN

## 2020-11-16 NOTE — Telephone Encounter (Signed)
Requesting: Xanax Contract: 10/10/2019 UDS: 10/10/2019 Last OV: 10/19/2020 Next OV:  Last Refill: 10/02/2020, #20--0 RF Database:   Please advise

## 2020-12-13 ENCOUNTER — Ambulatory Visit (INDEPENDENT_AMBULATORY_CARE_PROVIDER_SITE_OTHER): Payer: BC Managed Care – PPO | Admitting: Psychiatry

## 2020-12-13 ENCOUNTER — Encounter (HOSPITAL_COMMUNITY): Payer: Self-pay | Admitting: Psychiatry

## 2020-12-13 ENCOUNTER — Other Ambulatory Visit: Payer: Self-pay

## 2020-12-13 VITALS — BP 153/96 | HR 88 | Ht 63.0 in | Wt 178.0 lb

## 2020-12-13 DIAGNOSIS — F5102 Adjustment insomnia: Secondary | ICD-10-CM | POA: Diagnosis not present

## 2020-12-13 DIAGNOSIS — F0630 Mood disorder due to known physiological condition, unspecified: Secondary | ICD-10-CM | POA: Diagnosis not present

## 2020-12-13 DIAGNOSIS — F411 Generalized anxiety disorder: Secondary | ICD-10-CM | POA: Diagnosis not present

## 2020-12-13 MED ORDER — LAMOTRIGINE 25 MG PO TABS: 25.0000 mg | ORAL_TABLET | Freq: Every day | ORAL | 0 refills | Status: DC

## 2020-12-13 MED ORDER — ZOLPIDEM TARTRATE 10 MG PO TABS: 10.0000 mg | ORAL_TABLET | Freq: Every evening | ORAL | 0 refills | Status: DC | PRN

## 2020-12-13 NOTE — Progress Notes (Signed)
Psychiatric Initial Adult Assessment   Patient Identification: Katie [name] MRN:  [id number] Date of Evaluation:  12/13/2020 Referral Source: primary care Chief Complaint:  Establish care , anxiety, insomnia Chief Complaint    office visit     Visit Diagnosis:    ICD-10-CM   1. Adjustment insomnia  F51.02   2. Mood disorder in conditions classified elsewhere  F06.30   3. 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  Somewhat distant relationship at times with her husband.  She denies feeling hopeless or depressed on a day-to-day basis.  She has been on medication belsorma for sleep and it did not help but later on changed to Seroquel and then Seroquel dose was increased it made her feel foggy or numb and she wanted to taper off now she has tapered it off currently she is not on Seroquel.  She has been on Prozac for mood symptoms including mood swings it was later on changed to [place] has helped some  She goes to bed around 830 to 9 but she feels difficult to shut off her brain.  Even if she sleeps she wakes up in the middle of the night couple of times She wakes up tired in the morning.  She has had a sleep study done 11 years ago that did not show any significant sleep apnea she is not interested to repeat a sleep study she does acknowledge that that she snores  In regarding depression she denies symptoms.  Does not  endorse psychotic symptoms paranoia or hallucinations does not endorse manic symptoms currently or in the past  Primary care physician did not wanted to add more medication and referred her here for basically insomnia.  She is also on small dose of Xanax she takes as needed for agitation or for sleep but it makes her groggy if she takes it during the daytime  Patient does acknowledge being moody at times or agitated easily depending upon the job stress.  Does not endorse agitation last for long.  It comes and goes  Aggravating factors; job stress.  Distant relationship with one of her son. Modifying factors sister,, camping  Duration since young age  Past admission denies  Drug use denies  Alcohol use sporadically 1 beer  Past suicide attempt denies  She was seen at age 65 by her counselor after her father's death 1 time no medication given       Past Psychiatric History: insomnia, anxiety  Previous Psychotropic Medications: Yes  Zoloft, seroquel  Substance Abuse History in the last 12 months:  No.  Consequences of Substance Abuse: NA  Past Medical History:  Past Medical History:  Diagnosis Date  . Allergy   . Anemia   . Anxiety   . Blood transfusion without reported diagnosis   . Hyperlipidemia   . Hypertension   . Migraine   . PVC (premature ventricular contraction)   . RLS (restless legs syndrome)   .  Thyroid disease     Past Surgical History:  Procedure Laterality Date  . BUNIONECTOMY  06/04/2009   Right foot  . CARPAL TUNNEL RELEASE     Bilateral  . CRYOABLATION  11/04  . ECTOPIC PREGNANCY SURGERY  1998  . ENDOMETRIAL ABLATION    . SEPTOPLASTY  6/05  . TUBAL LIGATION  04/1999  . UPPER GASTROINTESTINAL ENDOSCOPY      Family Psychiatric History: Dad: committed suicide, see chart , Niece; depression  Family History:  Family History  Problem Relation Age of Onset  . Depression Father        Suicide  . Aneurysm Mother        Brain  .  Hypertension Sister   . Thyroid disease Sister   . Diabetes Maternal Grandmother   . Alzheimer's disease Maternal Grandmother   . Uterine cancer Maternal Grandmother   . Hypertension Maternal Grandmother   . Osteoporosis Maternal Grandmother   . Cancer Maternal Grandmother        uterine  . Lung cancer Maternal Grandfather   . Cancer Maternal Grandfather        lung  . Cancer Paternal Grandmother        metatstatic-- ? primary colon  . Cancer Paternal Grandfather        skin, hepatic  . Colon cancer Paternal Grandfather        age unknown  . Colon polyps Neg Hx   . Rectal cancer Neg Hx   . Stomach cancer Neg Hx   . Esophageal cancer Neg Hx     Social History:   Social History   Socioeconomic History  . Marital status: Married    Spouse name: Not on file  . Number of children: 2  . Years of education: 20  . Highest education level: Not on file  Occupational History  . Occupation: scheduler [employer]  Tobacco Use  . Smoking status: Former Smoker    Packs/day: 1.00    Quit date: 01/14/1995    Years since quitting: 25.9  . Smokeless tobacco: Never Used  . Tobacco comment: started smoking at age 66 1ppd  Vaping Use  . Vaping Use: Never used  Substance and Sexual Activity  . Alcohol use: Yes    Comment: Rare  . Drug use: No  . Sexual activity: Not Currently    Partners: Male    Comment: 1st intercourse 55 yo-More than 5 partners-BTL  Other Topics Concern  . Not on file  Social History Narrative   Exercise-- no   Social Determinants of Health   Financial Resource Strain: Not on file  Food Insecurity: Not on file  Transportation Needs: Not on file  Physical Activity: Not on file  Stress: Not on file  Social Connections: Not on file    Additional Social History: grew up with grand ma and aunt , mom died when patient was age 54, dad committed suicide when patient was 56. Did fair in school,   Allergies:   Allergies  Allergen Reactions  . Morphine      Nausea & vomiting  . Lisinopril     sweating    Metabolic Disorder Labs: Lab Results  Component Value Date   HGBA1C 6.1 10/19/2020   No results found for: PROLACTIN Lab Results  Component Value Date   CHOL 148 10/19/2020   TRIG 162.0 (H) 10/19/2020   HDL 49.40 10/19/2020   CHOLHDL 3 10/19/2020   VLDL 32.4 10/19/2020   LDLCALC 66 10/19/2020  [place] 64 10/10/2019   Lab Results  Component Value Date   TSH 1.59 10/19/2020    Therapeutic Level Labs: No results found for: LITHIUM No results found for: CBMZ No results found for: VALPROATE  Current Medications: Current Outpatient Medications  Medication Sig Dispense Refill  . ALPRAZolam (XANAX) 0.25 MG tablet Take 1 tablet (0.25 mg total) by mouth 2 (two) times daily as needed for anxiety. 20 tablet 0  . amLODipine (NORVASC) 5 MG tablet Take 1 tablet (5 mg total) by mouth daily. 90 tablet 1  . cetirizine (ZYRTEC) 10 MG tablet Take 1 tablet (10 mg total) by mouth daily.    Marland Kitchen desvenlafaxine (PRISTIQ) 100 MG 24 hr tablet Take 1 tablet (100 mg total) by mouth daily. 90 tablet 3  . fenofibrate 160 MG tablet Take 1 tablet (160 mg total) by mouth daily. 90 tablet 1  . lamoTRIgine (LAMICTAL) 25 MG tablet Take 1 tablet (25 mg total) by mouth daily. Take one tablet daily for a week and then start taking 2 tablets. 60 tablet 0  . levonorgestrel-ethinyl estradiol (ORSYTHIA) 0.1-20 MG-MCG tablet Take 1 tablet by mouth daily. 84 tablet 3  . levothyroxine (SYNTHROID) 137 MCG tablet Take 1 tablet (137 mcg total) by mouth daily with breakfast. 90 tablet 3  . QUEtiapine Fumarate (SEROQUEL XR) 150 MG 24 hr tablet Take 1 tablet (150 mg total) by mouth at bedtime. 30 tablet 0  . rosuvastatin (CRESTOR) 10 MG tablet Take 1 tablet (10 mg total) by mouth daily. 90 tablet 1  . zolpidem (AMBIEN) 10 MG tablet Take 1 tablet (10 mg total) by mouth at bedtime as needed for sleep. Take half for first 3 days and increase to 10mg  if needed after 30 tablet 0  .  valACYclovir (VALTREX) 1000 MG tablet Take 1 tablet (1,000 mg total) by mouth 3 (three) times daily. (Patient taking differently: Take 1,000 mg by mouth 3 (three) times daily. As needed.) 30 tablet 2   No current facility-administered medications for this visit.      Psychiatric Specialty Exam: Review of Systems  Cardiovascular: Negative for chest pain.  Psychiatric/Behavioral: Positive for agitation and sleep disturbance. Negative for self-injury and suicidal ideas.    Blood pressure (!) 153/96, pulse 88, height 5\' 3"  (1.6 m), weight 178 lb (80.7 kg).Body mass index is 31.53 kg/m.  General Appearance: Casual  Eye Contact:  Fair  Speech:  Clear and Coherent  Volume:  Normal  Mood:  Euthymic  Affect:  Full Range  Thought Process:  Goal Directed  Orientation:  Full (Time, Place, and Person)  Thought Content:  Rumination  Suicidal Thoughts:  No  Homicidal Thoughts:  No  Memory:  Immediate;   Fair Recent;   Fair  Judgement:  Fair  Insight:  Fair  Psychomotor Activity:  Normal  Concentration:  Concentration: Fair and Attention Span: Fair  Recall:  [employer] of Knowledge:Good  Language: Good  Akathisia:  No  Handed:    AIMS (if indicated):  not done  Assets:  Desire for Improvement Financial Resources/Insurance Physical Health Social Support  ADL's:  Intact  Cognition: WNL  Sleep:  Poor   Screenings: Pharmacist, [facility] [place] Office Visit from 12/13/2020 in [place] Office Visit from 10/19/2020 in [facility] [place] Visit from 03/19/2017 in [facility] [place] Visit from 06/22/2015 in Primary Care at [place] from 05/01/2015 in Primary  Care at [facility] Total Score 0 0 0 0 0    [place] Office Visit from 12/13/2020 in [place] No Risk      Assessment and Plan: as  follows  Mood disorder not otherwise specified; she does have mood swings or gets agitated her mind races.  We will consider mood stabilizer like Lamictal 25 mg increase to 50 mg in 1 week.  Mind racing can be contributing to the insomnia as well discussed sleep hygiene  Insomnia; reviewed sleep hygiene take warm bath and evening will start Ambien 5 mg increase to 10 mg in 3 to 4 days.  Do not take Xanax at night along with Ambien  Should consider sleep study but she is reluctant  Denies anxiety disorder; continue Pristiq it has helped some  Her mind still races at night we will consider a mood stabilizer as above and Ambien discussed and reviewed medications  Consider therapy if needed to deal with stress and feeling her job and there is family history of depression, distant relationship with one of her son and husband Patient is not suicidal does not endorse depressive symptoms  Follow-up in 3 to 4 weeks or earlier if needed Total time spent including documentation and face to face time: 1 hour Merian Capron, MD 4/21/202212:01 PM

## 2020-12-30 ENCOUNTER — Encounter: Payer: Self-pay | Admitting: Family Medicine

## 2020-12-30 DIAGNOSIS — E785 Hyperlipidemia, unspecified: Secondary | ICD-10-CM

## 2020-12-31 MED ORDER — ROSUVASTATIN CALCIUM 10 MG PO TABS: 10.0000 mg | ORAL_TABLET | Freq: Every day | ORAL | 1 refills | Status: DC

## 2021-01-07 ENCOUNTER — Other Ambulatory Visit (HOSPITAL_COMMUNITY): Payer: Self-pay | Admitting: Psychiatry

## 2021-01-11 ENCOUNTER — Telehealth (HOSPITAL_COMMUNITY): Payer: Self-pay | Admitting: Psychiatry

## 2021-01-11 MED ORDER — ZOLPIDEM TARTRATE 10 MG PO TABS: 10.0000 mg | ORAL_TABLET | Freq: Every evening | ORAL | 0 refills | Status: DC | PRN

## 2021-01-11 NOTE — Telephone Encounter (Signed)
sent 

## 2021-01-11 NOTE — Telephone Encounter (Signed)
Pt calling  Needs refills on ambien  [facility]  She only has 2 pills left and her apt is Tuesday

## 2021-01-15 ENCOUNTER — Ambulatory Visit (HOSPITAL_COMMUNITY): Payer: BC Managed Care – PPO | Admitting: Psychiatry

## 2021-01-15 DIAGNOSIS — Z20822 Contact with and (suspected) exposure to covid-19: Secondary | ICD-10-CM | POA: Diagnosis not present

## 2021-01-17 ENCOUNTER — Ambulatory Visit: Payer: BC Managed Care – PPO | Admitting: Family Medicine

## 2021-01-22 ENCOUNTER — Other Ambulatory Visit: Payer: BC Managed Care – PPO

## 2021-01-24 ENCOUNTER — Other Ambulatory Visit: Payer: Self-pay

## 2021-01-24 ENCOUNTER — Other Ambulatory Visit (INDEPENDENT_AMBULATORY_CARE_PROVIDER_SITE_OTHER): Payer: BC Managed Care – PPO

## 2021-01-24 DIAGNOSIS — E039 Hypothyroidism, unspecified: Secondary | ICD-10-CM

## 2021-01-24 DIAGNOSIS — E1100 Type 2 diabetes mellitus with hyperosmolarity without nonketotic hyperglycemic-hyperosmolar coma (NKHHC): Secondary | ICD-10-CM | POA: Diagnosis not present

## 2021-01-24 DIAGNOSIS — E785 Hyperlipidemia, unspecified: Secondary | ICD-10-CM

## 2021-01-24 DIAGNOSIS — E1165 Type 2 diabetes mellitus with hyperglycemia: Secondary | ICD-10-CM | POA: Diagnosis not present

## 2021-01-24 LAB — HEMOGLOBIN A1C: Hgb A1c MFr Bld: 6.5 % (ref 4.6–6.5)

## 2021-01-24 LAB — TSH: TSH: 11.62 u[IU]/mL — ABNORMAL HIGH (ref 0.35–4.50)

## 2021-01-24 NOTE — Progress Notes (Signed)
Patient has appointment today for lab. No orders  Were placed. I have read last note and placed lab orders as future for today.

## 2021-01-25 LAB — LIPID PANEL
Cholesterol: 149 mg/dL (ref 0–200)
HDL: 52 mg/dL (ref >39.00)
LDL Cholesterol: 70 mg/dL (ref 0–99)
NonHDL: 96.85
Total CHOL/HDL Ratio: 3
Triglycerides: 135 mg/dL (ref 0.0–149.0)
VLDL: 27 mg/dL (ref 0.0–40.0)

## 2021-01-28 ENCOUNTER — Encounter: Payer: Self-pay | Admitting: Family Medicine

## 2021-01-28 ENCOUNTER — Other Ambulatory Visit: Payer: Self-pay | Admitting: Family Medicine

## 2021-01-28 DIAGNOSIS — E039 Hypothyroidism, unspecified: Secondary | ICD-10-CM

## 2021-01-28 NOTE — Telephone Encounter (Signed)
See labs 

## 2021-01-29 ENCOUNTER — Other Ambulatory Visit: Payer: Self-pay | Admitting: Family Medicine

## 2021-01-29 ENCOUNTER — Other Ambulatory Visit: Payer: Self-pay

## 2021-01-29 DIAGNOSIS — E1165 Type 2 diabetes mellitus with hyperglycemia: Secondary | ICD-10-CM

## 2021-01-29 DIAGNOSIS — R7303 Prediabetes: Secondary | ICD-10-CM

## 2021-01-29 MED ORDER — LEVOTHYROXINE SODIUM 150 MCG PO TABS: 150.0000 ug | ORAL_TABLET | Freq: Every day | ORAL | 1 refills | Status: DC

## 2021-01-31 ENCOUNTER — Ambulatory Visit (INDEPENDENT_AMBULATORY_CARE_PROVIDER_SITE_OTHER): Payer: BC Managed Care – PPO | Admitting: Psychiatry

## 2021-01-31 ENCOUNTER — Other Ambulatory Visit: Payer: Self-pay

## 2021-01-31 ENCOUNTER — Encounter (HOSPITAL_COMMUNITY): Payer: Self-pay | Admitting: Psychiatry

## 2021-01-31 VITALS — BP 132/82 | HR 96 | Temp 97.9°F | Ht 62.75 in | Wt 180.0 lb

## 2021-01-31 DIAGNOSIS — F0630 Mood disorder due to known physiological condition, unspecified: Secondary | ICD-10-CM | POA: Diagnosis not present

## 2021-01-31 DIAGNOSIS — F411 Generalized anxiety disorder: Secondary | ICD-10-CM | POA: Diagnosis not present

## 2021-01-31 DIAGNOSIS — F5102 Adjustment insomnia: Secondary | ICD-10-CM | POA: Diagnosis not present

## 2021-01-31 MED ORDER — ZOLPIDEM TARTRATE 10 MG PO TABS: 10.0000 mg | ORAL_TABLET | Freq: Every evening | ORAL | 0 refills | Status: DC | PRN

## 2021-01-31 NOTE — Progress Notes (Signed)
[facility] Follow up visit  Patient Identification: Katie [name] MRN:  [id number] Date of Evaluation:  01/31/2021 Referral Source: primary care Chief Complaint:  follow up insomnia   Visit Diagnosis:    ICD-10-CM   1. Adjustment insomnia  F51.02     2. Mood disorder in conditions classified elsewhere  F06.30     3. 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 Has been on seroquel, belsorma  before  Last visit we started [place] and considered lammictal for racy thoughts  She didn't start lamictal but has improved with ambien 10mg  More better nights and having 5 hours plus sleep   In regarding depression she denies symptoms.   Aggravating factors; job stress.  Distant relationship with one of her son. Modifying factors sister,, camping  Duration since young age   She was seen at age 79 by her counselor after her father's death 1 time no medication given       Past Psychiatric History: insomnia, anxiety  Previous Psychotropic Medications: Yes  Zoloft, seroquel   Past Medical History:  Past Medical History:  Diagnosis Date   Allergy    Anemia    Anxiety    Blood transfusion without reported diagnosis    Hyperlipidemia    Hypertension    Migraine    PVC (premature ventricular contraction)    RLS (restless legs syndrome)    Thyroid disease     Past Surgical History:  Procedure Laterality Date   BUNIONECTOMY  06/04/2009   Right foot   CARPAL TUNNEL RELEASE     Bilateral   CRYOABLATION  11/04   [place]   ENDOMETRIAL ABLATION      SEPTOPLASTY  6/05   TUBAL LIGATION  04/1999   UPPER GASTROINTESTINAL ENDOSCOPY      Family Psychiatric History: Dad: committed suicide, see chart , Niece; depression  Family History:  Family History  Problem Relation Age of Onset   Depression Father        Suicide   Aneurysm Mother        Brain   Hypertension Sister    Thyroid disease Sister    Diabetes Maternal Grandmother    Alzheimer's disease Maternal Grandmother    Uterine cancer Maternal Grandmother    Hypertension Maternal Grandmother    Osteoporosis Maternal Grandmother    Cancer Maternal Grandmother        uterine   Lung cancer Maternal Grandfather    Cancer Maternal Grandfather        lung   Cancer Paternal Grandmother        metatstatic-- ? primary colon   Cancer Paternal Grandfather        skin, hepatic   Colon cancer Paternal Grandfather        age unknown   Colon polyps Neg Hx    Rectal cancer Neg Hx    Stomach cancer Neg Hx    Esophageal cancer Neg Hx     Social History:   Social History   Socioeconomic History   Marital status: Married    Spouse name:  Not on file   Number of children: 2   Years of education: 15   Highest education level: Not on file  Occupational History   Occupation: scheduler [employer]  Tobacco Use   Smoking status: Former    Packs/day: 1.00    Pack years: 0.00    Types: Cigarettes    Quit date: 01/14/1995    Years since quitting: 26.0   Smokeless tobacco: Never   Tobacco comments:    started smoking at age 57 1ppd  Vaping Use   Vaping Use: Never used  Substance and Sexual Activity   Alcohol use: Yes    Alcohol/week: 2.0 standard drinks    Types: 2 Cans of beer per week    Comment: Reports a couple of beers 1- 2 times a month   Drug use: No   Sexual activity: Not Currently    Partners: Male    Comment: 1st intercourse 55 yo-More than 5 partners-BTL  Other Topics Concern   Not on file  Social History Narrative   Exercise-- no   Social Determinants of  Health   Financial Resource Strain: Not on file  Food Insecurity: Not on file  Transportation Needs: Not on file  Physical Activity: Not on file  Stress: Not on file  Social Connections: Not on file     Allergies:   Allergies  Allergen Reactions   Morphine     Nausea & vomiting   Lisinopril     sweating    Metabolic Disorder Labs: Lab Results  Component Value Date   HGBA1C 6.5 01/24/2021   No results found for: PROLACTIN Lab Results  Component Value Date   CHOL 149 01/24/2021   TRIG 135.0 01/24/2021   HDL 52.00 01/24/2021   CHOLHDL 3 01/24/2021   VLDL 27.0 01/24/2021   LDLCALC 70 01/24/2021   LDLCALC 66 10/19/2020   Lab Results  Component Value Date   TSH 11.62 (H) 01/24/2021    Therapeutic Level Labs: No results found for: LITHIUM No results found for: CBMZ No results found for: VALPROATE  Current Medications: Current Outpatient Medications  Medication Sig Dispense Refill   ALPRAZolam (XANAX) 0.25 MG tablet Take 1 tablet (0.25 mg total) by mouth 2 (two) times daily as needed for anxiety. 20 tablet 0   amLODipine (NORVASC) 5 MG tablet Take 1 tablet (5 mg total) by mouth daily. 90 tablet 1   cetirizine (ZYRTEC) 10 MG tablet Take 1 tablet (10 mg total) by mouth daily.     desvenlafaxine (PRISTIQ) 100 MG 24 hr tablet Take 1 tablet (100 mg total) by mouth daily. 90 tablet 3   fenofibrate 160 MG tablet Take 1 tablet (160 mg total) by mouth daily. 90 tablet 1   levonorgestrel-ethinyl estradiol (ORSYTHIA) 0.1-20 MG-MCG tablet Take 1 tablet by mouth daily. 84 tablet 3   levothyroxine (SYNTHROID) 150 MCG tablet Take 1 tablet (150 mcg total) by mouth daily. 90 tablet 1   rosuvastatin (CRESTOR) 10 MG tablet Take 1 tablet (10 mg total) by mouth daily. 90 tablet 1   valACYclovir (VALTREX) 1000 MG tablet Take 1 tablet (1,000 mg total) by mouth 3 (three) times daily. (Patient taking differently: Take 1,000 mg by mouth 3 (three) times daily. As needed.) 30 tablet 2    zolpidem (AMBIEN) 10 MG tablet Take 1 tablet (10 mg total) by mouth at bedtime as needed for sleep. TAKE ONE AT NIGHT FOR SLEEP 30 tablet 0   No current facility-administered medications for this visit.  Psychiatric Specialty Exam: Review of Systems  Cardiovascular:  Negative for chest pain.  Psychiatric/Behavioral:  Negative for self-injury and suicidal ideas.    Blood pressure 132/82, pulse 96, temperature 97.9 F (36.6 C), height 5' 2.75" (1.594 m), weight 180 lb (81.6 kg), SpO2 98 %.Body mass index is 32.14 kg/m.  General Appearance: Casual  Eye Contact:  Fair  Speech:  Clear and Coherent  Volume:  Normal  Mood:  Euthymic  Affect:  Full Range  Thought Process:  Goal Directed  Orientation:  Full (Time, Place, and Person)  Thought Content:  Rumination  Suicidal Thoughts:  No  Homicidal Thoughts:  No  Memory:  Immediate;   Fair Recent;   Fair  Judgement:  Fair  Insight:  Fair  Psychomotor Activity:  Normal  Concentration:  Concentration: Fair and Attention Span: Fair  Recall:  [employer] of Knowledge:Good  Language: Good  Akathisia:  No  Handed:    AIMS (if indicated):  not done  Assets:  Desire for Improvement Financial Resources/Insurance Physical Health Social Support  ADL's:  Intact  Cognition: WNL  Sleep:  Poor   Screenings: PHQ2-9    Flow[place] Row Office Visit from 12/13/2020 in [place] Office Visit from 10/19/2020 in [place] at [place] Visit from 03/19/2017 in [place] at [place] Visit from 06/22/2015 in Primary Care at [place] from 05/01/2015 in Primary Care at [facility] Total Score 0 0 0 0 [place] Office Visit from 01/31/2021 in [place] Office Visit from 12/13/2020 in [place] No Risk No Risk        Assessment and Plan: as follows  Prior documentation reviewed Mood disorder not otherwise specified; s some better since sleep is better she did not start Lamictal states she wants to be on less medication the better  Insomnia; improved on Ambien now she seldom takes Xanax continue Ambien 10 mg and work on sleep hygiene Reviewed sleep apnea concerns but it was negative last visit with a sleep specialist was 11 years ago  Should consider sleep study but as of now her sleep has improved  Generalized anxiety disorder; continue Pristiq Face to face time spent 20 min with documentation Follow-up in 3 months or earlier if needed call for Ambien refills Merian Capron, MD 6/9/20223:50 PM

## 2021-02-11 ENCOUNTER — Telehealth (HOSPITAL_COMMUNITY): Payer: Self-pay | Admitting: Psychiatry

## 2021-02-11 MED ORDER — ZOLPIDEM TARTRATE 10 MG PO TABS: 10.0000 mg | ORAL_TABLET | Freq: Every evening | ORAL | 0 refills | Status: DC | PRN

## 2021-02-11 NOTE — Telephone Encounter (Signed)
Per pt Needs refill on [facility]

## 2021-02-12 ENCOUNTER — Encounter: Payer: Self-pay | Admitting: Family Medicine

## 2021-02-12 DIAGNOSIS — F419 Anxiety disorder, unspecified: Secondary | ICD-10-CM

## 2021-02-12 NOTE — Telephone Encounter (Signed)
Requesting: alprazolam 0.25mg   Contract: None UDS: 10/10/2019 Last Visit: 10/19/2020 Next Visit: None Last Refill: 11/16/2020 #20 and 0RF  Please Advise

## 2021-02-13 MED ORDER — ALPRAZOLAM 0.25 MG PO TABS: 0.2500 mg | ORAL_TABLET | Freq: Two times a day (BID) | ORAL | 0 refills | Status: DC | PRN

## 2021-03-07 ENCOUNTER — Telehealth: Payer: Self-pay

## 2021-03-07 ENCOUNTER — Other Ambulatory Visit: Payer: Self-pay | Admitting: Family Medicine

## 2021-03-07 DIAGNOSIS — E039 Hypothyroidism, unspecified: Secondary | ICD-10-CM

## 2021-03-07 NOTE — Telephone Encounter (Signed)
Patient called stating she has AEX scheduled on 04/25/21 and "I need refill my medication".  I called patient back to find out what medication she needed but her voice mail box if full and I cannot leave a message.

## 2021-03-11 ENCOUNTER — Encounter: Payer: Self-pay | Admitting: Family Medicine

## 2021-03-11 ENCOUNTER — Telehealth (HOSPITAL_COMMUNITY): Payer: Self-pay

## 2021-03-11 DIAGNOSIS — F32A Depression, unspecified: Secondary | ICD-10-CM

## 2021-03-11 MED ORDER — ZOLPIDEM TARTRATE 10 MG PO TABS: 10.0000 mg | ORAL_TABLET | Freq: Every evening | ORAL | 0 refills | Status: DC | PRN

## 2021-03-11 NOTE — Telephone Encounter (Signed)
Patient states she needs a refill on Ambien sent to [facility]

## 2021-03-12 MED ORDER — DESVENLAFAXINE SUCCINATE ER 100 MG PO TB24: 100.0000 mg | ORAL_TABLET | Freq: Every day | ORAL | 1 refills | Status: DC

## 2021-03-15 ENCOUNTER — Other Ambulatory Visit: Payer: Self-pay | Admitting: Nurse Practitioner

## 2021-03-15 DIAGNOSIS — Z3041 Encounter for surveillance of contraceptive pills: Secondary | ICD-10-CM

## 2021-03-15 NOTE — Telephone Encounter (Signed)
Annual exam scheduled on 04/25/21

## 2021-03-26 ENCOUNTER — Encounter: Payer: Self-pay | Admitting: Family Medicine

## 2021-03-26 DIAGNOSIS — F419 Anxiety disorder, unspecified: Secondary | ICD-10-CM

## 2021-03-27 MED ORDER — ALPRAZOLAM 0.25 MG PO TABS
0.2500 mg | ORAL_TABLET | Freq: Two times a day (BID) | ORAL | 0 refills | Status: DC | PRN
Start: 2021-03-27 — End: 2021-05-30

## 2021-03-27 NOTE — Telephone Encounter (Signed)
Requesting: Xanax Contract: 2021 UDS: 2021 Last OV: 10/19/20 Next OV: N/A Last Refill: 02/13/21, #20--0 RF Database:   Please advise

## 2021-04-04 ENCOUNTER — Ambulatory Visit: Payer: BC Managed Care – PPO | Admitting: Family Medicine

## 2021-04-04 ENCOUNTER — Other Ambulatory Visit: Payer: Self-pay | Admitting: Family Medicine

## 2021-04-04 NOTE — Progress Notes (Incomplete)
Subjective:   By signing my name below, I, [name], attest that this documentation has been prepared under the direction and in the presence of Dr. Roma Schanz, DO. 04/04/2021    Patient ID: Katie [name], female    DOB: [birth year], 55 y.o.   MRN: [id number]  No chief complaint on file.   HPI Patient is in today for a office visit. She complains of   Past Medical History:  Diagnosis Date   Allergy    Anemia    Anxiety    Blood transfusion without reported diagnosis    Hyperlipidemia    Hypertension    Migraine    PVC (premature ventricular contraction)    RLS (restless legs syndrome)    Thyroid disease     Past Surgical History:  Procedure Laterality Date   BUNIONECTOMY  06/04/2009   Right foot   CARPAL TUNNEL RELEASE     Bilateral   CRYOABLATION  11/04   [place]   ENDOMETRIAL ABLATION     SEPTOPLASTY  6/05   TUBAL LIGATION  04/1999   UPPER GASTROINTESTINAL ENDOSCOPY      Family History  Problem Relation Age of Onset   Depression Father        Suicide   Aneurysm Mother        Brain   Hypertension Sister    Thyroid disease Sister    Diabetes Maternal Grandmother    Alzheimer's disease Maternal Grandmother    Uterine cancer Maternal Grandmother    Hypertension Maternal Grandmother    Osteoporosis Maternal Grandmother    Cancer Maternal Grandmother        uterine   Lung cancer Maternal Grandfather    Cancer Maternal Grandfather        lung   Cancer Paternal Grandmother        metatstatic-- ? primary colon   Cancer Paternal Grandfather        skin, hepatic   Colon cancer Paternal Grandfather        age unknown   Colon polyps Neg Hx    Rectal cancer Neg Hx    Stomach cancer Neg Hx    Esophageal cancer Neg Hx     Social History   Socioeconomic History   Marital status: Married    Spouse name: Not on file   Number of children: 2   Years of education: 15   Highest education level: Not on file  Occupational  History   Occupation: scheduler [employer]  Tobacco Use   Smoking status: Former    Packs/day: 1.00    Types: Cigarettes    Quit date: 01/14/1995    Years since quitting: 26.2   Smokeless tobacco: Never   Tobacco comments:    started smoking at age 86 1ppd  Vaping Use   Vaping Use: Never used  Substance and Sexual Activity   Alcohol use: Yes    Alcohol/week: 2.0 standard drinks    Types: 2 Cans of beer per week    Comment: Reports a couple of beers 1- 2 times a month   Drug use: No   Sexual activity: Not Currently    Partners: Male    Comment: 1st intercourse 55 yo-More than 5 partners-BTL  Other Topics Concern   Not on file  Social History Narrative   Exercise-- no   Social Determinants of Health   Financial Resource Strain: Not on file  Food Insecurity: Not on file  Transportation Needs: Not on file  Physical Activity: Not on file  Stress: Not on file  Social Connections: Not on file  Intimate Partner Violence: Not on file    Outpatient Medications Prior to Visit  Medication Sig Dispense Refill   ALPRAZolam (XANAX) 0.25 MG tablet Take 1 tablet (0.25 mg total) by mouth 2 (two) times daily as needed for anxiety. 20 tablet 0   amLODipine (NORVASC) 5 MG tablet Take 1 tablet (5 mg total) by mouth daily. 90 tablet 1   cetirizine (ZYRTEC) 10 MG tablet Take 1 tablet (10 mg total) by mouth daily.     desvenlafaxine (PRISTIQ) 100 MG 24 hr tablet Take 1 tablet (100 mg total) by mouth daily. 90 tablet 1   fenofibrate 160 MG tablet Take 1 tablet (160 mg total) by mouth daily. 90 tablet 1   levonorgestrel-ethinyl estradiol (AVIANE) 0.1-20 MG-MCG tablet TAKE 1 TABLET BY MOUTH DAILY 84 tablet 0   levothyroxine (SYNTHROID) 150 MCG tablet Take 1 tablet (150 mcg total) by mouth daily. 90 tablet 1   rosuvastatin (CRESTOR) 10 MG tablet Take 1 tablet (10 mg total) by mouth daily. 90 tablet 1   valACYclovir (VALTREX) 1000 MG tablet Take 1 tablet (1,000 mg total) by mouth 3 (three) times  daily. (Patient taking differently: Take 1,000 mg by mouth 3 (three) times daily. As needed.) 30 tablet 2   zolpidem (AMBIEN) 10 MG tablet Take 1 tablet (10 mg total) by mouth at bedtime as needed for sleep. TAKE ONE AT NIGHT FOR SLEEP 30 tablet 0   No facility-administered medications prior to visit.    Allergies  Allergen Reactions   Morphine     Nausea & vomiting   Lisinopril     sweating    ROS     Objective:    Physical Exam Constitutional:      General: She is not in acute distress.    Appearance: Normal appearance. She is not ill-appearing.  HENT:     Head: Normocephalic and atraumatic.     Right Ear: External ear normal.     Left Ear: External ear normal.  Eyes:     Extraocular Movements: Extraocular movements intact.     Pupils: Pupils are equal, round, and reactive to light.  Cardiovascular:     Rate and Rhythm: Normal rate and regular rhythm.     Heart sounds: Normal heart sounds. No murmur heard.   No gallop.  Pulmonary:     Effort: Pulmonary effort is normal. No respiratory distress.     Breath sounds: Normal breath sounds. No wheezing or rales.  Skin:    General: Skin is warm and dry.  Neurological:     Mental Status: She is alert and oriented to person, place, and time.  Psychiatric:        Behavior: Behavior normal.        Judgment: Judgment normal.    There were no vitals taken for this visit. Wt Readings from Last 3 Encounters:  10/19/20 185 lb 3.2 oz (84 kg)  09/12/20 186 lb (84.4 kg)  05/16/20 183 lb (83 kg)    Diabetic Foot Exam - Simple   No data filed    Lab Results  Component Value Date   WBC 4.2 04/09/2020   HGB 11.9 (L) 04/09/2020   HCT 36.4 04/09/2020   PLT 342.0 04/09/2020   GLUCOSE 94 10/19/2020   CHOL 149 01/24/2021   TRIG 135.0 01/24/2021   HDL 52.00 01/24/2021   LDLDIRECT 62.0 04/09/2020   LDLCALC 70 01/24/2021  ALT 12 10/19/2020   AST 15 10/19/2020   NA 136 10/19/2020   K 4.6 10/19/2020   CL 103 10/19/2020    CREATININE 0.79 10/19/2020   BUN 13 10/19/2020   CO2 29 10/19/2020   TSH 11.62 (H) 01/24/2021   HGBA1C 6.5 01/24/2021   MICROALBUR 1.2 10/19/2020    Lab Results  Component Value Date   TSH 11.62 (H) 01/24/2021   Lab Results  Component Value Date   WBC 4.2 04/09/2020   HGB 11.9 (L) 04/09/2020   HCT 36.4 04/09/2020   MCV 80.4 04/09/2020   PLT 342.0 04/09/2020   Lab Results  Component Value Date   NA 136 10/19/2020   K 4.6 10/19/2020   CO2 29 10/19/2020   GLUCOSE 94 10/19/2020   BUN 13 10/19/2020   CREATININE 0.79 10/19/2020   BILITOT 0.4 10/19/2020   ALKPHOS 41 10/19/2020   AST 15 10/19/2020   ALT 12 10/19/2020   PROT 7.3 10/19/2020   ALBUMIN 4.2 10/19/2020   CALCIUM 9.6 10/19/2020   GFR 85.96 10/19/2020   Lab Results  Component Value Date   CHOL 149 01/24/2021   Lab Results  Component Value Date   HDL 52.00 01/24/2021   Lab Results  Component Value Date   LDLCALC 70 01/24/2021   Lab Results  Component Value Date   TRIG 135.0 01/24/2021   Lab Results  Component Value Date   CHOLHDL 3 01/24/2021   Lab Results  Component Value Date   HGBA1C 6.5 01/24/2021       Assessment & Plan:   Problem List Items Addressed This Visit   None    No orders of the defined types were placed in this encounter.   I, Dr. Roma Schanz, DO, personally preformed the services described in this documentation.  All medical record entries made by the scribe were at my direction and in my presence.  I have reviewed the chart and discharge instructions (if applicable) and agree that the record reflects my personal performance and is accurate and complete. 04/04/2021   I,[name],acting as a Education administrator for [employer], DO.,have documented all relevant documentation on the behalf of [name], DO,as directed by  [name], DO while in the presence of [name], DO.   Shehryar [employer]

## 2021-04-05 ENCOUNTER — Ambulatory Visit (HOSPITAL_BASED_OUTPATIENT_CLINIC_OR_DEPARTMENT_OTHER)
Admission: RE | Admit: 2021-04-05 | Discharge: 2021-04-05 | Disposition: A | Discharge status: Home / Self Care | Payer: BC Managed Care – PPO | Source: Ambulatory Visit | Attending: Family Medicine | Admitting: Family Medicine

## 2021-04-05 ENCOUNTER — Other Ambulatory Visit: Payer: Self-pay | Admitting: Family Medicine

## 2021-04-05 ENCOUNTER — Encounter: Payer: Self-pay | Admitting: Family Medicine

## 2021-04-05 ENCOUNTER — Ambulatory Visit: Payer: BC Managed Care – PPO | Admitting: Family Medicine

## 2021-04-05 ENCOUNTER — Other Ambulatory Visit: Payer: Self-pay

## 2021-04-05 VITALS — BP 124/68 | HR 87 | Temp 98.8°F | Resp 18 | Ht 62.75 in | Wt 178.6 lb

## 2021-04-05 DIAGNOSIS — E039 Hypothyroidism, unspecified: Secondary | ICD-10-CM | POA: Diagnosis not present

## 2021-04-05 DIAGNOSIS — M25551 Pain in right hip: Secondary | ICD-10-CM | POA: Insufficient documentation

## 2021-04-05 MED ORDER — PREDNISONE 10 MG PO TABS
ORAL_TABLET | ORAL | 0 refills | Status: DC
Start: 2021-04-05 — End: 2021-04-25

## 2021-04-05 NOTE — Progress Notes (Signed)
Subjective:   By signing my name below, I, Zite Okoli, attest that this documentation has been prepared under the direction and in the presence of [name], DO. 04/05/2021   Patient ID: Katie [name], female    DOB: 10/11/1965, 55 y.o.   MRN: [id number]  Chief Complaint  Patient presents with   Hip Problem    Pt states both hips but more the right. Pt states feeling a pop on the right side with walking and states having a shooting pain.     HPI Patient is in today for an office visit to discuss her hip pains.  She mentions that she normally experiences hip pain which is connected to her thyroid levels. In the last week, she has felt more pain in her right hip and sometimes hears a "popping" or "clicking" sound. She is also experiencing back pain. She normally uses Advil to manage the pain but wanted to recheck her thyroid levels again.  She had Covid-19 in May and reports that even though she did not lose her sense of smell and taste, she still smells weird things occasionally.   She also mentions that she does not drink a lot of water and often has to be reminded.  Past Medical History:  Diagnosis Date   Allergy    Anemia    Anxiety    Blood transfusion without reported diagnosis    Hyperlipidemia    Hypertension    Migraine    PVC (premature ventricular contraction)    RLS (restless legs syndrome)    Thyroid disease     Past Surgical History:  Procedure Laterality Date   BUNIONECTOMY  06/04/2009   Right foot   CARPAL TUNNEL RELEASE     Bilateral   CRYOABLATION  11/04   [place]   ENDOMETRIAL ABLATION     SEPTOPLASTY  6/05   TUBAL LIGATION  04/1999   UPPER GASTROINTESTINAL ENDOSCOPY      Family History  Problem Relation Age of Onset   Depression Father        Suicide   Aneurysm Mother        Brain   Hypertension Sister    Thyroid disease Sister    Diabetes Maternal Grandmother    Alzheimer's disease Maternal Grandmother     Uterine cancer Maternal Grandmother    Hypertension Maternal Grandmother    Osteoporosis Maternal Grandmother    Cancer Maternal Grandmother        uterine   Lung cancer Maternal Grandfather    Cancer Maternal Grandfather        lung   Cancer Paternal Grandmother        metatstatic-- ? 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[employer]  Tobacco Use   Smoking status: Former    Packs/day: 1.00    Types: Cigarettes    Quit date: 01/14/1995  Years since quitting: 26.2   Smokeless tobacco: Never   Tobacco comments:    started smoking at age 28 1ppd  Vaping Use   Vaping Use: Never used  Substance and Sexual Activity   Alcohol use: Yes    Alcohol/week: 2.0 standard drinks    Types: 2 Cans of beer per week    Comment: Reports a couple of beers 1- 2 times a month   Drug use: No   Sexual activity: Not Currently    Partners: Male    Comment: 1st intercourse 55 yo-More than 5 partners-BTL  Other Topics Concern   Not on file  Social History Narrative   Exercise-- no   Social Determinants of Health   Financial Resource Strain: Not on file  Food Insecurity: Not on file  Transportation Needs: Not on file  Physical Activity: Not on file  Stress: Not on file  Social Connections: Not on file  Intimate Partner Violence: Not on file    Outpatient Medications Prior to Visit  Medication Sig Dispense Refill   ALPRAZolam (XANAX) 0.25 MG tablet Take 1 tablet (0.25 mg total) by mouth 2 (two) times daily as needed for anxiety. 20 tablet 0   amLODipine (NORVASC) 5 MG  tablet Take 1 tablet (5 mg total) by mouth daily. 90 tablet 1   cetirizine (ZYRTEC) 10 MG tablet Take 1 tablet (10 mg total) by mouth daily.     desvenlafaxine (PRISTIQ) 100 MG 24 hr tablet Take 1 tablet (100 mg total) by mouth daily. 90 tablet 1   fenofibrate 160 MG tablet Take 1 tablet (160 mg total) by mouth daily. 90 tablet 1   levonorgestrel-ethinyl estradiol (AVIANE) 0.1-20 MG-MCG tablet TAKE 1 TABLET BY MOUTH DAILY 84 tablet 0   levothyroxine (SYNTHROID) 150 MCG tablet Take 1 tablet (150 mcg total) by mouth daily. 90 tablet 1   rosuvastatin (CRESTOR) 10 MG tablet Take 1 tablet (10 mg total) by mouth daily. 90 tablet 1   valACYclovir (VALTREX) 1000 MG tablet Take 1 tablet (1,000 mg total) by mouth 3 (three) times daily. (Patient taking differently: Take 1,000 mg by mouth 3 (three) times daily. As needed.) 30 tablet 2   zolpidem (AMBIEN) 10 MG tablet Take 1 tablet (10 mg total) by mouth at bedtime as needed for sleep. TAKE ONE AT NIGHT FOR SLEEP 30 tablet 0   No facility-administered medications prior to visit.    Allergies  Allergen Reactions   Morphine     Nausea & vomiting   Lisinopril     sweating    Review of Systems  Constitutional:  Negative for chills, fever and malaise/fatigue.  HENT:  Negative for congestion and hearing loss.   Eyes:  Negative for discharge.  Respiratory:  Negative for cough, sputum production and shortness of breath.   Cardiovascular:  Negative for chest pain, palpitations and leg swelling.  Gastrointestinal:  Negative for abdominal pain, blood in stool, constipation, diarrhea, heartburn, nausea and vomiting.  Genitourinary:  Negative for dysuria, frequency, hematuria and urgency.  Musculoskeletal:  Positive for back pain and joint pain (right hip). Negative for falls and myalgias.  Skin:  Negative for rash.  Neurological:  Negative for dizziness, sensory change, loss of consciousness, weakness and headaches.  Endo/Heme/Allergies:  Negative for  environmental allergies. Does not bruise/bleed easily.  Psychiatric/Behavioral:  Negative for depression and suicidal ideas. The patient is not nervous/anxious and does not have insomnia.       Objective:    Physical Exam Constitutional:  General: She is not in acute distress.    Appearance: Normal appearance. She is not ill-appearing.  HENT:     Head: Normocephalic and atraumatic.     Right Ear: External ear normal.     Left Ear: External ear normal.  Eyes:     Extraocular Movements: Extraocular movements intact.     Pupils: Pupils are equal, round, and reactive to light.  Cardiovascular:     Rate and Rhythm: Normal rate and regular rhythm.     Pulses: Normal pulses.     Heart sounds: Normal heart sounds. No murmur heard.   No gallop.  Pulmonary:     Effort: Pulmonary effort is normal. No respiratory distress.     Breath sounds: Normal breath sounds. No wheezing, rhonchi or rales.  Abdominal:     General: Bowel sounds are normal. There is no distension.     Palpations: Abdomen is soft. There is no mass.     Tenderness: There is no abdominal tenderness. There is no guarding or rebound.     Hernia: No hernia is present.  Musculoskeletal:        General: Tenderness present.     Cervical back: Normal range of motion and neck supple.     Right hip: Tenderness present.     Comments: Full rom  Pain R hip with palpation    Lymphadenopathy:     Cervical: No cervical adenopathy.  Skin:    General: Skin is warm and dry.  Neurological:     Mental Status: She is alert and oriented to person, place, and time.  Psychiatric:        Behavior: Behavior normal.    BP 124/68 (BP Location: Right Arm, Patient Position: Sitting, Cuff Size: Normal)   Pulse 87   Temp 98.8 F (37.1 C) (Oral)   Resp 18   Ht 5' 2.75" (1.594 m)   Wt 178 lb 9.6 oz (81 kg)   SpO2 98%   BMI 31.89 kg/m  Wt Readings from Last 3 Encounters:  04/05/21 178 lb 9.6 oz (81 kg)  10/19/20 185 lb 3.2 oz (84 kg)   09/12/20 186 lb (84.4 kg)    Diabetic Foot Exam - Simple   No data filed    Lab Results  Component Value Date   WBC 4.2 04/09/2020   HGB 11.9 (L) 04/09/2020   HCT 36.4 04/09/2020   PLT 342.0 04/09/2020   GLUCOSE 94 10/19/2020   CHOL 149 01/24/2021   TRIG 135.0 01/24/2021   HDL 52.00 01/24/2021   LDLDIRECT 62.0 04/09/2020   LDLCALC 70 01/24/2021   ALT 12 10/19/2020   AST 15 10/19/2020   NA 136 10/19/2020   K 4.6 10/19/2020   CL 103 10/19/2020   CREATININE 0.79 10/19/2020   BUN 13 10/19/2020   CO2 29 10/19/2020   TSH 11.62 (H) 01/24/2021   HGBA1C 6.5 01/24/2021   MICROALBUR 1.2 10/19/2020    Lab Results  Component Value Date   TSH 11.62 (H) 01/24/2021   Lab Results  Component Value Date   WBC 4.2 04/09/2020   HGB 11.9 (L) 04/09/2020   HCT 36.4 04/09/2020   MCV 80.4 04/09/2020   PLT 342.0 04/09/2020   Lab Results  Component Value Date   NA 136 10/19/2020   K 4.6 10/19/2020   CO2 29 10/19/2020   GLUCOSE 94 10/19/2020   BUN 13 10/19/2020   CREATININE 0.79 10/19/2020   BILITOT 0.4 10/19/2020   ALKPHOS 41 10/19/2020   AST  15 10/19/2020   ALT 12 10/19/2020   PROT 7.3 10/19/2020   ALBUMIN 4.2 10/19/2020   CALCIUM 9.6 10/19/2020   GFR 85.96 10/19/2020   Lab Results  Component Value Date   CHOL 149 01/24/2021   Lab Results  Component Value Date   HDL 52.00 01/24/2021   Lab Results  Component Value Date   LDLCALC 70 01/24/2021   Lab Results  Component Value Date   TRIG 135.0 01/24/2021   Lab Results  Component Value Date   CHOLHDL 3 01/24/2021   Lab Results  Component Value Date   HGBA1C 6.5 01/24/2021       Assessment & Plan:   Problem List Items Addressed This Visit       Unprioritized   Hypothyroidism - Primary   Relevant Orders   Thyroid Panel With TSH   Right hip pain    ? Bursitis ---- pred taper  Ibuprofen prn pain  Ice , rest  rto prn       Relevant Medications   predniSONE (DELTASONE) 10 MG tablet   Other Relevant  Orders   DG Hip Unilat W OR W/O Pelvis 2-3 Views Right    Meds ordered this encounter  Medications   predniSONE (DELTASONE) 10 MG tablet    Sig: TAKE 3 TABLETS PO QD FOR 3 DAYS THEN TAKE 2 TABLETS PO QD FOR 3 DAYS THEN TAKE 1 TABLET PO QD FOR 3 DAYS THEN TAKE 1/2 TAB PO QD FOR 3 DAYS    Dispense:  20 tablet    Refill:  0    I,Zite Okoli,acting as a Education administrator for [employer], DO.,have documented all relevant documentation on the behalf of [name], DO,as directed by  [name], DO while in the presence of [name], DO.   I, [name], DO. , personally preformed the services described in this documentation.  All medical record entries made by the scribe were at my direction and in my presence.  I have reviewed the chart and discharge instructions (if applicable) and agree that the record reflects my personal performance and is accurate and complete. 04/05/2021

## 2021-04-05 NOTE — Patient Instructions (Signed)
Hip Pain The hip is the joint between the upper legs and the lower pelvis. The bones, cartilage, tendons, and muscles of your hip joint support your body and allow you to move around. Hip pain can range from a minor ache to severe pain in one or both of your hips. The pain may be felt on the inside of the hip joint near the groin, or on the outside near the buttocks and upper thigh. You may also have swelling or stiffness in your hip area. Follow these instructions at home: Managing pain, stiffness, and swelling   If directed, put ice on the painful area. To do this: Put ice in a plastic bag. Place a towel between your skin and the bag. Leave the ice on for 20 minutes, 2-3 times a day. If directed, apply heat to the affected area as often as told by your health care provider. Use the heat source that your health care provider recommends, such as a moist heat pack or a heating pad. Place a towel between your skin and the heat source. Leave the heat on for 20-30 minutes. Remove the heat if your skin turns bright red. This is especially important if you are unable to feel pain, heat, or cold. You may have a greater risk of getting burned. Activity Do exercises as told by your health care provider. Avoid activities that cause pain. General instructions  Take over-the-counter and prescription medicines only as told by your health care provider. Keep a journal of your symptoms. Write down: How often you have hip pain. The location of your pain. What the pain feels like. What makes the pain worse. Sleep with a pillow between your legs on your most comfortable side. Keep all follow-up visits as told by your health care provider. This is important. Contact a health care provider if: You cannot put weight on your leg. Your pain or swelling continues or gets worse after one week. It gets harder to walk. You have a fever. Get help right away if: You fall. You have a sudden increase in pain and  swelling in your hip. Your hip is red or swollen or very tender to touch. Summary Hip pain can range from a minor ache to severe pain in one or both of your hips. The pain may be felt on the inside of the hip joint near the groin, or on the outside near the buttocks and upper thigh. Avoid activities that cause pain. Write down how often you have hip pain, the location of the pain, what makes it worse, and what it feels like. This information is not intended to replace advice given to you by your health care provider. Make sure you discuss any questions you have with your health care provider. Document Revised: 12/27/2018 Document Reviewed: 12/27/2018 Elsevier Patient Education  2022 [employer]  

## 2021-04-05 NOTE — Assessment & Plan Note (Signed)
?   Bursitis ---- pred taper  Ibuprofen prn pain  Ice , rest  rto prn

## 2021-04-06 LAB — THYROID PANEL WITH TSH
Free Thyroxine Index: 3 (ref 1.4–3.8)
T3 Uptake: 26 % (ref 22–35)
T4, Total: 11.5 ug/dL (ref 5.1–11.9)
TSH: 0.63 mIU/L

## 2021-04-09 ENCOUNTER — Encounter: Payer: Self-pay | Admitting: Family Medicine

## 2021-04-12 ENCOUNTER — Telehealth (HOSPITAL_COMMUNITY): Payer: Self-pay

## 2021-04-12 MED ORDER — ZOLPIDEM TARTRATE 10 MG PO TABS: 10.0000 mg | ORAL_TABLET | Freq: Every evening | ORAL | 0 refills | Status: DC | PRN

## 2021-04-12 NOTE — Telephone Encounter (Signed)
Patient is requesting a refill on Ambien.  [facility]

## 2021-04-15 ENCOUNTER — Encounter: Payer: Self-pay | Admitting: Family Medicine

## 2021-04-15 ENCOUNTER — Ambulatory Visit (INDEPENDENT_AMBULATORY_CARE_PROVIDER_SITE_OTHER): Payer: BC Managed Care – PPO | Admitting: Family Medicine

## 2021-04-15 ENCOUNTER — Other Ambulatory Visit: Payer: Self-pay

## 2021-04-15 VITALS — BP 146/90 | HR 77 | Temp 98.3°F | Resp 18 | Ht 62.75 in | Wt 179.8 lb

## 2021-04-15 DIAGNOSIS — E039 Hypothyroidism, unspecified: Secondary | ICD-10-CM | POA: Diagnosis not present

## 2021-04-15 DIAGNOSIS — R739 Hyperglycemia, unspecified: Secondary | ICD-10-CM | POA: Diagnosis not present

## 2021-04-15 DIAGNOSIS — I10 Essential (primary) hypertension: Secondary | ICD-10-CM

## 2021-04-15 DIAGNOSIS — E785 Hyperlipidemia, unspecified: Secondary | ICD-10-CM

## 2021-04-15 NOTE — Progress Notes (Signed)
Subjective:     Katie [name] is a 55 y.o. female and is here for a comprehensive physical exam. The patient reports no problems.  Social History   Socioeconomic History   Marital status: Married    Spouse name: Not on file   Number of children: 2   Years of education: 15   Highest education level: Not on file  Occupational History   Occupation: scheduler [employer]  Tobacco Use   Smoking status: Former    Packs/day: 1.00    Types: Cigarettes    Quit date: 01/14/1995    Years since quitting: 26.2   Smokeless tobacco: Never   Tobacco comments:    started smoking at age 22 1ppd  Vaping Use   Vaping Use: Never used  Substance and Sexual Activity   Alcohol use: Yes    Alcohol/week: 2.0 standard drinks    Types: 2 Cans of beer per week    Comment: Reports a couple of beers 1- 2 times a month   Drug use: No   Sexual activity: Not Currently    Partners: Male    Comment: 1st intercourse 55 yo-More than 5 partners-BTL  Other Topics Concern   Not on file  Social History Narrative   Exercise-- no   Social Determinants of Health   Financial Resource Strain: Not on file  Food Insecurity: Not on file  Transportation Needs: Not on file  Physical Activity: Not on file  Stress: Not on file  Social Connections: Not on file  Intimate Partner Violence: Not on file   Health Maintenance  Topic Date Due   OPHTHALMOLOGY EXAM  Never done   Pneumococcal Vaccine 74-27 Years old (2 - PCV) 03/19/2019   FOOT EXAM  03/19/2019   COVID-19 Vaccine (3 - Pfizer risk series) 05/09/2020   PAP SMEAR-Modifier  01/26/2021   INFLUENZA VACCINE  03/25/2021   HIV Screening  03/18/2024 (Originally 04/25/1981)   HEMOGLOBIN A1C  07/26/2021   URINE MICROALBUMIN  10/19/2021   MAMMOGRAM  11/07/2021   TETANUS/TDAP  01/27/2028   COLONOSCOPY (Pts 45-78yr Insurance coverage will need to be confirmed)  09/12/2030   PNEUMOCOCCAL POLYSACCHARIDE VACCINE AGE 70-64 HIGH RISK  Completed   Hepatitis C Screening   Completed   Zoster Vaccines- Shingrix  Completed   HPV VACCINES  Aged Out    The following portions of the patient's history were reviewed and updated as appropriate: She  has a past medical history of Allergy, Anemia, Anxiety, Blood transfusion without reported diagnosis, Hyperlipidemia, Hypertension, Migraine, PVC (premature ventricular contraction), RLS (restless legs syndrome), and Thyroid disease. She does not have any pertinent problems on file. She  has a past surgical history that includes Tubal ligation (04/1999); Carpal tunnel release; Septoplasty (6/05); Cryoablation (11/04); Ectopic pregnancy surgery (1998); Bunionectomy (06/04/2009); Endometrial ablation; and Upper gastrointestinal endoscopy. Her family history includes Alzheimer's disease in her maternal grandmother; Aneurysm in her mother; Cancer in her maternal grandfather, maternal grandmother, paternal grandfather, and paternal grandmother; Colon cancer in her paternal grandfather; Depression in her father; Diabetes in her maternal grandmother; Hypertension in her maternal grandmother and sister; Lung cancer in her maternal grandfather; Osteoporosis in her maternal grandmother; Thyroid disease in her sister; Uterine cancer in her maternal grandmother. She  reports that she quit smoking about 26 years ago. Her smoking use included cigarettes. She smoked an average of 1 pack per day. She has never used smokeless tobacco. She reports current alcohol use of about 2.0 standard drinks per week. She reports that  she does not use drugs. She has a current medication list which includes the following prescription(s): alprazolam, amlodipine, cetirizine, desvenlafaxine, fenofibrate, aviane, levothyroxine, rosuvastatin, valacyclovir, zolpidem, prednisone, [DISCONTINUED] lamotrigine, and [DISCONTINUED] quetiapine fumarate. Current Outpatient Medications on File Prior to Visit  Medication Sig Dispense Refill   ALPRAZolam (XANAX) 0.25 MG tablet Take 1  tablet (0.25 mg total) by mouth 2 (two) times daily as needed for anxiety. 20 tablet 0   amLODipine (NORVASC) 5 MG tablet Take 1 tablet (5 mg total) by mouth daily. 90 tablet 1   cetirizine (ZYRTEC) 10 MG tablet Take 1 tablet (10 mg total) by mouth daily.     desvenlafaxine (PRISTIQ) 100 MG 24 hr tablet Take 1 tablet (100 mg total) by mouth daily. 90 tablet 1   fenofibrate 160 MG tablet Take 1 tablet (160 mg total) by mouth daily. 90 tablet 1   levonorgestrel-ethinyl estradiol (AVIANE) 0.1-20 MG-MCG tablet TAKE 1 TABLET BY MOUTH DAILY 84 tablet 0   levothyroxine (SYNTHROID) 150 MCG tablet Take 1 tablet (150 mcg total) by mouth daily. 90 tablet 1   rosuvastatin (CRESTOR) 10 MG tablet Take 1 tablet (10 mg total) by mouth daily. 90 tablet 1   valACYclovir (VALTREX) 1000 MG tablet Take 1 tablet (1,000 mg total) by mouth 3 (three) times daily. (Patient taking differently: Take 1,000 mg by mouth 3 (three) times daily. As needed.) 30 tablet 2   zolpidem (AMBIEN) 10 MG tablet Take 1 tablet (10 mg total) by mouth at bedtime as needed for sleep. TAKE ONE AT NIGHT FOR SLEEP 30 tablet 0   predniSONE (DELTASONE) 10 MG tablet TAKE 3 TABLETS PO QD FOR 3 DAYS THEN TAKE 2 TABLETS PO QD FOR 3 DAYS THEN TAKE 1 TABLET PO QD FOR 3 DAYS THEN TAKE 1/2 TAB PO QD FOR 3 DAYS (Patient not taking: Reported on 04/15/2021) 20 tablet 0   [DISCONTINUED] lamoTRIgine (LAMICTAL) 25 MG tablet Take 2 tablets (50 mg total) by mouth daily. (Patient not taking: Reported on 01/31/2021) 60 tablet 0   [DISCONTINUED] QUEtiapine Fumarate (SEROQUEL XR) 150 MG 24 hr tablet Take 1 tablet (150 mg total) by mouth at bedtime. (Patient not taking: Reported on 01/31/2021) 30 tablet 0   No current facility-administered medications on file prior to visit.   She is allergic to morphine and lisinopril..  Review of Systems Review of Systems  Constitutional: Negative for activity change, appetite change and fatigue.  HENT: Negative for hearing loss,  congestion, tinnitus and ear discharge.  dentist q66mEyes: Negative for visual disturbance (see optho q1y -- vision corrected to 20/20 with glasses).  Respiratory: Negative for cough, chest tightness and shortness of breath.   Cardiovascular: Negative for chest pain, palpitations and leg swelling.  Gastrointestinal: Negative for abdominal pain, diarrhea, constipation and abdominal distention.  Genitourinary: Negative for urgency, frequency, decreased urine volume and difficulty urinating.  Musculoskeletal: Negative for back pain, arthralgias and gait problem.  Skin: Negative for color change, pallor and rash.  Neurological: Negative for dizziness, light-headedness, numbness and headaches.  Hematological: Negative for adenopathy. Does not bruise/bleed easily.  Psychiatric/Behavioral: Negative for suicidal ideas, confusion, sleep disturbance, self-injury, dysphoric mood, decreased concentration and agitation.      Objective:    BP (!) 146/90 (BP Location: Right Arm, Patient Position: Sitting, Cuff Size: Normal)   Pulse 77   Temp 98.3 F (36.8 C) (Oral)   Resp 18   Ht 5' 2.75" (1.594 m)   Wt 179 lb 12.8 oz (81.6 kg)   SpO2  98%   BMI 32.10 kg/m  General appearance: alert, cooperative, appears stated age, and no distress Head: Normocephalic, without obvious abnormality, atraumatic Eyes: negative findings: lids and lashes normal, conjunctivae and sclerae normal, and pupils equal, round, reactive to light and accomodation Ears: normal TM's and external ear canals both ears Neck: no adenopathy, no carotid bruit, no JVD, supple, symmetrical, trachea midline, and thyroid not enlarged, symmetric, no tenderness/mass/nodules Back: symmetric, no curvature. ROM normal. No CVA tenderness. Lungs: clear to auscultation bilaterally Heart: regular rate and rhythm, S1, S2 normal, no murmur, click, rub or gallop Abdomen: soft, non-tender; bowel sounds normal; no masses,  no organomegaly Extremities:  extremities normal, atraumatic, no cyanosis or edema Pulses: 2+ and symmetric Skin: Skin color, texture, turgor normal. No rashes or lesions    Assessment:    Healthy female exam    Plan:    Ghm utd Check labs  See After Visit Summary for Counseling Recommendations   1. Primary hypertension Well controlled, no changes to meds. Encouraged heart healthy diet such as the DASH diet and exercise as tolerated.  Slightly high today but was perfect last week when she was in here an is lower at home   - Lipid panel - CBC with Differential/Platelet - TSH - Comprehensive metabolic panel  2. Hypothyroidism, unspecified type Con't meds Check labs  - TSH  3. Hyperlipidemia, unspecified hyperlipidemia type Encourage heart healthy diet such as MIND or DASH diet, increase exercise, avoid trans fats, simple carbohydrates and processed foods, consider a krill or fish or flaxseed oil cap daily.   - Lipid panel - Comprehensive metabolic panel  4. Hyperglycemia Check labs  - Hemoglobin A1c

## 2021-04-15 NOTE — Patient Instructions (Signed)
Preventive Care 68-53 Years Old, Female Preventive care refers to lifestyle choices and visits with your health care provider that can promote health and wellness. This includes: A yearly physical exam. This is also called an annual wellness visit. Regular dental and eye exams. Immunizations. Screening for certain conditions. Healthy lifestyle choices, such as: Eating a healthy diet. Getting regular exercise. Not using drugs or products that contain nicotine and tobacco. Limiting alcohol use. What can I expect for my preventive care visit? Physical exam Your health care provider will check your: Height and weight. These may be used to calculate your BMI (body mass index). BMI is a measurement that tells if you are at a healthy weight. Heart rate and blood pressure. Body temperature. Skin for abnormal spots. Counseling Your health care provider may ask you questions about your: Past medical problems. Family's medical history. Alcohol, tobacco, and drug use. Emotional well-being. Home life and relationship well-being. Sexual activity. Diet, exercise, and sleep habits. Work and work Statistician. Access to firearms. Method of birth control. Menstrual cycle. Pregnancy history. What immunizations do I need?  Vaccines are usually given at various ages, according to a schedule. Your health care provider will recommend vaccines for you based on your age, medicalhistory, and lifestyle or other factors, such as travel or where you work. What tests do I need? Blood tests Lipid and cholesterol levels. These may be checked every 5 years, or more often if you are over 37 years old. Hepatitis C test. Hepatitis B test. Screening Lung cancer screening. You may have this screening every year starting at age 30 if you have a 30-pack-year history of smoking and currently smoke or have quit within the past 15 years. Colorectal cancer screening. All adults should have this screening starting at  age 23 and continuing until age 3. Your health care provider may recommend screening at age 88 if you are at increased risk. You will have tests every 1-10 years, depending on your results and the type of screening test. Diabetes screening. This is done by checking your blood sugar (glucose) after you have not eaten for a while (fasting). You may have this done every 1-3 years. Mammogram. This may be done every 1-2 years. Talk with your health care provider about when you should start having regular mammograms. This may depend on whether you have a family history of breast cancer. BRCA-related cancer screening. This may be done if you have a family history of breast, ovarian, tubal, or peritoneal cancers. Pelvic exam and Pap test. This may be done every 3 years starting at age 79. Starting at age 54, this may be done every 5 years if you have a Pap test in combination with an HPV test. Other tests STD (sexually transmitted disease) testing, if you are at risk. Bone density scan. This is done to screen for osteoporosis. You may have this scan if you are at high risk for osteoporosis. Talk with your health care provider about your test results, treatment options,and if necessary, the need for more tests. Follow these instructions at home: Eating and drinking  Eat a diet that includes fresh fruits and vegetables, whole grains, lean protein, and low-fat dairy products. Take vitamin and mineral supplements as recommended by your health care provider. Do not drink alcohol if: Your health care provider tells you not to drink. You are pregnant, may be pregnant, or are planning to become pregnant. If you drink alcohol: Limit how much you have to 0-1 drink a day. Be aware  of how much alcohol is in your drink. In the [place] one drink equals one 12 oz bottle of beer (355 mL), one 5 oz glass of wine (148 mL), or one 1 oz glass of hard liquor (44 mL).  Lifestyle Take daily care of your teeth and  gums. Brush your teeth every morning and night with fluoride toothpaste. Floss one time each day. Stay active. Exercise for at least 30 minutes 5 or more days each week. Do not use any products that contain nicotine or tobacco, such as cigarettes, e-cigarettes, and chewing tobacco. If you need help quitting, ask your health care provider. Do not use drugs. If you are sexually active, practice safe sex. Use a condom or other form of protection to prevent STIs (sexually transmitted infections). If you do not wish to become pregnant, use a form of birth control. If you plan to become pregnant, see your health care provider for a prepregnancy visit. If told by your health care provider, take low-dose aspirin daily starting at age 29. Find healthy ways to cope with stress, such as: Meditation, yoga, or listening to music. Journaling. Talking to a trusted person. Spending time with friends and family. Safety Always wear your seat belt while driving or riding in a vehicle. Do not drive: If you have been drinking alcohol. Do not ride with someone who has been drinking. When you are tired or distracted. While texting. Wear a helmet and other protective equipment during sports activities. If you have firearms in your house, make sure you follow all gun safety procedures. What's next? Visit your health care provider once a year for an annual wellness visit. Ask your health care provider how often you should have your eyes and teeth checked. Stay up to date on all vaccines. This information is not intended to replace advice given to you by your health care provider. Make sure you discuss any questions you have with your healthcare provider. Document Revised: 05/15/2020 Document Reviewed: 04/22/2018 Elsevier Patient Education  2022 [employer].

## 2021-04-16 LAB — CBC WITH DIFFERENTIAL/PLATELET
Basophils Absolute: 0.1 10*3/uL (ref 0.0–0.1)
Basophils Relative: 1.1 % (ref 0.0–3.0)
Eosinophils Absolute: 0.1 10*3/uL (ref 0.0–0.7)
Eosinophils Relative: 2.3 % (ref 0.0–5.0)
HCT: 31.5 % — ABNORMAL LOW (ref 36.0–46.0)
Hemoglobin: 9.9 g/dL — ABNORMAL LOW (ref 12.0–15.0)
Lymphocytes Relative: 41.5 % (ref 12.0–46.0)
Lymphs Abs: 2.7 10*3/uL (ref 0.7–4.0)
MCHC: 31.5 g/dL (ref 30.0–36.0)
MCV: 74.2 fl — ABNORMAL LOW (ref 78.0–100.0)
Monocytes Absolute: 0.5 10*3/uL (ref 0.1–1.0)
Monocytes Relative: 8.1 % (ref 3.0–12.0)
Neutro Abs: 3.1 10*3/uL (ref 1.4–7.7)
Neutrophils Relative %: 47 % (ref 43.0–77.0)
Platelets: 487 10*3/uL — ABNORMAL HIGH (ref 150.0–400.0)
RBC: 4.24 Mil/uL (ref 3.87–5.11)
RDW: 15.9 % — ABNORMAL HIGH (ref 11.5–15.5)
WBC: 6.5 10*3/uL (ref 4.0–10.5)

## 2021-04-16 LAB — TSH: TSH: 2.1 u[IU]/mL (ref 0.35–5.50)

## 2021-04-16 LAB — HEMOGLOBIN A1C: Hgb A1c MFr Bld: 6.5 % (ref 4.6–6.5)

## 2021-04-17 ENCOUNTER — Telehealth: Payer: Self-pay

## 2021-04-17 DIAGNOSIS — I10 Essential (primary) hypertension: Secondary | ICD-10-CM

## 2021-04-17 DIAGNOSIS — E785 Hyperlipidemia, unspecified: Secondary | ICD-10-CM

## 2021-04-17 NOTE — Telephone Encounter (Signed)
Spoke with patient and advised we need to repeat CMP, patient scheduled for 04/18/2021 at 3:15 no charge and patient verbalized understanding with no further questions.

## 2021-04-18 ENCOUNTER — Other Ambulatory Visit: Payer: Self-pay

## 2021-04-18 ENCOUNTER — Other Ambulatory Visit (INDEPENDENT_AMBULATORY_CARE_PROVIDER_SITE_OTHER): Payer: BC Managed Care – PPO

## 2021-04-18 DIAGNOSIS — I10 Essential (primary) hypertension: Secondary | ICD-10-CM | POA: Diagnosis not present

## 2021-04-18 DIAGNOSIS — E785 Hyperlipidemia, unspecified: Secondary | ICD-10-CM

## 2021-04-19 LAB — COMPREHENSIVE METABOLIC PANEL
ALT: 12 U/L (ref 0–35)
AST: 16 U/L (ref 0–37)
Albumin: 4.1 g/dL (ref 3.5–5.2)
Alkaline Phosphatase: 47 U/L (ref 39–117)
BUN: 10 mg/dL (ref 6–23)
CO2: 24 mEq/L (ref 19–32)
Calcium: 9.1 mg/dL (ref 8.4–10.5)
Chloride: 102 mEq/L (ref 96–112)
Creatinine, Ser: 0.84 mg/dL (ref 0.40–1.20)
GFR: 79.58 mL/min (ref >60.00)
Glucose, Bld: 140 mg/dL — ABNORMAL HIGH (ref 70–99)
Potassium: 4.6 mEq/L (ref 3.5–5.1)
Sodium: 135 mEq/L (ref 135–145)
Total Bilirubin: 0.3 mg/dL (ref 0.2–1.2)
Total Protein: 7 g/dL (ref 6.0–8.3)

## 2021-04-22 ENCOUNTER — Other Ambulatory Visit: Payer: Self-pay | Admitting: Family Medicine

## 2021-04-22 DIAGNOSIS — D649 Anemia, unspecified: Secondary | ICD-10-CM

## 2021-04-22 DIAGNOSIS — R739 Hyperglycemia, unspecified: Secondary | ICD-10-CM

## 2021-04-23 ENCOUNTER — Other Ambulatory Visit: Payer: Self-pay | Admitting: Family Medicine

## 2021-04-23 ENCOUNTER — Telehealth: Payer: Self-pay | Admitting: Family Medicine

## 2021-04-23 DIAGNOSIS — E785 Hyperlipidemia, unspecified: Secondary | ICD-10-CM

## 2021-04-23 DIAGNOSIS — I10 Essential (primary) hypertension: Secondary | ICD-10-CM

## 2021-04-23 NOTE — Telephone Encounter (Signed)
-----   Message from Sanda Linger, [place] sent at 04/23/2021  9:08 AM EDT ----- Pt has viewed via Mychart and labs have been ordered. Pt needs a lab appt in the next 2-4 weeks please

## 2021-04-23 NOTE — Telephone Encounter (Signed)
Called patient to get set up with a lab appt in 2-4 weeks to repeat  Patients mail box is full and couldn't leave a VM I sent a mychart message for her to call and make an appt for 2-4 weeks per [facility]

## 2021-04-25 ENCOUNTER — Encounter: Payer: Self-pay | Admitting: Nurse Practitioner

## 2021-04-25 ENCOUNTER — Other Ambulatory Visit: Payer: Self-pay

## 2021-04-25 ENCOUNTER — Ambulatory Visit (INDEPENDENT_AMBULATORY_CARE_PROVIDER_SITE_OTHER): Payer: BC Managed Care – PPO | Admitting: Nurse Practitioner

## 2021-04-25 VITALS — BP 124/80 | Ht 62.0 in | Wt 179.0 lb

## 2021-04-25 DIAGNOSIS — Z01419 Encounter for gynecological examination (general) (routine) without abnormal findings: Secondary | ICD-10-CM | POA: Diagnosis not present

## 2021-04-25 DIAGNOSIS — N920 Excessive and frequent menstruation with regular cycle: Secondary | ICD-10-CM | POA: Diagnosis not present

## 2021-04-25 MED ORDER — NORELGESTROMIN-ETH ESTRADIOL 150-35 MCG/24HR TD PTWK: 1.0000 | MEDICATED_PATCH | TRANSDERMAL | 4 refills | Status: DC

## 2021-04-25 NOTE — Progress Notes (Signed)
   Katie [name] 1966-02-16 [id number]   History:  55 y.o. [id number] presents for annual exam. OCPs continuously with withdrawal bleeding every 3-4 months. She is interested in a different form of contraception so she does not have to remember to take daily pill. History of menstrual migraines and menorrhagia that is well controlled with OCPs. BTL. 2008 endometrial ablation with no bleeding initially but a few years later began having heavy cycles again. Normal pap and mammogram history. T2DM and Hashimoto's managed by primary care.   Gynecologic History Patient's last menstrual period was 03/21/2021. Period Cycle (Days): 90 Period Duration (Days): 5 Period Pattern: Regular Menstrual Flow: Moderate, Light Dysmenorrhea: (!) Mild Dysmenorrhea Symptoms: Cramping Contraception: OCP (estrogen/progesterone) and tubal ligation  Health Maintenance Last Pap: 01/26/2018. Results were: normal, 5-year repeat Last mammogram: 11/07/2020. Results were: normal Last colonoscopy: 09/12/2020. Results were: Normal, 10-year recall Last Dexa: Not indicated  Past medical history, past surgical history, family history and social history were all reviewed and documented in the EPIC chart. Married. 2 children, 7 grandchildren. Maternal grandmother with history of ovarian cancer.   ROS:  A ROS was performed and pertinent positives and negatives are included.  Exam:  Vitals:   04/25/21 1150  BP: 124/80  Weight: 179 lb (81.2 kg)  Height: '5\' 2"'$  (1.575 m)    Body mass index is 32.74 kg/m.  General appearance:  Normal Thyroid:  Symmetrical, normal in size, without palpable masses or nodularity. Respiratory  Auscultation:  Clear without wheezing or rhonchi Cardiovascular  Auscultation:  Regular rate, without rubs, murmurs or gallops  Edema/varicosities:  Not grossly evident Abdominal  Soft,nontender, without masses, guarding or rebound.  Liver/spleen:  No organomegaly noted  Hernia:  None appreciated   Skin  Inspection:  Grossly normal   Breasts: Examined lying and sitting.   Right: Without masses, retractions, discharge or axillary adenopathy.   Left: Without masses, retractions, discharge or axillary adenopathy. Gentitourinary   Inguinal/mons:  Normal without inguinal adenopathy  External genitalia:  Normal  BUS/Urethra/Skene's glands:  Normal  Vagina:  Normal  Cervix:  Normal  Uterus:  Normal in size, shape and contour.  Midline and mobile  Adnexa/parametria:     Rt: Without masses or tenderness.   Lt: Without masses or tenderness.  Anus and perineum: Normal  Digital rectal exam: Normal sphincter tone without palpated masses or tenderness  Assessment/Plan:  55 y.o. Katie [name]:[id number] for annual exam.   Well female exam with routine gynecological exam - Education provided on SBEs, importance of preventative screenings, current guidelines, high calcium diet, regular exercise, and multivitamin daily. Labs with PCP.   Menorrhagia with regular cycle - Plan: norelgestromin-ethinyl estradiol (ORTHO EVRA) 150-35 MCG/24HR transdermal patch weekly. She does not want to have menses, so recommended skipping placebo week and alternate patch weekly. We discussed all other methods including LARCs and she would like patch.   Screening for cervical cancer - Normal Pap history.  Will repeat at 5-year interval per guidelines.  Screening for breast cancer - Normal mammogram history.  Continue annual screenings.  Normal breast exam today.  Screening for colon cancer - 2022 colonoscopy. Will repeat at GI's recommended interval.   Follow up in 1 year for annual      [place], 12:23 PM 04/25/2021

## 2021-05-07 ENCOUNTER — Telehealth (HOSPITAL_COMMUNITY): Payer: Self-pay

## 2021-05-07 MED ORDER — ZOLPIDEM TARTRATE 10 MG PO TABS: 10.0000 mg | ORAL_TABLET | Freq: Every evening | ORAL | 0 refills | Status: DC | PRN

## 2021-05-07 NOTE — Telephone Encounter (Signed)
Medication refill - Telephone call with pt, after she left a message she will need a new Ambien order prior to her appt 05/14/21.  Pt. reported she will run out on 05/12/21 and is requesting a new order be e-scribed in so she does not run ot prior to appt.  Patient requests the new order be sent into her [facility] and agreed to send this request to Dr. De Nurse.

## 2021-05-07 NOTE — Telephone Encounter (Signed)
Medication management - Telephone call back with patient to inform Dr. De Nurse had sent in her requested refill order for Ambien to fill this coming weekend once needed.  Patient to call if any issues.

## 2021-05-13 ENCOUNTER — Other Ambulatory Visit: Payer: Self-pay

## 2021-05-13 ENCOUNTER — Other Ambulatory Visit (INDEPENDENT_AMBULATORY_CARE_PROVIDER_SITE_OTHER): Payer: BC Managed Care – PPO

## 2021-05-13 DIAGNOSIS — R739 Hyperglycemia, unspecified: Secondary | ICD-10-CM | POA: Diagnosis not present

## 2021-05-13 DIAGNOSIS — D649 Anemia, unspecified: Secondary | ICD-10-CM | POA: Diagnosis not present

## 2021-05-13 LAB — CBC WITH DIFFERENTIAL/PLATELET
Basophils Absolute: 0 10*3/uL (ref 0.0–0.1)
Basophils Relative: 0.7 % (ref 0.0–3.0)
Eosinophils Absolute: 0.1 10*3/uL (ref 0.0–0.7)
Eosinophils Relative: 2.7 % (ref 0.0–5.0)
HCT: 33.2 % — ABNORMAL LOW (ref 36.0–46.0)
Hemoglobin: 10.2 g/dL — ABNORMAL LOW (ref 12.0–15.0)
Lymphocytes Relative: 36.4 % (ref 12.0–46.0)
Lymphs Abs: 1.7 10*3/uL (ref 0.7–4.0)
MCHC: 30.7 g/dL (ref 30.0–36.0)
MCV: 72.1 fl — ABNORMAL LOW (ref 78.0–100.0)
Monocytes Absolute: 0.4 10*3/uL (ref 0.1–1.0)
Monocytes Relative: 8 % (ref 3.0–12.0)
Neutro Abs: 2.5 10*3/uL (ref 1.4–7.7)
Neutrophils Relative %: 52.2 % (ref 43.0–77.0)
Platelets: 517 10*3/uL — ABNORMAL HIGH (ref 150.0–400.0)
RBC: 4.61 Mil/uL (ref 3.87–5.11)
RDW: 15.6 % — ABNORMAL HIGH (ref 11.5–15.5)
WBC: 4.7 10*3/uL (ref 4.0–10.5)

## 2021-05-13 LAB — HEMOGLOBIN A1C: Hgb A1c MFr Bld: 6.5 % (ref 4.6–6.5)

## 2021-05-13 LAB — COMPREHENSIVE METABOLIC PANEL
ALT: 12 U/L (ref 0–35)
AST: 16 U/L (ref 0–37)
Albumin: 4.3 g/dL (ref 3.5–5.2)
Alkaline Phosphatase: 50 U/L (ref 39–117)
BUN: 13 mg/dL (ref 6–23)
CO2: 26 mEq/L (ref 19–32)
Calcium: 9.6 mg/dL (ref 8.4–10.5)
Chloride: 101 mEq/L (ref 96–112)
Creatinine, Ser: 0.77 mg/dL (ref 0.40–1.20)
GFR: 88.3 mL/min (ref >60.00)
Glucose, Bld: 109 mg/dL — ABNORMAL HIGH (ref 70–99)
Potassium: 5 mEq/L (ref 3.5–5.1)
Sodium: 137 mEq/L (ref 135–145)
Total Bilirubin: 0.5 mg/dL (ref 0.2–1.2)
Total Protein: 7.5 g/dL (ref 6.0–8.3)

## 2021-05-13 LAB — IBC + FERRITIN
Ferritin: 3.6 ng/mL — ABNORMAL LOW (ref 10.0–291.0)
Iron: 23 ug/dL — ABNORMAL LOW (ref 42–145)
Saturation Ratios: 3.1 % — ABNORMAL LOW (ref 20.0–50.0)
TIBC: 747.6 ug/dL — ABNORMAL HIGH (ref 250.0–450.0)
Transferrin: 534 mg/dL — ABNORMAL HIGH (ref 212.0–360.0)

## 2021-05-14 ENCOUNTER — Ambulatory Visit (INDEPENDENT_AMBULATORY_CARE_PROVIDER_SITE_OTHER): Payer: BC Managed Care – PPO | Admitting: Psychiatry

## 2021-05-14 ENCOUNTER — Encounter (HOSPITAL_COMMUNITY): Payer: Self-pay | Admitting: Psychiatry

## 2021-05-14 VITALS — BP 130/80 | Temp 98.0°F | Ht 62.75 in | Wt 177.0 lb

## 2021-05-14 DIAGNOSIS — F411 Generalized anxiety disorder: Secondary | ICD-10-CM | POA: Diagnosis not present

## 2021-05-14 DIAGNOSIS — F5102 Adjustment insomnia: Secondary | ICD-10-CM

## 2021-05-14 DIAGNOSIS — F0630 Mood disorder due to known physiological condition, unspecified: Secondary | ICD-10-CM

## 2021-05-14 NOTE — Progress Notes (Signed)
[place] Follow up visit  Patient Identification: Katie [name] MRN:  [id number] Date of Evaluation:  05/14/2021 Referral Source: primary care Chief Complaint:  follow up insomnia   Visit Diagnosis:    ICD-10-CM   1. Adjustment insomnia  F51.02     2. Mood disorder in conditions classified elsewhere  F06.30     3. 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   Aggravating factors; job stress  Distant relationship with one of her son. Modifying factors sister,, camping  Duration since young age   She was seen at age 67 by her counselor after her father's death 1 time no medication given       Past Psychiatric History: insomnia, anxiety  Previous Psychotropic Medications: Yes  Zoloft, seroquel   Past Medical History:  Past Medical History:  Diagnosis Date   Allergy    Anemia    Anxiety    Blood transfusion without reported diagnosis    Hyperlipidemia    Hypertension    Migraine    PVC (premature ventricular contraction)    RLS (restless legs syndrome)    Thyroid disease     Past Surgical History:  Procedure Laterality Date   BUNIONECTOMY  06/04/2009   Right foot   CARPAL TUNNEL RELEASE     Bilateral   CRYOABLATION  11/04   [place]   ENDOMETRIAL ABLATION     SEPTOPLASTY  6/05   TUBAL LIGATION  04/1999   UPPER GASTROINTESTINAL ENDOSCOPY      Family Psychiatric History:  Dad: committed suicide, see chart , Niece; depression  Family History:  Family History  Problem Relation Age of Onset   Depression Father        Suicide   Aneurysm Mother        Brain   Hypertension Sister    Thyroid disease Sister    Diabetes Maternal Grandmother    Alzheimer's disease Maternal Grandmother    Uterine cancer Maternal Grandmother    Hypertension Maternal Grandmother    Osteoporosis Maternal Grandmother    Cancer Maternal Grandmother        uterine   Lung cancer Maternal Grandfather    Cancer Maternal Grandfather        lung   Cancer Paternal Grandmother        metatstatic-- ? primary colon   Cancer Paternal Grandfather        skin, hepatic   Colon cancer Paternal Grandfather        age unknown   Colon polyps Neg Hx    Rectal cancer Neg Hx    Stomach cancer Neg Hx    Esophageal cancer Neg Hx     Social History:   Social History   Socioeconomic History   Marital status: Married    Spouse name: Not on file   Number of children: 2   Years of education: 67  Highest education level: Not on file  Occupational History   Occupation: scheduler [employer]  Tobacco Use   Smoking status: Former    Packs/day: 1.00    Types: Cigarettes    Quit date: 01/14/1995    Years since quitting: 26.3   Smokeless tobacco: Never   Tobacco comments:    started smoking at age 46 1ppd  Vaping Use   Vaping Use: Never used  Substance and Sexual Activity   Alcohol use: Yes    Alcohol/week: 2.0 standard drinks    Types: 2 Cans of beer per week    Comment: Reports a couple of beers 1- 2 times a month   Drug use: No   Sexual activity: Not Currently    Partners: Male    Comment: 1st intercourse 55 yo-More than 5 partners-BTL  Other Topics Concern   Not on file  Social History Narrative   Exercise-- no   Social Determinants of Health   Financial Resource Strain: Not on file  Food Insecurity: Not on file  Transportation Needs: Not on file  Physical Activity: Not  on file  Stress: Not on file  Social Connections: Not on file     Allergies:   Allergies  Allergen Reactions   Morphine     Nausea & vomiting   Lisinopril     sweating    Metabolic Disorder Labs: Lab Results  Component Value Date   HGBA1C 6.5 05/13/2021   No results found for: PROLACTIN Lab Results  Component Value Date   CHOL 149 01/24/2021   TRIG 135.0 01/24/2021   HDL 52.00 01/24/2021   CHOLHDL 3 01/24/2021   VLDL 27.0 01/24/2021   LDLCALC 70 01/24/2021   LDLCALC 66 10/19/2020   Lab Results  Component Value Date   TSH 2.10 04/15/2021    Therapeutic Level Labs: No results found for: LITHIUM No results found for: CBMZ No results found for: VALPROATE  Current Medications: Current Outpatient Medications  Medication Sig Dispense Refill   ALPRAZolam (XANAX) 0.25 MG tablet Take 1 tablet (0.25 mg total) by mouth 2 (two) times daily as needed for anxiety. 20 tablet 0   amLODipine (NORVASC) 5 MG tablet TAKE 1 TABLET BY MOUTH DAILY 90 tablet 1   cetirizine (ZYRTEC) 10 MG tablet Take 1 tablet (10 mg total) by mouth daily.     desvenlafaxine (PRISTIQ) 100 MG 24 hr tablet Take 1 tablet (100 mg total) by mouth daily. 90 tablet 1   fenofibrate 160 MG tablet TAKE 1 TABLET BY MOUTH DAILY 90 tablet 1   levothyroxine (SYNTHROID) 150 MCG tablet Take 1 tablet (150 mcg total) by mouth daily. 90 tablet 1   norelgestromin-ethinyl estradiol (ORTHO EVRA) 150-35 MCG/24HR transdermal patch Place 1 patch onto the skin once a week. Alternate patch weekly, skip placebo week 12 patch 4   rosuvastatin (CRESTOR) 10 MG tablet Take 1 tablet (10 mg total) by mouth daily. 90 tablet 1   valACYclovir (VALTREX) 1000 MG tablet Take 1 tablet (1,000 mg total) by mouth 3 (three) times daily. (Patient taking differently: Take 1,000 mg by mouth 3 (three) times daily. As needed.) 30 tablet 2   zolpidem (AMBIEN) 10 MG tablet Take 1 tablet (10 mg total) by mouth at bedtime as needed for sleep. TAKE ONE AT NIGHT  FOR SLEEP 30 tablet 0   No current facility-administered medications for this visit.      Psychiatric Specialty Exam: Review of Systems  Cardiovascular:  Negative for chest pain.  Psychiatric/Behavioral:  Negative for self-injury and suicidal ideas.    Blood pressure 130/80, temperature 98 F (36.7 C), height 5' 2.75" (1.594 m), weight 177 lb (80.3 kg).Body mass index is 31.6 kg/m.  General Appearance: Casual  Eye Contact:  Fair  Speech:  Clear and Coherent  Volume:  Normal  Mood:  Euthymic  Affect:  Full Range  Thought Process:  Goal Directed  Orientation:  Full (Time, Place, and Person)  Thought Content:  Rumination  Suicidal Thoughts:  No  Homicidal Thoughts:  No  Memory:  Immediate;   Fair Recent;   Fair  Judgement:  Fair  Insight:  Fair  Psychomotor Activity:  Normal  Concentration:  Concentration: Fair and Attention Span: Fair  Recall:  [employer] of Knowledge:Good  Language: Good  Akathisia:  No  Handed:    AIMS (if indicated):  not done  Assets:  Desire for Improvement Financial Resources/Insurance Physical Health Social Support  ADL's:  Intact  Cognition: WNL  Sleep:  Poor   Screenings: PHQ2-9    Flowsheet Row Office Visit from 12/13/2020 in [place] Office Visit from 10/19/2020 in [facility] [place] [facility] Visit from 03/19/2017 in [place] [facility] [place] Visit from 06/22/2015 in Primary Care at [place] from 05/01/2015 in Primary Care at [facility] Total Score 0 0 0 0 [place] Office Visit from 05/14/2021 in [place] Office Visit from 01/31/2021 in [place] Office Visit from 12/13/2020 in [place] No Risk No Risk No Risk       Assessment and Plan: as follows Prior  documentation reviewed  Mood disorder not otherwise specified; denies mood swings. On pristiq by pcp   Insomnia; improved on ambien, reviewed sleep hygiene, call for refills  Generalized anxiety disorder; continue Pristiq, manageable seldom takes xanax  Face to face time spent 10 - 15 min including documentation  Follow-up in 10m  Merian Capron, MD 9/20/20223:07 PM

## 2021-05-22 ENCOUNTER — Other Ambulatory Visit: Payer: Self-pay | Admitting: Family Medicine

## 2021-05-22 DIAGNOSIS — D508 Other iron deficiency anemias: Secondary | ICD-10-CM

## 2021-05-22 MED ORDER — FERROUS FUMARATE 325 (106 FE) MG PO TABS: 1.0000 | ORAL_TABLET | Freq: Every day | ORAL | 2 refills | Status: DC

## 2021-05-22 NOTE — Addendum Note (Signed)
Addended byDamita Dunnings D on: 05/22/2021 10:29 AM   Modules accepted: Orders

## 2021-05-30 ENCOUNTER — Other Ambulatory Visit: Payer: Self-pay | Admitting: Family Medicine

## 2021-05-30 DIAGNOSIS — F419 Anxiety disorder, unspecified: Secondary | ICD-10-CM

## 2021-05-30 MED ORDER — ALPRAZOLAM 0.25 MG PO TABS: 0.2500 mg | ORAL_TABLET | Freq: Two times a day (BID) | ORAL | 0 refills | Status: DC | PRN

## 2021-05-30 NOTE — Telephone Encounter (Signed)
Requesting: xanax Contract: N/A UDS:N/A Last Visit:04/15/21 Next Visit:N/A Last Refill:03/27/2021  Please Advise

## 2021-06-06 ENCOUNTER — Telehealth (HOSPITAL_COMMUNITY): Payer: Self-pay | Admitting: Psychiatry

## 2021-06-06 MED ORDER — ZOLPIDEM TARTRATE 10 MG PO TABS: 10.0000 mg | ORAL_TABLET | Freq: Every evening | ORAL | 0 refills | Status: DC | PRN

## 2021-06-06 NOTE — Telephone Encounter (Signed)
Pt needs a refill:   zolpidem (AMBIEN) 10 MG tablet  Send to: [place], [place] - [address]

## 2021-06-28 ENCOUNTER — Other Ambulatory Visit: Payer: Self-pay | Admitting: Family Medicine

## 2021-06-28 DIAGNOSIS — F419 Anxiety disorder, unspecified: Secondary | ICD-10-CM

## 2021-06-28 MED ORDER — ALPRAZOLAM 0.25 MG PO TABS: 0.2500 mg | ORAL_TABLET | Freq: Two times a day (BID) | ORAL | 0 refills | Status: DC | PRN

## 2021-06-28 NOTE — Telephone Encounter (Signed)
Requesting: alprazolam 0.25mg  Contract: 10/20/2019 UDS: 10/10/2019 Last Visit: 04/15/2021 Next Visit: None Last Refill: 05/30/2021  #20 x 0RF  Please Advise

## 2021-07-08 ENCOUNTER — Telehealth (HOSPITAL_COMMUNITY): Payer: Self-pay | Admitting: Psychiatry

## 2021-07-08 MED ORDER — ZOLPIDEM TARTRATE 10 MG PO TABS: 10.0000 mg | ORAL_TABLET | Freq: Every evening | ORAL | 0 refills | Status: DC | PRN

## 2021-07-08 NOTE — Telephone Encounter (Signed)
Refill  zolpidem (AMBIEN) 10 MG tablet    Send to  [place], [place] - [address]

## 2021-07-09 ENCOUNTER — Encounter: Payer: Self-pay | Admitting: Family Medicine

## 2021-07-09 DIAGNOSIS — E785 Hyperlipidemia, unspecified: Secondary | ICD-10-CM

## 2021-07-10 MED ORDER — ROSUVASTATIN CALCIUM 10 MG PO TABS: 10.0000 mg | ORAL_TABLET | Freq: Every day | ORAL | 1 refills | Status: DC

## 2021-07-22 ENCOUNTER — Encounter: Payer: Self-pay | Admitting: Family Medicine

## 2021-07-22 ENCOUNTER — Other Ambulatory Visit: Payer: Self-pay | Admitting: Family Medicine

## 2021-07-26 ENCOUNTER — Other Ambulatory Visit: Payer: Self-pay | Admitting: Family Medicine

## 2021-07-26 MED ORDER — LEVOTHYROXINE SODIUM 150 MCG PO TABS: 150.0000 ug | ORAL_TABLET | Freq: Every day | ORAL | 0 refills | Status: DC

## 2021-08-05 ENCOUNTER — Encounter: Payer: Self-pay | Admitting: Nurse Practitioner

## 2021-08-06 ENCOUNTER — Telehealth (HOSPITAL_COMMUNITY): Payer: Self-pay

## 2021-08-06 ENCOUNTER — Other Ambulatory Visit: Payer: Self-pay | Admitting: Nurse Practitioner

## 2021-08-06 DIAGNOSIS — N920 Excessive and frequent menstruation with regular cycle: Secondary | ICD-10-CM

## 2021-08-06 DIAGNOSIS — Z3041 Encounter for surveillance of contraceptive pills: Secondary | ICD-10-CM

## 2021-08-06 DIAGNOSIS — G43829 Menstrual migraine, not intractable, without status migrainosus: Secondary | ICD-10-CM

## 2021-08-06 MED ORDER — ZOLPIDEM TARTRATE 10 MG PO TABS
10.0000 mg | ORAL_TABLET | Freq: Every evening | ORAL | 0 refills | Status: DC | PRN
Start: 2021-08-06 — End: 2021-09-04

## 2021-08-06 MED ORDER — LEVONORGESTREL-ETHINYL ESTRAD 0.1-20 MG-MCG PO TABS
1.0000 | ORAL_TABLET | Freq: Every day | ORAL | 2 refills | Status: DC
Start: 2021-08-06 — End: 2022-03-04

## 2021-08-06 NOTE — Telephone Encounter (Signed)
Patient needs Ambien sent to [facility]

## 2021-08-14 ENCOUNTER — Other Ambulatory Visit: Payer: Self-pay | Admitting: Family Medicine

## 2021-08-14 DIAGNOSIS — F419 Anxiety disorder, unspecified: Secondary | ICD-10-CM

## 2021-08-14 MED ORDER — ALPRAZOLAM 0.25 MG PO TABS: 0.2500 mg | ORAL_TABLET | Freq: Two times a day (BID) | ORAL | 0 refills | Status: DC | PRN

## 2021-08-14 NOTE — Telephone Encounter (Signed)
Requesting: Alprazolam 0.25mg  Contract: 10/20/2019 UDS: 10/10/2019 Last Visit:  04/15/2021 Next Visit: None Last Refill: 06/28/21 #20 x 0RF  Please Advise

## 2021-09-04 ENCOUNTER — Telehealth (HOSPITAL_COMMUNITY): Payer: Self-pay

## 2021-09-04 MED ORDER — ZOLPIDEM TARTRATE 10 MG PO TABS: 10.0000 mg | ORAL_TABLET | Freq: Every evening | ORAL | 0 refills | Status: DC | PRN

## 2021-09-04 NOTE — Telephone Encounter (Signed)
Patient needs Ambien refill sent to [facility].

## 2021-09-06 ENCOUNTER — Other Ambulatory Visit: Payer: Self-pay | Admitting: Family Medicine

## 2021-09-06 DIAGNOSIS — F419 Anxiety disorder, unspecified: Secondary | ICD-10-CM

## 2021-09-06 MED ORDER — ALPRAZOLAM 0.25 MG PO TABS: 0.2500 mg | ORAL_TABLET | Freq: Two times a day (BID) | ORAL | 0 refills | Status: DC | PRN

## 2021-09-06 NOTE — Telephone Encounter (Signed)
Requesting: alprazolam 0.25mg  Contract: None UDS: 10/10/2019 Last Visit: 04/15/2021 Next Visit:  None Last Refill: 08/14/2021 #20 and 0RF  Please Advise

## 2021-09-10 ENCOUNTER — Encounter: Payer: Self-pay | Admitting: Family Medicine

## 2021-09-10 DIAGNOSIS — F32A Depression, unspecified: Secondary | ICD-10-CM

## 2021-09-10 MED ORDER — DESVENLAFAXINE SUCCINATE ER 100 MG PO TB24: 100.0000 mg | ORAL_TABLET | Freq: Every day | ORAL | 1 refills | Status: DC

## 2021-09-12 ENCOUNTER — Encounter (HOSPITAL_COMMUNITY): Payer: Self-pay | Admitting: Psychiatry

## 2021-09-12 ENCOUNTER — Ambulatory Visit (INDEPENDENT_AMBULATORY_CARE_PROVIDER_SITE_OTHER): Payer: BC Managed Care – PPO | Admitting: Psychiatry

## 2021-09-12 VITALS — BP 146/90 | Temp 98.5°F | Ht 62.75 in | Wt 180.0 lb

## 2021-09-12 DIAGNOSIS — F0630 Mood disorder due to known physiological condition, unspecified: Secondary | ICD-10-CM

## 2021-09-12 DIAGNOSIS — F411 Generalized anxiety disorder: Secondary | ICD-10-CM | POA: Diagnosis not present

## 2021-09-12 DIAGNOSIS — F5102 Adjustment insomnia: Secondary | ICD-10-CM | POA: Diagnosis not present

## 2021-09-12 MED ORDER — ZOLPIDEM TARTRATE 10 MG PO TABS: 10.0000 mg | ORAL_TABLET | Freq: Every evening | ORAL | 2 refills | Status: DC | PRN

## 2021-09-12 NOTE — Progress Notes (Signed)
[place] Follow up visit  Patient Identification: Katie [name] MRN:  [id number] Date of Evaluation:  09/12/2021 Referral Source: primary care Chief Complaint:  follow up insomnia   Visit Diagnosis:    ICD-10-CM   1. Mood disorder in conditions classified elsewhere  F06.30     2. GAD (generalized anxiety disorder)  F41.1     3. 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 Modifying factors ; son, camping  Duration since young age   She was seen at age 53 by her counselor after her father's death 1 time no medication given       Past Psychiatric History: insomnia, anxiety  Previous Psychotropic Medications: Yes  Zoloft, seroquel   Past Medical History:  Past Medical History:  Diagnosis Date   Allergy    Anemia    Anxiety    Blood transfusion without reported diagnosis    Hyperlipidemia    Hypertension    Migraine    PVC (premature ventricular contraction)    RLS (restless legs syndrome)    Thyroid disease     Past Surgical History:  Procedure Laterality Date   BUNIONECTOMY  06/04/2009   Right foot   CARPAL TUNNEL RELEASE     Bilateral   CRYOABLATION  11/04   [place]   ENDOMETRIAL ABLATION     SEPTOPLASTY  6/05   TUBAL LIGATION  04/1999   UPPER GASTROINTESTINAL ENDOSCOPY      Family Psychiatric History: Dad: committed suicide, see chart , Niece;  depression  Family History:  Family History  Problem Relation Age of Onset   Depression Father        Suicide   Aneurysm Mother        Brain   Hypertension Sister    Thyroid disease Sister    Diabetes Maternal Grandmother    Alzheimer's disease Maternal Grandmother    Uterine cancer Maternal Grandmother    Hypertension Maternal Grandmother    Osteoporosis Maternal Grandmother    Cancer Maternal Grandmother        uterine   Lung cancer Maternal Grandfather    Cancer Maternal Grandfather        lung   Cancer Paternal Grandmother        metatstatic-- ? primary colon   Cancer Paternal Grandfather        skin, hepatic   Colon cancer Paternal Grandfather        age unknown   Colon polyps Neg Hx    Rectal cancer Neg Hx    Stomach cancer Neg Hx    Esophageal cancer Neg Hx     Social History:   Social History   Socioeconomic History   Marital status: Married    Spouse name: Not on file   Number of children: 2   Years of education: 15   Highest education level: Not on file  Occupational History   Occupation: scheduler [employer]  Tobacco Use   Smoking status: Former    Packs/day: 1.00    Types: Cigarettes    Quit date: 01/14/1995    Years since quitting: 26.6   Smokeless tobacco: Never   Tobacco comments:    started smoking at age 50 1ppd  Vaping Use   Vaping Use: Never used  Substance and Sexual Activity   Alcohol use: Yes    Alcohol/week: 2.0 standard drinks    Types: 2 Cans of beer per week    Comment: Reports a couple of beers 1- 2 times a month   Drug use: No   Sexual activity: Not Currently    Partners: Male    Comment: 1st intercourse 56 yo-More than 5 partners-BTL  Other Topics Concern   Not on file  Social History Narrative   Exercise-- no   Social Determinants of Health   Financial Resource Strain: Not on file  Food Insecurity: Not on file  Transportation Needs: Not on file  Physical Activity: Not on file  Stress: Not on file  Social  Connections: Not on file     Allergies:   Allergies  Allergen Reactions   Morphine     Nausea & vomiting   Lisinopril     sweating    Metabolic Disorder Labs: Lab Results  Component Value Date   HGBA1C 6.5 05/13/2021   No results found for: PROLACTIN Lab Results  Component Value Date   CHOL 149 01/24/2021   TRIG 135.0 01/24/2021   HDL 52.00 01/24/2021   CHOLHDL 3 01/24/2021   VLDL 27.0 01/24/2021   LDLCALC 70 01/24/2021   LDLCALC 66 10/19/2020   Lab Results  Component Value Date   TSH 2.10 04/15/2021    Therapeutic Level Labs: No results found for: LITHIUM No results found for: CBMZ No results found for: VALPROATE  Current Medications: Current Outpatient Medications  Medication Sig Dispense Refill   ALPRAZolam (XANAX) 0.25 MG tablet Take 1 tablet (0.25 mg total) by mouth 2 (two) times daily as needed for anxiety. 20 tablet 0   amLODipine (NORVASC) 5 MG tablet TAKE 1 TABLET BY MOUTH DAILY 90 tablet 1   cetirizine (ZYRTEC) 10 MG tablet Take 1 tablet (10 mg total) by mouth daily.     desvenlafaxine (PRISTIQ) 100 MG 24 hr tablet Take 1 tablet (100 mg total) by mouth daily. 90 tablet 1   fenofibrate 160 MG tablet TAKE 1 TABLET BY MOUTH DAILY 90 tablet 1   ferrous fumarate (HEMOCYTE - 106 MG FE) 325 (106 Fe) MG TABS tablet Take 1 tablet (106 mg of iron total) by mouth daily. 30 tablet 2   levonorgestrel-ethinyl estradiol (ALESSE) 0.1-20 MG-MCG tablet Take 1 tablet by mouth daily. 84 tablet 2   levothyroxine (SYNTHROID) 150 MCG tablet Take 1 tablet (150 mcg total) by mouth daily. 90 tablet 0   rosuvastatin (CRESTOR) 10 MG tablet Take 1 tablet (10 mg total) by mouth daily. 90 tablet 1   valACYclovir (VALTREX) 1000 MG tablet Take 1 tablet (1,000 mg total) by mouth 3 (three) times daily. (Patient taking differently: Take 1,000 mg by mouth 3 (three) times daily. As needed.) 30 tablet 2   zolpidem (AMBIEN) 10 MG tablet Take 1 tablet (10 mg total) by mouth at bedtime as needed  for sleep. TAKE ONE AT NIGHT FOR SLEEP 30 tablet 2   No current facility-administered medications for this visit.      Psychiatric Specialty Exam: Review of  Systems  Cardiovascular:  Negative for palpitations.  Neurological:  Negative for tremors.  Psychiatric/Behavioral:  Negative for self-injury and suicidal ideas.    Blood pressure (!) 146/90, temperature 98.5 F (36.9 C), height 5' 2.75" (1.594 m), weight 180 lb (81.6 kg).Body mass index is 32.14 kg/m.  General Appearance: Casual  Eye Contact:  Fair  Speech:  Clear and Coherent  Volume:  Normal  Mood:  Euthymic  Affect:  Full Range  Thought Process:  Goal Directed  Orientation:  Full (Time, Place, and Person)  Thought Content:  Rumination  Suicidal Thoughts:  No  Homicidal Thoughts:  No  Memory:  Immediate;   Fair Recent;   Fair  Judgement:  Fair  Insight:  Fair  Psychomotor Activity:  Normal  Concentration:  Concentration: Fair and Attention Span: Fair  Recall:  [employer] of Knowledge:Good  Language: Good  Akathisia:  No  Handed:    AIMS (if indicated):  not done  Assets:  Desire for Improvement Financial Resources/Insurance Physical Health Social Support  ADL's:  Intact  Cognition: WNL  Sleep:  Poor   Screenings: PHQ2-9    Flowsheet Row Office Visit from 12/13/2020 in [place] Office Visit from 10/19/2020 in [facility] [place] Visit from 03/19/2017 in [place] [facility] [place] Visit from 06/22/2015 in Primary Care at [place] from 05/01/2015 in [facility] at [facility] Total Score 0 0 0 0 [place] Visit from 09/12/2021 in [place] Office Visit from 05/14/2021 in [place] Office Visit from 01/31/2021 in [place] No  Risk No Risk No Risk       Assessment and Plan: as follows  Prior documentation reviewed  Mood disorder not otherwise specified; doing fair continue pristiq   Insomnia; Lorrin Mais helps, works on sleep hygiene  Generalized anxiety disorder; manageable on pristiz   Direct care time 15 min including face to face    fu23m   Follow-up in 27m  Merian Capron, MD 1/19/20233:19 PM

## 2021-10-08 ENCOUNTER — Telehealth: Payer: Self-pay | Admitting: Family Medicine

## 2021-10-08 DIAGNOSIS — F419 Anxiety disorder, unspecified: Secondary | ICD-10-CM

## 2021-10-09 ENCOUNTER — Other Ambulatory Visit: Payer: Self-pay | Admitting: Family Medicine

## 2021-10-09 DIAGNOSIS — F419 Anxiety disorder, unspecified: Secondary | ICD-10-CM

## 2021-10-09 MED ORDER — ALPRAZOLAM 0.25 MG PO TABS: 0.2500 mg | ORAL_TABLET | Freq: Two times a day (BID) | ORAL | 0 refills | Status: DC | PRN

## 2021-10-09 NOTE — Telephone Encounter (Signed)
Last RX:09-06-21 Last OV:04-15-21 Next OV: none scheduled UDS:10-10-19 CSC:10-10-19

## 2021-10-10 ENCOUNTER — Other Ambulatory Visit: Payer: Self-pay | Admitting: Family Medicine

## 2021-10-10 DIAGNOSIS — F419 Anxiety disorder, unspecified: Secondary | ICD-10-CM

## 2021-10-10 MED ORDER — ALPRAZOLAM 0.25 MG PO TABS: 0.2500 mg | ORAL_TABLET | Freq: Two times a day (BID) | ORAL | 0 refills | Status: DC | PRN

## 2021-10-23 ENCOUNTER — Other Ambulatory Visit: Payer: Self-pay | Admitting: Family Medicine

## 2021-10-23 MED ORDER — LEVOTHYROXINE SODIUM 150 MCG PO TABS: 150.0000 ug | ORAL_TABLET | Freq: Every day | ORAL | 0 refills | Status: DC

## 2021-10-29 ENCOUNTER — Encounter: Payer: Self-pay | Admitting: Family Medicine

## 2021-10-29 DIAGNOSIS — E785 Hyperlipidemia, unspecified: Secondary | ICD-10-CM

## 2021-10-30 MED ORDER — FENOFIBRATE 160 MG PO TABS: 160.0000 mg | ORAL_TABLET | Freq: Every day | ORAL | 0 refills | Status: DC

## 2021-11-01 ENCOUNTER — Ambulatory Visit: Payer: BC Managed Care – PPO | Admitting: Family Medicine

## 2021-11-01 ENCOUNTER — Encounter: Payer: Self-pay | Admitting: Family Medicine

## 2021-11-01 VITALS — BP 144/82 | HR 75 | Temp 98.7°F | Resp 16 | Ht 62.75 in | Wt 180.2 lb

## 2021-11-01 DIAGNOSIS — E785 Hyperlipidemia, unspecified: Secondary | ICD-10-CM

## 2021-11-01 DIAGNOSIS — I10 Essential (primary) hypertension: Secondary | ICD-10-CM

## 2021-11-01 DIAGNOSIS — E063 Autoimmune thyroiditis: Secondary | ICD-10-CM | POA: Diagnosis not present

## 2021-11-01 DIAGNOSIS — E039 Hypothyroidism, unspecified: Secondary | ICD-10-CM

## 2021-11-01 MED ORDER — FENOFIBRATE 160 MG PO TABS: 160.0000 mg | ORAL_TABLET | Freq: Every day | ORAL | 1 refills | Status: DC

## 2021-11-01 NOTE — Assessment & Plan Note (Signed)
Encourage heart healthy diet such as MIND or DASH diet, increase exercise, avoid trans fats, simple carbohydrates and processed foods, consider a krill or fish or flaxseed oil cap daily.  °

## 2021-11-01 NOTE — Patient Instructions (Signed)
DASH Eating Plan °DASH stands for Dietary Approaches to Stop Hypertension. The DASH eating plan is a healthy eating plan that has been shown to: °Reduce high blood pressure (hypertension). °Reduce your risk for type 2 diabetes, heart disease, and stroke. °Help with weight loss. °What are tips for following this plan? °Reading food labels °Check food labels for the amount of salt (sodium) per serving. Choose foods with less than 5 percent of the Daily Value of sodium. Generally, foods with less than 300 milligrams (mg) of sodium per serving fit into this eating plan. °To find whole grains, look for the word "whole" as the first word in the ingredient list. °Shopping °Buy products labeled as "low-sodium" or "no salt added." °Buy fresh foods. Avoid canned foods and pre-made or frozen meals. °Cooking °Avoid adding salt when cooking. Use salt-free seasonings or herbs instead of table salt or sea salt. Check with your health care provider or pharmacist before using salt substitutes. °Do not fry foods. Cook foods using healthy methods such as baking, boiling, grilling, roasting, and broiling instead. °Cook with heart-healthy oils, such as olive, canola, avocado, soybean, or sunflower oil. °Meal planning ° °Eat a balanced diet that includes: °4 or more servings of fruits and 4 or more servings of vegetables each day. Try to fill one-half of your plate with fruits and vegetables. °6-8 servings of whole grains each day. °Less than 6 oz (170 g) of lean meat, poultry, or fish each day. A 3-oz (85-g) serving of meat is about the same size as a deck of cards. One egg equals 1 oz (28 g). °2-3 servings of low-fat dairy each day. One serving is 1 cup (237 mL). °1 serving of nuts, seeds, or beans 5 times each week. °2-3 servings of heart-healthy fats. Healthy fats called omega-3 fatty acids are found in foods such as walnuts, flaxseeds, fortified milks, and eggs. These fats are also found in cold-water fish, such as sardines, salmon,  and mackerel. °Limit how much you eat of: °Canned or prepackaged foods. °Food that is high in trans fat, such as some fried foods. °Food that is high in saturated fat, such as fatty meat. °Desserts and other sweets, sugary drinks, and other foods with added sugar. °Full-fat dairy products. °Do not salt foods before eating. °Do not eat more than 4 egg yolks a week. °Try to eat at least 2 vegetarian meals a week. °Eat more home-cooked food and less restaurant, buffet, and fast food. °Lifestyle °When eating at a restaurant, ask that your food be prepared with less salt or no salt, if possible. °If you drink alcohol: °Limit how much you use to: °0-1 drink a day for women who are not pregnant. °0-2 drinks a day for men. °Be aware of how much alcohol is in your drink. In the [place] one drink equals one 12 oz bottle of beer (355 mL), one 5 oz glass of wine (148 mL), or one 1½ oz glass of hard liquor (44 mL). °General information °Avoid eating more than 2,300 mg of salt a day. If you have hypertension, you may need to reduce your sodium intake to 1,500 mg a day. °Work with your health care provider to maintain a healthy body weight or to lose weight. Ask what an ideal weight is for you. °Get at least 30 minutes of exercise that causes your heart to beat faster (aerobic exercise) most days of the week. Activities may include walking, swimming, or biking. °Work with your health care provider or dietitian to   adjust your eating plan to your individual calorie needs. °What foods should I eat? °Fruits °All fresh, dried, or frozen fruit. Canned fruit in natural juice (without added sugar). °Vegetables °Fresh or frozen vegetables (raw, steamed, roasted, or grilled). Low-sodium or reduced-sodium tomato and vegetable juice. Low-sodium or reduced-sodium tomato sauce and tomato paste. Low-sodium or reduced-sodium canned vegetables. °Grains °Whole-grain or whole-wheat bread. Whole-grain or whole-wheat pasta. Brown rice. Oatmeal. Quinoa.  Bulgur. Whole-grain and low-sodium cereals. Pita bread. Low-fat, low-sodium crackers. Whole-wheat flour tortillas. °Meats and other proteins °Skinless chicken or turkey. Ground chicken or turkey. Pork with fat trimmed off. Fish and seafood. Egg whites. Dried beans, peas, or lentils. Unsalted nuts, nut butters, and seeds. Unsalted canned beans. Lean cuts of beef with fat trimmed off. Low-sodium, lean precooked or cured meat, such as sausages or meat loaves. °Dairy °Low-fat (1%) or fat-free (skim) milk. Reduced-fat, low-fat, or fat-free cheeses. Nonfat, low-sodium ricotta or cottage cheese. Low-fat or nonfat yogurt. Low-fat, low-sodium cheese. °Fats and oils °Soft margarine without trans fats. Vegetable oil. Reduced-fat, low-fat, or light mayonnaise and salad dressings (reduced-sodium). Canola, safflower, olive, avocado, soybean, and sunflower oils. Avocado. °Seasonings and condiments °Herbs. Spices. Seasoning mixes without salt. °Other foods °Unsalted popcorn and pretzels. Fat-free sweets. °The items listed above may not be a complete list of foods and beverages you can eat. Contact a dietitian for more information. °What foods should I avoid? °Fruits °Canned fruit in a light or heavy syrup. Fried fruit. Fruit in cream or butter sauce. °Vegetables °Creamed or fried vegetables. Vegetables in a cheese sauce. Regular canned vegetables (not low-sodium or reduced-sodium). Regular canned tomato sauce and paste (not low-sodium or reduced-sodium). Regular tomato and vegetable juice (not low-sodium or reduced-sodium). Pickles. Olives. °Grains °Baked goods made with fat, such as croissants, muffins, or some breads. Dry pasta or rice meal packs. °Meats and other proteins °Fatty cuts of meat. Ribs. Fried meat. Bacon. Bologna, salami, and other precooked or cured meats, such as sausages or meat loaves. Fat from the back of a pig (fatback). Bratwurst. Salted nuts and seeds. Canned beans with added salt. Canned or smoked fish.  Whole eggs or egg yolks. Chicken or turkey with skin. °Dairy °Whole or 2% milk, cream, and half-and-half. Whole or full-fat cream cheese. Whole-fat or sweetened yogurt. Full-fat cheese. Nondairy creamers. Whipped toppings. Processed cheese and cheese spreads. °Fats and oils °Butter. Stick margarine. Lard. Shortening. Ghee. Bacon fat. Tropical oils, such as coconut, palm kernel, or palm oil. °Seasonings and condiments °Onion salt, garlic salt, seasoned salt, table salt, and sea salt. Worcestershire sauce. Tartar sauce. Barbecue sauce. Teriyaki sauce. Soy sauce, including reduced-sodium. Steak sauce. Canned and packaged gravies. Fish sauce. Oyster sauce. Cocktail sauce. Store-bought horseradish. Ketchup. Mustard. Meat flavorings and tenderizers. Bouillon cubes. Hot sauces. Pre-made or packaged marinades. Pre-made or packaged taco seasonings. Relishes. Regular salad dressings. °Other foods °Salted popcorn and pretzels. °The items listed above may not be a complete list of foods and beverages you should avoid. Contact a dietitian for more information. °Where to find more information °National Heart, Lung, and Blood Institute: www.nhlbi.nih.gov °American Heart Association: www.heart.org °Academy of Nutrition and Dietetics: www.eatright.org °National Kidney Foundation: www.kidney.org °Summary °The DASH eating plan is a healthy eating plan that has been shown to reduce high blood pressure (hypertension). It may also reduce your risk for type 2 diabetes, heart disease, and stroke. °When on the DASH eating plan, aim to eat more fresh fruits and vegetables, whole grains, lean proteins, low-fat dairy, and heart-healthy fats. °With the DASH   eating plan, you should limit salt (sodium) intake to 2,300 mg a day. If you have hypertension, you may need to reduce your sodium intake to 1,500 mg a day. °Work with your health care provider or dietitian to adjust your eating plan to your individual calorie needs. °This information is not  intended to replace advice given to you by your health care provider. Make sure you discuss any questions you have with your health care provider. °Document Revised: 07/15/2019 Document Reviewed: 07/15/2019 °Elsevier Patient Education © 2022 [employer] Inc. ° °

## 2021-11-01 NOTE — Assessment & Plan Note (Signed)
Well controlled, no changes to meds. Encouraged heart healthy diet such as the DASH diet and exercise as tolerated.  °

## 2021-11-01 NOTE — Progress Notes (Signed)
Subjective:   By signing my name below, I, Zite Okoli, attest that this documentation has been prepared under the direction and in the presence of [name], DO. 11/01/2021    Patient ID: Katie [name], female    DOB: [birth year], 56 y.o.   MRN: [id number]  Chief Complaint  Patient presents with   Hypertension   Hypothyroidism   Hyperlipidemia   Follow-up    HPI Patient is in today for an office visit.  She is doing well at this time.  She reports her chest pains have been more frequent because she has been having more panic attacks, 10 mg Ambien has been helping her to sleep through the night and she only wakes up rarely. She does not think 100 mg pristiq is working for her.  She adds that her weight has been fluctuating.  She is requesting for a refill on 160 mg fenofibrate.  Past Medical History:  Diagnosis Date   Allergy    Anemia    Anxiety    Blood transfusion without reported diagnosis    Hyperlipidemia    Hypertension    Migraine    PVC (premature ventricular contraction)    RLS (restless legs syndrome)    Thyroid disease     Past Surgical History:  Procedure Laterality Date   BUNIONECTOMY  06/04/2009   Right foot   CARPAL TUNNEL RELEASE     Bilateral   CRYOABLATION  11/04   [place]   ENDOMETRIAL ABLATION     SEPTOPLASTY  6/05   TUBAL LIGATION  04/1999   UPPER GASTROINTESTINAL ENDOSCOPY      Family History  Problem Relation Age of Onset   Depression Father        Suicide   Aneurysm Mother        Brain   Hypertension Sister    Thyroid disease Sister    Diabetes Maternal Grandmother    Alzheimer's disease Maternal Grandmother    Uterine cancer Maternal Grandmother    Hypertension Maternal Grandmother    Osteoporosis Maternal Grandmother    Cancer Maternal Grandmother        uterine   Lung cancer Maternal Grandfather    Cancer Maternal Grandfather        lung   Cancer Paternal Grandmother         metatstatic-- ? 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[employer]  Tobacco Use   Smoking status: Former    Packs/day: 1.00    Types: Cigarettes    Quit date: 01/14/1995    Years since quitting: 26.8   Smokeless tobacco: Never   Tobacco comments:    started smoking at age 81 1ppd  Vaping Use   Vaping Use: Never used  Substance and Sexual Activity   Alcohol use: Yes    Alcohol/week: 2.0 standard drinks    Types: 2 Cans of beer  per week    Comment: Reports a couple of beers 1- 2 times a month   Drug use: No   Sexual activity: Not Currently    Partners: Male    Comment: 1st intercourse 56 yo-More than 5 partners-BTL  Other Topics Concern   Not on file  Social History Narrative   Exercise-- no   Social Determinants of Health   Financial Resource Strain: Not on file  Food Insecurity: Not on file  Transportation Needs: Not on file  Physical Activity: Not on file  Stress: Not on file  Social Connections: Not on file  Intimate Partner Violence: Not on file    Outpatient Medications Prior to Visit  Medication Sig Dispense Refill   ALPRAZolam (XANAX) 0.25 MG tablet Take 1 tablet (0.25 mg total) by mouth 2 (two) times daily as needed for anxiety. 20 tablet 0   amLODipine (NORVASC) 5 MG tablet TAKE 1 TABLET BY MOUTH DAILY 90 tablet 1   cetirizine (ZYRTEC) 10 MG tablet Take 1 tablet (10 mg total) by mouth daily.     desvenlafaxine (PRISTIQ) 100 MG 24 hr tablet Take 1 tablet (100 mg total) by mouth daily. 90 tablet 1   ferrous fumarate (HEMOCYTE - 106 MG FE) 325  (106 Fe) MG TABS tablet Take 1 tablet (106 mg of iron total) by mouth daily. 30 tablet 2   levonorgestrel-ethinyl estradiol (ALESSE) 0.1-20 MG-MCG tablet Take 1 tablet by mouth daily. 84 tablet 2   levothyroxine (SYNTHROID) 150 MCG tablet Take 1 tablet (150 mcg total) by mouth daily. 90 tablet 0   rosuvastatin (CRESTOR) 10 MG tablet Take 1 tablet (10 mg total) by mouth daily. 90 tablet 1   valACYclovir (VALTREX) 1000 MG tablet Take 1 tablet (1,000 mg total) by mouth 3 (three) times daily. (Patient taking differently: Take 1,000 mg by mouth 3 (three) times daily. As needed.) 30 tablet 2   fenofibrate 160 MG tablet Take 1 tablet (160 mg total) by mouth daily. 30 tablet 0   zolpidem (AMBIEN) 10 MG tablet Take 1 tablet (10 mg total) by mouth at bedtime as needed for sleep. TAKE ONE AT NIGHT FOR SLEEP 30 tablet 2   No facility-administered medications prior to visit.    Allergies  Allergen Reactions   Morphine     Nausea & vomiting   Lisinopril     sweating    Review of Systems  Constitutional:  Negative for fever.  HENT:  Negative for congestion, ear pain, hearing loss, sinus pain and sore throat.   Eyes:  Negative for blurred vision and pain.  Respiratory:  Negative for cough, sputum production, shortness of breath and wheezing.   Cardiovascular:  Positive for chest pain. Negative for palpitations.  Gastrointestinal:  Negative for blood in stool, constipation, diarrhea, nausea and vomiting.  Genitourinary:  Negative for dysuria, frequency, hematuria and urgency.  Musculoskeletal:  Negative for back pain, falls and myalgias.  Neurological:  Negative for dizziness, sensory change, loss of consciousness, weakness and headaches.  Endo/Heme/Allergies:  Negative for environmental allergies. Does not bruise/bleed easily.  Psychiatric/Behavioral:  Negative for depression and suicidal ideas. The patient is not nervous/anxious and does not have insomnia.       Objective:    Physical  Exam Constitutional:      General: She is not in acute distress.    Appearance: Normal appearance. She is not ill-appearing.  HENT:     Head: Normocephalic and atraumatic.     Right Ear: External ear normal.  Left Ear: External ear normal.  Eyes:     Extraocular Movements: Extraocular movements intact.     Pupils: Pupils are equal, round, and reactive to light.  Cardiovascular:     Rate and Rhythm: Normal rate and regular rhythm.     Pulses: Normal pulses.     Heart sounds: Normal heart sounds. No murmur heard.   No gallop.  Pulmonary:     Effort: Pulmonary effort is normal. No respiratory distress.     Breath sounds: Normal breath sounds. No wheezing, rhonchi or rales.  Abdominal:     General: Bowel sounds are normal. There is no distension.     Palpations: Abdomen is soft. There is no mass.     Tenderness: There is no abdominal tenderness. There is no guarding or rebound.     Hernia: No hernia is present.  Musculoskeletal:     Cervical back: Normal range of motion and neck supple.  Lymphadenopathy:     Cervical: No cervical adenopathy.  Skin:    General: Skin is warm and dry.  Neurological:     Mental Status: She is alert and oriented to person, place, and time.  Psychiatric:        Behavior: Behavior normal.    BP (!) 144/82 (BP Location: Left Arm, Patient Position: Sitting, Cuff Size: Normal)    Pulse 75    Temp 98.7 F (37.1 C) (Oral)    Resp 16    Ht 5' 2.75" (1.594 m)    Wt 180 lb 3.2 oz (81.7 kg)    SpO2 98%    BMI 32.18 kg/m  Wt Readings from Last 3 Encounters:  11/01/21 180 lb 3.2 oz (81.7 kg)  04/25/21 179 lb (81.2 kg)  04/15/21 179 lb 12.8 oz (81.6 kg)    Diabetic Foot Exam - Simple   No data filed    Lab Results  Component Value Date   WBC 4.7 05/13/2021   HGB 10.2 (L) 05/13/2021   HCT 33.2 (L) 05/13/2021   PLT 517.0 (H) 05/13/2021   GLUCOSE 109 (H) 05/13/2021   CHOL 149 01/24/2021   TRIG 135.0 01/24/2021   HDL 52.00 01/24/2021   LDLDIRECT  62.0 04/09/2020   LDLCALC 70 01/24/2021   ALT 12 05/13/2021   AST 16 05/13/2021   NA 137 05/13/2021   K 5.0 05/13/2021   CL 101 05/13/2021   CREATININE 0.77 05/13/2021   BUN 13 05/13/2021   CO2 26 05/13/2021   TSH 2.10 04/15/2021   HGBA1C 6.5 05/13/2021   MICROALBUR 1.2 10/19/2020    Lab Results  Component Value Date   TSH 2.10 04/15/2021   Lab Results  Component Value Date   WBC 4.7 05/13/2021   HGB 10.2 (L) 05/13/2021   HCT 33.2 (L) 05/13/2021   MCV 72.1 (L) 05/13/2021   PLT 517.0 (H) 05/13/2021   Lab Results  Component Value Date   NA 137 05/13/2021   K 5.0 05/13/2021   CO2 26 05/13/2021   GLUCOSE 109 (H) 05/13/2021   BUN 13 05/13/2021   CREATININE 0.77 05/13/2021   BILITOT 0.5 05/13/2021   ALKPHOS 50 05/13/2021   AST 16 05/13/2021   ALT 12 05/13/2021   PROT 7.5 05/13/2021   ALBUMIN 4.3 05/13/2021   CALCIUM 9.6 05/13/2021   GFR 88.30 05/13/2021   Lab Results  Component Value Date   CHOL 149 01/24/2021   Lab Results  Component Value Date   HDL 52.00 01/24/2021   Lab Results  Component Value  Date   LDLCALC 70 01/24/2021   Lab Results  Component Value Date   TRIG 135.0 01/24/2021   Lab Results  Component Value Date   CHOLHDL 3 01/24/2021   Lab Results  Component Value Date   HGBA1C 6.5 05/13/2021       Assessment & Plan:   Problem List Items Addressed This Visit       Unprioritized   Hyperlipidemia LDL goal <100   Relevant Medications   fenofibrate 160 MG tablet   Other Relevant Orders   CBC with Differential/Platelet   Comprehensive metabolic panel   Lipid panel   Hypothyroidism - Primary   Relevant Orders   TSH   Hashimoto's thyroiditis    Check labs  con't meds       HTN (hypertension)    Well controlled, no changes to meds. Encouraged heart healthy diet such as the DASH diet and exercise as tolerated.       Relevant Medications   fenofibrate 160 MG tablet   Other Relevant Orders   CBC with Differential/Platelet    Comprehensive metabolic panel   Lipid panel   Hyperlipidemia    Encourage heart healthy diet such as MIND or DASH diet, increase exercise, avoid trans fats, simple carbohydrates and processed foods, consider a krill or fish or flaxseed oil cap daily.       Relevant Medications   fenofibrate 160 MG tablet    Meds ordered this encounter  Medications   fenofibrate 160 MG tablet    Sig: Take 1 tablet (160 mg total) by mouth daily.    Dispense:  90 tablet    Refill:  1    I,Zite Okoli,acting as a Education administrator for [employer], DO.,have documented all relevant documentation on the behalf of [name], DO,as directed by  [name], DO while in the presence of [name], DO.   I, [name], DO. , personally preformed the services described in this documentation.  All medical record entries made by the scribe were at my direction and in my presence.  I have reviewed the chart and discharge instructions (if applicable) and agree that the record reflects my personal performance and is accurate and complete. 11/01/2021

## 2021-11-01 NOTE — Assessment & Plan Note (Signed)
Check labs con't meds 

## 2021-11-02 LAB — COMPREHENSIVE METABOLIC PANEL
AG Ratio: 1.4 (calc) (ref 1.0–2.5)
ALT: 10 U/L (ref 6–29)
AST: 15 U/L (ref 10–35)
Albumin: 4.1 g/dL (ref 3.6–5.1)
Alkaline phosphatase (APISO): 55 U/L (ref 37–153)
BUN: 11 mg/dL (ref 7–25)
CO2: 22 mmol/L (ref 20–32)
Calcium: 9.2 mg/dL (ref 8.6–10.4)
Chloride: 101 mmol/L (ref 98–110)
Creat: 0.67 mg/dL (ref 0.50–1.03)
Globulin: 3 g/dL (calc) (ref 1.9–3.7)
Glucose, Bld: 72 mg/dL (ref 65–99)
Potassium: 4.9 mmol/L (ref 3.5–5.3)
Sodium: 135 mmol/L (ref 135–146)
Total Bilirubin: 0.3 mg/dL (ref 0.2–1.2)
Total Protein: 7.1 g/dL (ref 6.1–8.1)

## 2021-11-02 LAB — CBC WITH DIFFERENTIAL/PLATELET
Absolute Monocytes: 504 cells/uL (ref 200–950)
Basophils Absolute: 30 cells/uL (ref 0–200)
Basophils Relative: 0.5 %
Eosinophils Absolute: 192 cells/uL (ref 15–500)
Eosinophils Relative: 3.2 %
HCT: 33.8 % — ABNORMAL LOW (ref 35.0–45.0)
Hemoglobin: 10.8 g/dL — ABNORMAL LOW (ref 11.7–15.5)
Lymphs Abs: 3012 cells/uL (ref 850–3900)
MCH: 25.7 pg — ABNORMAL LOW (ref 27.0–33.0)
MCHC: 32 g/dL (ref 32.0–36.0)
MCV: 80.3 fL (ref 80.0–100.0)
MPV: 9.3 fL (ref 7.5–12.5)
Monocytes Relative: 8.4 %
Neutro Abs: 2262 cells/uL (ref 1500–7800)
Neutrophils Relative %: 37.7 %
Platelets: 498 10*3/uL — ABNORMAL HIGH (ref 140–400)
RBC: 4.21 10*6/uL (ref 3.80–5.10)
RDW: 15.3 % — ABNORMAL HIGH (ref 11.0–15.0)
Total Lymphocyte: 50.2 %
WBC: 6 10*3/uL (ref 3.8–10.8)

## 2021-11-02 LAB — LIPID PANEL
Cholesterol: 149 mg/dL (ref <200)
HDL: 52 mg/dL (ref >=50)
LDL Cholesterol (Calc): 75 mg/dL (calc)
Non-HDL Cholesterol (Calc): 97 mg/dL (calc) (ref <130)
Total CHOL/HDL Ratio: 2.9 (calc) (ref <5.0)
Triglycerides: 139 mg/dL (ref <150)

## 2021-11-02 LAB — TSH: TSH: 2.16 mIU/L

## 2021-11-04 ENCOUNTER — Other Ambulatory Visit: Payer: Self-pay | Admitting: Family Medicine

## 2021-11-04 ENCOUNTER — Encounter: Payer: Self-pay | Admitting: Family Medicine

## 2021-11-04 DIAGNOSIS — F419 Anxiety disorder, unspecified: Secondary | ICD-10-CM

## 2021-11-04 DIAGNOSIS — I10 Essential (primary) hypertension: Secondary | ICD-10-CM

## 2021-11-04 DIAGNOSIS — D649 Anemia, unspecified: Secondary | ICD-10-CM

## 2021-11-04 MED ORDER — ALPRAZOLAM 0.25 MG PO TABS: 0.2500 mg | ORAL_TABLET | Freq: Two times a day (BID) | ORAL | 0 refills | Status: DC | PRN

## 2021-11-04 MED ORDER — AMLODIPINE BESYLATE 5 MG PO TABS: 5.0000 mg | ORAL_TABLET | Freq: Every day | ORAL | 1 refills | Status: DC

## 2021-11-04 NOTE — Telephone Encounter (Signed)
Requesting: alprazolam 0.'25mg'$   ?Contract: None ?UDS: 10/10/2019 ?Last Visit: 11/01/2021  ?Next Visit: None ?Last Refill: 10/10/2021 #20 and 0RF ? ?Please Advise ? ?

## 2021-11-06 ENCOUNTER — Ambulatory Visit: Payer: BC Managed Care – PPO

## 2021-11-08 ENCOUNTER — Telehealth: Payer: Self-pay

## 2021-11-08 ENCOUNTER — Ambulatory Visit (INDEPENDENT_AMBULATORY_CARE_PROVIDER_SITE_OTHER): Payer: BC Managed Care – PPO

## 2021-11-08 DIAGNOSIS — Z1379 Encounter for other screening for genetic and chromosomal anomalies: Secondary | ICD-10-CM

## 2021-11-08 DIAGNOSIS — F32A Depression, unspecified: Secondary | ICD-10-CM | POA: Diagnosis not present

## 2021-11-08 DIAGNOSIS — F419 Anxiety disorder, unspecified: Secondary | ICD-10-CM | POA: Diagnosis not present

## 2021-11-08 NOTE — Progress Notes (Addendum)
Pt presents today for [employer] genetic testing. Consent form signed, cheeks swabbed and collected. Envelope placed in mail bin for pick up. ?

## 2021-11-08 NOTE — Telephone Encounter (Signed)
Pt was seen in office today. [facility] test was collected/ordered. Sample was packaged and placed up front for [facility] to pick up Monday 11/11/2021 between 12 pm - 1:30 pm. Consent and insurance info were in the package.  ? ?Confirmation number for pickup is [id number]. ? ?

## 2021-11-11 NOTE — Telephone Encounter (Signed)
[facility] failed to come between the hours of 12 and 1: 30 PM.  ? ?New confirmation: [id number] for pick up today 4:00 PM - 5:00 PM.  ?

## 2021-11-18 DIAGNOSIS — Z1231 Encounter for screening mammogram for malignant neoplasm of breast: Secondary | ICD-10-CM | POA: Diagnosis not present

## 2021-11-28 ENCOUNTER — Other Ambulatory Visit: Payer: Self-pay | Admitting: Family Medicine

## 2021-11-28 DIAGNOSIS — F419 Anxiety disorder, unspecified: Secondary | ICD-10-CM

## 2021-11-28 MED ORDER — ALPRAZOLAM 0.25 MG PO TABS: 0.2500 mg | ORAL_TABLET | Freq: Two times a day (BID) | ORAL | 0 refills | Status: DC | PRN

## 2021-11-28 NOTE — Telephone Encounter (Signed)
Requesting: alprazolam ?Contract: 10/10/19 ?UDS:10/10/19 ?Last Visit: 11/01/21 ?Next Visit: none ?Last Refill: 11/04/21 ? ?Please Advise ? ?

## 2021-11-29 ENCOUNTER — Encounter: Payer: Self-pay | Admitting: Family Medicine

## 2021-12-09 ENCOUNTER — Encounter: Payer: Self-pay | Admitting: Family Medicine

## 2021-12-27 ENCOUNTER — Other Ambulatory Visit: Payer: Self-pay | Admitting: Family Medicine

## 2021-12-27 DIAGNOSIS — F419 Anxiety disorder, unspecified: Secondary | ICD-10-CM

## 2021-12-27 MED ORDER — ALPRAZOLAM 0.25 MG PO TABS: 0.2500 mg | ORAL_TABLET | Freq: Two times a day (BID) | ORAL | 0 refills | Status: DC | PRN

## 2021-12-27 NOTE — Telephone Encounter (Signed)
[place] Pt ? ?Requesting: alprazolam 0.'25mg'$   ?Contract: None ?UDS: 10/10/19 ?Last Visit: 11/01/21 ?Next Visit: None ?Last Refill: 11/28/21 #20 and 0RF ? ?Please Advise ? ?

## 2022-01-07 ENCOUNTER — Ambulatory Visit (HOSPITAL_COMMUNITY): Payer: BC Managed Care – PPO | Admitting: Psychiatry

## 2022-01-07 ENCOUNTER — Ambulatory Visit (INDEPENDENT_AMBULATORY_CARE_PROVIDER_SITE_OTHER): Payer: BC Managed Care – PPO | Admitting: Psychiatry

## 2022-01-07 ENCOUNTER — Encounter (HOSPITAL_COMMUNITY): Payer: Self-pay | Admitting: Psychiatry

## 2022-01-07 VITALS — BP 124/78 | HR 81 | Temp 97.8°F | Ht 63.0 in | Wt 180.0 lb

## 2022-01-07 DIAGNOSIS — F411 Generalized anxiety disorder: Secondary | ICD-10-CM | POA: Diagnosis not present

## 2022-01-07 DIAGNOSIS — F0630 Mood disorder due to known physiological condition, unspecified: Secondary | ICD-10-CM | POA: Diagnosis not present

## 2022-01-07 DIAGNOSIS — F5102 Adjustment insomnia: Secondary | ICD-10-CM | POA: Diagnosis not present

## 2022-01-07 MED ORDER — ZOLPIDEM TARTRATE 10 MG PO TABS
10.0000 mg | ORAL_TABLET | Freq: Every evening | ORAL | 2 refills | Status: DC | PRN
Start: 2022-01-07 — End: 2022-04-30

## 2022-01-07 NOTE — Progress Notes (Signed)
[place] Follow up visit ? ?Patient Identification: Katie [name] ?MRN:  [id number] ?Date of Evaluation:  01/07/2022 ?Referral Source: primary care ?Chief Complaint:  follow up insomnia ? ? ?Visit Diagnosis:  ?  ICD-10-CM   ?1. Mood disorder in conditions classified elsewhere  F06.30   ?  ?2. GAD (generalized anxiety disorder)  F41.1   ?  ?3. Adjustment insomnia  F51.02   ?  ? ? 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 ?Patient has suffered from insomnia since young age    ? ?Doing fair on ambien, sleep reasonable, gets small dose of xanax from primary care ? ?Talks to sons when she can, the youngest one ?Denies depression  ? ? ?Aggravating factors; job stress  Distant relationship with one of her son. ?Modifying factors ; son, camping ? ?Duration since young age ?Severity manageable ? ?She was seen at age 84 by her counselor after her father's death 1 time no medication given ? ? ? ? ? ? ?Past Psychiatric History: insomnia, anxiety ? ?Previous Psychotropic Medications: Yes  ?Zoloft, seroquel ? ? ?Past Medical History:  ?Past Medical History:  ?Diagnosis Date  ? Allergy   ? Anemia   ? Anxiety   ? Blood transfusion without reported diagnosis   ? Hyperlipidemia   ? Hypertension   ? Migraine   ? PVC (premature ventricular contraction)   ? RLS (restless legs syndrome)   ? Thyroid disease   ?  ?Past Surgical History:  ?Procedure Laterality Date  ? BUNIONECTOMY  06/04/2009  ? Right foot  ? CARPAL TUNNEL RELEASE    ? Bilateral  ? CRYOABLATION  11/04  ? ECTOPIC PREGNANCY SURGERY  1998  ? ENDOMETRIAL ABLATION    ? SEPTOPLASTY  6/05  ? TUBAL LIGATION  04/1999  ? UPPER GASTROINTESTINAL ENDOSCOPY    ? ? ?Family Psychiatric History: Dad: committed suicide, see chart , Niece;  depression ? ?Family History:  ?Family History  ?Problem Relation Age of Onset  ? Depression Father   ?     Suicide  ? Aneurysm Mother   ?     Brain  ? Hypertension Sister   ? Thyroid disease Sister   ? Diabetes Maternal Grandmother   ? Alzheimer's disease Maternal Grandmother   ? Uterine cancer Maternal Grandmother   ? Hypertension Maternal Grandmother   ? Osteoporosis Maternal Grandmother   ? Cancer Maternal Grandmother   ?     uterine  ? Lung cancer Maternal Grandfather   ? Cancer Maternal Grandfather   ?     lung  ? Cancer Paternal Grandmother   ?     metatstatic-- ? primary colon  ? Cancer Paternal Grandfather   ?     skin, hepatic  ? Colon cancer Paternal Grandfather   ?     age unknown  ? Colon polyps Neg Hx   ? Rectal cancer Neg Hx   ? Stomach cancer Neg Hx   ? Esophageal cancer Neg Hx   ? ? ?Social History:   ?Social History  ? ?Socioeconomic History  ? Marital status: Married  ?  Spouse name: Not on file  ? Number of children: 2  ? Years of education: 16  ? Highest education level: Not on file  ?  Occupational History  ? Occupation: scheduler [employer]  ?Tobacco Use  ? Smoking status: Former  ?  Packs/day: 1.00  ?  Types: Cigarettes  ?  Quit date: 01/14/1995  ?  Years since quitting: 27.0  ? Smokeless tobacco: Never  ? Tobacco comments:  ?  started smoking at age 74 1ppd  ?Vaping Use  ? Vaping Use: Never used  ?Substance and Sexual Activity  ? Alcohol use: Yes  ?  Alcohol/week: 2.0 standard drinks  ?  Types: 2 Cans of beer per week  ?  Comment: Reports a couple of beers 1- 2 times a month  ? Drug use: No  ? Sexual activity: Not Currently  ?  Partners: Male  ?  Comment: 1st intercourse 56 yo-More than 5 partners-BTL  ?Other Topics Concern  ? Not on file  ?Social History Narrative  ? Exercise-- no  ? ?Social Determinants of Health  ? ?Financial Resource Strain: Not on file  ?Food Insecurity: Not on file  ?Transportation Needs: Not on file  ?Physical Activity: Not on file  ?Stress: Not on file  ?Social  Connections: Not on file  ? ? ? ?Allergies:   ?Allergies  ?Allergen Reactions  ? Morphine   ?  Nausea & vomiting  ? Lisinopril   ?  sweating  ? ? ?Metabolic Disorder Labs: ?Lab Results  ?Component Value Date  ? HGBA1C 6.5 05/13/2021  ? ?No results found for: PROLACTIN ?Lab Results  ?Component Value Date  ? CHOL 149 11/01/2021  ? TRIG 139 11/01/2021  ? HDL 52 11/01/2021  ? CHOLHDL 2.9 11/01/2021  ? VLDL 27.0 01/24/2021  ? [place] 75 11/01/2021  ? [place] 70 01/24/2021  ? ?Lab Results  ?Component Value Date  ? TSH 2.16 11/01/2021  ? ? ?Therapeutic Level Labs: ?No results found for: LITHIUM ?No results found for: CBMZ ?No results found for: VALPROATE ? ?Current Medications: ?Current Outpatient Medications  ?Medication Sig Dispense Refill  ? ALPRAZolam (XANAX) 0.25 MG tablet Take 1 tablet (0.25 mg total) by mouth 2 (two) times daily as needed for anxiety. 20 tablet 0  ? amLODipine (NORVASC) 5 MG tablet Take 1 tablet (5 mg total) by mouth daily. 90 tablet 1  ? cetirizine (ZYRTEC) 10 MG tablet Take 1 tablet (10 mg total) by mouth daily.    ? desvenlafaxine (PRISTIQ) 100 MG 24 hr tablet Take 1 tablet (100 mg total) by mouth daily. 90 tablet 1  ? fenofibrate 160 MG tablet Take 1 tablet (160 mg total) by mouth daily. 90 tablet 1  ? levonorgestrel-ethinyl estradiol (ALESSE) 0.1-20 MG-MCG tablet Take 1 tablet by mouth daily. 84 tablet 2  ? levothyroxine (SYNTHROID) 150 MCG tablet Take 1 tablet (150 mcg total) by mouth daily. 90 tablet 0  ? rosuvastatin (CRESTOR) 10 MG tablet Take 1 tablet (10 mg total) by mouth daily. 90 tablet 1  ? valACYclovir (VALTREX) 1000 MG tablet Take 1 tablet (1,000 mg total) by mouth 3 (three) times daily. (Patient taking differently: Take 1,000 mg by mouth 3 (three) times daily. As needed.) 30 tablet 2  ? ferrous fumarate (HEMOCYTE - 106 MG FE) 325 (106 Fe) MG TABS tablet Take 1 tablet (106 mg of iron total) by mouth daily. (Patient not taking: Reported on 01/07/2022) 30 tablet 2  ? zolpidem (AMBIEN)  10 MG tablet Take 1 tablet (10 mg total) by mouth at bedtime as needed for sleep. TAKE ONE AT NIGHT FOR SLEEP 30 tablet 2  ? ?No current facility-administered medications for  this visit.  ? ? ? ? ?Psychiatric Specialty Exam: ?Review of Systems  ?Cardiovascular:  Negative for chest pain and palpitations.  ?Neurological:  Negative for tremors.  ?Psychiatric/Behavioral:  Negative for self-injury and suicidal ideas.    ?Blood pressure 124/78, pulse 81, temperature 97.8 ?F (36.6 ?C), height '5\' 3"'$  (1.6 m), weight 180 lb (81.6 kg), SpO2 98 %.Body mass index is 31.89 kg/m?.  ?General Appearance: Casual  ?Eye Contact:  Fair  ?Speech:  Clear and Coherent  ?Volume:  Normal  ?Mood:  Euthymic  ?Affect:  Full Range  ?Thought Process:  Goal Directed  ?Orientation:  Full (Time, Place, and Person)  ?Thought Content:  Rumination  ?Suicidal Thoughts:  No  ?Homicidal Thoughts:  No  ?Memory:  Immediate;   Fair ?Recent;   Fair  ?Judgement:  Fair  ?Insight:  Fair  ?Psychomotor Activity:  Normal  ?Concentration:  Concentration: Fair and Attention Span: Fair  ?Recall:  Fair  ?[employer] [place]  ?Language: Good  ?Akathisia:  No  ?Handed:    ?AIMS (if indicated):  not done  ?Assets:  Desire for Improvement ?Financial Resources/Insurance ?Physical Health ?Social Support  ?ADL's:  Intact  ?Cognition: WNL  ?Sleep:  Poor  ? ?Screenings: ?PHQ2-9   ? ?[place] Office Visit from 12/13/2020 in [place] Office Visit from 10/19/2020 in [facility] at [place] Visit from 03/19/2017 in [facility] at [place] Visit from 06/22/2015 in Primary Care at [place] from 05/01/2015 in [place] at [facility]  ?PHQ-2 Total Score 0 0 0 0 0  ? ?  ? ?[place] Office Visit from 09/12/2021 in [place] Office Visit from 05/14/2021 in [place] Office  Visit from 01/31/2021 in [place]  ?C-SSRS RISK CATEGORY No Risk No Risk No Risk  ? ?  ? ? ?Assessment and Plan: as follows ? ?Prior documentation reviewed ? ?Mood d

## 2022-01-09 ENCOUNTER — Ambulatory Visit (INDEPENDENT_AMBULATORY_CARE_PROVIDER_SITE_OTHER): Payer: BC Managed Care – PPO | Admitting: Family Medicine

## 2022-01-09 ENCOUNTER — Encounter: Payer: Self-pay | Admitting: Family Medicine

## 2022-01-09 VITALS — BP 130/78 | HR 83 | Temp 98.5°F | Resp 18 | Ht 63.0 in | Wt 182.8 lb

## 2022-01-09 DIAGNOSIS — R6884 Jaw pain: Secondary | ICD-10-CM | POA: Diagnosis not present

## 2022-01-09 MED ORDER — CYCLOBENZAPRINE HCL 10 MG PO TABS: 10.0000 mg | ORAL_TABLET | Freq: Three times a day (TID) | ORAL | 0 refills | Status: DC | PRN

## 2022-01-09 NOTE — Progress Notes (Signed)
Subjective:   By signing my name below, I, [name], attest that this documentation has been prepared under the direction and in the presence of [name], DO  01/09/2022    Patient ID: Katie [name], female    DOB: 10-14-1965, 56 y.o.   MRN: [id number]  Chief Complaint  Patient presents with   Ear Pain    X almost 2 weeks, pt states pain started in left jaw and gone into the ear.     HPI Patient is in today for a office visit.   She complains of left ear and left jaw pain for the past 1.5 weeks. She denies having any pain while eating or any other symptoms. She clinches her teeth at night. She also does it occasionally during the day but stops herself when she realizes it. She also sleeps on her hand under her face at night. She is due for dental care and is interested in setting up an appointment.  She also finds her allergies are worsening at this time. She is taking OTC zyrtec to manage her symptoms.    Past Medical History:  Diagnosis Date   Allergy    Anemia    Anxiety    Blood transfusion without reported diagnosis    Hyperlipidemia    Hypertension    Migraine    PVC (premature ventricular contraction)    RLS (restless legs syndrome)    Thyroid disease     Past Surgical History:  Procedure Laterality Date   BUNIONECTOMY  06/04/2009   Right foot   CARPAL TUNNEL RELEASE     Bilateral   CRYOABLATION  11/04   [place]   ENDOMETRIAL ABLATION     SEPTOPLASTY  6/05   TUBAL LIGATION  04/1999   UPPER GASTROINTESTINAL ENDOSCOPY      Family History  Problem Relation Age of Onset   Depression Father        Suicide   Aneurysm Mother        Brain   Hypertension Sister    Thyroid disease Sister    Diabetes Maternal Grandmother    Alzheimer's disease Maternal Grandmother    Uterine cancer Maternal Grandmother    Hypertension Maternal Grandmother    Osteoporosis Maternal Grandmother    Cancer Maternal Grandmother         uterine   Lung cancer Maternal Grandfather    Cancer Maternal Grandfather        lung   Cancer Paternal Grandmother        metatstatic-- ? 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[employer]  Tobacco Use   Smoking status: Former    Packs/day: 1.00    Types: Cigarettes    Quit date: 01/14/1995    Years since quitting: 27.0   Smokeless tobacco: Never   Tobacco comments:    started smoking at age 53 1ppd  Vaping Use   Vaping Use: Never used  Substance and Sexual Activity   Alcohol use: Yes    Alcohol/week: 2.0 standard drinks    Types: 2 Cans of beer per week    Comment: Reports a couple of beers 1- 2 times a month   Drug use: No   Sexual activity: Not Currently    Partners: Male    Comment: 1st intercourse 56 yo-More than 5 partners-BTL  Other Topics Concern   Not on file  Social History Narrative   Exercise-- no   Social Determinants of Health   Financial Resource Strain: Not on file  Food Insecurity: Not on file  Transportation Needs: Not on file  Physical Activity: Not on file  Stress: Not on file  Social Connections: Not on file  Intimate Partner Violence: Not on file    Outpatient Medications Prior to Visit  Medication Sig Dispense Refill   ALPRAZolam (XANAX) 0.25 MG tablet Take 1 tablet (0.25 mg total) by mouth 2 (two) times daily as needed for anxiety. 20 tablet 0   amLODipine (NORVASC) 5 MG tablet Take 1 tablet (5 mg total) by mouth daily. 90 tablet 1   cetirizine (ZYRTEC) 10 MG tablet Take 1 tablet (10 mg total) by mouth daily.      desvenlafaxine (PRISTIQ) 100 MG 24 hr tablet Take 1 tablet (100 mg total) by mouth daily. 90 tablet 1   fenofibrate 160 MG tablet Take 1 tablet (160 mg total) by mouth daily. 90 tablet 1   levonorgestrel-ethinyl estradiol (ALESSE) 0.1-20 MG-MCG tablet Take 1 tablet by mouth daily. 84 tablet 2   levothyroxine (SYNTHROID) 150 MCG tablet Take 1 tablet (150 mcg total) by mouth daily. 90 tablet 0   rosuvastatin (CRESTOR) 10 MG tablet Take 1 tablet (10 mg total) by mouth daily. 90 tablet 1   zolpidem (AMBIEN) 10 MG tablet Take 1 tablet (10 mg total) by mouth at bedtime as needed for sleep. TAKE ONE AT NIGHT FOR SLEEP 30 tablet 2   ferrous fumarate (HEMOCYTE - 106 MG FE) 325 (106 Fe) MG TABS tablet Take 1 tablet (106 mg of iron total) by mouth daily. (Patient not taking: Reported on 01/09/2022) 30 tablet 2   valACYclovir (VALTREX) 1000 MG tablet Take 1 tablet (1,000 mg total) by mouth 3 (three) times daily. (Patient not taking: Reported on 01/09/2022) 30 tablet 2   No facility-administered medications prior to visit.    Allergies  Allergen Reactions   Morphine     Nausea & vomiting   Lisinopril     sweating    Review of Systems  HENT:         (+)left jaw pain (+)left ear pain  Endo/Heme/Allergies:  Positive for environmental allergies.      Objective:    Physical Exam Constitutional:      General: She is not in acute distress.    Appearance: Normal appearance. She is not ill-appearing.  HENT:     Head: Normocephalic and atraumatic.     Right Ear: Ear canal and external ear normal. Tympanic membrane is erythematous (mild).     Left Ear: Tympanic membrane, ear canal and external ear normal.     Mouth/Throat:     Comments: TMJ tenderness Eyes:     Extraocular Movements: Extraocular movements intact.     Pupils: Pupils are equal, round, and reactive to light.  Cardiovascular:     Rate and Rhythm: Normal rate and regular rhythm.     Heart sounds: Normal  heart sounds. No murmur heard.    No gallop.  Pulmonary:     Effort: Pulmonary effort is normal. No respiratory distress.     Breath sounds: Normal breath sounds. No wheezing or rales.  Skin:    General: Skin is warm and dry.  Neurological:     Mental Status: She is alert and oriented to person, place, and time.  Psychiatric:        Judgment: Judgment normal.    BP 130/78 (BP Location: Left Arm, Patient Position: Sitting, Cuff Size: Normal)   Pulse 83   Temp 98.5 F (36.9 C) (Oral)   Resp 18   Ht '5\' 3"'$  (1.6 m)   Wt 182 lb 12.8 oz (82.9 kg)   SpO2 96%   BMI 32.38 kg/m  Wt Readings from Last 3 Encounters:  01/09/22 182 lb 12.8 oz (82.9 kg)  11/01/21 180 lb 3.2 oz (81.7 kg)  04/25/21 179 lb (81.2 kg)    Diabetic Foot Exam - Simple   No data filed    Lab Results  Component Value Date   WBC 6.0 11/01/2021   HGB 10.8 (L) 11/01/2021   HCT 33.8 (L) 11/01/2021   PLT 498 (H) 11/01/2021   GLUCOSE 72 11/01/2021   CHOL 149 11/01/2021   TRIG 139 11/01/2021   HDL 52 11/01/2021   LDLDIRECT 62.0 04/09/2020   LDLCALC 75 11/01/2021   ALT 10 11/01/2021   AST 15 11/01/2021   NA 135 11/01/2021   K 4.9 11/01/2021   CL 101 11/01/2021   CREATININE 0.67 11/01/2021   BUN 11 11/01/2021   CO2 22 11/01/2021   TSH 2.16 11/01/2021   HGBA1C 6.5 05/13/2021   MICROALBUR 1.2 10/19/2020    Lab Results  Component Value Date   TSH 2.16 11/01/2021   Lab Results  Component Value Date   WBC 6.0 11/01/2021   HGB 10.8 (L) 11/01/2021   HCT 33.8 (L) 11/01/2021   MCV 80.3 11/01/2021   PLT 498 (H) 11/01/2021   Lab Results  Component Value Date   NA 135 11/01/2021   K 4.9 11/01/2021   CO2 22 11/01/2021   GLUCOSE 72 11/01/2021   BUN 11 11/01/2021   CREATININE 0.67 11/01/2021   BILITOT 0.3 11/01/2021   ALKPHOS 50 05/13/2021   AST 15 11/01/2021   ALT 10 11/01/2021   PROT 7.1 11/01/2021   ALBUMIN 4.3 05/13/2021   CALCIUM 9.2 11/01/2021   GFR 88.30 05/13/2021   Lab Results  Component Value Date   CHOL 149 11/01/2021    Lab Results  Component Value Date   HDL 52 11/01/2021   Lab Results  Component Value Date   LDLCALC 75 11/01/2021   Lab Results  Component Value Date   TRIG 139 11/01/2021   Lab Results  Component Value Date   CHOLHDL 2.9 11/01/2021   Lab Results  Component Value Date   HGBA1C 6.5 05/13/2021       Assessment & Plan:   Problem List Items Addressed This Visit       Unprioritized   Jaw pain - Primary    Muscle relaxer qhs F/u dentist  rto prn        Relevant Medications   cyclobenzaprine (FLEXERIL) 10 MG tablet     Meds ordered this encounter  Medications   cyclobenzaprine (FLEXERIL) 10 MG tablet    Sig: Take 1 tablet (10 mg total) by mouth 3 (three) times daily as needed for muscle spasms.    Dispense:  30 tablet    Refill:  0    I, [name], DO, personally preformed the services described in this documentation.  All medical record entries made by the scribe were at my direction and in my presence.  I have reviewed the chart and discharge instructions (if applicable) and agree that the record reflects my personal performance and is accurate and complete. 01/09/2022   I,[name],acting as a scribe for [name], DO.,have documented all relevant documentation on the behalf of [name], DO,as directed by  [name], DO while in the presence of [name], DO.   [name], DO

## 2022-01-09 NOTE — Patient Instructions (Signed)
Temporomandibular Joint Syndrome  Temporomandibular joint syndrome (TMJ syndrome) is a condition that causes pain in the temporomandibular joints. These joints are located near your ears and allow your jaw to open and close. For people with TMJ syndrome, chewing, biting, or other movements of the jaw can be difficult or painful. TMJ syndrome is often mild and goes away within a few weeks. However, sometimes the condition becomes a long-term (chronic) problem. What are the causes? This condition may be caused by: Grinding your teeth or clenching your jaw. Some people do this when they are stressed. Arthritis. An injury to the jaw. A head or neck injury. Teeth or dentures that are not aligned well. In some cases, the cause of TMJ syndrome may not be known. What are the signs or symptoms? The most common symptom of this condition is aching pain on the side of the head in the area of the TMJ. Other symptoms may include: Pain when moving your jaw, such as when chewing or biting. Not being able to open your jaw all the way. Making a clicking sound when you open your mouth. Headache. Earache. Neck or shoulder pain. How is this diagnosed? This condition may be diagnosed based on: Your symptoms and medical history. A physical exam. Your health care provider may check the range of motion of your jaw. Imaging tests, such as X-rays or an MRI. You may also need to see your dentist, who will check if your teeth and jaw are lined up correctly. How is this treated? TMJ syndrome often goes away on its own. If treatment is needed, it may include: Eating soft foods and applying ice or heat. Medicines to relieve pain or inflammation. Medicines or massage to relax the muscles. A splint, bite plate, or mouthpiece to prevent teeth grinding or jaw clenching. Relaxation techniques or counseling to help reduce stress. A therapy for pain in which an electrical current is applied to the nerves through the skin  (transcutaneous electrical nerve stimulation). Acupuncture. This may help to relieve pain. Jaw surgery. This is rarely needed. Follow these instructions at home:  Eating and drinking Eat a soft diet if you are having trouble chewing. Avoid foods that require a lot of chewing. Do not chew gum. General instructions Take over-the-counter and prescription medicines only as told by your health care provider. If directed, put ice on the painful area. To do this: Put ice in a plastic bag. Place a towel between your skin and the bag. Leave the ice on for 20 minutes, 2-3 times a day. Remove the ice if your skin turns bright red. This is very important. If you cannot feel pain, heat, or cold, you have a greater risk of damage to the area. Apply a warm, wet cloth (warm compress) to the painful area as told. Massage your jaw area and do any jaw stretching exercises as told by your health care provider. If you were given a splint, bite plate, or mouthpiece, wear it as told by your health care provider. Keep all follow-up visits. This is important. Where to find more information National Institute of Dental and Craniofacial Research: www.nidcr.nih.gov Contact a health care provider if: You have trouble eating. You have new or worsening symptoms. Get help right away if: Your jaw locks. Summary Temporomandibular joint syndrome (TMJ syndrome) is a condition that causes pain in the temporomandibular joints. These joints are located near your ears and allow your jaw to open and close. TMJ syndrome is often mild and goes away within   a few weeks. However, sometimes the condition becomes a long-term (chronic) problem. Symptoms include an aching pain on the side of the head in the area of the TMJ, pain when chewing or biting, and being unable to open your jaw all the way. You may also make a clicking sound when you open your mouth. TMJ syndrome often goes away on its own. If treatment is needed, it may  include medicines to relieve pain, reduce inflammation, or relax the muscles. A splint, bite plate, or mouthpiece may also be used to prevent teeth grinding or jaw clenching. This information is not intended to replace advice given to you by your health care provider. Make sure you discuss any questions you have with your health care provider. Document Revised: 03/24/2021 Document Reviewed: 03/24/2021 Elsevier Patient Education  2023 Elsevier Inc.  

## 2022-01-09 NOTE — Assessment & Plan Note (Signed)
Muscle relaxer qhs F/u dentist  rto prn

## 2022-01-11 ENCOUNTER — Encounter: Payer: Self-pay | Admitting: Family Medicine

## 2022-01-11 DIAGNOSIS — E785 Hyperlipidemia, unspecified: Secondary | ICD-10-CM

## 2022-01-13 MED ORDER — ROSUVASTATIN CALCIUM 10 MG PO TABS: 10.0000 mg | ORAL_TABLET | Freq: Every day | ORAL | 1 refills | Status: DC

## 2022-01-17 ENCOUNTER — Encounter: Payer: Self-pay | Admitting: Family Medicine

## 2022-01-17 DIAGNOSIS — F419 Anxiety disorder, unspecified: Secondary | ICD-10-CM

## 2022-01-17 MED ORDER — ALPRAZOLAM 0.25 MG PO TABS: 0.2500 mg | ORAL_TABLET | Freq: Two times a day (BID) | ORAL | 0 refills | Status: DC | PRN

## 2022-01-17 NOTE — Telephone Encounter (Signed)
Requesting: alprazolam 0.'25mg'$  Contract: None UDS: 10/10/19 Last Visit: 01/09/22 Next Visit: None Last Refill: 12/27/21 #20 and 0RF Pt sig: 1 tab bid prn   Please Advise

## 2022-01-24 ENCOUNTER — Other Ambulatory Visit: Payer: Self-pay | Admitting: Family Medicine

## 2022-01-24 MED ORDER — LEVOTHYROXINE SODIUM 150 MCG PO TABS: 150.0000 ug | ORAL_TABLET | Freq: Every day | ORAL | 0 refills | Status: DC

## 2022-02-03 ENCOUNTER — Encounter: Payer: Self-pay | Admitting: Family Medicine

## 2022-02-19 ENCOUNTER — Other Ambulatory Visit: Payer: Self-pay | Admitting: Family Medicine

## 2022-02-19 DIAGNOSIS — F419 Anxiety disorder, unspecified: Secondary | ICD-10-CM

## 2022-02-19 NOTE — Telephone Encounter (Signed)
Requesting:xanax 0.25 mg Contract:unknown IOP:[facility] Last Visit:01/09/22 Next Visit:unknown Last Refill:01/17/22  Please Advise

## 2022-02-20 MED ORDER — ALPRAZOLAM 0.25 MG PO TABS: 0.2500 mg | ORAL_TABLET | Freq: Two times a day (BID) | ORAL | 0 refills | Status: DC | PRN

## 2022-02-28 IMAGING — US US EXTREM LOW VENOUS*L*
1 series · 13 of 24 positions shown · non-contrast
Comparison: None.

CLINICAL DATA: Leg pain.

EXAM:
LEFT LOWER EXTREMITY VENOUS DOPPLER ULTRASOUND
TECHNIQUE: Gray-scale sonography with compression, as well as color and duplex
ultrasound, were performed to evaluate the deep venous system(s)
from the level of the common femoral vein through the popliteal and
proximal calf veins.

[Series 1: us extrem low venous*left* · 44 acquisitions, 13 frames shown]
[im 1/44]
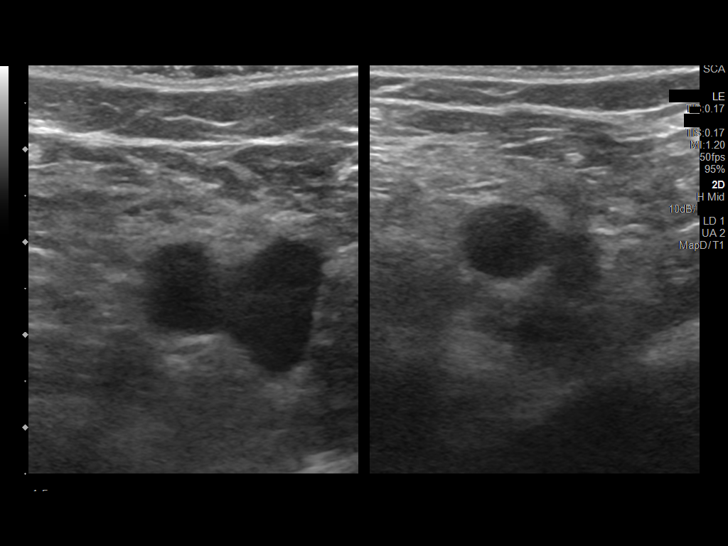
[im 4/44]
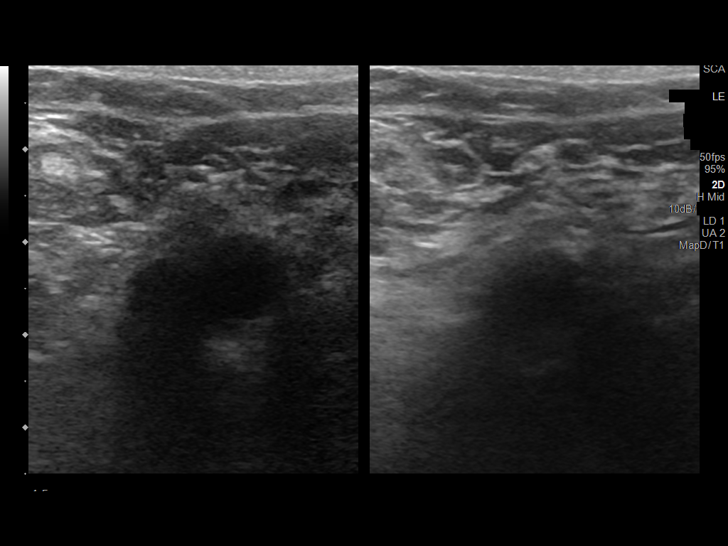
[im 8/44]
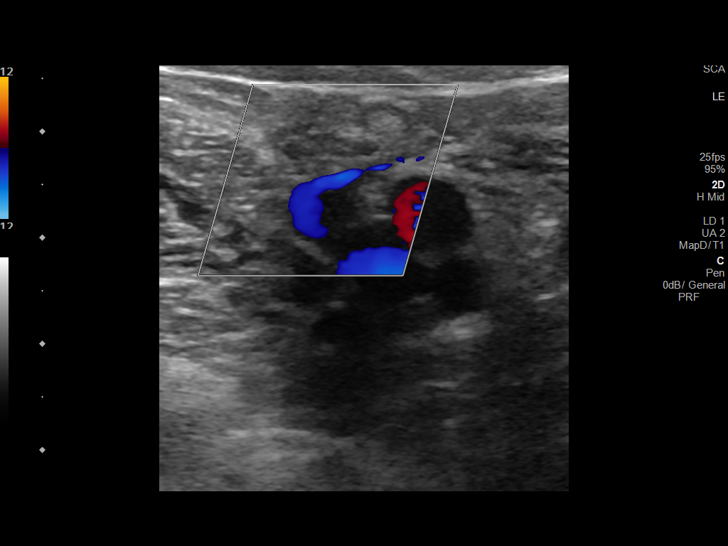
[im 12/44]
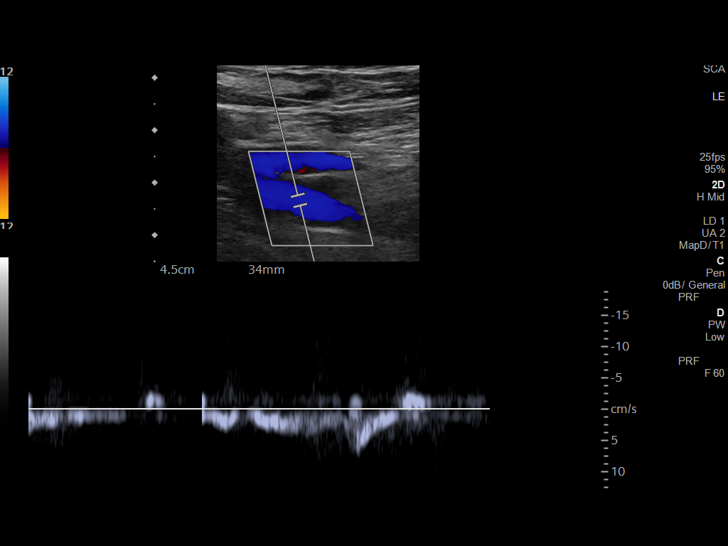
[im 15/44]
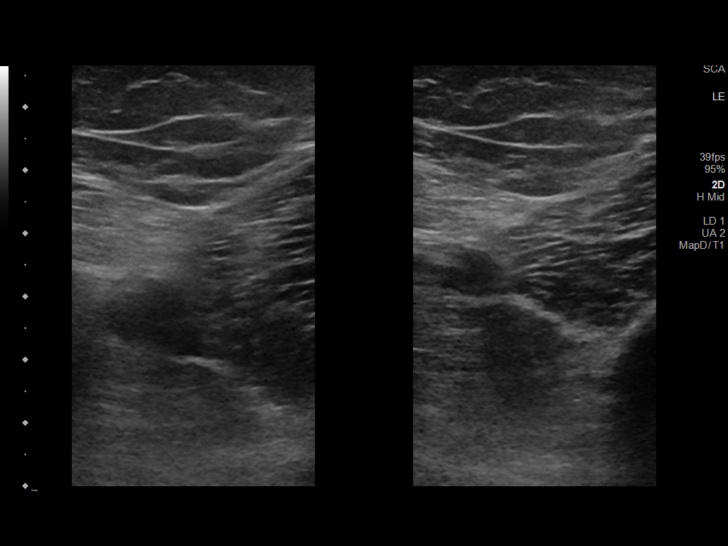
[im 19/44]
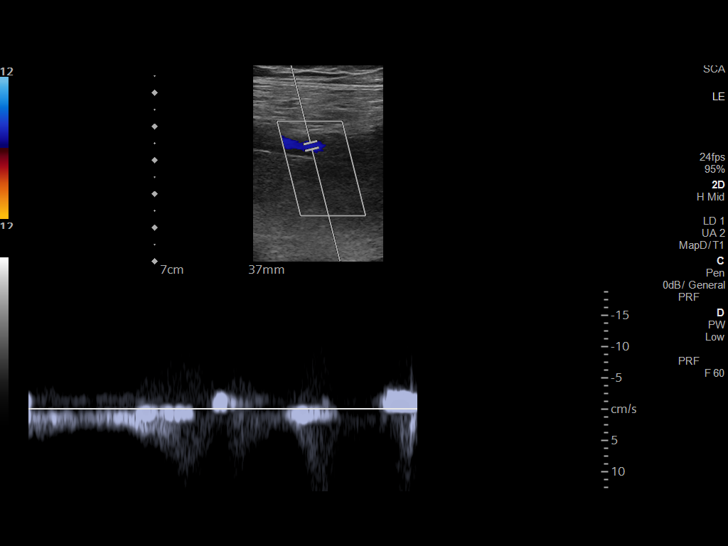
[im 25/44]
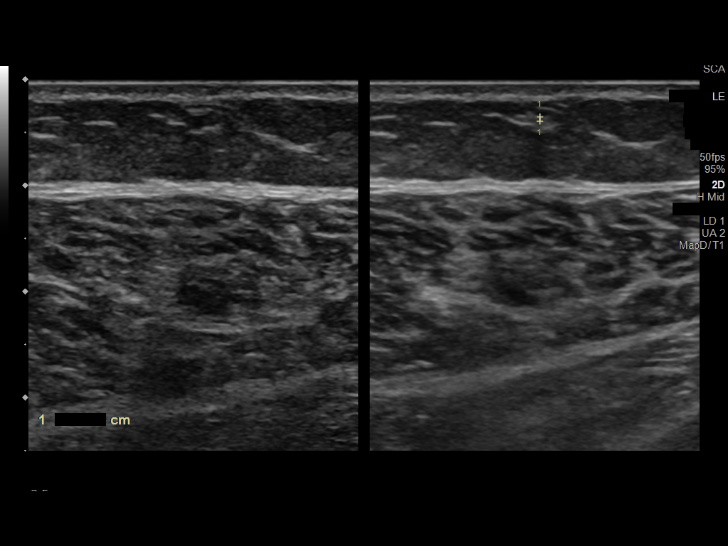
[im 27/44]
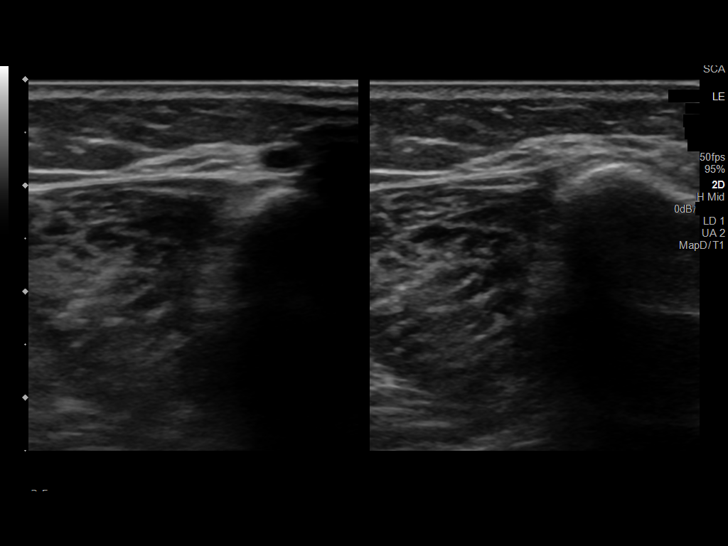
[im 30/44]
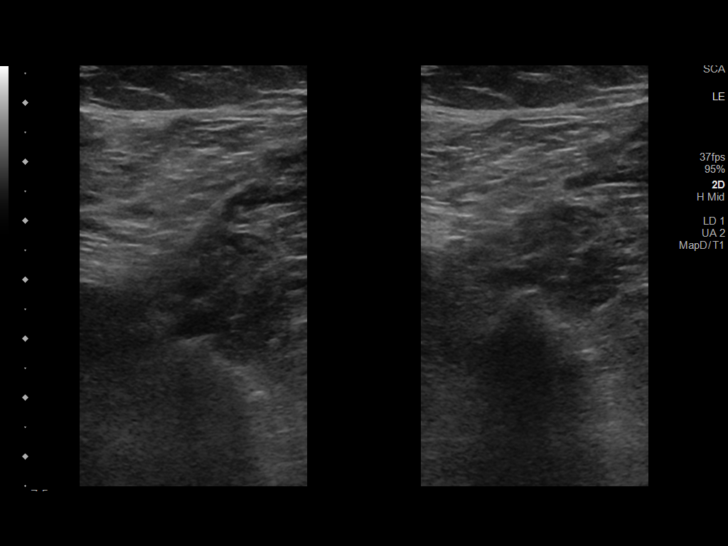
[im 34/44]
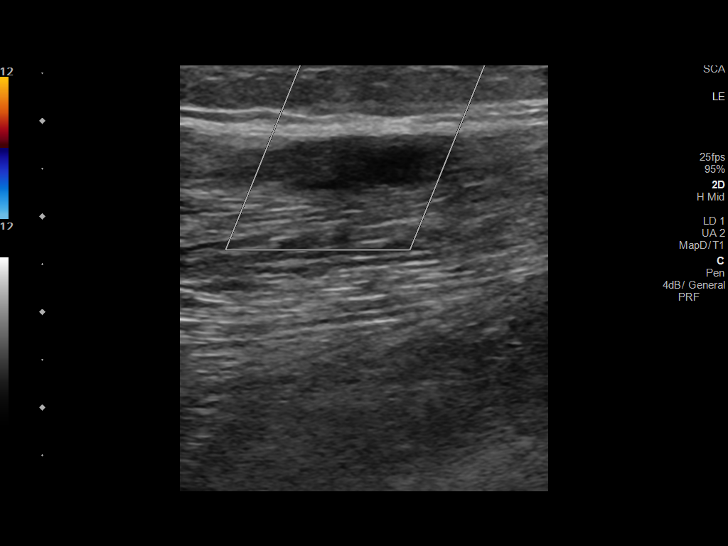
[im 36/44]
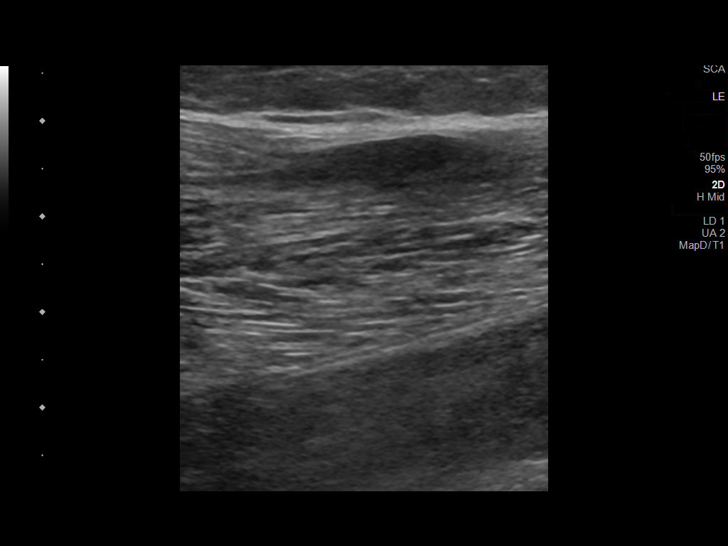
[im 40/44]
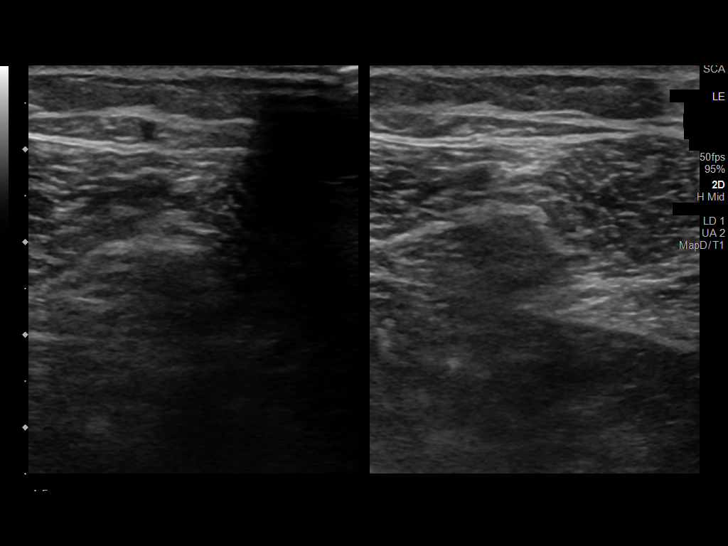
[im 44/44]
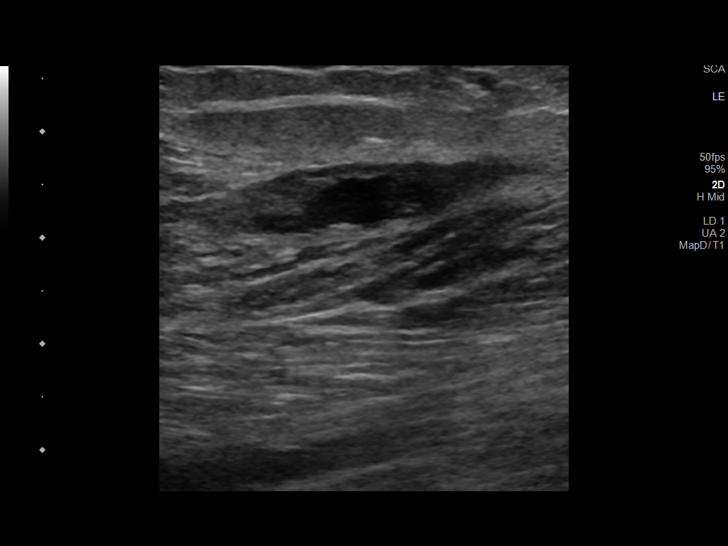

[13 of 24 positions shown; findings below may reference images not displayed]

FINDINGS: VENOUS

Normal compressibility of the common femoral, superficial femoral,
and popliteal veins, as well as the visualized calf veins.
Visualized portions of profunda femoral vein and great saphenous
vein unremarkable. No filling defects to suggest DVT on grayscale or
color Doppler imaging. Doppler waveforms show normal direction of
venous flow, normal respiratory plasticity and response to
augmentation.

Limited views of the contralateral common femoral vein are
unremarkable.

OTHER

Hypoechoic collection within the soft tissues of the LEFT calf,
measuring 3.1 cm in length and 6 mm thickness, compatible with
hematoma versus ruptured Baker cyst.

Limitations: none
IMPRESSION: 1. No DVT.
2. Hypoechoic fluid-like collection within the soft tissues of the
LEFT calf, measuring 3.1 cm in length and 6 mm thickness, compatible
with hematoma versus ruptured Baker cyst.

## 2022-03-04 ENCOUNTER — Encounter: Payer: Self-pay | Admitting: Nurse Practitioner

## 2022-03-04 ENCOUNTER — Other Ambulatory Visit: Payer: Self-pay | Admitting: Nurse Practitioner

## 2022-03-04 DIAGNOSIS — Z3041 Encounter for surveillance of contraceptive pills: Secondary | ICD-10-CM

## 2022-03-04 DIAGNOSIS — N920 Excessive and frequent menstruation with regular cycle: Secondary | ICD-10-CM

## 2022-03-04 DIAGNOSIS — G43829 Menstrual migraine, not intractable, without status migrainosus: Secondary | ICD-10-CM

## 2022-03-04 MED ORDER — LEVONORGESTREL-ETHINYL ESTRAD 0.1-20 MG-MCG PO TABS: 1.0000 | ORAL_TABLET | Freq: Every day | ORAL | 0 refills | Status: DC

## 2022-03-04 NOTE — Telephone Encounter (Signed)
Patient annual exam due in Sept.

## 2022-03-05 NOTE — Progress Notes (Deleted)
   Katie [name] 01-12-1966 [id number]   History:  56 y.o. [id number] presents for annual exam. COCs continuously with withdrawal bleeding every 3-4 months. History of menstrual migraines and menorrhagia that is well controlled with hormonal contraception. She tried patches last year but felt they were not as effective. 2008 endometrial ablation with no bleeding initially but a few years later began having heavy cycles again. Normal pap and mammogram history. T2DM and Hashimoto's managed by primary care.   Gynecologic History No LMP recorded. (Menstrual status: Oral contraceptives).   Contraception: OCP (estrogen/progesterone) and tubal ligation Sexually active: Yes  Health Maintenance Last Pap: 01/26/2018. Results were: Normal, 5-year repeat Last mammogram: 11/18/2021. Results were: Normal Last colonoscopy: 09/12/2020. Results were: Normal, 10-year recall Last Dexa: Not indicated  Past medical history, past surgical history, family history and social history were all reviewed and documented in the EPIC chart. Married. 2 children, 7 grandchildren. Maternal grandmother with history of ovarian cancer.   ROS:  A ROS was performed and pertinent positives and negatives are included.  Exam:  There were no vitals filed for this visit.   There is no height or weight on file to calculate BMI.  General appearance:  Normal Thyroid:  Symmetrical, normal in size, without palpable masses or nodularity. Respiratory  Auscultation:  Clear without wheezing or rhonchi Cardiovascular  Auscultation:  Regular rate, without rubs, murmurs or gallops  Edema/varicosities:  Not grossly evident Abdominal  Soft,nontender, without masses, guarding or rebound.  Liver/spleen:  No organomegaly noted  Hernia:  None appreciated  Skin  Inspection:  Grossly normal   Breasts: Examined lying and sitting.   Right: Without masses, retractions, discharge or axillary adenopathy.   Left: Without masses, retractions,  discharge or axillary adenopathy. Genitourinary   Inguinal/mons:  Normal without inguinal adenopathy  External genitalia:  Normal appearing vulva with no masses, tenderness, or lesions  BUS/Urethra/Skene's glands:  Normal  Vagina:  Normal appearing with normal color and discharge, no lesions  Cervix:  Normal appearing without discharge or lesions  Uterus:  Normal in size, shape and contour.  Midline and mobile, nontender  Adnexa/parametria:     Rt: Normal in size, without masses or tenderness.   Lt: Normal in size, without masses or tenderness.  Anus and perineum: Normal  Digital rectal exam: Normal sphincter tone without palpated masses or tenderness  Patient informed chaperone available to be present for breast and pelvic exam. Patient has requested no chaperone to be present. Patient has been advised what will be completed during breast and pelvic exam.    Assessment/Plan:  56 y.o. [id number] for annual exam.   Well female exam with routine gynecological exam - Education provided on SBEs, importance of preventative screenings, current guidelines, high calcium diet, regular exercise, and multivitamin daily. Labs with PCP.   Menorrhagia with regular cycle - Plan: norelgestromin-ethinyl estradiol (ORTHO EVRA) 150-35 MCG/24HR transdermal patch weekly. She does not want to have menses, so recommended skipping placebo week and alternate patch weekly. We discussed all other methods including LARCs and she would like patch.   Screening for cervical cancer - Normal Pap history.  Will repeat at 5-year interval per guidelines.  Screening for breast cancer - Normal mammogram history.  Continue annual screenings.  Normal breast exam today.  Screening for colon cancer - 2022 colonoscopy. Will repeat at GI's recommended interval.   Follow up in 1 year for annual      [place], 3:56 PM 03/05/2022

## 2022-03-06 ENCOUNTER — Ambulatory Visit: Payer: BC Managed Care – PPO | Admitting: Nurse Practitioner

## 2022-03-10 ENCOUNTER — Encounter: Payer: Self-pay | Admitting: Family Medicine

## 2022-03-10 DIAGNOSIS — F32A Depression, unspecified: Secondary | ICD-10-CM

## 2022-03-10 MED ORDER — DESVENLAFAXINE SUCCINATE ER 100 MG PO TB24: 100.0000 mg | ORAL_TABLET | Freq: Every day | ORAL | 1 refills | Status: DC

## 2022-03-12 ENCOUNTER — Other Ambulatory Visit: Payer: Self-pay | Admitting: Family Medicine

## 2022-03-12 DIAGNOSIS — F419 Anxiety disorder, unspecified: Secondary | ICD-10-CM

## 2022-03-12 NOTE — Telephone Encounter (Signed)
Requesting: alprazolam 0.'25mg'$    Contract:  UDS: 10/10/19 Last Visit: 01/09/22 Next Visit: none Last Refill: 02/20/22  Please Advise

## 2022-03-13 MED ORDER — ALPRAZOLAM 0.25 MG PO TABS: 0.2500 mg | ORAL_TABLET | Freq: Two times a day (BID) | ORAL | 0 refills | Status: DC | PRN

## 2022-04-12 ENCOUNTER — Encounter: Payer: Self-pay | Admitting: Family Medicine

## 2022-04-12 DIAGNOSIS — F419 Anxiety disorder, unspecified: Secondary | ICD-10-CM

## 2022-04-14 MED ORDER — ALPRAZOLAM 0.25 MG PO TABS: 0.2500 mg | ORAL_TABLET | Freq: Two times a day (BID) | ORAL | 0 refills | Status: DC | PRN

## 2022-04-14 NOTE — Telephone Encounter (Signed)
Requesting: Xanax Contract:  UDS: Last OV: 01/09/22 Next OV: N/A Last Refill: 03/13/2022, #20--0 RF Database:   Please advise

## 2022-04-23 ENCOUNTER — Other Ambulatory Visit: Payer: Self-pay | Admitting: Family Medicine

## 2022-04-23 MED ORDER — LEVOTHYROXINE SODIUM 150 MCG PO TABS: 150.0000 ug | ORAL_TABLET | Freq: Every day | ORAL | 0 refills | Status: DC

## 2022-04-30 ENCOUNTER — Telehealth (HOSPITAL_COMMUNITY): Payer: Self-pay | Admitting: *Deleted

## 2022-04-30 MED ORDER — ZOLPIDEM TARTRATE 10 MG PO TABS
10.0000 mg | ORAL_TABLET | Freq: Every evening | ORAL | 2 refills | Status: DC | PRN
Start: 2022-04-30 — End: 2022-07-29

## 2022-04-30 NOTE — Addendum Note (Signed)
Addended by: Merian Capron on: 04/30/2022 09:26 AM   Modules accepted: Orders

## 2022-04-30 NOTE — Telephone Encounter (Signed)
[facility]  sent refill request --- zolpidem (AMBIEN) 10 MG tablet

## 2022-05-10 ENCOUNTER — Other Ambulatory Visit: Payer: Self-pay | Admitting: Family Medicine

## 2022-05-10 ENCOUNTER — Encounter: Payer: Self-pay | Admitting: Family Medicine

## 2022-05-10 DIAGNOSIS — I10 Essential (primary) hypertension: Secondary | ICD-10-CM

## 2022-05-10 DIAGNOSIS — F419 Anxiety disorder, unspecified: Secondary | ICD-10-CM

## 2022-05-12 MED ORDER — ALPRAZOLAM 0.25 MG PO TABS: 0.2500 mg | ORAL_TABLET | Freq: Two times a day (BID) | ORAL | 0 refills | Status: DC | PRN

## 2022-05-12 MED ORDER — AMLODIPINE BESYLATE 5 MG PO TABS: 5.0000 mg | ORAL_TABLET | Freq: Every day | ORAL | 1 refills | Status: DC

## 2022-05-12 NOTE — Telephone Encounter (Signed)
Requesting: alprazolam 0.'25mg'$   Contract: None UDS: 10/10/19 Last Visit: 01/09/22 Next Visit: None Last Refill: 04/14/22 #20 and 0RF  Please Advise

## 2022-05-13 ENCOUNTER — Ambulatory Visit (INDEPENDENT_AMBULATORY_CARE_PROVIDER_SITE_OTHER): Payer: BC Managed Care – PPO | Admitting: Nurse Practitioner

## 2022-05-13 ENCOUNTER — Encounter: Payer: Self-pay | Admitting: Nurse Practitioner

## 2022-05-13 ENCOUNTER — Other Ambulatory Visit (HOSPITAL_COMMUNITY)
Admission: RE | Admit: 2022-05-13 | Discharge: 2022-05-13 | Disposition: A | Discharge status: Home / Self Care | Payer: BC Managed Care – PPO | Source: Ambulatory Visit | Attending: Nurse Practitioner | Admitting: Nurse Practitioner

## 2022-05-13 VITALS — BP 132/72 | HR 66 | Resp 14 | Ht 63.0 in | Wt 179.0 lb

## 2022-05-13 DIAGNOSIS — N920 Excessive and frequent menstruation with regular cycle: Secondary | ICD-10-CM

## 2022-05-13 DIAGNOSIS — Z01419 Encounter for gynecological examination (general) (routine) without abnormal findings: Secondary | ICD-10-CM | POA: Insufficient documentation

## 2022-05-13 DIAGNOSIS — G43829 Menstrual migraine, not intractable, without status migrainosus: Secondary | ICD-10-CM

## 2022-05-13 DIAGNOSIS — Z3041 Encounter for surveillance of contraceptive pills: Secondary | ICD-10-CM

## 2022-05-13 MED ORDER — LEVONORGESTREL-ETHINYL ESTRAD 0.1-20 MG-MCG PO TABS: 1.0000 | ORAL_TABLET | Freq: Every day | ORAL | 1 refills | Status: DC

## 2022-05-13 NOTE — Progress Notes (Signed)
Katie [name] September 26, 1965 [id number]   History:  56 y.o. [id number] presents for annual exam. OCPs continuously with withdrawal bleeding every 2 months. She wants to try a different hormonal contraception as she does not want to take a pill everyday anymore. Patches were not covered by insurance last year. She did depo in the past and does not want again due to weight gain. 2008 endometrial ablation with no bleeding initially but a few years later began having heavy cycles again. Normal pap and mammogram history. T2DM and Hashimoto's managed by primary care.   Gynecologic History Patient's last menstrual period was 03/29/2022. Period Cycle (Days): 60 Period Duration (Days): 5-7 Period Pattern: Regular Menstrual Flow: Light Dysmenorrhea: (!) Mild Dysmenorrhea Symptoms: Headache, Cramping Contraception: OCP (estrogen/progesterone) and tubal ligation  Health Maintenance Last Pap: 01/26/2018. Results were: Normal neg HPV Last mammogram: 11/18/2021. Results were: Normal Last colonoscopy: 09/12/2020. Results were: Normal, 10-year recall Last Dexa: Not indicated  Past medical history, past surgical history, family history and social history were all reviewed and documented in the EPIC chart. Married. 2 children ages 45 and 46, 7 grandchildren ages 14-18. Son is in [place] in [employer], moving to [place] next year. Maternal grandmother with history of ovarian cancer.   ROS:  A ROS was performed and pertinent positives and negatives are included.  Exam:  Vitals:   05/13/22 0831  BP: 132/72  Pulse: 66  Resp: 14  Weight: 179 lb (81.2 kg)  Height: '5\' 3"'$  (1.6 m)     Body mass index is 31.71 kg/m.  General appearance:  Normal Thyroid:  Symmetrical, normal in size, without palpable masses or nodularity. Respiratory  Auscultation:  Clear without wheezing or rhonchi Cardiovascular  Auscultation:  Regular rate, without rubs, murmurs or gallops  Edema/varicosities:  Not grossly  evident Abdominal  Soft,nontender, without masses, guarding or rebound.  Liver/spleen:  No organomegaly noted  Hernia:  None appreciated  Skin  Inspection:  Grossly normal   Breasts: Examined lying and sitting.   Right: Without masses, retractions, discharge or axillary adenopathy.   Left: Without masses, retractions, discharge or axillary adenopathy. Genitourinary   Inguinal/mons:  Normal without inguinal adenopathy  External genitalia:  Normal appearing vulva with no masses, tenderness, or lesions  BUS/Urethra/Skene's glands:  Normal  Vagina:  Normal appearing with normal color and discharge, no lesions  Cervix:  Normal appearing without discharge or lesions  Uterus:  Normal in size, shape and contour.  Midline and mobile, nontender  Adnexa/parametria:     Rt: Normal in size, without masses or tenderness.   Lt: Normal in size, without masses or tenderness.  Anus and perineum: Normal  Digital rectal exam: Normal sphincter tone without palpated masses or tenderness  Patient informed chaperone available to be present for breast and pelvic exam. Patient has requested no chaperone to be present. Patient has been advised what will be completed during breast and pelvic exam.   Assessment/Plan:  56 y.o. [id number] for annual exam.   Well female exam with routine gynecological exam - Plan: Cytology - PAP( [place]). Education provided on SBEs, importance of preventative screenings, current guidelines, high calcium diet, regular exercise, and multivitamin daily.  Labs with PCP.   Menorrhagia with regular cycle - Plan: levonorgestrel-ethinyl estradiol (ALESSE) 0.1-20 MG-MCG tablet daily. Takes continuously x 2 months. Interested in different hormonal contraception to help with menses. She does not want to take a pill every day anymore. Patches were not covered by insurance last year. We discuss ring, IUD, Nexplanon, and  Depo. Does not want Depo, she had weight gain with it in the past.  Considering IUD and vaginal ring. Information provided on both. Will reach out if she decides to pursue.   Encounter for surveillance of contraceptive pills - Plan: levonorgestrel-ethinyl estradiol (ALESSE) 0.1-20 MG-MCG tablet daily.   Menstrual migraine without status migrainosus, not intractable - Plan: levonorgestrel-ethinyl estradiol (ALESSE) 0.1-20 MG-MCG tablet daily.   Screening for cervical cancer - Normal Pap history. Pap today.   Screening for breast cancer - Normal mammogram history.  Continue annual screenings.  Normal breast exam today.  Screening for colon cancer - 2022 colonoscopy. Will repeat at GI's recommended interval.   Follow up in 1 year for annual.     Tamela Gammon [facility], 9:07 AM 05/13/2022

## 2022-05-14 LAB — CYTOLOGY - PAP
Comment: NEGATIVE
Diagnosis: NEGATIVE
High risk HPV: NEGATIVE

## 2022-05-21 ENCOUNTER — Ambulatory Visit (HOSPITAL_COMMUNITY): Payer: BC Managed Care – PPO | Admitting: Psychiatry

## 2022-05-22 ENCOUNTER — Ambulatory Visit (INDEPENDENT_AMBULATORY_CARE_PROVIDER_SITE_OTHER): Payer: BC Managed Care – PPO | Admitting: Psychiatry

## 2022-05-22 ENCOUNTER — Encounter (HOSPITAL_COMMUNITY): Payer: Self-pay | Admitting: Psychiatry

## 2022-05-22 VITALS — BP 120/78 | HR 89 | Temp 97.6°F | Ht 63.0 in | Wt 181.0 lb

## 2022-05-22 DIAGNOSIS — F419 Anxiety disorder, unspecified: Secondary | ICD-10-CM

## 2022-05-22 DIAGNOSIS — F5102 Adjustment insomnia: Secondary | ICD-10-CM

## 2022-05-22 DIAGNOSIS — F0630 Mood disorder due to known physiological condition, unspecified: Secondary | ICD-10-CM | POA: Diagnosis not present

## 2022-05-22 DIAGNOSIS — F411 Generalized anxiety disorder: Secondary | ICD-10-CM | POA: Diagnosis not present

## 2022-05-22 MED ORDER — ALPRAZOLAM 0.25 MG PO TABS: 0.2500 mg | ORAL_TABLET | Freq: Two times a day (BID) | ORAL | 0 refills | Status: DC | PRN

## 2022-05-22 NOTE — Progress Notes (Signed)
[place] Follow up visit  Patient Identification: Katie [name] MRN:  [id number] Date of Evaluation:  05/22/2022 Referral Source: primary care Chief Complaint:  follow up insomnia   Visit Diagnosis:    ICD-10-CM   1. Mood disorder in conditions classified elsewhere  F06.30     2. GAD (generalized anxiety disorder)  F41.1     3. Adjustment insomnia  F51.02     4. 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 2 grown boys currently she is living with her husband  Patient has suffered from insomnia since young age    Doing fair, does gets stress related to job has to take xanax during the day small dose but feel 20 per month is not enough  Sleep fair with ambine  Did talk to her son after 4 years at grandson graduation Overall doing fair at home  Denies depression    Aggravating factors; job stress  distant relationship with son Modifying factors ; son, camping  Duration since young age Severity manageable but gets anxious and need xanax during weekends as well  She was seen at age 70 by her counselor after her father's death 1 time no medication given       Past Psychiatric History: insomnia, anxiety  Previous Psychotropic Medications: Yes  Zoloft, seroquel   Past Medical History:  Past Medical History:  Diagnosis Date   Allergy    Anemia    Anxiety    Blood transfusion without reported diagnosis    Hyperlipidemia    Hypertension    Migraine    PVC (premature ventricular contraction)    RLS (restless legs syndrome)    Thyroid disease     Past Surgical History:  Procedure Laterality Date   BUNIONECTOMY  06/04/2009   Right foot   CARPAL TUNNEL RELEASE     Bilateral   CRYOABLATION  11/04   ECTOPIC  PREGNANCY SURGERY  1998   ENDOMETRIAL ABLATION     SEPTOPLASTY  6/05   TUBAL LIGATION  04/1999   UPPER GASTROINTESTINAL ENDOSCOPY      Family Psychiatric History: Dad: committed suicide, see chart , Niece; depression  Family History:  Family History  Problem Relation Age of Onset   Depression Father        Suicide   Aneurysm Mother        Brain   Hypertension Sister    Thyroid disease Sister    Diabetes Maternal Grandmother    Alzheimer's disease Maternal Grandmother    Uterine cancer Maternal Grandmother    Hypertension Maternal Grandmother    Osteoporosis Maternal Grandmother    Cancer Maternal Grandmother        uterine   Lung cancer Maternal Grandfather    Cancer Maternal Grandfather        lung   Cancer Paternal Grandmother        metatstatic-- ? primary colon   Cancer Paternal Grandfather        skin, hepatic   Colon cancer Paternal Grandfather        age unknown   Colon polyps Neg Hx    Rectal cancer Neg Hx    Stomach cancer Neg Hx    Esophageal cancer Neg Hx     Social History:  Social History   Socioeconomic History   Marital status: Married    Spouse name: Not on file   Number of children: 2   Years of education: 15   Highest education level: Not on file  Occupational History   Occupation: scheduler [employer]  Tobacco Use   Smoking status: Former    Packs/day: 1.00    Types: Cigarettes    Quit date: 01/14/1995    Years since quitting: 27.3   Smokeless tobacco: Never   Tobacco comments:    started smoking at age 26 1ppd  Vaping Use   Vaping Use: Never used  Substance and Sexual Activity   Alcohol use: Yes    Alcohol/week: 2.0 standard drinks of alcohol    Types: 2 Cans of beer per week    Comment: Reports a couple of beers 1- 2 times a month   Drug use: No   Sexual activity: Not Currently    Partners: Male    Comment: 1st intercourse 56 yo-More than 5 partners-BTL  Other Topics Concern   Not on file  Social History Narrative    Exercise-- no   Social Determinants of Health   Financial Resource Strain: Not on file  Food Insecurity: Not on file  Transportation Needs: Not on file  Physical Activity: Not on file  Stress: Not on file  Social Connections: Not on file     Allergies:   Allergies  Allergen Reactions   Morphine     Nausea & vomiting   Lisinopril     sweating    Metabolic Disorder Labs: Lab Results  Component Value Date   HGBA1C 6.5 05/13/2021   No results found for: "PROLACTIN" Lab Results  Component Value Date   CHOL 149 11/01/2021   TRIG 139 11/01/2021   HDL 52 11/01/2021   CHOLHDL 2.9 11/01/2021   VLDL 27.0 01/24/2021   LDLCALC 75 11/01/2021   LDLCALC 70 01/24/2021   Lab Results  Component Value Date   TSH 2.16 11/01/2021    Therapeutic Level Labs: No results found for: "LITHIUM" No results found for: "CBMZ" No results found for: "VALPROATE"  Current Medications: Current Outpatient Medications  Medication Sig Dispense Refill   ALPRAZolam (XANAX) 0.25 MG tablet Take 1 tablet (0.25 mg total) by mouth 2 (two) times daily as needed for anxiety. 30 tablet 0   amLODipine (NORVASC) 5 MG tablet Take 1 tablet (5 mg total) by mouth daily. 90 tablet 1   cetirizine (ZYRTEC) 10 MG tablet Take 1 tablet (10 mg total) by mouth daily.     cyclobenzaprine (FLEXERIL) 10 MG tablet Take 1 tablet (10 mg total) by mouth 3 (three) times daily as needed for muscle spasms. 30 tablet 0   desvenlafaxine (PRISTIQ) 100 MG 24 hr tablet Take 1 tablet (100 mg total) by mouth daily. 90 tablet 1   fenofibrate 160 MG tablet Take 1 tablet (160 mg total) by mouth daily. 90 tablet 1   levonorgestrel-ethinyl estradiol (ALESSE) 0.1-20 MG-MCG tablet Take 1 tablet by mouth daily. Take ACTIVE pills only, skipping PLACEBO pills 84 tablet 1   levothyroxine (SYNTHROID) 150 MCG tablet Take 1 tablet (150 mcg total) by mouth daily before breakfast. 90 tablet 0   rosuvastatin (CRESTOR) 10 MG tablet Take 1 tablet (10 mg  total) by mouth daily. 90 tablet 1   zolpidem (AMBIEN) 10 MG tablet Take 1 tablet (10 mg total) by mouth at bedtime as needed for sleep. TAKE ONE AT NIGHT FOR SLEEP 30 tablet 2  No current facility-administered medications for this visit.      Psychiatric Specialty Exam: Review of Systems  Cardiovascular:  Negative for chest pain and palpitations.  Neurological:  Negative for tremors.  Psychiatric/Behavioral:  Negative for self-injury and suicidal ideas.     Blood pressure 120/78, pulse 89, temperature 97.6 F (36.4 C), height '5\' 3"'$  (1.6 m), weight 181 lb (82.1 kg), last menstrual period 03/29/2022.Body mass index is 32.06 kg/m.  General Appearance: Casual  Eye Contact:  Fair  Speech:  Clear and Coherent  Volume:  Normal  Mood:  Euthymic  Affect:  Full Range  Thought Process:  Goal Directed  Orientation:  Full (Time, Place, and Person)  Thought Content:  Rumination  Suicidal Thoughts:  No  Homicidal Thoughts:  No  Memory:  Immediate;   Fair Recent;   Fair  Judgement:  Fair  Insight:  Fair  Psychomotor Activity:  Normal  Concentration:  Concentration: Fair and Attention Span: Fair  Recall:  [employer] of Knowledge:Good  Language: Good  Akathisia:  No  Handed:    AIMS (if indicated):  not done  Assets:  Desire for Improvement Financial Resources/Insurance Physical Health Social Support  ADL's:  Intact  Cognition: WNL  Sleep:  Poor   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 Visit from 01/09/2022 in [place] at [place] Visit from 12/13/2020 in [place] [facility] Visit from 10/19/2020 in [place] at [place] Visit from 03/19/2017 in [place] at [place] Visit from 06/22/2015 in [facility] at [facility] Total Score 0 0 0 0 0      [place] Visit from 05/22/2022 in [place] Office Visit from 01/07/2022 in [place] Office Visit from 09/12/2021 in [place] No Risk No Risk No Risk       Assessment and Plan: as follows Prior documentation reviewed  Mood disorder not otherwise specified;fair on pristiq, gets from pcp   Insomnia; doing fair on ambien, will continue   Generalized anxiety disorder; gets anxious feel xanax need be daily , will change to 30 tab instead of 20 per month  Med increased  Side effects reviewed , Direct care time spent in office including face to face 55mn   Fu 3 -463m Follow-up in 3m62madMerian CapronD 9/28/202310:20 AM

## 2022-05-28 ENCOUNTER — Other Ambulatory Visit: Payer: Self-pay | Admitting: Family Medicine

## 2022-05-28 DIAGNOSIS — E785 Hyperlipidemia, unspecified: Secondary | ICD-10-CM

## 2022-07-01 ENCOUNTER — Other Ambulatory Visit (HOSPITAL_COMMUNITY): Payer: Self-pay | Admitting: Psychiatry

## 2022-07-01 DIAGNOSIS — F419 Anxiety disorder, unspecified: Secondary | ICD-10-CM

## 2022-07-14 ENCOUNTER — Encounter: Payer: Self-pay | Admitting: Family Medicine

## 2022-07-14 DIAGNOSIS — E785 Hyperlipidemia, unspecified: Secondary | ICD-10-CM

## 2022-07-14 MED ORDER — ROSUVASTATIN CALCIUM 10 MG PO TABS: 10.0000 mg | ORAL_TABLET | Freq: Every day | ORAL | 0 refills | Status: DC

## 2022-07-21 ENCOUNTER — Other Ambulatory Visit: Payer: Self-pay | Admitting: Family Medicine

## 2022-07-21 MED ORDER — LEVOTHYROXINE SODIUM 150 MCG PO TABS: 150.0000 ug | ORAL_TABLET | Freq: Every day | ORAL | 0 refills | Status: DC

## 2022-07-22 ENCOUNTER — Encounter: Payer: Self-pay | Admitting: Family Medicine

## 2022-07-22 ENCOUNTER — Ambulatory Visit: Payer: BC Managed Care – PPO | Admitting: Family Medicine

## 2022-07-22 ENCOUNTER — Other Ambulatory Visit: Payer: Self-pay | Admitting: Family Medicine

## 2022-07-22 VITALS — BP 122/86 | HR 79 | Temp 98.7°F | Resp 18 | Ht 63.0 in | Wt 184.4 lb

## 2022-07-22 DIAGNOSIS — R109 Unspecified abdominal pain: Secondary | ICD-10-CM | POA: Diagnosis not present

## 2022-07-22 DIAGNOSIS — E1165 Type 2 diabetes mellitus with hyperglycemia: Secondary | ICD-10-CM | POA: Diagnosis not present

## 2022-07-22 DIAGNOSIS — R1011 Right upper quadrant pain: Secondary | ICD-10-CM

## 2022-07-22 DIAGNOSIS — F32A Depression, unspecified: Secondary | ICD-10-CM

## 2022-07-22 DIAGNOSIS — R739 Hyperglycemia, unspecified: Secondary | ICD-10-CM

## 2022-07-22 DIAGNOSIS — R1013 Epigastric pain: Secondary | ICD-10-CM

## 2022-07-22 DIAGNOSIS — E538 Deficiency of other specified B group vitamins: Secondary | ICD-10-CM

## 2022-07-22 DIAGNOSIS — D649 Anemia, unspecified: Secondary | ICD-10-CM | POA: Diagnosis not present

## 2022-07-22 DIAGNOSIS — I10 Essential (primary) hypertension: Secondary | ICD-10-CM

## 2022-07-22 DIAGNOSIS — E039 Hypothyroidism, unspecified: Secondary | ICD-10-CM | POA: Diagnosis not present

## 2022-07-22 DIAGNOSIS — E785 Hyperlipidemia, unspecified: Secondary | ICD-10-CM | POA: Diagnosis not present

## 2022-07-22 LAB — TSH: TSH: 0.7 u[IU]/mL (ref 0.35–5.50)

## 2022-07-22 LAB — VITAMIN B12: Vitamin B-12: <50 pg/mL

## 2022-07-22 LAB — COMPREHENSIVE METABOLIC PANEL
ALT: 16 U/L (ref 0–35)
AST: 18 U/L (ref 0–37)
Albumin: 4.4 g/dL (ref 3.5–5.2)
Alkaline Phosphatase: 51 U/L (ref 39–117)
BUN: 13 mg/dL (ref 6–23)
CO2: 27 mEq/L (ref 19–32)
Calcium: 9.7 mg/dL (ref 8.4–10.5)
Chloride: 102 mEq/L (ref 96–112)
Creatinine, Ser: 0.64 mg/dL (ref 0.40–1.20)
GFR: 100.32 mL/min (ref >60.00)
Glucose, Bld: 79 mg/dL (ref 70–99)
Potassium: 4.9 mEq/L (ref 3.5–5.1)
Sodium: 137 mEq/L (ref 135–145)
Total Bilirubin: 0.3 mg/dL (ref 0.2–1.2)
Total Protein: 7.3 g/dL (ref 6.0–8.3)

## 2022-07-22 LAB — CBC WITH DIFFERENTIAL/PLATELET
Basophils Absolute: 0 10*3/uL (ref 0.0–0.1)
Basophils Relative: 0.9 % (ref 0.0–3.0)
Eosinophils Absolute: 0.2 10*3/uL (ref 0.0–0.7)
Eosinophils Relative: 3 % (ref 0.0–5.0)
HCT: 36.6 % (ref 36.0–46.0)
Hemoglobin: 11.5 g/dL — ABNORMAL LOW (ref 12.0–15.0)
Lymphocytes Relative: 42.3 % (ref 12.0–46.0)
Lymphs Abs: 2.4 10*3/uL (ref 0.7–4.0)
MCHC: 31.4 g/dL (ref 30.0–36.0)
MCV: 72.5 fl — ABNORMAL LOW (ref 78.0–100.0)
Monocytes Absolute: 0.5 10*3/uL (ref 0.1–1.0)
Monocytes Relative: 8.3 % (ref 3.0–12.0)
Neutro Abs: 2.6 10*3/uL (ref 1.4–7.7)
Neutrophils Relative %: 45.5 % (ref 43.0–77.0)
Platelets: 481 10*3/uL — ABNORMAL HIGH (ref 150.0–400.0)
RBC: 5.05 Mil/uL (ref 3.87–5.11)
RDW: 17 % — ABNORMAL HIGH (ref 11.5–15.5)
WBC: 5.6 10*3/uL (ref 4.0–10.5)

## 2022-07-22 LAB — POC URINALSYSI DIPSTICK (AUTOMATED)
Bilirubin, UA: NEGATIVE
Blood, UA: NEGATIVE
Glucose, UA: NEGATIVE
Ketones, UA: NEGATIVE
Leukocytes, UA: NEGATIVE
Nitrite, UA: NEGATIVE
Protein, UA: NEGATIVE
Spec Grav, UA: 1.015 (ref 1.010–1.025)
Urobilinogen, UA: 0.2 E.U./dL
pH, UA: 7.5 (ref 5.0–8.0)

## 2022-07-22 LAB — MICROALBUMIN / CREATININE URINE RATIO
Creatinine,U: 75 mg/dL
Microalb Creat Ratio: 2 mg/g (ref 0.0–30.0)
Microalb, Ur: 1.5 mg/dL (ref 0.0–1.9)

## 2022-07-22 LAB — IBC PANEL
Iron: 28 ug/dL — ABNORMAL LOW (ref 42–145)
Saturation Ratios: 3.8 % — ABNORMAL LOW (ref 20.0–50.0)
TIBC: 730.8 ug/dL — ABNORMAL HIGH (ref 250.0–450.0)
Transferrin: 522 mg/dL — ABNORMAL HIGH (ref 212.0–360.0)

## 2022-07-22 LAB — H. PYLORI ANTIBODY, IGG: H Pylori IgG: NEGATIVE

## 2022-07-22 LAB — LIPID PANEL
Cholesterol: 148 mg/dL (ref 0–200)
HDL: 51.4 mg/dL (ref >39.00)
LDL Cholesterol: 68 mg/dL (ref 0–99)
NonHDL: 96.9
Total CHOL/HDL Ratio: 3
Triglycerides: 143 mg/dL (ref 0.0–149.0)
VLDL: 28.6 mg/dL (ref 0.0–40.0)

## 2022-07-22 LAB — HEMOGLOBIN A1C: Hgb A1c MFr Bld: 6.8 % — ABNORMAL HIGH (ref 4.6–6.5)

## 2022-07-22 MED ORDER — ROSUVASTATIN CALCIUM 10 MG PO TABS: 10.0000 mg | ORAL_TABLET | Freq: Every day | ORAL | 3 refills | Status: DC

## 2022-07-22 MED ORDER — LEVOTHYROXINE SODIUM 150 MCG PO TABS: 150.0000 ug | ORAL_TABLET | Freq: Every day | ORAL | 0 refills | Status: DC

## 2022-07-22 MED ORDER — PANTOPRAZOLE SODIUM 40 MG PO TBEC: 40.0000 mg | DELAYED_RELEASE_TABLET | Freq: Every day | ORAL | 3 refills | Status: DC

## 2022-07-22 MED ORDER — FENOFIBRATE 160 MG PO TABS: 160.0000 mg | ORAL_TABLET | Freq: Every day | ORAL | 1 refills | Status: DC

## 2022-07-22 MED ORDER — AMLODIPINE BESYLATE 5 MG PO TABS: 5.0000 mg | ORAL_TABLET | Freq: Every day | ORAL | 1 refills | Status: DC

## 2022-07-22 MED ORDER — DESVENLAFAXINE SUCCINATE ER 100 MG PO TB24: 100.0000 mg | ORAL_TABLET | Freq: Every day | ORAL | 1 refills | Status: DC

## 2022-07-22 NOTE — Assessment & Plan Note (Signed)
Encourage heart healthy diet such as MIND or DASH diet, increase exercise, avoid trans fats, simple carbohydrates and processed foods, consider a krill or fish or flaxseed oil cap daily.  °

## 2022-07-22 NOTE — Assessment & Plan Note (Signed)
Check labs 

## 2022-07-22 NOTE — Assessment & Plan Note (Signed)
Check labs  Con't synthroid 

## 2022-07-22 NOTE — Assessment & Plan Note (Signed)
Pantoprazole daily Check labs

## 2022-07-22 NOTE — Progress Notes (Signed)
Subjective:   By signing my name below, I, Katie [name], attest that this documentation has been prepared under the direction and in the presence of Katie Held, DO. 07/22/2022    Patient ID: Katie [name], female    DOB: 1966-06-19, 56 y.o.   MRN: [id number]  Chief Complaint  Patient presents with   Hypothyroidism   Hypertension    Hypertension Pertinent negatives include no blurred vision, chest pain, headaches, malaise/fatigue, palpitations or shortness of breath.   Patient is in today for a follow up visit.   She complains of right abdominal pain since this past weekend. Her pain wraps around to her back. She had an episode of sharp pain after stretching to reach an item on the top shelf. She denies any urinary issues. She has mild pain in her right abdomen and gas after eating. She has on prior history of kidney stones. She is taking famotidine regularly to manage her reflux. She finds relief but has occasional reflux.  She continues taking 10 mg Crestor daily PO, 160 mg fenofibrate daily PO and reports no new issues while taking it. She is requesting a refill on them as well.  Lab Results  Component Value Date   CHOL 149 11/01/2021   HDL 52 11/01/2021   LDLCALC 75 11/01/2021   LDLDIRECT 62.0 04/09/2020   TRIG 139 11/01/2021   CHOLHDL 2.9 11/01/2021   She continues taking 5 mg amlodipine daily PO and reports no new issues while taking it. She is requesting refill on it as well.  BP Readings from Last 3 Encounters:  07/22/22 122/86  05/13/22 132/72  01/09/22 130/78   Pulse Readings from Last 3 Encounters:  07/22/22 79  05/13/22 66  01/09/22 83   She continues taking 100 mg pristiq and reports no new issues while taking it. She is requesting a refill on it.  She continues taking 150 mcg synthroid and is requesting a refill on it.  She is not interested in receiving a flu vaccine during this visit.  She is not taking any vitamin B supplements regularly.     Past Medical History:  Diagnosis Date   Allergy    Anemia    Anxiety    Blood transfusion without reported diagnosis    Hyperlipidemia    Hypertension    Migraine    PVC (premature ventricular contraction)    RLS (restless legs syndrome)    Thyroid disease     Past Surgical History:  Procedure Laterality Date   BUNIONECTOMY  06/04/2009   Right foot   CARPAL TUNNEL RELEASE     Bilateral   CRYOABLATION  11/04   ECTOPIC PREGNANCY SURGERY  1998   ENDOMETRIAL ABLATION     SEPTOPLASTY  6/05   TUBAL LIGATION  04/1999   UPPER GASTROINTESTINAL ENDOSCOPY      Family History  Problem Relation Age of Onset   Depression Father        Suicide   Aneurysm Mother        Brain   Hypertension Sister    Thyroid disease Sister    Diabetes Maternal Grandmother    Alzheimer's disease Maternal Grandmother    Uterine cancer Maternal Grandmother    Hypertension Maternal Grandmother    Osteoporosis Maternal Grandmother    Cancer Maternal Grandmother        uterine   Lung cancer Maternal Grandfather    Cancer Maternal Grandfather        lung   Cancer Paternal  Grandmother        metatstatic-- ? 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[employer]  Tobacco Use   Smoking status: Former    Packs/day: 1.00    Types: Cigarettes    Quit date: 01/14/1995    Years since quitting: 27.5   Smokeless tobacco: Never   Tobacco comments:    started smoking at age 80 1ppd  Vaping Use   Vaping Use: Never used  Substance and Sexual Activity   Alcohol use: Yes     Alcohol/week: 2.0 standard drinks of alcohol    Types: 2 Cans of beer per week    Comment: Reports a couple of beers 1- 2 times a month   Drug use: No   Sexual activity: Not Currently    Partners: Male    Comment: 1st intercourse 56 yo-More than 5 partners-BTL  Other Topics Concern   Not on file  Social History Narrative   Exercise-- no   Social Determinants of Health   Financial Resource Strain: Not on file  Food Insecurity: Not on file  Transportation Needs: Not on file  Physical Activity: Not on file  Stress: Not on file  Social Connections: Not on file  Intimate Partner Violence: Not on file    Outpatient Medications Prior to Visit  Medication Sig Dispense Refill   ALPRAZolam (XANAX) 0.25 MG tablet TAKE 1 TABLET BY MOUTH TWICE DAILY AS NEEDED FOR ANXIETY 30 tablet 0   cetirizine (ZYRTEC) 10 MG tablet Take 1 tablet (10 mg total) by mouth daily.     levonorgestrel-ethinyl estradiol (ALESSE) 0.1-20 MG-MCG tablet Take 1 tablet by mouth daily. Take ACTIVE pills only, skipping PLACEBO pills 84 tablet 1   zolpidem (AMBIEN) 10 MG tablet Take 1 tablet (10 mg total) by mouth at bedtime as needed for sleep. TAKE ONE AT NIGHT FOR SLEEP 30 tablet 2   amLODipine (NORVASC) 5 MG tablet Take 1 tablet (5 mg total) by mouth daily. 90 tablet 1   desvenlafaxine (PRISTIQ) 100 MG 24 hr tablet Take 1 tablet (100 mg total) by mouth daily. 90 tablet 1   fenofibrate 160 MG tablet Take 1 tablet (160 mg total) by mouth daily. 90 tablet 1   levothyroxine (SYNTHROID) 150 MCG tablet Take 1 tablet (150 mcg total) by mouth daily before breakfast. 90 tablet 0   rosuvastatin (CRESTOR) 10 MG tablet Take 1 tablet (10 mg total) by mouth daily. 30 tablet 0   cyclobenzaprine (FLEXERIL) 10 MG tablet Take 1 tablet (10 mg total) by mouth 3 (three) times daily as needed for muscle spasms. (Patient not taking: Reported on 07/22/2022) 30 tablet 0   No facility-administered medications prior to visit.    Allergies   Allergen Reactions   Morphine     Nausea & vomiting   Lisinopril     sweating    Review of Systems  Constitutional:  Negative for fever and  malaise/fatigue.  HENT:  Negative for congestion.   Eyes:  Negative for blurred vision.  Respiratory:  Negative for cough and shortness of breath.   Cardiovascular:  Negative for chest pain, palpitations and leg swelling.  Gastrointestinal:  Positive for abdominal pain (right) and heartburn. Negative for vomiting.  Musculoskeletal:  Negative for back pain.  Skin:  Negative for rash.  Neurological:  Negative for loss of consciousness and headaches.       Objective:    Physical Exam Vitals and nursing note reviewed.  Constitutional:      General: She is not in acute distress.    Appearance: Normal appearance. She is not ill-appearing.  HENT:     Head: Normocephalic and atraumatic.     Right Ear: External ear normal.     Left Ear: External ear normal.  Eyes:     Extraocular Movements: Extraocular movements intact.     Pupils: Pupils are equal, round, and reactive to light.  Cardiovascular:     Rate and Rhythm: Normal rate and regular rhythm.     Heart sounds: Normal heart sounds. No murmur heard.    No gallop.  Pulmonary:     Effort: Pulmonary effort is normal. No respiratory distress.     Breath sounds: Normal breath sounds. No wheezing or rales.  Abdominal:     Palpations: Abdomen is soft.     Tenderness: There is abdominal tenderness (right flank) in the epigastric area. There is no guarding or rebound.  Musculoskeletal:        General: Normal range of motion.  Skin:    General: Skin is warm and dry.  Neurological:     General: No focal deficit present.     Mental Status: She is alert. She is disoriented.  Psychiatric:        Mood and Affect: Mood normal.        Behavior: Behavior normal.        Thought Content: Thought content normal.        Judgment: Judgment normal.     BP 122/86 (BP Location: Left Arm, Patient  Position: Sitting, Cuff Size: Normal)   Pulse 79   Temp 98.7 F (37.1 C) (Oral)   Resp 18   Ht '5\' 3"'$  (1.6 m)   Wt 184 lb 6.4 oz (83.6 kg)   SpO2 98%   BMI 32.66 kg/m  Wt Readings from Last 3 Encounters:  07/22/22 184 lb 6.4 oz (83.6 kg)  05/13/22 179 lb (81.2 kg)  01/09/22 182 lb 12.8 oz (82.9 kg)    Diabetic Foot Exam - Simple   No data filed    Lab Results  Component Value Date   WBC 6.0 11/01/2021   HGB 10.8 (L) 11/01/2021   HCT 33.8 (L) 11/01/2021   PLT 498 (H) 11/01/2021   GLUCOSE 72 11/01/2021   CHOL 149 11/01/2021   TRIG 139 11/01/2021   HDL 52 11/01/2021   LDLDIRECT 62.0 04/09/2020   LDLCALC 75 11/01/2021   ALT 10 11/01/2021   AST 15 11/01/2021   NA 135 11/01/2021   K 4.9 11/01/2021   CL 101 11/01/2021   CREATININE 0.67 11/01/2021   BUN 11 11/01/2021   CO2 22 11/01/2021   TSH 2.16 11/01/2021   HGBA1C 6.5 05/13/2021   MICROALBUR 1.2 10/19/2020    Lab Results  Component Value Date   TSH 2.16 11/01/2021   Lab Results  Component Value Date   WBC 6.0 11/01/2021   HGB 10.8 (L) 11/01/2021  HCT 33.8 (L) 11/01/2021   MCV 80.3 11/01/2021   PLT 498 (H) 11/01/2021   Lab Results  Component Value Date   NA 135 11/01/2021   K 4.9 11/01/2021   CO2 22 11/01/2021   GLUCOSE 72 11/01/2021   BUN 11 11/01/2021   CREATININE 0.67 11/01/2021   BILITOT 0.3 11/01/2021   ALKPHOS 50 05/13/2021   AST 15 11/01/2021   ALT 10 11/01/2021   PROT 7.1 11/01/2021   ALBUMIN 4.3 05/13/2021   CALCIUM 9.2 11/01/2021   GFR 88.30 05/13/2021   Lab Results  Component Value Date   CHOL 149 11/01/2021   Lab Results  Component Value Date   HDL 52 11/01/2021   Lab Results  Component Value Date   LDLCALC 75 11/01/2021   Lab Results  Component Value Date   TRIG 139 11/01/2021   Lab Results  Component Value Date   CHOLHDL 2.9 11/01/2021   Lab Results  Component Value Date   HGBA1C 6.5 05/13/2021       Assessment & Plan:   Problem List Items Addressed This  Visit       Unprioritized   Hypothyroidism    Check labs  Con't synthroid       Relevant Medications   levothyroxine (SYNTHROID) 150 MCG tablet   Other Relevant Orders   TSH   Hyperlipidemia LDL goal <100    Encourage heart healthy diet such as MIND or DASH diet, increase exercise, avoid trans fats, simple carbohydrates and processed foods, consider a krill or fish or flaxseed oil cap daily.        Relevant Medications   rosuvastatin (CRESTOR) 10 MG tablet   amLODipine (NORVASC) 5 MG tablet   fenofibrate 160 MG tablet   Other Relevant Orders   Lipid panel   Comprehensive metabolic panel   CBC with Differential/Platelet   IBC panel   TSH   Hyperglycemia    Check labs       HTN (hypertension)    Well controlled, no changes to meds. Encouraged heart healthy diet such as the DASH diet and exercise as tolerated.        Relevant Medications   rosuvastatin (CRESTOR) 10 MG tablet   amLODipine (NORVASC) 5 MG tablet   fenofibrate 160 MG tablet   Other Visit Diagnoses     Essential hypertension    -  Primary   Relevant Medications   rosuvastatin (CRESTOR) 10 MG tablet   amLODipine (NORVASC) 5 MG tablet   fenofibrate 160 MG tablet   Other Relevant Orders   Lipid panel   Comprehensive metabolic panel   CBC with Differential/Platelet   IBC panel   TSH   Anemia, unspecified type       Relevant Orders   Lipid panel   Comprehensive metabolic panel   CBC with Differential/Platelet   IBC panel   TSH   Vitamin B12   Depression, unspecified depression type       Relevant Medications   desvenlafaxine (PRISTIQ) 100 MG 24 hr tablet   Flank pain       Relevant Orders   POCT Urinalysis Dipstick (Automated) (Completed)   Uncontrolled type 2 diabetes mellitus with hyperglycemia (HCC)       Relevant Medications   rosuvastatin (CRESTOR) 10 MG tablet   Other Relevant Orders   Hemoglobin A1c   Microalbumin / creatinine urine ratio   Dyspepsia       Relevant Medications    pantoprazole (PROTONIX) 40 MG tablet  Other Relevant Orders   H. pylori antibody, IgG   RUQ pain       Relevant Orders   US Abdomen Limited RUQ (LIVER/GB)        Meds ordered this encounter  Medications   rosuvastatin (CRESTOR) 10 MG tablet    Sig: Take 1 tablet (10 mg total) by mouth daily.    Dispense:  90 tablet    Refill:  3   amLODipine (NORVASC) 5 MG tablet    Sig: Take 1 tablet (5 mg total) by mouth daily.    Dispense:  90 tablet    Refill:  1   desvenlafaxine (PRISTIQ) 100 MG 24 hr tablet    Sig: Take 1 tablet (100 mg total) by mouth daily.    Dispense:  90 tablet    Refill:  1   fenofibrate 160 MG tablet    Sig: Take 1 tablet (160 mg total) by mouth daily.    Dispense:  90 tablet    Refill:  1   levothyroxine (SYNTHROID) 150 MCG tablet    Sig: Take 1 tablet (150 mcg total) by mouth daily before breakfast.    Dispense:  90 tablet    Refill:  0   pantoprazole (PROTONIX) 40 MG tablet    Sig: Take 1 tablet (40 mg total) by mouth daily.    Dispense:  30 tablet    Refill:  3    I, Katie Held, DO, personally preformed the services described in this documentation.  All medical record entries made by the scribe were at my direction and in my presence.  I have reviewed the chart and discharge instructions (if applicable) and agree that the record reflects my personal performance and is accurate and complete. 07/22/2022   I,Katie [name],acting as a scribe for Katie Held, DO.,have documented all relevant documentation on the behalf of Katie Held, DO,as directed by  Katie Held, DO while in the presence of Katie Held, DO.   Katie Held, DO

## 2022-07-22 NOTE — Assessment & Plan Note (Signed)
Well controlled, no changes to meds. Encouraged heart healthy diet such as the DASH diet and exercise as tolerated.  °

## 2022-07-24 ENCOUNTER — Ambulatory Visit (HOSPITAL_BASED_OUTPATIENT_CLINIC_OR_DEPARTMENT_OTHER)
Admission: RE | Admit: 2022-07-24 | Discharge: 2022-07-24 | Disposition: A | Discharge status: Home / Self Care | Payer: BC Managed Care – PPO | Source: Ambulatory Visit | Attending: Family Medicine | Admitting: Family Medicine

## 2022-07-24 DIAGNOSIS — R1011 Right upper quadrant pain: Secondary | ICD-10-CM | POA: Insufficient documentation

## 2022-07-29 ENCOUNTER — Telehealth (HOSPITAL_COMMUNITY): Payer: Self-pay | Admitting: Psychiatry

## 2022-07-29 ENCOUNTER — Ambulatory Visit (INDEPENDENT_AMBULATORY_CARE_PROVIDER_SITE_OTHER): Payer: BC Managed Care – PPO

## 2022-07-29 DIAGNOSIS — E538 Deficiency of other specified B group vitamins: Secondary | ICD-10-CM | POA: Diagnosis not present

## 2022-07-29 MED ORDER — ZOLPIDEM TARTRATE 10 MG PO TABS: 10.0000 mg | ORAL_TABLET | Freq: Every evening | ORAL | 1 refills | Status: DC | PRN

## 2022-07-29 MED ORDER — CYANOCOBALAMIN 1000 MCG/ML IJ SOLN
1000.0000 ug | Freq: Once | INTRAMUSCULAR | Status: AC
  Administered 2022-07-29: 1000 ug via INTRAMUSCULAR

## 2022-07-29 NOTE — Telephone Encounter (Signed)
Patient called requesting refill of:  zolpidem (AMBIEN) 10 MG tablet   [facility] - [place], [place] Phone: [phone]  Fax: [phone]     Last ordered: 04/30/2022 30 tablets with 2 refills  Last visit: 05/22/2022 Next visit: 09/23/2022

## 2022-07-29 NOTE — Telephone Encounter (Signed)
Medication management - Message left for patient that Dr. De Nurse had sent in her requested new Zolpidem 10 mg order to her [facility] and to call back if any questions.

## 2022-07-29 NOTE — Progress Notes (Signed)
Katie [name] is a 56 y.o. female presents to the office today for 1/4 weekly B12 injection per physician's orders. Original order: 07/22/22: "she may benefit from b12 injection weekly x4 then monthly, Recheck 1 month" Cyanocobalamin 1000 mg/ml  IM (route) was administered L deltoid (location) today. Patient tolerated injection.  Patient due for follow up labs/provider appt: No.  Patient next injection due: 1 week, appt made Yes  Creft, Kristine Garbe L

## 2022-07-30 ENCOUNTER — Other Ambulatory Visit: Payer: Self-pay | Admitting: *Deleted

## 2022-07-30 ENCOUNTER — Other Ambulatory Visit: Payer: Self-pay

## 2022-07-30 DIAGNOSIS — D649 Anemia, unspecified: Secondary | ICD-10-CM

## 2022-07-30 DIAGNOSIS — E538 Deficiency of other specified B group vitamins: Secondary | ICD-10-CM

## 2022-07-30 DIAGNOSIS — R1011 Right upper quadrant pain: Secondary | ICD-10-CM

## 2022-07-30 NOTE — Addendum Note (Signed)
Addended byDamita Dunnings D on: 07/30/2022 04:03 PM   Modules accepted: Orders

## 2022-08-01 ENCOUNTER — Ambulatory Visit (HOSPITAL_BASED_OUTPATIENT_CLINIC_OR_DEPARTMENT_OTHER): Payer: BC Managed Care – PPO

## 2022-08-04 NOTE — Progress Notes (Unsigned)
Katie [name] is a 56 y.o. female presents to the office today for 2/4 weekly B12 injection per physician's orders. Original order: 07/22/22: "she may benefit from b12 injection weekly x4 then monthly, Recheck 1 month" Cyanocobalamin 1000 mg/ml  IM (route) was administered L deltoid (location) today. Patient tolerated injection.  Patient due for follow up labs/provider appt: No.  Patient next injection due: 1 week, appt made Yes   Creft, Kristine Garbe L

## 2022-08-05 ENCOUNTER — Ambulatory Visit (INDEPENDENT_AMBULATORY_CARE_PROVIDER_SITE_OTHER): Payer: BC Managed Care – PPO

## 2022-08-05 DIAGNOSIS — E538 Deficiency of other specified B group vitamins: Secondary | ICD-10-CM

## 2022-08-05 MED ORDER — CYANOCOBALAMIN 1000 MCG/ML IJ SOLN
1000.0000 ug | Freq: Once | INTRAMUSCULAR | Status: AC
  Administered 2022-08-05: 1000 ug via INTRAMUSCULAR

## 2022-08-11 ENCOUNTER — Ambulatory Visit (HOSPITAL_BASED_OUTPATIENT_CLINIC_OR_DEPARTMENT_OTHER): Payer: BC Managed Care – PPO

## 2022-08-12 ENCOUNTER — Ambulatory Visit (INDEPENDENT_AMBULATORY_CARE_PROVIDER_SITE_OTHER): Payer: BC Managed Care – PPO | Admitting: *Deleted

## 2022-08-12 DIAGNOSIS — E538 Deficiency of other specified B group vitamins: Secondary | ICD-10-CM | POA: Diagnosis not present

## 2022-08-12 MED ORDER — CYANOCOBALAMIN 1000 MCG/ML IJ SOLN
1000.0000 ug | Freq: Once | INTRAMUSCULAR | Status: AC
  Administered 2022-08-12: 1000 ug via INTRAMUSCULAR

## 2022-08-12 NOTE — Progress Notes (Addendum)
Patient here for 3/4 weekly b12 injection per physicians order.  Injection given in left deltoid and patient tolerated well.

## 2022-08-13 ENCOUNTER — Encounter: Payer: Self-pay | Admitting: Family Medicine

## 2022-08-15 ENCOUNTER — Telehealth (HOSPITAL_COMMUNITY): Payer: Self-pay | Admitting: Psychiatry

## 2022-08-15 ENCOUNTER — Telehealth (HOSPITAL_COMMUNITY): Payer: Self-pay

## 2022-08-15 DIAGNOSIS — F419 Anxiety disorder, unspecified: Secondary | ICD-10-CM

## 2022-08-15 MED ORDER — ALPRAZOLAM 0.25 MG PO TABS: 0.2500 mg | ORAL_TABLET | Freq: Two times a day (BID) | ORAL | 0 refills | Status: DC | PRN

## 2022-08-15 NOTE — Telephone Encounter (Signed)
Medication refill request - Fax from patient's [facility] for a new Alprazolam order, last provided 07/01/22 with no refills and patient returns next on 09/23/22.

## 2022-08-15 NOTE — Telephone Encounter (Signed)
Medication refill - Message left for patient that a new Alprazolam order had been sent into her [facility] as requested.

## 2022-08-15 NOTE — Telephone Encounter (Signed)
Patient called requesting refill of:  ALPRAZolam Duanne Moron) 0.25 MG tablet   [facility], [place] (Ph: [phone])   Last ordered: 07/01/2022  30 tablets  Last visit:  05/22/2022  Next visit: 09/23/2022

## 2022-08-20 ENCOUNTER — Other Ambulatory Visit: Payer: BC Managed Care – PPO

## 2022-08-20 ENCOUNTER — Ambulatory Visit (INDEPENDENT_AMBULATORY_CARE_PROVIDER_SITE_OTHER): Payer: BC Managed Care – PPO

## 2022-08-20 DIAGNOSIS — E538 Deficiency of other specified B group vitamins: Secondary | ICD-10-CM | POA: Diagnosis not present

## 2022-08-20 MED ORDER — CYANOCOBALAMIN 1000 MCG/ML IJ SOLN
1000.0000 ug | Freq: Once | INTRAMUSCULAR | Status: AC
  Administered 2022-08-20: 1000 ug via INTRAMUSCULAR

## 2022-08-20 NOTE — Progress Notes (Signed)
Pt here for 4/4 weekly B12 injection per Dr. Carollee Herter  B12 1061mg given IM in left deltoid , and pt tolerated injection well.  Next B12 injection scheduled for next month

## 2022-08-26 ENCOUNTER — Other Ambulatory Visit (INDEPENDENT_AMBULATORY_CARE_PROVIDER_SITE_OTHER): Payer: BC Managed Care – PPO

## 2022-08-26 ENCOUNTER — Telehealth: Payer: Self-pay

## 2022-08-26 DIAGNOSIS — D649 Anemia, unspecified: Secondary | ICD-10-CM

## 2022-08-26 DIAGNOSIS — E538 Deficiency of other specified B group vitamins: Secondary | ICD-10-CM

## 2022-08-26 LAB — IBC + FERRITIN
Ferritin: 3.7 ng/mL — ABNORMAL LOW (ref 10.0–291.0)
Iron: 36 ug/dL — ABNORMAL LOW (ref 42–145)
Saturation Ratios: 5.1 % — ABNORMAL LOW (ref 20.0–50.0)
TIBC: 701.4 ug/dL — ABNORMAL HIGH (ref 250.0–450.0)
Transferrin: 501 mg/dL — ABNORMAL HIGH (ref 212.0–360.0)

## 2022-08-26 LAB — COMPREHENSIVE METABOLIC PANEL
ALT: 16 U/L (ref 0–35)
AST: 18 U/L (ref 0–37)
Albumin: 4.2 g/dL (ref 3.5–5.2)
Alkaline Phosphatase: 55 U/L (ref 39–117)
BUN: 12 mg/dL (ref 6–23)
CO2: 24 mEq/L (ref 19–32)
Calcium: 9.3 mg/dL (ref 8.4–10.5)
Chloride: 103 mEq/L (ref 96–112)
Creatinine, Ser: 0.68 mg/dL (ref 0.40–1.20)
GFR: 98.79 mL/min (ref >60.00)
Glucose, Bld: 109 mg/dL — ABNORMAL HIGH (ref 70–99)
Potassium: 4.6 mEq/L (ref 3.5–5.1)
Sodium: 137 mEq/L (ref 135–145)
Total Bilirubin: 0.4 mg/dL (ref 0.2–1.2)
Total Protein: 7 g/dL (ref 6.0–8.3)

## 2022-08-26 LAB — CBC WITH DIFFERENTIAL/PLATELET
Basophils Absolute: 0 10*3/uL (ref 0.0–0.1)
Basophils Relative: 0.5 % (ref 0.0–3.0)
Eosinophils Absolute: 0.2 10*3/uL (ref 0.0–0.7)
Eosinophils Relative: 1.9 % (ref 0.0–5.0)
HCT: 36.5 % (ref 36.0–46.0)
Hemoglobin: 11.5 g/dL — ABNORMAL LOW (ref 12.0–15.0)
Lymphocytes Relative: 23 % (ref 12.0–46.0)
Lymphs Abs: 1.8 10*3/uL (ref 0.7–4.0)
MCHC: 31.4 g/dL (ref 30.0–36.0)
MCV: 71.5 fl — ABNORMAL LOW (ref 78.0–100.0)
Monocytes Absolute: 0.5 10*3/uL (ref 0.1–1.0)
Monocytes Relative: 5.8 % (ref 3.0–12.0)
Neutro Abs: 5.4 10*3/uL (ref 1.4–7.7)
Neutrophils Relative %: 68.8 % (ref 43.0–77.0)
Platelets: 408 10*3/uL — ABNORMAL HIGH (ref 150.0–400.0)
RBC: 5.11 Mil/uL (ref 3.87–5.11)
RDW: 15.8 % — ABNORMAL HIGH (ref 11.5–15.5)
WBC: 7.8 10*3/uL (ref 4.0–10.5)

## 2022-08-26 LAB — VITAMIN B12: Vitamin B-12: 250 pg/mL (ref 211–911)

## 2022-08-26 NOTE — Telephone Encounter (Signed)
PA initiated via Covermymeds; KEY: [id number]. Awaiting determination.

## 2022-08-26 NOTE — Telephone Encounter (Signed)
PA denied. Awaiting denial information, however more than likely will require Pt to use OTC's

## 2022-08-27 NOTE — Telephone Encounter (Signed)
Pantoprazole is covered when Pt has tried and failed 2 over-the-counter PPIs.

## 2022-09-01 NOTE — Telephone Encounter (Signed)
PT called. LVM

## 2022-09-02 ENCOUNTER — Encounter: Payer: Self-pay | Admitting: Family Medicine

## 2022-09-02 NOTE — Telephone Encounter (Signed)
Please confirm correct order was placed

## 2022-09-05 ENCOUNTER — Encounter: Payer: Self-pay | Admitting: Family Medicine

## 2022-09-05 ENCOUNTER — Ambulatory Visit (INDEPENDENT_AMBULATORY_CARE_PROVIDER_SITE_OTHER): Payer: BC Managed Care – PPO | Admitting: Family Medicine

## 2022-09-05 VITALS — BP 134/86 | HR 89 | Temp 98.3°F | Resp 18 | Ht 63.0 in | Wt 182.8 lb

## 2022-09-05 DIAGNOSIS — R1013 Epigastric pain: Secondary | ICD-10-CM | POA: Diagnosis not present

## 2022-09-05 DIAGNOSIS — R1011 Right upper quadrant pain: Secondary | ICD-10-CM | POA: Diagnosis not present

## 2022-09-05 MED ORDER — SUCRALFATE 1 G PO TABS: 1.0000 g | ORAL_TABLET | Freq: Three times a day (TID) | ORAL | 1 refills | Status: DC

## 2022-09-05 NOTE — Patient Instructions (Signed)

## 2022-09-05 NOTE — Assessment & Plan Note (Signed)
Not improved with protonix or zantac Rx carafate

## 2022-09-05 NOTE — Progress Notes (Signed)
Subjective:   By signing my name below, I, [name], attest that this documentation has been prepared under the direction and in the presence of [name], DO. 09/05/2022   Patient ID: Katie [name], female    DOB: 1965/12/03, 57 y.o.   MRN: [id number]  Chief Complaint  Patient presents with   Abdominal Pain    Pt states abd pain has not improved. Pt states being sick this week from Sunday to Tuesday. Pt states unable to lay on right side. She states was having reflux, no appetite, nausea, no vomiting, and abd pain and sweats. Negative COVID on Monday   Follow-up    HPI Patient is in today for a follow-up visit.  She is still waiting for pre-approval for her abdomen/pelvis CAT scan. She has stopped taking Zantac, zyrtec, and Protonix due to abdominal pain. This pain forced her to stay out of work and rest in bed. Sleeping on her left side helps the pain. She was experiencing nausea. Fatty foods make the pain worse. Bland foods improve pain. She displayed tenderness in the mid-epigastric region upon palpation. She denies bloating.  Liver function was normal during last blood work. Lab Results  Component Value Date   ALT 16 08/26/2022   AST 18 08/26/2022   ALKPHOS 55 08/26/2022   BILITOT 0.4 08/26/2022       Past Medical History:  Diagnosis Date   Allergy    Anemia    Anxiety    Blood transfusion without reported diagnosis    Hyperlipidemia    Hypertension    Migraine    PVC (premature ventricular contraction)    RLS (restless legs syndrome)    Thyroid disease     Past Surgical History:  Procedure Laterality Date   BUNIONECTOMY  06/04/2009   Right foot   CARPAL TUNNEL RELEASE     Bilateral   CRYOABLATION  11/04   [place]   ENDOMETRIAL ABLATION     SEPTOPLASTY  6/05   TUBAL LIGATION  04/1999   UPPER GASTROINTESTINAL ENDOSCOPY      Family History  Problem Relation Age of Onset   Depression Father        Suicide    Aneurysm Mother        Brain   Hypertension Sister    Thyroid disease Sister    Diabetes Maternal Grandmother    Alzheimer's disease Maternal Grandmother    Uterine cancer Maternal Grandmother    Hypertension Maternal Grandmother    Osteoporosis Maternal Grandmother    Cancer Maternal Grandmother        uterine   Lung cancer Maternal Grandfather    Cancer Maternal Grandfather        lung   Cancer Paternal Grandmother        metatstatic-- ? 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[employer]  Tobacco Use   Smoking  status: Former    Packs/day: 1.00    Types: Cigarettes    Quit date: 01/14/1995    Years since quitting: 27.6   Smokeless tobacco: Never   Tobacco comments:    started smoking at age 76 1ppd  Vaping Use   Vaping Use: Never used  Substance and Sexual Activity   Alcohol use: Yes    Alcohol/week: 2.0 standard drinks of alcohol    Types: 2 Cans of beer per week    Comment: Reports a couple of beers 1- 2 times a month   Drug use: No   Sexual activity: Not Currently    Partners: Male    Comment: 1st intercourse 57 yo-More than 5 partners-BTL  Other Topics Concern   Not on file  Social History Narrative   Exercise-- no   Social Determinants of Health   Financial Resource Strain: Not on file  Food Insecurity: Not on file  Transportation Needs: Not on file  Physical Activity: Not on file  Stress: Not on file  Social Connections: Not on file  Intimate Partner Violence: Not on file    Outpatient Medications Prior to Visit  Medication Sig  Dispense Refill   ALPRAZolam (XANAX) 0.25 MG tablet Take 1 tablet (0.25 mg total) by mouth 2 (two) times daily as needed. for anxiety 30 tablet 0   amLODipine (NORVASC) 5 MG tablet Take 1 tablet (5 mg total) by mouth daily. 90 tablet 1   cetirizine (ZYRTEC) 10 MG tablet Take 1 tablet (10 mg total) by mouth daily.     desvenlafaxine (PRISTIQ) 100 MG 24 hr tablet Take 1 tablet (100 mg total) by mouth daily. 90 tablet 1   fenofibrate 160 MG tablet Take 1 tablet (160 mg total) by mouth daily. 90 tablet 1   levonorgestrel-ethinyl estradiol (ALESSE) 0.1-20 MG-MCG tablet Take 1 tablet by mouth daily. Take ACTIVE pills only, skipping PLACEBO pills 84 tablet 1   levothyroxine (SYNTHROID) 150 MCG tablet Take 1 tablet (150 mcg total) by mouth daily before breakfast. 90 tablet 0   pantoprazole (PROTONIX) 40 MG tablet Take 1 tablet (40 mg total) by mouth daily. 30 tablet 3   rosuvastatin (CRESTOR) 10 MG tablet Take 1 tablet (10 mg total) by mouth daily. 90 tablet 3   zolpidem (AMBIEN) 10 MG tablet Take 1 tablet (10 mg total) by mouth at bedtime as needed for sleep. TAKE ONE AT NIGHT FOR SLEEP 30 tablet 1   No facility-administered medications prior to visit.    Allergies  Allergen Reactions   Morphine     Nausea & vomiting   Lisinopril     sweating    Review of Systems  Constitutional:  Negative for fever and malaise/fatigue.  HENT:  Negative for congestion.   Eyes:  Negative for blurred vision.  Respiratory:  Negative for cough and shortness of breath.   Cardiovascular:  Negative for chest pain, palpitations and leg swelling.  Gastrointestinal:  Positive for abdominal pain (Worse on the right side). Negative for vomiting.  Musculoskeletal:  Negative for back pain.  Skin:  Negative for rash.  Neurological:  Negative for loss of consciousness and headaches.       Objective:    Physical Exam Vitals and nursing note reviewed.  Constitutional:      Appearance: Normal appearance.  HENT:      Head: Normocephalic and atraumatic.     Right Ear: External ear normal.     Left Ear: External ear normal.  Eyes:  Extraocular Movements: Extraocular movements intact.     Pupils: Pupils are equal, round, and reactive to light.  Cardiovascular:     Rate and Rhythm: Normal rate and regular rhythm.     Heart sounds: Normal heart sounds. No murmur heard.    No gallop.  Pulmonary:     Effort: Pulmonary effort is normal. No respiratory distress.     Breath sounds: Normal breath sounds. No wheezing or rales.  Abdominal:     Tenderness: There is abdominal tenderness in the epigastric area. There is no guarding or rebound. Negative signs include Murphy's sign.  Neurological:     Mental Status: She is alert and oriented to person, place, and time.  Psychiatric:        Mood and Affect: Mood normal.     BP 134/86 (BP Location: Left Arm, Patient Position: Sitting, Cuff Size: Normal)   Pulse 89   Temp 98.3 F (36.8 C) (Oral)   Resp 18   Ht '5\' 3"'$  (1.6 m)   Wt 182 lb 12.8 oz (82.9 kg)   SpO2 97%   BMI 32.38 kg/m  Wt Readings from Last 3 Encounters:  09/05/22 182 lb 12.8 oz (82.9 kg)  07/22/22 184 lb 6.4 oz (83.6 kg)  05/13/22 179 lb (81.2 kg)       Assessment & Plan:  RUQ abdominal pain Assessment & Plan: Inc with fatty foods  Check hida scan    Orders: -     NM Hepato W/EF; Future  Dyspepsia Assessment & Plan: Not improved with protonix or zantac Rx carafate  Orders: -     Sucralfate; Take 1 tablet (1 g total) by mouth 4 (four) times daily -  with meals and at bedtime.  Dispense: 45 tablet; Refill: 1  Midepigastric pain Assessment & Plan: Not improved with protonix or zantac Rx carafate     I, [name], DO, personally preformed the services described in this documentation.  All medical record entries made by the scribe were at my direction and in my presence.  I have reviewed the chart and discharge instructions (if applicable) and agree that the  record reflects my personal performance and is accurate and complete. 09/05/2022   I,[name],acting as a scribe for [name], DO.,have documented all relevant documentation on the behalf of [name], DO,as directed by  [name], DO while in the presence of [name], DO.   [name], DO

## 2022-09-05 NOTE — Assessment & Plan Note (Signed)
Inc with fatty foods  Check hida scan

## 2022-09-15 ENCOUNTER — Ambulatory Visit (HOSPITAL_COMMUNITY)
Admission: RE | Admit: 2022-09-15 | Discharge: 2022-09-15 | Disposition: A | Discharge status: Home / Self Care | Payer: BC Managed Care – PPO | Source: Ambulatory Visit | Attending: Family Medicine | Admitting: Family Medicine

## 2022-09-15 DIAGNOSIS — R1011 Right upper quadrant pain: Secondary | ICD-10-CM | POA: Diagnosis not present

## 2022-09-15 MED ORDER — TECHNETIUM TC 99M MEBROFENIN IV KIT
5.1000 | PACK | Freq: Once | INTRAVENOUS | Status: AC | PRN
  Administered 2022-09-15: 5.1 via INTRAVENOUS

## 2022-09-17 ENCOUNTER — Encounter: Payer: Self-pay | Admitting: Family Medicine

## 2022-09-18 ENCOUNTER — Other Ambulatory Visit: Payer: Self-pay

## 2022-09-18 DIAGNOSIS — R1011 Right upper quadrant pain: Secondary | ICD-10-CM

## 2022-09-22 ENCOUNTER — Other Ambulatory Visit (HOSPITAL_COMMUNITY): Payer: Self-pay | Admitting: Psychiatry

## 2022-09-23 ENCOUNTER — Ambulatory Visit (INDEPENDENT_AMBULATORY_CARE_PROVIDER_SITE_OTHER): Payer: BC Managed Care – PPO | Admitting: Psychiatry

## 2022-09-23 ENCOUNTER — Encounter (HOSPITAL_COMMUNITY): Payer: Self-pay | Admitting: Psychiatry

## 2022-09-23 VITALS — BP 135/89 | HR 82 | Ht 64.0 in | Wt 180.0 lb

## 2022-09-23 DIAGNOSIS — F411 Generalized anxiety disorder: Secondary | ICD-10-CM

## 2022-09-23 DIAGNOSIS — F5102 Adjustment insomnia: Secondary | ICD-10-CM | POA: Diagnosis not present

## 2022-09-23 DIAGNOSIS — F419 Anxiety disorder, unspecified: Secondary | ICD-10-CM

## 2022-09-23 DIAGNOSIS — F0630 Mood disorder due to known physiological condition, unspecified: Secondary | ICD-10-CM | POA: Diagnosis not present

## 2022-09-23 MED ORDER — ALPRAZOLAM 0.25 MG PO TABS: 0.2500 mg | ORAL_TABLET | Freq: Every day | ORAL | 1 refills | Status: DC | PRN

## 2022-09-23 NOTE — Progress Notes (Signed)
[place] Follow up visit  Patient Identification: Katie [name] MRN:  [id number] Date of Evaluation:  09/23/2022 Referral Source: primary care Chief Complaint:  follow up insomnia Chief Complaint   Follow-up     Visit Diagnosis:    ICD-10-CM   1. Mood disorder in conditions classified elsewhere  F06.30     2. GAD (generalized anxiety disorder)  F41.1     3. Adjustment insomnia  F51.02     4. 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 currently she is living with her husband  Patient has suffered from insomnia since young age    Doing fair, sleeps fair on [place], also on xanax for prn anxiety  Getting GI eval done for gastric symptoms    Depression stable  Stress varies but manageable with pristiq, xanax   Aggravating factors; job can be,   distant relationship with son Modifying factors ; son  Duration since young age Severity manageable but gets anxious and need xanax during weekends as well  She was seen at age 67 by her counselor after her father's death 1 time no medication given       Past Psychiatric History: insomnia, anxiety  Previous Psychotropic Medications: Yes  Zoloft, seroquel   Past Medical History:  Past Medical History:  Diagnosis Date   Allergy    Anemia    Anxiety    Blood transfusion without reported diagnosis    Hyperlipidemia    Hypertension    Migraine    PVC (premature ventricular contraction)    RLS (restless legs syndrome)    Thyroid disease     Past Surgical History:  Procedure Laterality Date   BUNIONECTOMY  06/04/2009   Right foot   CARPAL TUNNEL RELEASE     Bilateral   CRYOABLATION  11/04   ECTOPIC PREGNANCY SURGERY  1998   ENDOMETRIAL ABLATION      SEPTOPLASTY  6/05   TUBAL LIGATION  04/1999   UPPER GASTROINTESTINAL ENDOSCOPY      Family Psychiatric History: Dad: committed suicide, see chart , Niece; depression  Family History:  Family History  Problem Relation Age of Onset   Depression Father        Suicide   Aneurysm Mother        Brain   Hypertension Sister    Thyroid disease Sister    Diabetes Maternal Grandmother    Alzheimer's disease Maternal Grandmother    Uterine cancer Maternal Grandmother    Hypertension Maternal Grandmother    Osteoporosis Maternal Grandmother    Cancer Maternal Grandmother        uterine   Lung cancer Maternal Grandfather    Cancer Maternal Grandfather        lung   Cancer Paternal Grandmother        metatstatic-- ? primary colon   Cancer Paternal Grandfather        skin, hepatic   Colon cancer Paternal Grandfather        age unknown   Colon polyps Neg Hx    Rectal cancer Neg Hx    Stomach cancer Neg Hx    Esophageal cancer Neg Hx     Social History:   Social History   Socioeconomic History  Marital status: Married    Spouse name: Not on file   Number of children: 2   Years of education: 15   Highest education level: Not on file  Occupational History   Occupation: scheduler [employer]  Tobacco Use   Smoking status: Former    Packs/day: 1.00    Types: Cigarettes    Quit date: 01/14/1995    Years since quitting: 27.7   Smokeless tobacco: Never   Tobacco comments:    started smoking at age 82 1ppd  Vaping Use   Vaping Use: Never used  Substance and Sexual Activity   Alcohol use: Yes    Alcohol/week: 2.0 standard drinks of alcohol    Types: 2 Cans of beer per week    Comment: Reports a couple of beers 1- 2 times a month   Drug use: No   Sexual activity: Not Currently    Partners: Male    Comment: 1st intercourse 57 yo-More than 5 partners-BTL  Other Topics Concern   Not on file  Social History Narrative   Exercise-- no   Social Determinants of Health    Financial Resource Strain: Not on file  Food Insecurity: Not on file  Transportation Needs: Not on file  Physical Activity: Not on file  Stress: Not on file  Social Connections: Not on file     Allergies:   Allergies  Allergen Reactions   Morphine     Nausea & vomiting   Lisinopril     sweating    Metabolic Disorder Labs: Lab Results  Component Value Date   HGBA1C 6.8 (H) 07/22/2022   No results found for: "PROLACTIN" Lab Results  Component Value Date   CHOL 148 07/22/2022   TRIG 143.0 07/22/2022   HDL 51.40 07/22/2022   CHOLHDL 3 07/22/2022   VLDL 28.6 07/22/2022   LDLCALC 68 07/22/2022   LDLCALC 75 11/01/2021   Lab Results  Component Value Date   TSH 0.70 07/22/2022    Therapeutic Level Labs: No results found for: "LITHIUM" No results found for: "CBMZ" No results found for: "VALPROATE"  Current Medications: Current Outpatient Medications  Medication Sig Dispense Refill   amLODipine (NORVASC) 5 MG tablet Take 1 tablet (5 mg total) by mouth daily. 90 tablet 1   cetirizine (ZYRTEC) 10 MG tablet Take 1 tablet (10 mg total) by mouth daily.     desvenlafaxine (PRISTIQ) 100 MG 24 hr tablet Take 1 tablet (100 mg total) by mouth daily. 90 tablet 1   fenofibrate 160 MG tablet Take 1 tablet (160 mg total) by mouth daily. 90 tablet 1   levonorgestrel-ethinyl estradiol (ALESSE) 0.1-20 MG-MCG tablet Take 1 tablet by mouth daily. Take ACTIVE pills only, skipping PLACEBO pills 84 tablet 1   levothyroxine (SYNTHROID) 150 MCG tablet Take 1 tablet (150 mcg total) by mouth daily before breakfast. 90 tablet 0   pantoprazole (PROTONIX) 40 MG tablet Take 1 tablet (40 mg total) by mouth daily. 30 tablet 3   rosuvastatin (CRESTOR) 10 MG tablet Take 1 tablet (10 mg total) by mouth daily. 90 tablet 3   sucralfate (CARAFATE) 1 g tablet Take 1 tablet (1 g total) by mouth 4 (four) times daily -  with meals and at bedtime. 45 tablet 1   zolpidem (AMBIEN) 10 MG tablet TAKE 1 TABLET BY  MOUTH AT BEDTIME AS NEEDED FOR SLEEP 30 tablet 1   ALPRAZolam (XANAX) 0.25 MG tablet Take 1 tablet (0.25 mg total) by mouth daily as needed. for anxiety 30  tablet 1   No current facility-administered medications for this visit.      Psychiatric Specialty Exam: Review of Systems  Cardiovascular:  Negative for chest pain and palpitations.  Neurological:  Negative for tremors.  Psychiatric/Behavioral:  Negative for self-injury and suicidal ideas.     Blood pressure 135/89, pulse 82, height '5\' 4"'$  (1.626 m), weight 180 lb (81.6 kg).Body mass index is 30.9 kg/m.  General Appearance: Casual  Eye Contact:  Fair  Speech:  Clear and Coherent  Volume:  Normal  Mood:  Euthymic  Affect:  Full Range  Thought Process:  Goal Directed  Orientation:  Full (Time, Place, and Person)  Thought Content:  Rumination  Suicidal Thoughts:  No  Homicidal Thoughts:  No  Memory:  Immediate;   Fair Recent;   Fair  Judgement:  Fair  Insight:  Fair  Psychomotor Activity:  Normal  Concentration:  Concentration: Fair and Attention Span: Fair  Recall:  [employer] of Knowledge:Good  Language: Good  Akathisia:  No  Handed:    AIMS (if indicated):  not done  Assets:  Desire for Improvement Financial Resources/Insurance Physical Health Social Support  ADL's:  Intact  Cognition: WNL  Sleep:  Poor   Screenings: PHQ2-9    Flowsheet Row Office Visit from 01/09/2022 in [facility] [place] Visit from 12/13/2020 in [place] at [place] Visit from 10/19/2020 in [facility] [place] Visit from 03/19/2017 in [facility] [place] Visit from 06/22/2015 in [facility] Total Score 0 0 0 0 [place] Visit from 05/22/2022 in [place] at [place] Visit  from 01/07/2022 in [place] at [place] Visit from 09/12/2021 in [place] at [place] No Risk No Risk No Risk       Assessment and Plan: as follows  Prior documentation reviewed  Mood disorder not otherwise specified; fair on pristiq,    Insomnia; doing stable on ambien, also on prn xanax   Generalized anxiety disorder; manageable with pristiq, xanax,    Side effects reviewed , Direct care time spent in office: including face to face, chart review, documentation 15 - 20 min   Fu 19m  NMerian Capron MD 1/30/202410:44 AM

## 2022-09-23 NOTE — Telephone Encounter (Signed)
Ambien refill sent

## 2022-09-24 ENCOUNTER — Other Ambulatory Visit: Payer: Self-pay | Admitting: Family Medicine

## 2022-09-24 ENCOUNTER — Encounter: Payer: Self-pay | Admitting: Nurse Practitioner

## 2022-09-24 DIAGNOSIS — E039 Hypothyroidism, unspecified: Secondary | ICD-10-CM

## 2022-09-24 DIAGNOSIS — Z3041 Encounter for surveillance of contraceptive pills: Secondary | ICD-10-CM

## 2022-09-24 DIAGNOSIS — N920 Excessive and frequent menstruation with regular cycle: Secondary | ICD-10-CM

## 2022-09-24 DIAGNOSIS — G43829 Menstrual migraine, not intractable, without status migrainosus: Secondary | ICD-10-CM

## 2022-09-25 ENCOUNTER — Other Ambulatory Visit: Payer: Self-pay | Admitting: Nurse Practitioner

## 2022-09-25 MED ORDER — LEVOTHYROXINE SODIUM 150 MCG PO TABS: 150.0000 ug | ORAL_TABLET | Freq: Every day | ORAL | 0 refills | Status: DC

## 2022-09-25 MED ORDER — LEVONORGESTREL-ETHINYL ESTRAD 0.1-20 MG-MCG PO TABS: 1.0000 | ORAL_TABLET | Freq: Every day | ORAL | 2 refills | Status: DC

## 2022-09-25 NOTE — Telephone Encounter (Signed)
Refills sent. Thank you!

## 2022-09-25 NOTE — Telephone Encounter (Signed)
Pt taking continuous COCs x 2 months.  Last AEX 05/13/2022--recall placed for 2024. Last mammo 11/18/2021-neg birads 1  Rx sent only for #84 w/ 1 refill on 05/13/2022 due to pt considered switching to alternate hormonal contraception. Will inquire from pt before sending to provider.

## 2022-09-26 ENCOUNTER — Ambulatory Visit (HOSPITAL_BASED_OUTPATIENT_CLINIC_OR_DEPARTMENT_OTHER)
Admission: RE | Admit: 2022-09-26 | Discharge: 2022-09-26 | Disposition: A | Discharge status: Home / Self Care | Payer: BC Managed Care – PPO | Source: Ambulatory Visit | Attending: Family Medicine | Admitting: Family Medicine

## 2022-09-26 DIAGNOSIS — R1011 Right upper quadrant pain: Secondary | ICD-10-CM | POA: Insufficient documentation

## 2022-09-26 DIAGNOSIS — D259 Leiomyoma of uterus, unspecified: Secondary | ICD-10-CM | POA: Diagnosis not present

## 2022-09-26 MED ORDER — IOHEXOL 300 MG/ML  SOLN
100.0000 mL | Freq: Once | INTRAMUSCULAR | Status: AC | PRN
  Administered 2022-09-26: 100 mL via INTRAVENOUS

## 2022-11-03 DIAGNOSIS — L820 Inflamed seborrheic keratosis: Secondary | ICD-10-CM | POA: Diagnosis not present

## 2022-11-13 ENCOUNTER — Ambulatory Visit: Payer: BC Managed Care – PPO | Admitting: Physician Assistant

## 2022-11-19 ENCOUNTER — Telehealth (HOSPITAL_COMMUNITY): Payer: Self-pay | Admitting: Psychiatry

## 2022-11-19 ENCOUNTER — Other Ambulatory Visit (HOSPITAL_COMMUNITY): Payer: Self-pay | Admitting: Psychiatry

## 2022-11-19 NOTE — Telephone Encounter (Signed)
Patient called requesting refill of: zolpidem (AMBIEN) 10 MG tablet   [facility] - [place], [place] (Ph: [phone])   Last ordered: 09/23/2022 - 30 tablets with one refill  Last visit: 09/23/2022  Next visit: 03/24/2023

## 2022-12-01 DIAGNOSIS — Z1231 Encounter for screening mammogram for malignant neoplasm of breast: Secondary | ICD-10-CM | POA: Diagnosis not present

## 2022-12-01 LAB — HM MAMMOGRAPHY

## 2022-12-08 ENCOUNTER — Encounter: Payer: Self-pay | Admitting: *Deleted

## 2022-12-22 NOTE — Progress Notes (Unsigned)
12/24/2022 Minus Breeding [id number] 1965/10/20  Referring provider: Donato Schultz, * Primary GI doctor: Dr. Lavon Paganini (Dr. Christella Hartigan)  ASSESSMENT AND PLAN:   Gastroesophageal reflux disease with right upper quadrant abdominal pain Patient's had unremarkable right upper quadrant abdominal ultrasound with fatty liver, negative HIDA, unremarkable CT abdomen and pelvis Patient's had worsening reflux, has failed Protonix/omeprazole/Carafate will start on lansoprazole, diet information given. Avoid alcohol/NSAIDs. Will schedule endoscopy with Dr. Lavon Paganini to evaluate for hiatal hernia, esophagitis, gastritis, H. pylori, peptic ulcer disease. Uncertain if right upper quadrant abdominal pain is associated with reflux versus more muscular, from history and physical I do think this could be more chest wall/muscular related.  Instructed patient to try lidocaine patches/Voltaren gel. If endoscopy is unremarkable would suggest following up with orthopedic/physical therapy.   Patient Care Team: Zola Button, Grayling Congress, DO as PCP - General Olivia Mackie, NP as Nurse Practitioner (Gynecology) Tanya Nones, OD (Optometry) Thresa Ross, MD as Consulting Physician (Psychiatry)  HISTORY OF PRESENT ILLNESS: 57 y.o. female with a past medical history of HTN, migraines, OSA, DM2, hypothyroid, and others listed below presents for evaluation of RUQ AB pain.   2010 endoscopy for noncardiac chest pain  unremarkable  09/12/2020 colonoscopy Dr. Christella Hartigan for screening purposes showed external and internal hemorrhoids otherwise unremarkable recall 10 years 07/24/2022 RUQ [place] fatty liver otherwise unremarkable 09/15/2022 HIDA 40% normal 09/26/2022 CT abdomen pelvis with contrast for right-sided abdominal pain and nausea 7 cm hypodensities on the liver, no gallstones, no biliary dilatation, normal pancreas normal spleen.  Normal stomach, no bowel wall thickening 1.3 cm intramural uterine fibroid  Patient  reports GERD in Nov/Dec associated with occ RUQ pain   She did a trial of protonix for 2-3 weeks without help, did trial of carafate that has not helped. She did trial of nexium and prilosec OTC without help.  States she is not having the GERD 24/7 like it was in the beginning but she continues to have GERD, RUQ discomfort that wraps around with occ sharp pinching pain in her right shoulder.  She denies dysphagia, melena.  She reports nausea intermittent with 2 episodes of vomiting.  She  denies AB bloating but has had increasing beltching and gas.  No unintentional weight loss, no night sweats. She has had skinny stools, occ loose stools .  Labs reviewed from 08/26/2022 showed normal kidney, normal liver Hgb 11.5, MCV 71.5, platelets 408, iron 36, ferritin 3.7, TIBC 701, durations 5.1 unremarkable urine. Patient appears to have iron deficiency anemia for the past 3 years. She use to have severe menses, she had uterine ablation several years ago and is now on BCP continuous with menses every 3 months.   She denies blood thinner use.  She reports NSAID use as needed, once a month.  She reports ETOH use, rare and social with friends, once or twice a month She denies tobacco use.  She denies drug use.    She  reports that she quit smoking about 27 years ago. Her smoking use included cigarettes. She smoked an average of 1 pack per day. She has never used smokeless tobacco. She reports current alcohol use of about 2.0 standard drinks of alcohol per week. She reports that she does not use drugs.  RELEVANT LABS AND IMAGING: CBC    Component Value Date/Time   WBC 7.8 08/26/2022 0912   RBC 5.11 08/26/2022 0912   HGB 11.5 (L) 08/26/2022 0912   HCT 36.5 08/26/2022 0912  PLT 408.0 (H) 08/26/2022 0912   MCV 71.5 (L) 08/26/2022 0912   MCV 85.4 06/22/2015 1102   MCH 25.7 (L) 11/01/2021 1606   MCHC 31.4 08/26/2022 0912   RDW 15.8 (H) 08/26/2022 0912   LYMPHSABS 1.8 08/26/2022 0912   MONOABS 0.5  08/26/2022 0912   EOSABS 0.2 08/26/2022 0912   BASOSABS 0.0 08/26/2022 0912   Recent Labs    07/22/22 1112 08/26/22 0912  HGB 11.5* 11.5*    CMP     Component Value Date/Time   NA 137 08/26/2022 0912   K 4.6 08/26/2022 0912   CL 103 08/26/2022 0912   CO2 24 08/26/2022 0912   GLUCOSE 109 (H) 08/26/2022 0912   BUN 12 08/26/2022 0912   CREATININE 0.68 08/26/2022 0912   CREATININE 0.67 11/01/2021 1606   CALCIUM 9.3 08/26/2022 0912   PROT 7.0 08/26/2022 0912   ALBUMIN 4.2 08/26/2022 0912   AST 18 08/26/2022 0912   ALT 16 08/26/2022 0912   ALKPHOS 55 08/26/2022 0912   BILITOT 0.4 08/26/2022 0912   GFRNONAA 88.71 04/18/2010 1413   GFRAA 119 09/21/2008 1356      Latest Ref Rng & Units 08/26/2022    9:12 AM 07/22/2022   11:12 AM 11/01/2021    4:06 PM  Hepatic Function  Total Protein 6.0 - 8.3 g/dL 7.0  7.3  7.1   Albumin 3.5 - 5.2 g/dL 4.2  4.4    AST 0 - 37 U/L 18  18  15    ALT 0 - 35 U/L 16  16  10    Alk Phosphatase 39 - 117 U/L 55  51    Total Bilirubin 0.2 - 1.2 mg/dL 0.4  0.3  0.3       Current Medications:   Current Outpatient Medications (Endocrine & Metabolic):    levonorgestrel-ethinyl estradiol (ALESSE) 0.1-20 MG-MCG tablet, Take 1 tablet by mouth daily. Take ACTIVE pills only, skipping PLACEBO pills   levothyroxine (SYNTHROID) 150 MCG tablet, Take 1 tablet (150 mcg total) by mouth daily before breakfast.  Current Outpatient Medications (Cardiovascular):    amLODipine (NORVASC) 5 MG tablet, Take 1 tablet (5 mg total) by mouth daily.   fenofibrate 160 MG tablet, Take 1 tablet (160 mg total) by mouth daily.   rosuvastatin (CRESTOR) 10 MG tablet, Take 1 tablet (10 mg total) by mouth daily.  Current Outpatient Medications (Respiratory):    cetirizine (ZYRTEC) 10 MG tablet, Take 1 tablet (10 mg total) by mouth daily.    Current Outpatient Medications (Other):    ALPRAZolam (XANAX) 0.25 MG tablet, Take 1 tablet (0.25 mg total) by mouth daily as needed. for  anxiety   desvenlafaxine (PRISTIQ) 100 MG 24 hr tablet, Take 1 tablet (100 mg total) by mouth daily.   lansoprazole (PREVACID) 30 MG capsule, Take 1 capsule (30 mg total) by mouth daily before breakfast.   zolpidem (AMBIEN) 10 MG tablet, TAKE 1 TABLET BY MOUTH AT BEDTIME AS NEEDED FOR SLEEP  Medical History:  Past Medical History:  Diagnosis Date   Allergy    Anemia    Anxiety    Blood transfusion without reported diagnosis    Hyperlipidemia    Hypertension    Migraine    PVC (premature ventricular contraction)    RLS (restless legs syndrome)    Thyroid disease    Allergies:  Allergies  Allergen Reactions   Morphine     Nausea & vomiting   Lisinopril     sweating     Surgical  History:  She  has a past surgical history that includes Tubal ligation (04/1999); Carpal tunnel release; Septoplasty (6/05); Cryoablation (11/04); Ectopic pregnancy surgery (1998); Bunionectomy (06/04/2009); Endometrial ablation; and Upper gastrointestinal endoscopy. Family History:  Her family history includes Alzheimer's disease in her maternal grandmother; Aneurysm in her mother; Cancer in her maternal grandfather, maternal grandmother, paternal grandfather, and paternal grandmother; Colon cancer in her paternal grandfather; Depression in her father; Diabetes in her maternal grandmother; Hypertension in her maternal grandmother and sister; Lung cancer in her maternal grandfather; Osteoporosis in her maternal grandmother; Thyroid disease in her sister; Uterine cancer in her maternal grandmother.  REVIEW OF SYSTEMS  : All other systems reviewed and negative except where noted in the History of Present Illness.  PHYSICAL EXAM: BP 128/82   Pulse 85   Ht 5\' 3"  (1.6 m)   Wt 180 lb 2 oz (81.7 kg)   BMI 31.91 kg/m  General Appearance: Well nourished, in no apparent distress. Head:   Normocephalic and atraumatic. Eyes:  sclerae anicteric,conjunctive pink  Respiratory: Respiratory effort normal, BS equal  bilaterally without rales, rhonchi, wheezing. Cardio: RRR with no MRGs. Peripheral pulses intact.  Abdomen: Soft,  Obese ,active bowel sounds. mild tenderness in the epigastrium and right chest wall/rib cage, no rash . Without guarding and Without rebound. No masses. Rectal: Not evaluated Musculoskeletal: Full ROM, Normal gait. Without edema. Skin:  Dry and intact without significant lesions or rashes Neuro: Alert and  oriented x4;  No focal deficits. Psych:  Cooperative. Normal mood and affect.    Doree Albee, PA-C 11:06 AM

## 2022-12-24 ENCOUNTER — Encounter: Payer: Self-pay | Admitting: Physician Assistant

## 2022-12-24 ENCOUNTER — Ambulatory Visit (INDEPENDENT_AMBULATORY_CARE_PROVIDER_SITE_OTHER): Payer: BC Managed Care – PPO | Admitting: Physician Assistant

## 2022-12-24 VITALS — BP 128/82 | HR 85 | Ht 63.0 in | Wt 180.1 lb

## 2022-12-24 DIAGNOSIS — K219 Gastro-esophageal reflux disease without esophagitis: Secondary | ICD-10-CM | POA: Diagnosis not present

## 2022-12-24 DIAGNOSIS — R1011 Right upper quadrant pain: Secondary | ICD-10-CM

## 2022-12-24 MED ORDER — LANSOPRAZOLE 30 MG PO CPDR: 30.0000 mg | DELAYED_RELEASE_CAPSULE | Freq: Every day | ORAL | 3 refills | Status: DC

## 2022-12-24 NOTE — Patient Instructions (Addendum)
No aleve, ibuprofen, goody powders, as these are antiinflammatories and can cause inflammation in your stomach, increase bleeding risk and cause ulcers.  You can talk with PCP about alternative pain options.  Can do tyelnol max 3000 mg a day,  voltern gel is topical antiinflammatory that is safe.    Please take this medication 30 minutes to 1 hour before meals- this makes it more effective.  Avoid spicy and acidic foods Avoid fatty foods Limit your intake of coffee, tea, alcohol, and carbonated drinks Work to maintain a healthy weight Keep the head of the bed elevated at least 3 inches with blocks or a wedge pillow if you are having any nighttime symptoms Stay upright for 2 hours after eating Avoid meals and snacks three to four hours before bedtime  You have been scheduled for an endoscopy. Please follow written instructions given to you at your visit today. If you use inhalers (even only as needed), please bring them with you on the day of your procedure.  _______________________________________________________  If your blood pressure at your visit was 140/90 or greater, please contact your primary care physician to follow up on this.  _______________________________________________________  If you are age 99 or older, your body mass index should be between 23-30. Your Body mass index is 31.91 kg/m. If this is out of the aforementioned range listed, please consider follow up with your Primary Care Provider.  If you are age 49 or younger, your body mass index should be between 19-25. Your Body mass index is 31.91 kg/m. If this is out of the aformentioned range listed, please consider follow up with your Primary Care Provider.   ________________________________________________________  The [facility] providers would like to encourage you to use [facility] to communicate with providers for non-urgent requests or questions.  Due to long hold times on the telephone, sending your provider a  message by [facility] may be a faster and more efficient way to get a response.  Please allow 48 business hours for a response.  Please remember that this is for non-urgent requests.  _______________________________________________________ It was a pleasure to see you today!  Thank you for trusting me with your gastrointestinal care!

## 2022-12-26 ENCOUNTER — Other Ambulatory Visit: Payer: Self-pay | Admitting: Family Medicine

## 2022-12-26 DIAGNOSIS — E039 Hypothyroidism, unspecified: Secondary | ICD-10-CM

## 2022-12-26 MED ORDER — LEVOTHYROXINE SODIUM 150 MCG PO TABS: 150.0000 ug | ORAL_TABLET | Freq: Every day | ORAL | 1 refills | Status: DC

## 2022-12-30 ENCOUNTER — Encounter: Payer: BC Managed Care – PPO | Admitting: Gastroenterology

## 2023-01-20 ENCOUNTER — Other Ambulatory Visit: Payer: Self-pay | Admitting: Family Medicine

## 2023-01-20 DIAGNOSIS — F32A Depression, unspecified: Secondary | ICD-10-CM

## 2023-01-23 ENCOUNTER — Other Ambulatory Visit (HOSPITAL_COMMUNITY): Payer: Self-pay | Admitting: Psychiatry

## 2023-01-23 ENCOUNTER — Other Ambulatory Visit: Payer: Self-pay | Admitting: Psychiatry

## 2023-01-23 DIAGNOSIS — F419 Anxiety disorder, unspecified: Secondary | ICD-10-CM

## 2023-02-10 ENCOUNTER — Encounter: Payer: Self-pay | Admitting: Gastroenterology

## 2023-02-10 ENCOUNTER — Ambulatory Visit (AMBULATORY_SURGERY_CENTER): Payer: BC Managed Care – PPO | Admitting: Gastroenterology

## 2023-02-10 VITALS — BP 130/83 | HR 63 | Temp 97.7°F | Resp 14 | Ht 63.0 in | Wt 180.0 lb

## 2023-02-10 DIAGNOSIS — K2970 Gastritis, unspecified, without bleeding: Secondary | ICD-10-CM

## 2023-02-10 DIAGNOSIS — K2951 Unspecified chronic gastritis with bleeding: Secondary | ICD-10-CM | POA: Diagnosis not present

## 2023-02-10 DIAGNOSIS — K449 Diaphragmatic hernia without obstruction or gangrene: Secondary | ICD-10-CM

## 2023-02-10 DIAGNOSIS — K219 Gastro-esophageal reflux disease without esophagitis: Secondary | ICD-10-CM | POA: Diagnosis not present

## 2023-02-10 MED ORDER — SODIUM CHLORIDE 0.9 % IV SOLN: 500.0000 mL | INTRAVENOUS | Status: DC

## 2023-02-10 NOTE — Progress Notes (Unsigned)
Patient reports no health or medications changes since office visit. 

## 2023-02-10 NOTE — Op Note (Signed)
[facility] Patient Name: Katie [name] Procedure Date: 02/10/2023 10:08 AM MRN: [id number] Endoscopist: Napoleon Form , MD, [id number] Age: 57 Referring MD:  Date of Birth: 11/02/1965 Gender: Female Account #: [id number] Procedure:                Upper GI endoscopy Indications:              Abdominal pain in the right upper quadrant,                            Esophageal reflux symptoms that persist despite                            appropriate therapy Medicines:                Monitored Anesthesia Care Procedure:                Pre-Anesthesia Assessment:                           - Prior to the procedure, a History and Physical                            was performed, and patient medications and                            allergies were reviewed. The patient's tolerance of                            previous anesthesia was also reviewed. The risks                            and benefits of the procedure and the sedation                            options and risks were discussed with the patient.                            All questions were answered, and informed consent                            was obtained. Prior Anticoagulants: The patient has                            taken no anticoagulant or antiplatelet agents. ASA                            Grade Assessment: II - A patient with mild systemic                            disease. After reviewing the risks and benefits,                            the patient was deemed in satisfactory condition to  undergo the procedure.                           After obtaining informed consent, the endoscope was                            passed under direct vision. Throughout the                            procedure, the patient's blood pressure, pulse, and                            oxygen saturations were monitored continuously. The                            Olympus Scope [id number] was  introduced through the                            mouth, and advanced to the second part of duodenum.                            The upper GI endoscopy was accomplished without                            difficulty. The patient tolerated the procedure                            well. Scope In: Scope Out: Findings:                 The Z-line was regular and was found 38 cm from the                            incisors.                           The gastroesophageal flap valve was visualized                            endoscopically and classified as Hill Grade III                            (minimal fold, loose to endoscope, hiatal hernia                            likely).                           A 2 cm hiatal hernia was present.                           Patchy mild inflammation characterized by                            congestion (edema) and erythema was found in the  entire examined stomach. Biopsies were taken with a                            cold forceps for Helicobacter pylori testing.                           The cardia and gastric fundus were normal on                            retroflexion.                           The first portion of the duodenum and second                            portion of the duodenum were normal. Biopsies were                            taken with a cold forceps for histology. Biopsies                            for histology were taken with a cold forceps for                            evaluation of celiac disease. Complications:            No immediate complications. Estimated Blood Loss:     Estimated blood loss was minimal. Impression:               - Z-line regular, 38 cm from the incisors.                           - Gastroesophageal flap valve classified as Hill                            Grade III (minimal fold, loose to endoscope, hiatal                            hernia likely).                           - 2  cm hiatal hernia.                           - Gastritis. Biopsied.                           - Normal first portion of the duodenum and second                            portion of the duodenum. Biopsied. Recommendation:           - Patient has a contact number available for                            emergencies. The signs and symptoms  of potential                            delayed complications were discussed with the                            patient. Return to normal activities tomorrow.                            Written discharge instructions were provided to the                            patient.                           - Resume previous diet.                           - Continue present medications.                           - Await pathology results.                           - No ibuprofen, naproxen, or other non-steroidal                            anti-inflammatory drugs.                           - Return to GI office in 3 months. Napoleon Form, MD 02/10/2023 10:22:50 AM This report has been signed electronically.

## 2023-02-10 NOTE — Patient Instructions (Addendum)
- Resume previous diet.                           - Continue present medications.                           - Await pathology results.                           - No ibuprofen, naproxen, or other non-steroidal                            anti-inflammatory drugs.                           - Return to GI office in 3 months.  YOU HAD AN ENDOSCOPIC PROCEDURE TODAY AT THE [facility]:   Refer to the procedure report that was given to you for any specific questions about what was found during the examination.  If the procedure report does not answer your questions, please call your gastroenterologist to clarify.  If you requested that your care partner not be given the details of your procedure findings, then the procedure report has been included in a sealed envelope for you to review at your convenience later.  YOU SHOULD EXPECT: Some feelings of bloating in the abdomen. Passage of more gas than usual.  Walking can help get rid of the air that was put into your GI tract during the procedure and reduce the bloating. If you had a lower endoscopy (such as a colonoscopy or flexible sigmoidoscopy) you may notice spotting of blood in your stool or on the toilet paper. If you underwent a bowel prep for your procedure, you may not have a normal bowel movement for a few days.  Please Note:  You might notice some irritation and congestion in your nose or some drainage.  This is from the oxygen used during your procedure.  There is no need for concern and it should clear up in a day or so.  SYMPTOMS TO REPORT IMMEDIATELY:   Following upper endoscopy (EGD)  Vomiting of blood or coffee ground material  New chest pain or pain under the shoulder blades  Painful or persistently difficult swallowing  New shortness of breath  Fever of 100F or higher  Black, tarry-looking stools  For urgent or emergent issues, a gastroenterologist can be reached at any hour by calling [phone]  [phone]. Do not use MyChart messaging for urgent concerns.    DIET:  We do recommend a small meal at first, but then you may proceed to your regular diet.  Drink plenty of fluids but you should avoid alcoholic beverages for 24 hours.  ACTIVITY:  You should plan to take it easy for the rest of today and you should NOT DRIVE or use heavy machinery until tomorrow (because of the sedation medicines used during the test).    FOLLOW UP: Our staff will call the number listed on your records the next business day following your procedure.  We will call around 7:15- 8:00 am to check on you and address any questions or concerns that you may have regarding the information given to you following your procedure. If we do not reach you, we will leave a message.     If any biopsies were taken  you will be contacted by phone or by letter within the next 1-3 weeks.  Please call us at [phone] if you have not heard about the biopsies in 3 weeks.    SIGNATURES/CONFIDENTIALITY: You and/or your care partner have signed paperwork which will be entered into your electronic medical record.  These signatures attest to the fact that that the information above on your After Visit Summary has been reviewed and is understood.  Full responsibility of the confidentiality of this discharge information lies with you and/or your care-partner.

## 2023-02-10 NOTE — Progress Notes (Unsigned)
[facility] History and Physical   Primary Care Physician:  Donato Schultz, DO   Reason for Procedure:  GERD, RUQ abd pain, iron deficiency anemia  Plan:    EGD with possible interventions as needed     HPI: Katie [name] is a very pleasant 57 y.o. female here for EGD for persistent GERD symptoms, iron deficiency anemia and RUQ abd pain.   The risks and benefits as well as alternatives of endoscopic procedure(s) have been discussed and reviewed. All questions answered. The patient agrees to proceed.    Past Medical History:  Diagnosis Date   Allergy    Anemia    Anxiety    Blood transfusion without reported diagnosis    Hyperlipidemia    Hypertension    Migraine    PVC (premature ventricular contraction)    RLS (restless legs syndrome)    Thyroid disease     Past Surgical History:  Procedure Laterality Date   BUNIONECTOMY  06/04/2009   Right foot   CARPAL TUNNEL RELEASE     Bilateral   CRYOABLATION  11/04   ECTOPIC PREGNANCY SURGERY  1998   ENDOMETRIAL ABLATION     SEPTOPLASTY  6/05   TUBAL LIGATION  04/1999   UPPER GASTROINTESTINAL ENDOSCOPY      Prior to Admission medications   Medication Sig Start Date End Date Taking? Authorizing Provider  ALPRAZolam (XANAX) 0.25 MG tablet Take 1 tablet (0.25 mg total) by mouth daily as needed for anxiety. for anxiety 01/23/23 02/22/23 Yes Hisada, Barbee Cough, MD  amLODipine (NORVASC) 5 MG tablet Take 1 tablet (5 mg total) by mouth daily. 07/22/22  Yes Seabron Spates R, DO  cetirizine (ZYRTEC) 10 MG tablet Take 1 tablet (10 mg total) by mouth daily. 01/17/16  Yes Seabron Spates R, DO  desvenlafaxine (PRISTIQ) 100 MG 24 hr tablet Take 1 tablet (100 mg total) by mouth daily. Needs appt 01/21/23  Yes Seabron Spates R, DO  fenofibrate 160 MG tablet Take 1 tablet (160 mg total) by mouth daily. 07/22/22  Yes Donato Schultz, DO  levonorgestrel-ethinyl estradiol (ALESSE) 0.1-20 MG-MCG tablet Take 1  tablet by mouth daily. Take ACTIVE pills only, skipping PLACEBO pills 09/25/22  Yes Wyline Beady A, NP  levothyroxine (SYNTHROID) 150 MCG tablet Take 1 tablet (150 mcg total) by mouth daily before breakfast. 12/26/22  Yes Seabron Spates R, DO  rosuvastatin (CRESTOR) 10 MG tablet Take 1 tablet (10 mg total) by mouth daily. 07/22/22  Yes Seabron Spates R, DO  zolpidem (AMBIEN) 10 MG tablet Take 1 tablet (10 mg total) by mouth at bedtime as needed for sleep. for sleep 01/24/23 02/23/23 Yes Neysa Hotter, MD  lansoprazole (PREVACID) 30 MG capsule Take 1 capsule (30 mg total) by mouth daily before breakfast. Patient not taking: Reported on 02/10/2023 12/24/22   Doree Albee, PA-C  lamoTRIgine (LAMICTAL) 25 MG tablet Take 2 tablets (50 mg total) by mouth daily. Patient not taking: Reported on 01/31/2021 01/07/21 01/31/21  Thresa Ross, MD  QUEtiapine Fumarate (SEROQUEL XR) 150 MG 24 hr tablet Take 1 tablet (150 mg total) by mouth at bedtime. Patient not taking: Reported on 01/31/2021 10/19/20 01/31/21  Donato Schultz, DO    Current Outpatient Medications  Medication Sig Dispense Refill   ALPRAZolam (XANAX) 0.25 MG tablet Take 1 tablet (0.25 mg total) by mouth daily as needed for anxiety. for anxiety 30 tablet 0   amLODipine (NORVASC) 5 MG tablet Take 1 tablet (  5 mg total) by mouth daily. 90 tablet 1   cetirizine (ZYRTEC) 10 MG tablet Take 1 tablet (10 mg total) by mouth daily.     desvenlafaxine (PRISTIQ) 100 MG 24 hr tablet Take 1 tablet (100 mg total) by mouth daily. Needs appt 90 tablet 0   fenofibrate 160 MG tablet Take 1 tablet (160 mg total) by mouth daily. 90 tablet 1   levonorgestrel-ethinyl estradiol (ALESSE) 0.1-20 MG-MCG tablet Take 1 tablet by mouth daily. Take ACTIVE pills only, skipping PLACEBO pills 84 tablet 2   levothyroxine (SYNTHROID) 150 MCG tablet Take 1 tablet (150 mcg total) by mouth daily before breakfast. 90 tablet 1   rosuvastatin (CRESTOR) 10 MG tablet Take 1 tablet  (10 mg total) by mouth daily. 90 tablet 3   zolpidem (AMBIEN) 10 MG tablet Take 1 tablet (10 mg total) by mouth at bedtime as needed for sleep. for sleep 30 tablet 0   lansoprazole (PREVACID) 30 MG capsule Take 1 capsule (30 mg total) by mouth daily before breakfast. (Patient not taking: Reported on 02/10/2023) 30 capsule 3   Current Facility-Administered Medications  Medication Dose Route Frequency Provider Last Rate Last Admin   0.9 %  sodium chloride infusion  500 mL Intravenous Continuous Lorynn Moeser, Eleonore Chiquito, MD        Allergies as of 02/10/2023 - Review Complete 02/10/2023  Allergen Reaction Noted   Morphine  06/20/2008   Lisinopril  03/10/2019    Family History  Problem Relation Age of Onset   Depression Father        Suicide   Aneurysm Mother        Brain   Hypertension Sister    Thyroid disease Sister    Diabetes Maternal Grandmother    Alzheimer's disease Maternal Grandmother    Uterine cancer Maternal Grandmother    Hypertension Maternal Grandmother    Osteoporosis Maternal Grandmother    Cancer Maternal Grandmother        uterine   Lung cancer Maternal Grandfather    Cancer Maternal Grandfather        lung   Cancer Paternal Grandmother        metatstatic-- ? 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[employer]  Tobacco Use   Smoking status: Former    Packs/day: 1    Types: Cigarettes    Quit date: 01/14/1995    Years since quitting: 28.0   Smokeless tobacco: Never   Tobacco comments:    started smoking at age 77 1ppd  Vaping Use   Vaping  Use: Never used  Substance and Sexual Activity   Alcohol use: Yes    Alcohol/week: 2.0 standard drinks of alcohol    Types: 2 Cans of beer per week    Comment: Reports a couple of beers 1- 2 times a month   Drug use: No   Sexual activity: Not Currently    Partners: Male  Comment: 1st intercourse 57 yo-More than 5 partners-BTL  Other Topics Concern   Not on file  Social History Narrative   Exercise-- no   Social Determinants of Health   Financial Resource Strain: Not on file  Food Insecurity: Not on file  Transportation Needs: Not on file  Physical Activity: Not on file  Stress: Not on file  Social Connections: Not on file  Intimate Partner Violence: Not on file    Review of Systems:  All other review of systems negative except as mentioned in the HPI.  Physical Exam: Vital signs in last 24 hours: Blood Pressure (Abnormal) 144/92   Pulse 74   Temperature 97.7 F (36.5 C)   Height 5\' 3"  (1.6 m)   Weight 180 lb (81.6 kg)   Oxygen Saturation 99%   Body Mass Index 31.89 kg/m  General:   Alert, NAD Lungs:  Clear .   Heart:  Regular rate and rhythm Abdomen:  Soft, nontender and nondistended. Neuro/Psych:  Alert and cooperative. Normal mood and affect. A and O x 3  Reviewed labs, radiology imaging, old records and pertinent past GI work up  Patient is appropriate for planned procedure(s) and anesthesia in an ambulatory setting   K. Scherry Ran , MD [phone]

## 2023-02-10 NOTE — Progress Notes (Unsigned)
Called to room to assist during endoscopic procedure.  Patient ID and intended procedure confirmed with present staff. Received instructions for my participation in the procedure from the performing physician.  

## 2023-02-10 NOTE — Progress Notes (Unsigned)
Report to PACU, RN, vss, BBS= Clear.  

## 2023-02-11 ENCOUNTER — Telehealth: Payer: Self-pay | Admitting: *Deleted

## 2023-02-11 NOTE — Telephone Encounter (Signed)
  Follow up Call-     02/10/2023    9:21 AM 09/12/2020    9:37 AM  Call back number  Post procedure Call Back phone  # [phone]  Permission to leave phone message Yes Yes     Patient questions:  Do you have a fever, pain , or abdominal swelling? No. Pain Score  0 *  Have you tolerated food without any problems? Yes.    Have you been able to return to your normal activities? Yes.    Do you have any questions about your discharge instructions: Diet   No. Medications  No. Follow up visit  No.  Do you have questions or concerns about your Care? No.  Actions: * If pain score is 4 or above: No action needed, pain <4.

## 2023-02-20 ENCOUNTER — Telehealth (HOSPITAL_COMMUNITY): Payer: Self-pay | Admitting: *Deleted

## 2023-02-20 ENCOUNTER — Other Ambulatory Visit (HOSPITAL_COMMUNITY): Payer: Self-pay | Admitting: Psychiatry

## 2023-02-20 NOTE — Telephone Encounter (Signed)
[facility] - [place], [place] - [address]    zolpidem (AMBIEN) 10 MG tablet [[id number]]   Order Details Dose: 10 mg Route: Oral Frequency: At bedtime PRN for sleep  Dispense Quantity: 30 tablet      LAST APPT-- 09/23/22 NEXT APPT-- 03/24/23

## 2023-03-02 ENCOUNTER — Encounter: Payer: Self-pay | Admitting: Gastroenterology

## 2023-03-10 ENCOUNTER — Other Ambulatory Visit (HOSPITAL_COMMUNITY): Payer: Self-pay | Admitting: Psychiatry

## 2023-03-10 DIAGNOSIS — F419 Anxiety disorder, unspecified: Secondary | ICD-10-CM

## 2023-03-12 ENCOUNTER — Telehealth (HOSPITAL_COMMUNITY): Payer: Self-pay | Admitting: *Deleted

## 2023-03-12 MED ORDER — ALPRAZOLAM 0.25 MG PO TABS: 0.2500 mg | ORAL_TABLET | Freq: Every day | ORAL | 0 refills | Status: DC | PRN

## 2023-03-12 NOTE — Telephone Encounter (Signed)
Patient Request-- [facility] doesn't have Refill Sent  ALPRAZolam (XANAX) 0.25 MG tablet Take 1 tablet (0.25 mg total) by mouth daily as needed. for anxiety 30 tablet   Last Appt  1//30/24 Next Appt  02/3023  [facility] - [place], [place] - [address]

## 2023-03-12 NOTE — Addendum Note (Signed)
Addended by: Thresa Ross on: 03/12/2023 08:26 AM   Modules accepted: Orders

## 2023-03-24 ENCOUNTER — Encounter (HOSPITAL_COMMUNITY): Payer: Self-pay | Admitting: Psychiatry

## 2023-03-24 ENCOUNTER — Ambulatory Visit (HOSPITAL_COMMUNITY): Payer: BC Managed Care – PPO | Admitting: Psychiatry

## 2023-03-24 VITALS — BP 136/86 | HR 86 | Temp 99.3°F | Ht 62.5 in | Wt 174.0 lb

## 2023-03-24 DIAGNOSIS — F5102 Adjustment insomnia: Secondary | ICD-10-CM

## 2023-03-24 DIAGNOSIS — F419 Anxiety disorder, unspecified: Secondary | ICD-10-CM | POA: Diagnosis not present

## 2023-03-24 DIAGNOSIS — F0630 Mood disorder due to known physiological condition, unspecified: Secondary | ICD-10-CM

## 2023-03-24 DIAGNOSIS — F411 Generalized anxiety disorder: Secondary | ICD-10-CM | POA: Diagnosis not present

## 2023-03-24 MED ORDER — ALPRAZOLAM 0.25 MG PO TABS: 0.2500 mg | ORAL_TABLET | Freq: Every day | ORAL | 2 refills | Status: DC | PRN

## 2023-03-24 MED ORDER — ZOLPIDEM TARTRATE 10 MG PO TABS: 10.0000 mg | ORAL_TABLET | Freq: Every evening | ORAL | 2 refills | Status: DC | PRN

## 2023-03-24 NOTE — Progress Notes (Signed)
[facility] Follow up visit  Patient Identification: Katie [name] MRN:  [id number] Date of Evaluation:  03/24/2023 Referral Source: primary care Chief Complaint:  follow up insomnia    Visit Diagnosis:    ICD-10-CM   1. Mood disorder in conditions classified elsewhere  F06.30     2. GAD (generalized anxiety disorder)  F41.1     3. Adjustment insomnia  F51.02     4. 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 2 grown boys currently she is living with her husband  Patient doing fair, insomnia fair on [place] Xanax helps with anxiety and panic attacks, does not take it during the weekend     Depression stable  Stress varies but manageable with pristiq, xanax   Aggravating factors;job can be,   distant relationship with son Modifying factors ; son  Duration since young age Severity manageable  She was seen at age 16 by her counselor after her father's death 1 time no medication given       Past Psychiatric History: insomnia, anxiety  Previous Psychotropic Medications: Yes  Zoloft, seroquel   Past Medical History:  Past Medical History:  Diagnosis Date   Allergy    Anemia    Anxiety    Blood transfusion without reported diagnosis    Hyperlipidemia    Hypertension    Migraine    PVC (premature ventricular contraction)    RLS (restless legs syndrome)    Thyroid disease     Past Surgical History:  Procedure Laterality Date   BUNIONECTOMY  06/04/2009   Right foot   CARPAL TUNNEL RELEASE     Bilateral   CRYOABLATION  11/04   ECTOPIC PREGNANCY SURGERY  1998   ENDOMETRIAL ABLATION     SEPTOPLASTY  6/05   TUBAL LIGATION  04/1999   UPPER GASTROINTESTINAL ENDOSCOPY      Family Psychiatric History: Dad: committed suicide, see chart , Niece;  depression  Family History:  Family History  Problem Relation Age of Onset   Depression Father        Suicide   Aneurysm Mother        Brain   Hypertension Sister    Thyroid disease Sister    Diabetes Maternal Grandmother    Alzheimer's disease Maternal Grandmother    Uterine cancer Maternal Grandmother    Hypertension Maternal Grandmother    Osteoporosis Maternal Grandmother    Cancer Maternal Grandmother        uterine   Lung cancer Maternal Grandfather    Cancer Maternal Grandfather        lung   Cancer Paternal Grandmother        metatstatic-- ? primary colon   Cancer Paternal Grandfather        skin, hepatic   Colon cancer Paternal Grandfather        age unknown   Colon polyps Neg Hx    Rectal cancer Neg Hx    Stomach cancer Neg Hx    Esophageal cancer Neg Hx     Social History:   Social History   Socioeconomic History   Marital status: Married    Spouse name: Not on file   Number of children: 2   Years of education: 15   Highest education level: Not on  file  Occupational History   Occupation: scheduler [employer]  Tobacco Use   Smoking status: Former    Current packs/day: 0.00    Types: Cigarettes    Quit date: 01/14/1995    Years since quitting: 28.2   Smokeless tobacco: Never   Tobacco comments:    started smoking at age 28 1ppd  Vaping Use   Vaping status: Never Used  Substance and Sexual Activity   Alcohol use: Not Currently    Alcohol/week: 2.0 standard drinks of alcohol    Types: 2 Cans of beer per week    Comment: Reports a couple of beers 1- 2 times a month   Drug use: No   Sexual activity: Not Currently    Partners: Male    Comment: 1st intercourse 57 yo-More than 5 partners-BTL  Other Topics Concern   Not on file  Social History Narrative   Exercise-- no   Social Determinants of Health   Financial Resource Strain: Not on file  Food Insecurity: Not on file  Transportation Needs: Not on file  Physical Activity: Not on file   Stress: Not on file  Social Connections: Not on file     Allergies:   Allergies  Allergen Reactions   Morphine     Nausea & vomiting   Lisinopril     sweating    Metabolic Disorder Labs: Lab Results  Component Value Date   HGBA1C 6.8 (H) 07/22/2022   No results found for: "PROLACTIN" Lab Results  Component Value Date   CHOL 148 07/22/2022   TRIG 143.0 07/22/2022   HDL 51.40 07/22/2022   CHOLHDL 3 07/22/2022   VLDL 28.6 07/22/2022   LDLCALC 68 07/22/2022   LDLCALC 75 11/01/2021   Lab Results  Component Value Date   TSH 0.70 07/22/2022    Therapeutic Level Labs: No results found for: "LITHIUM" No results found for: "CBMZ" No results found for: "VALPROATE"  Current Medications: Current Outpatient Medications  Medication Sig Dispense Refill   amLODipine (NORVASC) 5 MG tablet Take 1 tablet (5 mg total) by mouth daily. 90 tablet 1   cetirizine (ZYRTEC) 10 MG tablet Take 1 tablet (10 mg total) by mouth daily.     desvenlafaxine (PRISTIQ) 100 MG 24 hr tablet Take 1 tablet (100 mg total) by mouth daily. Needs appt 90 tablet 0   fenofibrate 160 MG tablet Take 1 tablet (160 mg total) by mouth daily. 90 tablet 1   lansoprazole (PREVACID) 30 MG capsule Take 1 capsule (30 mg total) by mouth daily before breakfast. 30 capsule 3   levonorgestrel-ethinyl estradiol (ALESSE) 0.1-20 MG-MCG tablet Take 1 tablet by mouth daily. Take ACTIVE pills only, skipping PLACEBO pills 84 tablet 2   levothyroxine (SYNTHROID) 150 MCG tablet Take 1 tablet (150 mcg total) by mouth daily before breakfast. 90 tablet 1   rosuvastatin (CRESTOR) 10 MG tablet Take 1 tablet (10 mg total) by mouth daily. 90 tablet 3   ALPRAZolam (XANAX) 0.25 MG tablet Take 1 tablet (0.25 mg total) by mouth daily as needed for anxiety. 30 tablet 2   zolpidem (AMBIEN) 10 MG tablet Take 1 tablet (10 mg total) by mouth at bedtime as needed. for sleep 30 tablet 2   No current facility-administered medications for this visit.       Psychiatric Specialty Exam: Review of Systems  Cardiovascular:  Negative for chest pain and palpitations.  Neurological:  Negative for tremors.  Psychiatric/Behavioral:  Negative for self-injury and suicidal ideas.  Blood pressure 136/86, pulse 86, temperature 99.3 F (37.4 C), height 5' 2.5" (1.588 m), weight 174 lb (78.9 kg), SpO2 99%.Body mass index is 31.32 kg/m.  General Appearance: Casual  Eye Contact:  Fair  Speech:  Clear and Coherent  Volume:  Normal  Mood:  fair   Affect:  Full Range  Thought Process:  Goal Directed  Orientation:  Full (Time, Place, and Person)  Thought Content:  Rumination  Suicidal Thoughts:  No  Homicidal Thoughts:  No  Memory:  Immediate;   Fair Recent;   Fair  Judgement:  Fair  Insight:  Fair  Psychomotor Activity:  Normal  Concentration:  Concentration: Fair and Attention Span: Fair  Recall:  [employer] of Knowledge:Good  Language: Good  Akathisia:  No  Handed:    AIMS (if indicated):  not done  Assets:  Desire for Improvement Financial Resources/Insurance Physical Health Social Support  ADL's:  Intact  Cognition: WNL  Sleep:  Poor   Screenings: PHQ2-9    Flowsheet Row Office Visit from 01/09/2022 in [facility] Primary Care at [facility] Office Visit from 12/13/2020 in [facility] at [facility] Office Visit from 10/19/2020 in [facility] Care at [facility] Office Visit from 03/19/2017 in [facility] Primary Care at [facility] Office Visit from 06/22/2015 in Primary Care at [facility] Total Score 0 0 0 0 0      Flowsheet Row Office Visit from 05/22/2022 in [facility] at [facility] Office Visit from 01/07/2022 in [facility] at [facility] Office Visit from 09/12/2021 in [facility] at [facility]  C-SSRS  RISK CATEGORY No Risk No Risk No Risk       Assessment and Plan: as follows Prior documentation reviewed  Mood disorder not otherwise specified; fair on pristiq   Insomnia; manageable with ambien, , xanax    Generalized anxiety disorder; doing fair continjue pristiq, xanax   Side effects reviewed , Direct care time spent in office: including face to face, chart review, documentation 15 - 20 min   Fu 10m.  Thresa Ross, MD 7/30/20249:58 AM

## 2023-04-20 ENCOUNTER — Other Ambulatory Visit: Payer: Self-pay | Admitting: Physician Assistant

## 2023-04-20 ENCOUNTER — Other Ambulatory Visit: Payer: Self-pay | Admitting: Family Medicine

## 2023-04-20 DIAGNOSIS — F32A Depression, unspecified: Secondary | ICD-10-CM

## 2023-04-21 ENCOUNTER — Ambulatory Visit: Payer: BC Managed Care – PPO | Admitting: Family Medicine

## 2023-04-22 ENCOUNTER — Other Ambulatory Visit: Payer: Self-pay | Admitting: Family Medicine

## 2023-04-22 DIAGNOSIS — I10 Essential (primary) hypertension: Secondary | ICD-10-CM

## 2023-04-30 ENCOUNTER — Ambulatory Visit: Payer: BC Managed Care – PPO | Admitting: Family Medicine

## 2023-04-30 VITALS — BP 136/88 | HR 70 | Temp 98.4°F | Resp 18 | Ht 62.5 in | Wt 176.6 lb

## 2023-04-30 DIAGNOSIS — E039 Hypothyroidism, unspecified: Secondary | ICD-10-CM | POA: Diagnosis not present

## 2023-04-30 DIAGNOSIS — G43109 Migraine with aura, not intractable, without status migrainosus: Secondary | ICD-10-CM

## 2023-04-30 DIAGNOSIS — E1165 Type 2 diabetes mellitus with hyperglycemia: Secondary | ICD-10-CM

## 2023-04-30 DIAGNOSIS — I10 Essential (primary) hypertension: Secondary | ICD-10-CM | POA: Diagnosis not present

## 2023-04-30 DIAGNOSIS — E785 Hyperlipidemia, unspecified: Secondary | ICD-10-CM | POA: Diagnosis not present

## 2023-04-30 DIAGNOSIS — E538 Deficiency of other specified B group vitamins: Secondary | ICD-10-CM

## 2023-04-30 NOTE — Patient Instructions (Signed)

## 2023-04-30 NOTE — Progress Notes (Signed)
Established Patient Office Visit  Subjective   Patient ID: Katie [name], female    DOB: 1966-02-26  Age: 57 y.o. MRN: [id number]  Chief Complaint  Patient presents with   Hypothyroidism   Hyperlipidemia   Follow-up    HPI Discussed the use of AI scribe software for clinical note transcription with the patient, who gave verbal consent to proceed.  History of Present Illness   The patient, with a history of ocular migraines, presents for a follow-up visit. She reports experiencing double vision on a few occasions earlier this year, and again two days prior to the current visit. The patient describes her eyes as feeling "weird" during these episodes. She also reports seeing "squiggly lines" occasionally, a symptom she associates with her ocular migraines. However, she has not experienced a migraine headache during these episodes.  Recently, the patient has also been experiencing a new visual symptom, described as seeing a "shooting star" or flash of light. This occurs daily and is a recent development since her last eye doctor visit. The patient's eye doctor did not find any abnormalities during her last visit, but she is scheduled for a follow-up visit next year.  The patient also reports increased stress due to the recent suicide of her youngest son. She has been experiencing borderline high blood pressure, which she wonders may be related to her visual symptoms.      Patient Active Problem List   Diagnosis Date Noted   RUQ abdominal pain 09/05/2022   Midepigastric pain 09/05/2022   Jaw pain 01/09/2022   Right hip pain 04/05/2021   History of endometrial ablation 05/16/2020   Allergy    Close exposure to COVID-19 virus 02/10/2019   HTN (hypertension) 02/10/2019   Hashimoto's thyroiditis 10/26/2018   Need for shingles vaccine 10/13/2018   Hyperlipidemia LDL goal <100 10/01/2017   Type 2 diabetes mellitus with hyperglycemia, without long-term current use of insulin (HCC) 10/01/2017    Preventative health care 03/21/2017   Hyperglycemia 03/21/2017   Menstrual migraine without status migrainosus, not intractable 04/17/2015   Migraine 04/10/2015   Viral syndrome 04/20/2014   Obesity (BMI 30-39.9) 03/14/2013   Sinusitis 08/24/2011   RLS (restless legs syndrome) 12/13/2009   OBSTRUCTIVE SLEEP APNEA 12/12/2009   Anxiety 02/01/2009   Hyperlipidemia 01/19/2009   HYPOKALEMIA, MILD 01/19/2009   DYSPEPSIA 09/28/2008   Chest pain 07/19/2008   Insomnia 07/03/2008   PLANTAR FASCIITIS, BILATERAL 06/20/2008   Hypothyroidism 02/22/2007   DEVIATED NASAL SEPTUM 02/22/2007   PREGNANCY, ECTOPIC NEC W/INTRAUTERINE PRG 02/22/2007   HEADACHE 02/22/2007   Other postprocedural status(V45.89) 02/22/2007   Past Medical History:  Diagnosis Date   Allergy    Anemia    Anxiety    Blood transfusion without reported diagnosis    Hyperlipidemia    Hypertension    Migraine    PVC (premature ventricular contraction)    RLS (restless legs syndrome)    Thyroid disease    Past Surgical History:  Procedure Laterality Date   BUNIONECTOMY  06/04/2009   Right foot   CARPAL TUNNEL RELEASE     Bilateral   CRYOABLATION  11/04   ECTOPIC PREGNANCY SURGERY  1998   ENDOMETRIAL ABLATION     SEPTOPLASTY  6/05   TUBAL LIGATION  04/1999   UPPER GASTROINTESTINAL ENDOSCOPY     Social History   Tobacco Use   Smoking status: Former    Current packs/day: 0.00    Types: Cigarettes    Quit date: 01/14/1995  Years since quitting: 28.3   Smokeless tobacco: Never   Tobacco comments:    started smoking at age 6 1ppd  Vaping Use   Vaping status: Never Used  Substance Use Topics   Alcohol use: Not Currently    Alcohol/week: 2.0 standard drinks of alcohol    Types: 2 Cans of beer per week    Comment: Reports a couple of beers 1- 2 times a month   Drug use: No   Social History   Socioeconomic History   Marital status: Married    Spouse name: Not on file   Number of children: 2   Years of  education: 15   Highest education level: Some college, no degree  Occupational History   Occupation: Systems developer [employer]  Tobacco Use   Smoking status: Former    Current packs/day: 0.00    Types: Cigarettes    Quit date: 01/14/1995    Years since quitting: 28.3   Smokeless tobacco: Never   Tobacco comments:    started smoking at age 36 1ppd  Vaping Use   Vaping status: Never Used  Substance and Sexual Activity   Alcohol use: Not Currently    Alcohol/week: 2.0 standard drinks of alcohol    Types: 2 Cans of beer per week    Comment: Reports a couple of beers 1- 2 times a month   Drug use: No   Sexual activity: Not Currently    Partners: Male    Comment: 1st intercourse 57 yo-More than 5 partners-BTL  Other Topics Concern   Not on file  Social History Narrative   Exercise-- no   Social Determinants of Health   Financial Resource Strain: Patient Declined (04/30/2023)   Overall Financial Resource Strain (CARDIA)    Difficulty of Paying Living Expenses: Patient declined  Food Insecurity: No Food Insecurity (04/30/2023)   Hunger Vital Sign    Worried About Running Out of Food in the Last Year: Never true    Ran Out of Food in the Last Year: Never true  Transportation Needs: No Transportation Needs (04/30/2023)   PRAPARE - Administrator, Civil Service (Medical): No    Lack of Transportation (Non-Medical): No  Physical Activity: Unknown (04/30/2023)   Exercise Vital Sign    Days of Exercise per Week: Patient declined    Minutes of Exercise per Session: Not on file  Stress: Patient Declined (04/30/2023)   [employer] of Occupational Health - Occupational Stress Questionnaire    Feeling of Stress : Patient declined  Social Connections: Unknown (04/30/2023)   Social Connection and Isolation Panel [NHANES]    Frequency of Communication with Friends and Family: Patient declined    Frequency of Social Gatherings with Friends and Family: Patient declined    Attends  Religious Services: Never    Database administrator or Organizations: No    Attends Engineer, structural: Not on file    Marital Status: Married  Catering manager Violence: Not on file   Family Status  Relation Name Status   Father  Deceased   Mother  Deceased   Sister  Alive   Sister  Alive   MGM  (Not Specified)   MGF  (Not Specified)   PGM  (Not Specified)   PGF  (Not Specified)   Neg Hx  (Not Specified)  No partnership data on file   Family History  Problem Relation Age of Onset   Depression Father        Suicide  Aneurysm Mother        Brain   Hypertension Sister    Thyroid disease Sister    Diabetes Maternal Grandmother    Alzheimer's disease Maternal Grandmother    Uterine cancer Maternal Grandmother    Hypertension Maternal Grandmother    Osteoporosis Maternal Grandmother    Cancer Maternal Grandmother        uterine   Lung cancer Maternal Grandfather    Cancer Maternal Grandfather        lung   Cancer Paternal Grandmother        metatstatic-- ? primary colon   Cancer Paternal Grandfather        skin, hepatic   Colon cancer Paternal Grandfather        age unknown   Colon polyps Neg Hx    Rectal cancer Neg Hx    Stomach cancer Neg Hx    Esophageal cancer Neg Hx    Allergies  Allergen Reactions   Morphine     Nausea & vomiting   Lisinopril     sweating      Review of Systems  Constitutional:  Negative for fever and malaise/fatigue.  HENT:  Negative for congestion.   Eyes:  Negative for blurred vision.  Respiratory:  Negative for shortness of breath.   Cardiovascular:  Negative for chest pain, palpitations and leg swelling.  Gastrointestinal:  Negative for abdominal pain, blood in stool and nausea.  Genitourinary:  Negative for dysuria and frequency.  Musculoskeletal:  Negative for falls.  Skin:  Negative for rash.  Neurological:  Negative for dizziness, loss of consciousness and headaches.  Endo/Heme/Allergies:  Negative for  environmental allergies.  Psychiatric/Behavioral:  Positive for depression. Negative for hallucinations, substance abuse and suicidal ideas. The patient is not nervous/anxious.       Objective:     BP 136/88 (BP Location: Left Arm, Patient Position: Sitting, Cuff Size: Normal)   Pulse 70   Temp 98.4 F (36.9 C) (Oral)   Resp 18   Ht 5' 2.5" (1.588 m)   Wt 176 lb 9.6 oz (80.1 kg)   SpO2 98%   BMI 31.79 kg/m  BP Readings from Last 3 Encounters:  04/30/23 136/88  02/10/23 130/83  12/24/22 128/82   Wt Readings from Last 3 Encounters:  04/30/23 176 lb 9.6 oz (80.1 kg)  02/10/23 180 lb (81.6 kg)  12/24/22 180 lb 2 oz (81.7 kg)   SpO2 Readings from Last 3 Encounters:  04/30/23 98%  02/10/23 99%  09/05/22 97%      Physical Exam Vitals and nursing note reviewed.  Constitutional:      General: She is not in acute distress.    Appearance: Normal appearance. She is well-developed.  HENT:     Head: Normocephalic and atraumatic.  Eyes:     General: No scleral icterus.       Right eye: No discharge.        Left eye: No discharge.  Cardiovascular:     Rate and Rhythm: Normal rate and regular rhythm.     Heart sounds: No murmur heard. Pulmonary:     Effort: Pulmonary effort is normal. No respiratory distress.     Breath sounds: Normal breath sounds.  Musculoskeletal:        General: Normal range of motion.     Cervical back: Normal range of motion and neck supple.     Right lower leg: No edema.     Left lower leg: No edema.  Skin:  General: Skin is warm and dry.  Neurological:     General: No focal deficit present.     Mental Status: She is alert and oriented to person, place, and time.  Psychiatric:        Mood and Affect: Mood normal.        Behavior: Behavior normal.        Thought Content: Thought content normal.        Judgment: Judgment normal.      No results found for any visits on 04/30/23.  Last CBC Lab Results  Component Value Date   WBC 7.8  08/26/2022   HGB 11.5 (L) 08/26/2022   HCT 36.5 08/26/2022   MCV 71.5 (L) 08/26/2022   MCH 25.7 (L) 11/01/2021   RDW 15.8 (H) 08/26/2022   PLT 408.0 (H) 08/26/2022   Last metabolic panel Lab Results  Component Value Date   GLUCOSE 109 (H) 08/26/2022   NA 137 08/26/2022   K 4.6 08/26/2022   CL 103 08/26/2022   CO2 24 08/26/2022   BUN 12 08/26/2022   CREATININE 0.68 08/26/2022   GFR 98.79 08/26/2022   CALCIUM 9.3 08/26/2022   PROT 7.0 08/26/2022   ALBUMIN 4.2 08/26/2022   BILITOT 0.4 08/26/2022   ALKPHOS 55 08/26/2022   AST 18 08/26/2022   ALT 16 08/26/2022   Last lipids Lab Results  Component Value Date   CHOL 148 07/22/2022   HDL 51.40 07/22/2022   LDLCALC 68 07/22/2022   LDLDIRECT 62.0 04/09/2020   TRIG 143.0 07/22/2022   CHOLHDL 3 07/22/2022   Last hemoglobin A1c Lab Results  Component Value Date   HGBA1C 6.8 (H) 07/22/2022   Last thyroid functions Lab Results  Component Value Date   TSH 0.70 07/22/2022   T4TOTAL 11.5 04/05/2021   Last vitamin D No results found for: "25OHVITD2", "25OHVITD3", "VD25OH" Last vitamin B12 and Folate Lab Results  Component Value Date   VITAMINB12 250 08/26/2022      The 10-year ASCVD risk score (Arnett DK, et al., 2019) is: 4.5%    Assessment & Plan:   Problem List Items Addressed This Visit       Unprioritized   Hyperlipidemia LDL goal <100   Relevant Orders   Lipid panel   CBC with Differential/Platelet   Comprehensive metabolic panel   HTN (hypertension) - Primary   Relevant Orders   Microalbumin / creatinine urine ratio   Type 2 diabetes mellitus with hyperglycemia, without long-term current use of insulin (HCC)   Hypothyroidism    Check labs Con't synthroid       Relevant Orders   TSH   Hyperlipidemia    Encourage heart healthy diet such as MIND or DASH diet, increase exercise, avoid trans fats, simple carbohydrates and processed foods, consider a krill or fish or flaxseed oil cap daily.         Other Visit Diagnoses     B12 deficiency       Relevant Orders   Vitamin B12   Essential hypertension       Relevant Orders   Lipid panel   CBC with Differential/Platelet   Comprehensive metabolic panel   Uncontrolled type 2 diabetes mellitus with hyperglycemia (HCC)       Relevant Orders   Hemoglobin A1c   Microalbumin / creatinine urine ratio   Ocular migraine       Relevant Orders   MR Brain Wo Contrast     Assessment and Plan    Visual disturbances  Recent onset of daily "shooting star" visual phenomena and recurrent episodes of double vision. History of ocular migraines with scintillating scotoma. Recent ophthalmologic evaluation with no significant findings. -Order MRI of the brain to rule out central nervous system pathology. -Advise patient to contact ophthalmologist for re-evaluation given new symptoms. -If symptoms worsen or significantly affect vision, advise patient to seek emergency care.  Hypertension Borderline blood pressure readings. Currently on Norvasc 5mg  daily. -Advise patient to monitor blood pressure at home a few times per week. -Encourage patient to follow DASH diet and limit salt intake. -Recheck blood pressure in 2-3 weeks. Consider adding a diuretic if blood pressure remains elevated.  Grief and stress Recent loss of son to suicide. Patient reports having "okay days and not so okay days." -Encourage patient to continue open communication with family and support network. -Advise patient to seek professional mental health support if needed.  General Health Maintenance -Order routine blood work. -Advise patient to consider flu shot.        Return in about 3 weeks (around 05/21/2023), or if symptoms worsen or fail to improve.    Donato Schultz, DO

## 2023-04-30 NOTE — Assessment & Plan Note (Signed)
Encourage heart healthy diet such as MIND or DASH diet, increase exercise, avoid trans fats, simple carbohydrates and processed foods, consider a krill or fish or flaxseed oil cap daily.  °

## 2023-04-30 NOTE — Assessment & Plan Note (Signed)
Check labs  Con't synthroid 

## 2023-05-01 LAB — COMPREHENSIVE METABOLIC PANEL
ALT: 12 U/L (ref 0–35)
AST: 15 U/L (ref 0–37)
Albumin: 4.1 g/dL (ref 3.5–5.2)
Alkaline Phosphatase: 57 U/L (ref 39–117)
BUN: 11 mg/dL (ref 6–23)
CO2: 27 meq/L (ref 19–32)
Calcium: 9.5 mg/dL (ref 8.4–10.5)
Chloride: 101 meq/L (ref 96–112)
Creatinine, Ser: 0.83 mg/dL (ref 0.40–1.20)
GFR: 79.59 mL/min (ref >60.00)
Glucose, Bld: 86 mg/dL (ref 70–99)
Potassium: 4.7 meq/L (ref 3.5–5.1)
Sodium: 136 meq/L (ref 135–145)
Total Bilirubin: 0.3 mg/dL (ref 0.2–1.2)
Total Protein: 7.6 g/dL (ref 6.0–8.3)

## 2023-05-01 LAB — LIPID PANEL
Cholesterol: 135 mg/dL (ref 0–200)
HDL: 44.1 mg/dL (ref >39.00)
LDL Cholesterol: 66 mg/dL (ref 0–99)
NonHDL: 91.23
Total CHOL/HDL Ratio: 3
Triglycerides: 126 mg/dL (ref 0.0–149.0)
VLDL: 25.2 mg/dL (ref 0.0–40.0)

## 2023-05-01 LAB — CBC WITH DIFFERENTIAL/PLATELET
Basophils Absolute: 0 10*3/uL (ref 0.0–0.1)
Basophils Relative: 0.9 % (ref 0.0–3.0)
Eosinophils Absolute: 0.2 10*3/uL (ref 0.0–0.7)
Eosinophils Relative: 3.5 % (ref 0.0–5.0)
HCT: 36.7 % (ref 36.0–46.0)
Hemoglobin: 11.6 g/dL — ABNORMAL LOW (ref 12.0–15.0)
Lymphocytes Relative: 45.3 % (ref 12.0–46.0)
Lymphs Abs: 2.4 10*3/uL (ref 0.7–4.0)
MCHC: 31.5 g/dL (ref 30.0–36.0)
MCV: 71.7 fl — ABNORMAL LOW (ref 78.0–100.0)
Monocytes Absolute: 0.4 10*3/uL (ref 0.1–1.0)
Monocytes Relative: 7.8 % (ref 3.0–12.0)
Neutro Abs: 2.3 10*3/uL (ref 1.4–7.7)
Neutrophils Relative %: 42.5 % — ABNORMAL LOW (ref 43.0–77.0)
Platelets: 476 10*3/uL — ABNORMAL HIGH (ref 150.0–400.0)
RBC: 5.12 Mil/uL — ABNORMAL HIGH (ref 3.87–5.11)
RDW: 16.3 % — ABNORMAL HIGH (ref 11.5–15.5)
WBC: 5.3 10*3/uL (ref 4.0–10.5)

## 2023-05-01 LAB — VITAMIN B12: Vitamin B-12: 76 pg/mL — ABNORMAL LOW (ref 211–911)

## 2023-05-01 LAB — MICROALBUMIN / CREATININE URINE RATIO
Creatinine,U: 115.1 mg/dL
Microalb Creat Ratio: 1.5 mg/g (ref 0.0–30.0)
Microalb, Ur: 1.8 mg/dL (ref 0.0–1.9)

## 2023-05-01 LAB — TSH: TSH: 2.33 u[IU]/mL (ref 0.35–5.50)

## 2023-05-01 LAB — HEMOGLOBIN A1C: Hgb A1c MFr Bld: 6.9 % — ABNORMAL HIGH (ref 4.6–6.5)

## 2023-05-13 ENCOUNTER — Ambulatory Visit: Payer: BC Managed Care – PPO | Admitting: Physician Assistant

## 2023-05-20 ENCOUNTER — Ambulatory Visit
Admission: RE | Admit: 2023-05-20 | Discharge: 2023-05-20 | Disposition: A | Discharge status: Home / Self Care | Payer: BC Managed Care – PPO | Source: Ambulatory Visit | Attending: Family Medicine | Admitting: Family Medicine

## 2023-05-20 DIAGNOSIS — H538 Other visual disturbances: Secondary | ICD-10-CM | POA: Diagnosis not present

## 2023-05-20 DIAGNOSIS — G43B0 Ophthalmoplegic migraine, not intractable: Secondary | ICD-10-CM | POA: Diagnosis not present

## 2023-05-20 DIAGNOSIS — G43109 Migraine with aura, not intractable, without status migrainosus: Secondary | ICD-10-CM

## 2023-05-21 ENCOUNTER — Other Ambulatory Visit: Payer: Self-pay | Admitting: Family Medicine

## 2023-05-21 DIAGNOSIS — F32A Depression, unspecified: Secondary | ICD-10-CM

## 2023-05-25 ENCOUNTER — Encounter: Payer: Self-pay | Admitting: Family Medicine

## 2023-05-25 DIAGNOSIS — E785 Hyperlipidemia, unspecified: Secondary | ICD-10-CM

## 2023-05-26 MED ORDER — FENOFIBRATE 160 MG PO TABS
160.0000 mg | ORAL_TABLET | Freq: Every day | ORAL | 1 refills | Status: DC
Start: 2023-05-26 — End: 2023-11-23

## 2023-05-28 ENCOUNTER — Encounter: Payer: Self-pay | Admitting: Family Medicine

## 2023-05-28 ENCOUNTER — Ambulatory Visit: Payer: BC Managed Care – PPO | Admitting: Family Medicine

## 2023-05-28 VITALS — BP 130/90 | HR 70 | Temp 98.5°F | Resp 18 | Ht 62.5 in | Wt 176.2 lb

## 2023-05-28 DIAGNOSIS — E039 Hypothyroidism, unspecified: Secondary | ICD-10-CM

## 2023-05-28 DIAGNOSIS — I10 Essential (primary) hypertension: Secondary | ICD-10-CM | POA: Diagnosis not present

## 2023-05-28 DIAGNOSIS — E785 Hyperlipidemia, unspecified: Secondary | ICD-10-CM

## 2023-05-28 DIAGNOSIS — G43109 Migraine with aura, not intractable, without status migrainosus: Secondary | ICD-10-CM | POA: Diagnosis not present

## 2023-05-28 MED ORDER — HYDROCHLOROTHIAZIDE 25 MG PO TABS
25.0000 mg | ORAL_TABLET | Freq: Every day | ORAL | 3 refills | Status: DC
Start: 2023-05-28 — End: 2023-09-01

## 2023-05-28 NOTE — Patient Instructions (Signed)

## 2023-05-28 NOTE — Progress Notes (Signed)
   Established Patient Office Visit  Subjective   Patient ID: Sofija Antwi, female    DOB: [birth year]  Age: 57 y.o. MRN: [id number]  Chief Complaint  Patient presents with   Hypertension   Follow-up    HPI    ROS    Objective:     BP (!) 130/90 (BP Location: Left Arm, Patient Position: Sitting, Cuff Size: Large)   Pulse 70   Temp 98.5 F (36.9 C) (Oral)   Resp 18   Ht 5' 2.5" (1.588 m)   Wt 176 lb 3.2 oz (79.9 kg)   SpO2 98%   BMI 31.71 kg/m    Physical Exam   No results found for any visits on 05/28/23.    The 10-year ASCVD risk score (Arnett DK, et al., 2019) is: 4.3%    Assessment & Plan:   Problem List Items Addressed This Visit       Unprioritized   Hyperlipidemia LDL goal <100 - Primary   Relevant Medications   hydrochlorothiazide (HYDRODIURIL) 25 MG tablet   Hypothyroidism   Other Visit Diagnoses     Essential hypertension       Relevant Medications   hydrochlorothiazide (HYDRODIURIL) 25 MG tablet   Ocular migraine       Relevant Medications   hydrochlorothiazide (HYDRODIURIL) 25 MG tablet   Other Relevant Orders   Ambulatory referral to Neurology       Return in about 3 weeks (around 06/18/2023), or if symptoms worsen or fail to improve, for hypertension.    Donato Schultz, DO

## 2023-06-22 ENCOUNTER — Other Ambulatory Visit (HOSPITAL_COMMUNITY): Payer: Self-pay | Admitting: Psychiatry

## 2023-06-23 ENCOUNTER — Other Ambulatory Visit: Payer: Self-pay | Admitting: Nurse Practitioner

## 2023-06-23 DIAGNOSIS — Z3041 Encounter for surveillance of contraceptive pills: Secondary | ICD-10-CM

## 2023-06-23 DIAGNOSIS — N920 Excessive and frequent menstruation with regular cycle: Secondary | ICD-10-CM

## 2023-06-23 DIAGNOSIS — G43829 Menstrual migraine, not intractable, without status migrainosus: Secondary | ICD-10-CM

## 2023-06-23 NOTE — Telephone Encounter (Signed)
Medication refill request: Vienva  Last AEX:  05/07/22 Next AEX: not scheduled Last MMG (if hormonal medication request): n/a Refill authorized: #[id number] with no rf

## 2023-08-05 ENCOUNTER — Other Ambulatory Visit: Payer: Self-pay | Admitting: Family Medicine

## 2023-08-05 DIAGNOSIS — E785 Hyperlipidemia, unspecified: Secondary | ICD-10-CM

## 2023-08-17 ENCOUNTER — Other Ambulatory Visit: Payer: Self-pay | Admitting: Family Medicine

## 2023-08-17 DIAGNOSIS — F32A Depression, unspecified: Secondary | ICD-10-CM

## 2023-08-28 ENCOUNTER — Telehealth (HOSPITAL_COMMUNITY): Payer: Self-pay | Admitting: Psychiatry

## 2023-08-28 ENCOUNTER — Ambulatory Visit: Payer: BC Managed Care – PPO | Admitting: Family Medicine

## 2023-08-28 DIAGNOSIS — F411 Generalized anxiety disorder: Secondary | ICD-10-CM

## 2023-08-28 MED ORDER — ALPRAZOLAM 0.25 MG PO TABS: 0.2500 mg | ORAL_TABLET | Freq: Every day | ORAL | 0 refills | Status: DC | PRN

## 2023-08-28 NOTE — Telephone Encounter (Signed)
 I have sent Xanax to pharmacy for 30 days supply.

## 2023-08-28 NOTE — Telephone Encounter (Signed)
 Patient called requesting refill of:   ALPRAZolam  (XANAX ) 0.25 MG tablet  Last ordered: 03/24/2023 - 30 tablets with 2 refills  [facility] - [place], [place] - [address] (Ph: [phone])   Last visit: 03/24/2023  Next visit: 09/22/2023

## 2023-09-01 ENCOUNTER — Ambulatory Visit: Payer: BC Managed Care – PPO | Admitting: Family Medicine

## 2023-09-01 ENCOUNTER — Encounter: Payer: Self-pay | Admitting: Family Medicine

## 2023-09-01 VITALS — BP 128/88 | HR 84 | Temp 98.0°F | Resp 16 | Ht 62.5 in | Wt 178.2 lb

## 2023-09-01 DIAGNOSIS — E039 Hypothyroidism, unspecified: Secondary | ICD-10-CM | POA: Diagnosis not present

## 2023-09-01 DIAGNOSIS — I10 Essential (primary) hypertension: Secondary | ICD-10-CM | POA: Diagnosis not present

## 2023-09-01 DIAGNOSIS — R739 Hyperglycemia, unspecified: Secondary | ICD-10-CM

## 2023-09-01 DIAGNOSIS — E785 Hyperlipidemia, unspecified: Secondary | ICD-10-CM | POA: Diagnosis not present

## 2023-09-01 DIAGNOSIS — E538 Deficiency of other specified B group vitamins: Secondary | ICD-10-CM

## 2023-09-01 LAB — CBC WITH DIFFERENTIAL/PLATELET
Basophils Absolute: 0 10*3/uL (ref 0.0–0.1)
Basophils Relative: 0.8 % (ref 0.0–3.0)
Eosinophils Absolute: 0.1 10*3/uL (ref 0.0–0.7)
Eosinophils Relative: 3.2 % (ref 0.0–5.0)
HCT: 38.3 % (ref 36.0–46.0)
Hemoglobin: 12.1 g/dL (ref 12.0–15.0)
Lymphocytes Relative: 39.6 % (ref 12.0–46.0)
Lymphs Abs: 1.7 10*3/uL (ref 0.7–4.0)
MCHC: 31.6 g/dL (ref 30.0–36.0)
MCV: 74.1 fL — ABNORMAL LOW (ref 78.0–100.0)
Monocytes Absolute: 0.3 10*3/uL (ref 0.1–1.0)
Monocytes Relative: 7.1 % (ref 3.0–12.0)
Neutro Abs: 2.1 10*3/uL (ref 1.4–7.7)
Neutrophils Relative %: 49.3 % (ref 43.0–77.0)
Platelets: 397 10*3/uL (ref 150.0–400.0)
RBC: 5.17 Mil/uL — ABNORMAL HIGH (ref 3.87–5.11)
RDW: 16.6 % — ABNORMAL HIGH (ref 11.5–15.5)
WBC: 4.3 10*3/uL (ref 4.0–10.5)

## 2023-09-01 LAB — COMPREHENSIVE METABOLIC PANEL
ALT: 27 U/L (ref 0–35)
AST: 29 U/L (ref 0–37)
Albumin: 4.3 g/dL (ref 3.5–5.2)
Alkaline Phosphatase: 58 U/L (ref 39–117)
BUN: 18 mg/dL (ref 6–23)
CO2: 28 meq/L (ref 19–32)
Calcium: 9.7 mg/dL (ref 8.4–10.5)
Chloride: 99 meq/L (ref 96–112)
Creatinine, Ser: 0.74 mg/dL (ref 0.40–1.20)
GFR: 91.13 mL/min (ref >60.00)
Glucose, Bld: 107 mg/dL — ABNORMAL HIGH (ref 70–99)
Potassium: 4.8 meq/L (ref 3.5–5.1)
Sodium: 137 meq/L (ref 135–145)
Total Bilirubin: 0.4 mg/dL (ref 0.2–1.2)
Total Protein: 7.2 g/dL (ref 6.0–8.3)

## 2023-09-01 LAB — VITAMIN B12: Vitamin B-12: 353 pg/mL (ref 211–911)

## 2023-09-01 LAB — TSH: TSH: 0.44 u[IU]/mL (ref 0.35–5.50)

## 2023-09-01 LAB — LIPID PANEL
Cholesterol: 169 mg/dL (ref 0–200)
HDL: 48.7 mg/dL (ref >39.00)
LDL Cholesterol: 92 mg/dL (ref 0–99)
NonHDL: 120.45
Total CHOL/HDL Ratio: 3
Triglycerides: 143 mg/dL (ref 0.0–149.0)
VLDL: 28.6 mg/dL (ref 0.0–40.0)

## 2023-09-01 LAB — HEMOGLOBIN A1C: Hgb A1c MFr Bld: 7.2 % — ABNORMAL HIGH (ref 4.6–6.5)

## 2023-09-01 MED ORDER — AMLODIPINE BESYLATE 5 MG PO TABS: 5.0000 mg | ORAL_TABLET | Freq: Every day | ORAL | 1 refills | Status: DC

## 2023-09-01 MED ORDER — HYDROCHLOROTHIAZIDE 25 MG PO TABS: 25.0000 mg | ORAL_TABLET | Freq: Every day | ORAL | 3 refills | Status: AC

## 2023-09-01 MED ORDER — LEVOTHYROXINE SODIUM 150 MCG PO TABS: 150.0000 ug | ORAL_TABLET | Freq: Every day | ORAL | 1 refills | Status: DC

## 2023-09-01 NOTE — Assessment & Plan Note (Signed)
 Encourage heart healthy diet such as MIND or DASH diet, increase exercise, avoid trans fats, simple carbohydrates and processed foods, consider a krill or fish or flaxseed oil cap daily.

## 2023-09-01 NOTE — Assessment & Plan Note (Signed)
 Well controlled, no changes to meds. Encouraged heart healthy diet such as the DASH diet and exercise as tolerated.

## 2023-09-01 NOTE — Assessment & Plan Note (Signed)
 Check labs

## 2023-09-01 NOTE — Progress Notes (Signed)
 Established Patient Office Visit  Subjective   Patient ID: Katie [name], female    DOB: 05/12/1966  Age: 58 y.o. MRN: [id number]  Chief Complaint  Patient presents with   Hypertension   Follow-up    HPI Discussed the use of AI scribe software for clinical note transcription with the patient, who gave verbal consent to proceed.  History of Present Illness   The patient, with a history of hypertension, hyperlipidemia, and hypothyroidism, presents for a routine follow-up. She reports no significant weight loss, and in fact, has gained two pounds since the last visit. She has been compliant with her medications, including Crestor , levothyroxine , and hydrochlorothiazide . However, she notes that the diuretic effect of hydrochlorothiazide  seems to have diminished, as she no longer experiences increased urination.  The patient also reports taking amlodipine   and has recently refilled her Xanax  prescription. She has been supplementing with B12, alternating between liquid and pill forms, but expresses dissatisfaction with the taste of both.  Previously, the patient experienced visual disturbances described as stars or lights, which have since improved. She speculates that this improvement may be due to better control of her blood pressure. She denies any associated headaches, and notes that she used to suffer from migraines, which were linked to her menstrual cycle.  The patient denies any current swelling in the ankles. She is due for an eye examination in May. She has a family member stationed in [place]  and plans to visit in July.      Patient Active Problem List   Diagnosis Date Noted   RUQ abdominal pain 09/05/2022   Midepigastric pain 09/05/2022   Jaw pain 01/09/2022   Right hip pain 04/05/2021   History of endometrial ablation 05/16/2020   Allergy    Close exposure to COVID-19 virus 02/10/2019   HTN (hypertension) 02/10/2019   Hashimoto's thyroiditis 10/26/2018   Need for  shingles vaccine 10/13/2018   Hyperlipidemia LDL goal <100 10/01/2017   Type 2 diabetes mellitus with hyperglycemia, without long-term current use of insulin (HCC) 10/01/2017   Preventative health care 03/21/2017   Hyperglycemia 03/21/2017   Menstrual migraine without status migrainosus, not intractable 04/17/2015   Migraine 04/10/2015   Viral syndrome 04/20/2014   Obesity (BMI 30-39.9) 03/14/2013   Sinusitis 08/24/2011   RLS (restless legs syndrome) 12/13/2009   OBSTRUCTIVE SLEEP APNEA 12/12/2009   Anxiety 02/01/2009   Hyperlipidemia 01/19/2009   HYPOKALEMIA, MILD 01/19/2009   DYSPEPSIA 09/28/2008   Chest pain 07/19/2008   Insomnia 07/03/2008   PLANTAR FASCIITIS, BILATERAL 06/20/2008   Hypothyroidism 02/22/2007   DEVIATED NASAL SEPTUM 02/22/2007   PREGNANCY, ECTOPIC NEC W/INTRAUTERINE PRG 02/22/2007   HEADACHE 02/22/2007   Other postprocedural status(V45.89) 02/22/2007   Past Medical History:  Diagnosis Date   Allergy    Anemia    Anxiety    Blood transfusion without reported diagnosis    Hyperlipidemia    Hypertension    Migraine    PVC (premature ventricular contraction)    RLS (restless legs syndrome)    Thyroid  disease    Past Surgical History:  Procedure Laterality Date   BUNIONECTOMY  06/04/2009   Right foot   CARPAL TUNNEL RELEASE     Bilateral   CRYOABLATION  11/04   ECTOPIC PREGNANCY SURGERY  1998   ENDOMETRIAL ABLATION     SEPTOPLASTY  6/05   TUBAL LIGATION  04/1999   UPPER GASTROINTESTINAL ENDOSCOPY     Social History   Tobacco Use   Smoking status: Former  Current packs/day: 0.00    Types: Cigarettes    Quit date: 01/14/1995    Years since quitting: 28.6   Smokeless tobacco: Never   Tobacco comments:    started smoking at age 20 1ppd  Vaping Use   Vaping status: Never Used  Substance Use Topics   Alcohol use: Not Currently    Alcohol/week: 2.0 standard drinks of alcohol    Types: 2 Cans of beer per week    Comment: Reports a couple of  beers 1- 2 times a month   Drug use: No   Social History   Socioeconomic History   Marital status: Married    Spouse name: Not on file   Number of children: 2   Years of education: 15   Highest education level: Some college, no degree  Occupational History   Occupation: systems developer [employer]  Tobacco Use   Smoking status: Former    Current packs/day: 0.00    Types: Cigarettes    Quit date: 01/14/1995    Years since quitting: 28.6   Smokeless tobacco: Never   Tobacco comments:    started smoking at age 80 1ppd  Vaping Use   Vaping status: Never Used  Substance and Sexual Activity   Alcohol use: Not Currently    Alcohol/week: 2.0 standard drinks of alcohol    Types: 2 Cans of beer per week    Comment: Reports a couple of beers 1- 2 times a month   Drug use: No   Sexual activity: Not Currently    Partners: Male    Comment: 1st intercourse 58 yo-More than 5 partners-BTL  Other Topics Concern   Not on file  Social History Narrative   Exercise-- no   Social Drivers of Health   Financial Resource Strain: Patient Declined (04/30/2023)   Overall Financial Resource Strain (CARDIA)    Difficulty of Paying Living Expenses: Patient declined  Food Insecurity: No Food Insecurity (04/30/2023)   Hunger Vital Sign    Worried About Running Out of Food in the Last Year: Never true    Ran Out of Food in the Last Year: Never true  Transportation Needs: No Transportation Needs (04/30/2023)   PRAPARE - Administrator, Civil Service (Medical): No    Lack of Transportation (Non-Medical): No  Physical Activity: Unknown (04/30/2023)   Exercise Vital Sign    Days of Exercise per Week: Patient declined    Minutes of Exercise per Session: Not on file  Stress: Patient Declined (04/30/2023)   [employer] of Occupational Health - Occupational Stress Questionnaire    Feeling of Stress : Patient declined  Social Connections: Unknown (04/30/2023)   Social Connection and Isolation Panel  [NHANES]    Frequency of Communication with Friends and Family: Patient declined    Frequency of Social Gatherings with Friends and Family: Patient declined    Attends Religious Services: Never    Database Administrator or Organizations: No    Attends Engineer, Structural: Not on file    Marital Status: Married  Catering Manager Violence: Not on file   Family Status  Relation Name Status   Father  Deceased   Mother  Deceased   Sister  Alive   Sister  Alive   MGM  (Not Specified)   MGF  (Not Specified)   PGM  (Not Specified)   PGF  (Not Specified)   Neg Hx  (Not Specified)  No partnership data on file   Family History  Problem Relation Age of Onset   Depression Father        Suicide   Aneurysm Mother        Brain   Hypertension Sister    Thyroid  disease Sister    Diabetes Maternal Grandmother    Alzheimer's disease Maternal Grandmother    Uterine cancer Maternal Grandmother    Hypertension Maternal Grandmother    Osteoporosis Maternal Grandmother    Cancer Maternal Grandmother        uterine   Lung cancer Maternal Grandfather    Cancer Maternal Grandfather        lung   Cancer Paternal Grandmother        metatstatic-- ? primary colon   Cancer Paternal Grandfather        skin, hepatic   Colon cancer Paternal Grandfather        age unknown   Colon polyps Neg Hx    Rectal cancer Neg Hx    Stomach cancer Neg Hx    Esophageal cancer Neg Hx    Allergies  Allergen Reactions   Morphine     Nausea & vomiting   Lisinopril      sweating    Review of Systems  Constitutional:  Negative for fever and malaise/fatigue.  HENT:  Negative for congestion.   Eyes:  Negative for blurred vision.  Respiratory:  Negative for cough and shortness of breath.   Cardiovascular:  Negative for chest pain, palpitations and leg swelling.  Gastrointestinal:  Negative for abdominal pain, blood in stool, nausea and vomiting.  Genitourinary:  Negative for dysuria and frequency.   Musculoskeletal:  Negative for back pain and falls.  Skin:  Negative for rash.  Neurological:  Negative for dizziness, loss of consciousness and headaches.  Endo/Heme/Allergies:  Negative for environmental allergies.  Psychiatric/Behavioral:  Negative for depression. The patient is not nervous/anxious.       Objective:     BP 128/88 (BP Location: Left Arm, Patient Position: Sitting, Cuff Size: Normal)   Pulse 84   Temp 98 F (36.7 C) (Oral)   Resp 16   Ht 5' 2.5 (1.588 m)   Wt 178 lb 3.2 oz (80.8 kg)   SpO2 98%   BMI 32.07 kg/m  BP Readings from Last 3 Encounters:  09/01/23 128/88  05/28/23 (!) 130/90  04/30/23 136/88   Wt Readings from Last 3 Encounters:  09/01/23 178 lb 3.2 oz (80.8 kg)  05/28/23 176 lb 3.2 oz (79.9 kg)  04/30/23 176 lb 9.6 oz (80.1 kg)   SpO2 Readings from Last 3 Encounters:  09/01/23 98%  05/28/23 98%  04/30/23 98%      Physical Exam Vitals and nursing note reviewed.  Constitutional:      General: She is not in acute distress.    Appearance: Normal appearance. She is well-developed.  HENT:     Head: Normocephalic and atraumatic.  Eyes:     General: No scleral icterus.       Right eye: No discharge.        Left eye: No discharge.  Cardiovascular:     Rate and Rhythm: Normal rate and regular rhythm.     Heart sounds: No murmur heard. Pulmonary:     Effort: Pulmonary effort is normal. No respiratory distress.     Breath sounds: Normal breath sounds.  Musculoskeletal:        General: Normal range of motion.     Cervical back: Normal range of motion and neck supple.  Right lower leg: No edema.     Left lower leg: No edema.  Skin:    General: Skin is warm and dry.  Neurological:     Mental Status: She is alert and oriented to person, place, and time.  Psychiatric:        Mood and Affect: Mood normal.        Behavior: Behavior normal.        Thought Content: Thought content normal.        Judgment: Judgment normal.      No  results found for any visits on 09/01/23.  Last CBC Lab Results  Component Value Date   WBC 5.3 04/30/2023   HGB 11.6 (L) 04/30/2023   HCT 36.7 04/30/2023   MCV 71.7 (L) 04/30/2023   MCH 25.7 (L) 11/01/2021   RDW 16.3 (H) 04/30/2023   PLT 476.0 (H) 04/30/2023   Last metabolic panel Lab Results  Component Value Date   GLUCOSE 86 04/30/2023   NA 136 04/30/2023   K 4.7 04/30/2023   CL 101 04/30/2023   CO2 27 04/30/2023   BUN 11 04/30/2023   CREATININE 0.83 04/30/2023   GFR 79.59 04/30/2023   CALCIUM  9.5 04/30/2023   PROT 7.6 04/30/2023   ALBUMIN 4.1 04/30/2023   BILITOT 0.3 04/30/2023   ALKPHOS 57 04/30/2023   AST 15 04/30/2023   ALT 12 04/30/2023   Last lipids Lab Results  Component Value Date   CHOL 135 04/30/2023   HDL 44.10 04/30/2023   LDLCALC 66 04/30/2023   LDLDIRECT 62.0 04/09/2020   TRIG 126.0 04/30/2023   CHOLHDL 3 04/30/2023   Last hemoglobin A1c Lab Results  Component Value Date   HGBA1C 6.9 (H) 04/30/2023   Last thyroid  functions Lab Results  Component Value Date   TSH 2.33 04/30/2023   T4TOTAL 11.5 04/05/2021   Last vitamin D No results found for: 25OHVITD2, 25OHVITD3, VD25OH Last vitamin B12 and Folate Lab Results  Component Value Date   VITAMINB12 76 (L) 04/30/2023      The 10-year ASCVD risk score (Arnett DK, et al., 2019) is: 4.2%    Assessment & Plan:   Problem List Items Addressed This Visit       Unprioritized   Hyperlipidemia LDL goal <100 - Primary   Relevant Medications   amLODipine  (NORVASC ) 5 MG tablet   hydrochlorothiazide  (HYDRODIURIL ) 25 MG tablet   Other Relevant Orders   Comprehensive metabolic panel   Lipid panel   Hyperglycemia   Relevant Orders   Hemoglobin A1c   Hypothyroidism   Check labs      Relevant Medications   levothyroxine  (SYNTHROID ) 150 MCG tablet   Other Relevant Orders   TSH   Hyperlipidemia   Encourage heart healthy diet such as MIND or DASH diet, increase exercise, avoid trans  fats, simple carbohydrates and processed foods, consider a krill or fish or flaxseed oil cap daily.        Relevant Medications   amLODipine  (NORVASC ) 5 MG tablet   hydrochlorothiazide  (HYDRODIURIL ) 25 MG tablet   HTN (hypertension)   Well controlled, no changes to meds. Encouraged heart healthy diet such as the DASH diet and exercise as tolerated.        Relevant Medications   amLODipine  (NORVASC ) 5 MG tablet   hydrochlorothiazide  (HYDRODIURIL ) 25 MG tablet   Other Visit Diagnoses       Essential hypertension       Relevant Medications   amLODipine  (NORVASC ) 5 MG tablet  hydrochlorothiazide  (HYDRODIURIL ) 25 MG tablet   Other Relevant Orders   CBC with Differential/Platelet   Comprehensive metabolic panel   Lipid panel     B12 deficiency       Relevant Orders   Vitamin B12     Assessment and Plan    Hypertension Hypertension is well-controlled with reduced visual disturbances, no headaches, or ankle swelling. She discussed not experiencing increased urination with hydrochlorothiazide . We will continue amlodipine  and hydrochlorothiazide , monitor blood pressure regularly, and follow up in six months or sooner if needed.  Hyperlipidemia Hyperlipidemia is managed with Crestor , which was refilled last month for six months. We will continue Crestor  as prescribed.  Hypothyroidism Hypothyroidism is managed with levothyroxine , which may need a refill soon. We will refill levothyroxine  as needed.  Anxiety Anxiety is managed with Xanax , recently refilled, and she is scheduled to see her provider this month. We will continue Xanax  as prescribed and follow up with the prescribing provider as scheduled.  Vitamin B12 Deficiency Vitamin B12 deficiency is managed with both liquid and pill forms, with the liquid form used intermittently due to its unpalatable taste. We will continue B12 supplementation as tolerated.  General Health Maintenance Routine health maintenance is up to date,  with an eye doctor appointment scheduled for May. She should attend the scheduled eye doctor appointment in May.  Follow-up We will follow up in six months or sooner if needed and proceed to the lab for tests as indicated.        No follow-ups on file.    Tricha Ruggirello R Lowne Chase, DO

## 2023-09-09 ENCOUNTER — Other Ambulatory Visit: Payer: Self-pay | Admitting: Family Medicine

## 2023-09-09 DIAGNOSIS — E1165 Type 2 diabetes mellitus with hyperglycemia: Secondary | ICD-10-CM

## 2023-09-09 DIAGNOSIS — I10 Essential (primary) hypertension: Secondary | ICD-10-CM

## 2023-09-09 DIAGNOSIS — E785 Hyperlipidemia, unspecified: Secondary | ICD-10-CM

## 2023-09-10 ENCOUNTER — Other Ambulatory Visit: Payer: Self-pay

## 2023-09-10 MED ORDER — METFORMIN HCL ER 500 MG PO TB24: 500.0000 mg | ORAL_TABLET | Freq: Every day | ORAL | 0 refills | Status: AC

## 2023-09-15 ENCOUNTER — Other Ambulatory Visit: Payer: Self-pay | Admitting: Nurse Practitioner

## 2023-09-15 DIAGNOSIS — G43829 Menstrual migraine, not intractable, without status migrainosus: Secondary | ICD-10-CM

## 2023-09-15 DIAGNOSIS — Z3041 Encounter for surveillance of contraceptive pills: Secondary | ICD-10-CM

## 2023-09-15 DIAGNOSIS — N920 Excessive and frequent menstruation with regular cycle: Secondary | ICD-10-CM

## 2023-09-16 ENCOUNTER — Other Ambulatory Visit: Payer: Self-pay

## 2023-09-16 DIAGNOSIS — N920 Excessive and frequent menstruation with regular cycle: Secondary | ICD-10-CM

## 2023-09-16 DIAGNOSIS — Z3041 Encounter for surveillance of contraceptive pills: Secondary | ICD-10-CM

## 2023-09-16 DIAGNOSIS — G43829 Menstrual migraine, not intractable, without status migrainosus: Secondary | ICD-10-CM

## 2023-09-16 MED ORDER — LEVONORGESTREL-ETHINYL ESTRAD 0.1-20 MG-MCG PO TABS: 1.0000 | ORAL_TABLET | Freq: Every day | ORAL | 0 refills | Status: DC

## 2023-09-16 NOTE — Telephone Encounter (Signed)
Med refill request: [facility] Last AEX: 05/13/2022 Next AEX:not scheduled, sent msg to front desk to schedule patient for annual visit Last MMG (if hormonal med) Refill authorized: Last Rx sent #[phone] with zero refills on 06/23/2023. Please approve or deny as appropriate.

## 2023-09-16 NOTE — Telephone Encounter (Signed)
Medication refill request: Katie [name]  Last AEX:  05/13/22 Next AEX: 11/12/23 Last MMG (if hormonal medication request): 12/01/22 bi-rads 1 neg  Refill authorized: #[id number] with 0 rf pended

## 2023-09-18 ENCOUNTER — Telehealth: Payer: Self-pay

## 2023-09-18 NOTE — Telephone Encounter (Signed)
Spoke to pt via phone call. Pt was wanting refill of OCP. Filled 09/16/23 by TW. Pt is going to reach back out if needed.

## 2023-09-21 ENCOUNTER — Ambulatory Visit: Payer: BC Managed Care – PPO | Admitting: Neurology

## 2023-09-22 ENCOUNTER — Ambulatory Visit (HOSPITAL_COMMUNITY): Payer: BC Managed Care – PPO | Admitting: Psychiatry

## 2023-09-22 ENCOUNTER — Encounter (HOSPITAL_COMMUNITY): Payer: Self-pay | Admitting: Psychiatry

## 2023-09-22 VITALS — BP 127/90 | HR 97 | Temp 97.5°F | Ht 62.5 in | Wt 158.0 lb

## 2023-09-22 DIAGNOSIS — F5102 Adjustment insomnia: Secondary | ICD-10-CM | POA: Diagnosis not present

## 2023-09-22 DIAGNOSIS — F0630 Mood disorder due to known physiological condition, unspecified: Secondary | ICD-10-CM | POA: Diagnosis not present

## 2023-09-22 DIAGNOSIS — F411 Generalized anxiety disorder: Secondary | ICD-10-CM | POA: Diagnosis not present

## 2023-09-22 DIAGNOSIS — F419 Anxiety disorder, unspecified: Secondary | ICD-10-CM | POA: Diagnosis not present

## 2023-09-22 MED ORDER — ZOLPIDEM TARTRATE 10 MG PO TABS: 10.0000 mg | ORAL_TABLET | Freq: Every evening | ORAL | 2 refills | Status: DC | PRN

## 2023-09-22 MED ORDER — ALPRAZOLAM 0.25 MG PO TABS: 0.2500 mg | ORAL_TABLET | Freq: Every day | ORAL | 1 refills | Status: DC | PRN

## 2023-09-22 NOTE — Progress Notes (Signed)
[facility] Follow up visit  Patient Identification: Katie [name] MRN:  [id number] Date of Evaluation:  09/22/2023 Referral Source: primary care Chief Complaint:  follow up insomnia Chief Complaint   Follow-up      Visit Diagnosis:    ICD-10-CM   1. GAD (generalized anxiety disorder)  F41.1 ALPRAZolam (XANAX) 0.25 MG tablet    2. Mood disorder in conditions classified elsewhere  F06.30     3. Adjustment insomnia  F51.02     4. 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 currently she is living with her husband  Patient is doing well, sleeps fair on meds and anxiety is manageable  Planning to go [place] to visit son this summer where he is stationed     Depression stable  Stress varies but manageable with pristiq, xanax   Aggravating factors;job can be,   distant relationship with son Modifying factors ;son   Duration since young age Severity stable She was seen at age 58 by her counselor after her father's death 1 time no medication given       Past Psychiatric History: insomnia, anxiety  Previous Psychotropic Medications: Yes  Zoloft, seroquel   Past Medical History:  Past Medical History:  Diagnosis Date   Allergy    Anemia    Anxiety    Blood transfusion without reported diagnosis    Hyperlipidemia    Hypertension    Migraine    PVC (premature ventricular contraction)    RLS (restless legs syndrome)    Thyroid disease     Past Surgical History:  Procedure Laterality Date   BUNIONECTOMY  06/04/2009   Right foot   CARPAL TUNNEL RELEASE     Bilateral   CRYOABLATION  11/04   ECTOPIC PREGNANCY SURGERY  1998   ENDOMETRIAL ABLATION     SEPTOPLASTY  6/05   TUBAL LIGATION  04/1999   UPPER GASTROINTESTINAL ENDOSCOPY       Family Psychiatric History: Dad: committed suicide, see chart , Niece; depression  Family History:  Family History  Problem Relation Age of Onset   Depression Father        Suicide   Aneurysm Mother        Brain   Hypertension Sister    Thyroid disease Sister    Diabetes Maternal Grandmother    Alzheimer's disease Maternal Grandmother    Uterine cancer Maternal Grandmother    Hypertension Maternal Grandmother    Osteoporosis Maternal Grandmother    Cancer Maternal Grandmother        uterine   Lung cancer Maternal Grandfather    Cancer Maternal Grandfather        lung   Cancer Paternal Grandmother        metatstatic-- ? primary colon   Cancer Paternal Grandfather        skin, hepatic   Colon cancer Paternal Grandfather        age unknown   Colon polyps Neg Hx    Rectal cancer Neg Hx    Stomach cancer Neg Hx    Esophageal cancer Neg Hx     Social History:   Social History   Socioeconomic History   Marital status: Married    Spouse name: Not on file   Number of  children: 2   Years of education: 15   Highest education level: Some college, no degree  Occupational History   Occupation: scheduler [employer]  Tobacco Use   Smoking status: Former    Current packs/day: 0.00    Types: Cigarettes    Quit date: 01/14/1995    Years since quitting: 28.7   Smokeless tobacco: Never   Tobacco comments:    started smoking at age 53 1ppd  Vaping Use   Vaping status: Never Used  Substance and Sexual Activity   Alcohol use: Not Currently    Alcohol/week: 2.0 standard drinks of alcohol    Types: 2 Cans of beer per week    Comment: Reports a couple of beers 1- 2 times a month   Drug use: No   Sexual activity: Not Currently    Partners: Male    Comment: 1st intercourse 58 yo-More than 5 partners-BTL  Other Topics Concern   Not on file  Social History Narrative   Exercise-- no   Social Drivers of Health   Financial Resource Strain: Patient Declined (04/30/2023)    Overall Financial Resource Strain (CARDIA)    Difficulty of Paying Living Expenses: Patient declined  Food Insecurity: No Food Insecurity (04/30/2023)   Hunger Vital Sign    Worried About Running Out of Food in the Last Year: Never true    Ran Out of Food in the Last Year: Never true  Transportation Needs: No Transportation Needs (04/30/2023)   PRAPARE - Administrator, Civil Service (Medical): No    Lack of Transportation (Non-Medical): No  Physical Activity: Unknown (04/30/2023)   Exercise Vital Sign    Days of Exercise per Week: Patient declined    Minutes of Exercise per Session: Not on file  Stress: Patient Declined (04/30/2023)   [employer] of Occupational Health - Occupational Stress Questionnaire    Feeling of Stress : Patient declined  Social Connections: Unknown (04/30/2023)   Social Connection and Isolation Panel [NHANES]    Frequency of Communication with Friends and Family: Patient declined    Frequency of Social Gatherings with Friends and Family: Patient declined    Attends Religious Services: Never    Database administrator or Organizations: No    Attends Engineer, structural: Not on file    Marital Status: Married     Allergies:   Allergies  Allergen Reactions   Morphine     Nausea & vomiting   Lisinopril     sweating    Metabolic Disorder Labs: Lab Results  Component Value Date   HGBA1C 7.2 (H) 09/01/2023   No results found for: "PROLACTIN" Lab Results  Component Value Date   CHOL 169 09/01/2023   TRIG 143.0 09/01/2023   HDL 48.70 09/01/2023   CHOLHDL 3 09/01/2023   VLDL 28.6 09/01/2023   LDLCALC 92 09/01/2023   LDLCALC 66 04/30/2023   Lab Results  Component Value Date   TSH 0.44 09/01/2023    Therapeutic Level Labs: No results found for: "LITHIUM" No results found for: "CBMZ" No results found for: "VALPROATE"  Current Medications: Current Outpatient Medications  Medication Sig Dispense Refill   amLODipine (NORVASC)  5 MG tablet Take 1 tablet (5 mg total) by mouth daily. 90 tablet 1   cetirizine (ZYRTEC) 10 MG tablet Take 1 tablet (10 mg total) by mouth daily.     desvenlafaxine (PRISTIQ) 100 MG 24 hr tablet Take 1 tablet (100 mg total) by mouth daily. 90 tablet 0  fenofibrate 160 MG tablet Take 1 tablet (160 mg total) by mouth daily. 90 tablet 1   hydrochlorothiazide (HYDRODIURIL) 25 MG tablet Take 1 tablet (25 mg total) by mouth daily. 90 tablet 3   lansoprazole (PREVACID) 30 MG capsule TAKE 1 CAPSULE BY MOUTH DAILY BEFORE BREAKFAST 30 capsule 3   levonorgestrel-ethinyl estradiol (VIENVA) 0.1-20 MG-MCG tablet Take 1 tablet by mouth daily. 84 tablet 0   levothyroxine (SYNTHROID) 150 MCG tablet Take 1 tablet (150 mcg total) by mouth daily before breakfast. 90 tablet 1   metFORMIN (GLUCOPHAGE-XR) 500 MG 24 hr tablet Take 1 tablet (500 mg total) by mouth daily with breakfast. 90 tablet 0   rosuvastatin (CRESTOR) 10 MG tablet Take 1 tablet (10 mg total) by mouth daily. 90 tablet 1   ALPRAZolam (XANAX) 0.25 MG tablet Take 1 tablet (0.25 mg total) by mouth daily as needed for anxiety. 30 tablet 1   zolpidem (AMBIEN) 10 MG tablet Take 1 tablet (10 mg total) by mouth at bedtime as needed. for sleep 30 tablet 2   No current facility-administered medications for this visit.      Psychiatric Specialty Exam: Review of Systems  Cardiovascular:  Negative for chest pain and palpitations.  Neurological:  Negative for tremors.  Psychiatric/Behavioral:  Negative for self-injury and suicidal ideas.     Blood pressure (!) 127/90, pulse 97, temperature (!) 97.5 F (36.4 C), height 5' 2.5" (1.588 m), weight 158 lb (71.7 kg).Body mass index is 28.44 kg/m.  General Appearance: Casual  Eye Contact:  Fair  Speech:  Clear and Coherent  Volume:  Normal  Mood:  fair   Affect:  Full Range  Thought Process:  Goal Directed  Orientation:  Full (Time, Place, and Person)  Thought Content:  Rumination  Suicidal Thoughts:  No   Homicidal Thoughts:  No  Memory:  Immediate;   Fair Recent;   Fair  Judgement:  Fair  Insight:  Fair  Psychomotor Activity:  Normal  Concentration:  Concentration: Fair and Attention Span: Fair  Recall:  [employer] of Knowledge:Good  Language: Good  Akathisia:  No  Handed:    AIMS (if indicated):  not done  Assets:  Desire for Improvement Financial Resources/Insurance Physical Health Social Support  ADL's:  Intact  Cognition: WNL  Sleep:  Poor   Screenings: AUDIT    Flowsheet Row Office Visit from 04/30/2023 in [facility] at [facility]  Alcohol Use Disorder Identification Test Final Score (AUDIT) 3       PHQ2-9    Flowsheet Row Office Visit from 05/28/2023 in [facility] at [facility] Office Visit from 01/09/2022 in [facility] at [facility] Office Visit from 12/13/2020 in [facility] at [facility] Office Visit from 10/19/2020 in [facility] at [facility] Office Visit from 03/19/2017 in [facility] at [facility]  PHQ-2 Total Score 0 0 0 0 0      Flowsheet Row Office Visit from 05/22/2022 in [facility] at [facility] Office Visit from 01/07/2022 in [facility] at [facility] Office Visit from 09/12/2021 in [facility] at [facility]  C-SSRS RISK CATEGORY No Risk No Risk No Risk       Assessment and Plan: as follows  Prior documentation reviewed  Mood disorder not otherwise specified; stable conitnue pristiq  Insomnia; manageable, continue sleep hygiene and also on ambien   Generalized anxiety disorder; doing fair continjue pristiq, xanax   Side effects reviewed , Direct care time spent in office: including face to face, chart review, documentation 15 minutes     Fu 34m.  Thresa Ross, MD 1/28/202510:35 AM

## 2023-11-11 ENCOUNTER — Other Ambulatory Visit: Payer: Self-pay | Admitting: Family Medicine

## 2023-11-11 DIAGNOSIS — F32A Depression, unspecified: Secondary | ICD-10-CM

## 2023-11-12 ENCOUNTER — Encounter: Payer: Self-pay | Admitting: Nurse Practitioner

## 2023-11-12 ENCOUNTER — Ambulatory Visit (INDEPENDENT_AMBULATORY_CARE_PROVIDER_SITE_OTHER): Payer: BC Managed Care – PPO | Admitting: Nurse Practitioner

## 2023-11-12 VITALS — BP 124/76 | HR 84 | Ht 63.25 in | Wt 172.0 lb

## 2023-11-12 DIAGNOSIS — Z01419 Encounter for gynecological examination (general) (routine) without abnormal findings: Secondary | ICD-10-CM

## 2023-11-12 DIAGNOSIS — N920 Excessive and frequent menstruation with regular cycle: Secondary | ICD-10-CM

## 2023-11-12 DIAGNOSIS — G43829 Menstrual migraine, not intractable, without status migrainosus: Secondary | ICD-10-CM

## 2023-11-12 MED ORDER — LEVONORGESTREL-ETHINYL ESTRAD 0.1-20 MG-MCG PO TABS: 1.0000 | ORAL_TABLET | Freq: Every day | ORAL | 3 refills | Status: DC

## 2023-11-12 NOTE — Progress Notes (Signed)
 Katie [name] 11/03/1965 [id number]   History:  58 y.o. [id number] presents for annual exam. OCPs continuously with withdrawal bleeding every 2 months.  2008 endometrial ablation with no bleeding initially but a few years later began having heavy cycles again. Normal pap and mammogram history. T2DM and Hashimoto's managed by primary care.   Gynecologic History Patient's last menstrual period was 10/20/2023 (approximate). Period Duration (Days): 4-5 Period Pattern: Regular Menstrual Flow: Light Menstrual Control: Maxi pad, Tampon Dysmenorrhea: (!) Mild Contraception: OCP (estrogen/progesterone) and tubal ligation Sexually active: No  Health Maintenance Last Pap: 05/13/2022. Results were: Normal neg HPV Last mammogram: 12/01/2022. Results were: Normal Last colonoscopy: 09/12/2020. Results were: Normal, 10-year recall Last Dexa: Not indicated Exercising: No. The patient has a physically strenuous job, but has no regular exercise apart from work Smoker: no   Past medical history, past surgical history, family history and social history were all reviewed and documented in the EPIC chart. Married. Works at [employer]. Daughter lives in [place]. Son in [employer], stationed in [place] - has 4 children, youngest is 2 months old. 8 grandchildren ages 74-20. Maternal grandmother with history of ovarian cancer.   ROS:  A ROS was performed and pertinent positives and negatives are included.  Exam:  Vitals:   11/12/23 1156  BP: 124/76  Pulse: 84  SpO2: 99%  Weight: 172 lb (78 kg)  Height: 5' 3.25" (1.607 m)      Body mass index is 30.23 kg/m.  General appearance:  Normal Thyroid:  Symmetrical, normal in size, without palpable masses or nodularity. Respiratory  Auscultation:  Clear without wheezing or rhonchi Cardiovascular  Auscultation:  Regular rate, without rubs, murmurs or gallops  Edema/varicosities:  Not grossly evident Abdominal  Soft,nontender, without masses, guarding or  rebound.  Liver/spleen:  No organomegaly noted  Hernia:  None appreciated  Skin  Inspection:  Grossly normal   Breasts: Examined lying and sitting.   Right: Without masses, retractions, discharge or axillary adenopathy.   Left: Without masses, retractions, discharge or axillary adenopathy. Pelvic: External genitalia:  no lesions              Urethra:  normal appearing urethra with no masses, tenderness or lesions              Bartholins and Skenes: normal                 Vagina: normal appearing vagina with normal color and discharge, no lesions              Cervix: no lesions Bimanual Exam:  Uterus:  no masses or tenderness              Adnexa: no mass, fullness, tenderness              Rectovaginal: Deferred              Anus:  normal, no lesions  Patient informed chaperone available to be present for breast and pelvic exam. Patient has requested no chaperone to be present. Patient has been advised what will be completed during breast and pelvic exam.   Assessment/Plan:  58 y.o. [id number] for annual exam.   Well female exam with routine gynecological exam - Education provided on SBEs, importance of preventative screenings, current guidelines, high calcium diet, regular exercise, and multivitamin daily.  Labs with PCP.   Menorrhagia with regular cycle - Plan: levonorgestrel-ethinyl estradiol (ALESSE) 0.1-20 MG-MCG tablet daily. Takes continuously x 2 months.  Menstrual migraine without status migrainosus,  not intractable - Plan: levonorgestrel-ethinyl estradiol (ALESSE) 0.1-20 MG-MCG tablet daily.   Screening for cervical cancer - Normal Pap history. Will repeat at 5-year interval per guidelines.   Screening for breast cancer - Normal mammogram history.  Continue annual screenings.  Normal breast exam today.  Screening for colon cancer - 2022 colonoscopy. Will repeat at GI's recommended interval.   Return in about 1 year (around 11/11/2024) for Annual.     Katie [name]  [facility], 12:12 PM 11/12/2023

## 2023-11-21 ENCOUNTER — Other Ambulatory Visit: Payer: Self-pay | Admitting: Family Medicine

## 2023-11-21 DIAGNOSIS — E785 Hyperlipidemia, unspecified: Secondary | ICD-10-CM

## 2023-12-15 ENCOUNTER — Other Ambulatory Visit (HOSPITAL_COMMUNITY): Payer: Self-pay | Admitting: Psychiatry

## 2023-12-17 ENCOUNTER — Other Ambulatory Visit (HOSPITAL_COMMUNITY): Payer: Self-pay | Admitting: Psychiatry

## 2023-12-17 DIAGNOSIS — F411 Generalized anxiety disorder: Secondary | ICD-10-CM

## 2024-01-15 DIAGNOSIS — Z1231 Encounter for screening mammogram for malignant neoplasm of breast: Secondary | ICD-10-CM | POA: Diagnosis not present

## 2024-01-15 LAB — HM MAMMOGRAPHY

## 2024-01-20 ENCOUNTER — Encounter: Payer: Self-pay | Admitting: Family Medicine

## 2024-02-01 ENCOUNTER — Other Ambulatory Visit: Payer: Self-pay | Admitting: Family Medicine

## 2024-02-01 DIAGNOSIS — E785 Hyperlipidemia, unspecified: Secondary | ICD-10-CM

## 2024-02-06 ENCOUNTER — Other Ambulatory Visit: Payer: Self-pay | Admitting: Family Medicine

## 2024-02-06 DIAGNOSIS — F32A Depression, unspecified: Secondary | ICD-10-CM

## 2024-02-15 ENCOUNTER — Other Ambulatory Visit (HOSPITAL_COMMUNITY): Payer: Self-pay | Admitting: Psychiatry

## 2024-02-15 DIAGNOSIS — F411 Generalized anxiety disorder: Secondary | ICD-10-CM

## 2024-03-02 ENCOUNTER — Other Ambulatory Visit: Payer: Self-pay | Admitting: Family Medicine

## 2024-03-02 DIAGNOSIS — I10 Essential (primary) hypertension: Secondary | ICD-10-CM

## 2024-03-02 DIAGNOSIS — E039 Hypothyroidism, unspecified: Secondary | ICD-10-CM

## 2024-03-05 ENCOUNTER — Other Ambulatory Visit: Payer: Self-pay | Admitting: Family Medicine

## 2024-03-05 DIAGNOSIS — F32A Depression, unspecified: Secondary | ICD-10-CM

## 2024-03-05 DIAGNOSIS — E785 Hyperlipidemia, unspecified: Secondary | ICD-10-CM

## 2024-03-15 ENCOUNTER — Other Ambulatory Visit (HOSPITAL_COMMUNITY): Payer: Self-pay | Admitting: Psychiatry

## 2024-03-29 ENCOUNTER — Telehealth (INDEPENDENT_AMBULATORY_CARE_PROVIDER_SITE_OTHER): Admitting: Psychiatry

## 2024-03-29 ENCOUNTER — Encounter (HOSPITAL_COMMUNITY): Payer: Self-pay | Admitting: Psychiatry

## 2024-03-29 ENCOUNTER — Ambulatory Visit (HOSPITAL_COMMUNITY): Payer: BC Managed Care – PPO | Admitting: Psychiatry

## 2024-03-29 DIAGNOSIS — F0630 Mood disorder due to known physiological condition, unspecified: Secondary | ICD-10-CM | POA: Diagnosis not present

## 2024-03-29 DIAGNOSIS — F411 Generalized anxiety disorder: Secondary | ICD-10-CM | POA: Diagnosis not present

## 2024-03-29 DIAGNOSIS — F5102 Adjustment insomnia: Secondary | ICD-10-CM

## 2024-03-29 MED ORDER — ALPRAZOLAM 0.25 MG PO TABS: 0.2500 mg | ORAL_TABLET | Freq: Every day | ORAL | 1 refills | Status: DC | PRN

## 2024-03-29 NOTE — Progress Notes (Signed)
 [facility] Follow up visit  Patient Identification: Katie [name] MRN:  [id number] Date of Evaluation:  03/29/2024 Referral Source: primary care Chief Complaint:  follow up insomnia     Visit Diagnosis:    ICD-10-CM   1. Mood disorder in conditions classified elsewhere  F06.30     2. GAD (generalized anxiety disorder)  F41.1 ALPRAZolam  (XANAX ) 0.25 MG tablet    3. Adjustment insomnia  F51.02      Virtual Visit via Video Note  I connected with Katie [name] on 03/29/24 at  1:30 PM EDT by a video enabled telemedicine application and verified that I am speaking with the correct person using two identifiers.  Location: Patient: home Provider: office   I discussed the limitations of evaluation and management by telemedicine and the availability of in person appointments. The patient expressed understanding and agreed to proceed.     I discussed the assessment and treatment plan with the patient. The patient was provided an opportunity to ask questions and all were answered. The patient agreed with the plan and demonstrated an understanding of the instructions.   The patient was advised to call back or seek an in-person evaluation if the symptoms worsen or if the condition fails to improve as anticipated.  I provided 15  minutes of non-face-to-face time during this encounter.   History of Present Illness: Patient is a 58 years old currently married Caucasian female  initially referred by primary care physician for management of anxiety, insomnia patient works full-time with a company and scheduling with direct mail she has 2 grown boys currently she is living with her husband  On eval doing stable, ambien  helps with sleep No reported side effect on pristiq  Visited son and grandkids  Xanax  during weekdays of work for anxiety , small dose  Aggravating factors;job can be,   distant relationship with son Modifying factors ;son  Duration since young age Severity stable She was seen at  age 8 by her counselor after her father's death 1 time no medication given       Past Psychiatric History: insomnia, anxiety  Previous Psychotropic Medications: Yes  Zoloft, seroquel    Past Medical History:  Past Medical History:  Diagnosis Date   Allergy    Anemia    Anxiety    Blood transfusion without reported diagnosis    Hyperlipidemia    Hypertension    Migraine    PVC (premature ventricular contraction)    RLS (restless legs syndrome)    Thyroid  disease     Past Surgical History:  Procedure Laterality Date   BUNIONECTOMY  06/04/2009   Right foot   CARPAL TUNNEL RELEASE     Bilateral   CRYOABLATION  11/04   ECTOPIC PREGNANCY SURGERY  1998   ENDOMETRIAL ABLATION     SEPTOPLASTY  6/05   TUBAL LIGATION  04/1999   UPPER GASTROINTESTINAL ENDOSCOPY      Family Psychiatric History: Dad: committed suicide, see chart , Niece; depression  Family History:  Family History  Problem Relation Age of Onset   Depression Father        Suicide   Aneurysm Mother        Brain   Hypertension Sister    Thyroid  disease Sister    Diabetes Maternal Grandmother    Alzheimer's disease Maternal Grandmother    Uterine cancer Maternal Grandmother    Hypertension Maternal Grandmother    Osteoporosis Maternal Grandmother    Cancer Maternal Grandmother        uterine  Lung cancer Maternal Grandfather    Cancer Maternal Grandfather        lung   Cancer Paternal Grandmother        metatstatic-- ? primary colon   Cancer Paternal Grandfather        skin, hepatic   Colon cancer Paternal Grandfather        age unknown   Colon polyps Neg Hx    Rectal cancer Neg Hx    Stomach cancer Neg Hx    Esophageal cancer Neg Hx     Social History:   Social History   Socioeconomic History   Marital status: Married    Spouse name: Not on file   Number of children: 2   Years of education: 15   Highest education level: Some college, no degree  Occupational History   Occupation:  Systems developer [employer]  Tobacco Use   Smoking status: Former    Current packs/day: 0.00    Types: Cigarettes    Quit date: 01/14/1995    Years since quitting: 29.2   Smokeless tobacco: Never   Tobacco comments:    started smoking at age 48 1ppd  Vaping Use   Vaping status: Never Used  Substance and Sexual Activity   Alcohol use: Not Currently    Alcohol/week: 2.0 standard drinks of alcohol    Types: 2 Cans of beer per week    Comment: Reports a couple of beers 1- 2 times a month   Drug use: No   Sexual activity: Not Currently    Partners: Male    Comment: 1st intercourse 58 yo-More than 5 partners-BTL  Other Topics Concern   Not on file  Social History Narrative   Exercise-- no   Social Drivers of Health   Financial Resource Strain: Patient Declined (04/30/2023)   Overall Financial Resource Strain (CARDIA)    Difficulty of Paying Living Expenses: Patient declined  Food Insecurity: No Food Insecurity (04/30/2023)   Hunger Vital Sign    Worried About Running Out of Food in the Last Year: Never true    Ran Out of Food in the Last Year: Never true  Transportation Needs: No Transportation Needs (04/30/2023)   PRAPARE - Administrator, Civil Service (Medical): No    Lack of Transportation (Non-Medical): No  Physical Activity: Unknown (04/30/2023)   Exercise Vital Sign    Days of Exercise per Week: Patient declined    Minutes of Exercise per Session: Not on file  Stress: Patient Declined (04/30/2023)   [employer] of Occupational Health - Occupational Stress Questionnaire    Feeling of Stress : Patient declined  Social Connections: Unknown (04/30/2023)   Social Connection and Isolation Panel    Frequency of Communication with Friends and Family: Patient declined    Frequency of Social Gatherings with Friends and Family: Patient declined    Attends Religious Services: Never    Database administrator or Organizations: No    Attends Engineer, structural: Not  on file    Marital Status: Married     Allergies:   Allergies  Allergen Reactions   Morphine     Nausea & vomiting   Lisinopril      sweating    Metabolic Disorder Labs: Lab Results  Component Value Date   HGBA1C 7.2 (H) 09/01/2023   No results found for: PROLACTIN Lab Results  Component Value Date   CHOL 169 09/01/2023   TRIG 143.0 09/01/2023   HDL 48.70 09/01/2023   CHOLHDL  3 09/01/2023   VLDL 28.6 09/01/2023   LDLCALC 92 09/01/2023   LDLCALC 66 04/30/2023   Lab Results  Component Value Date   TSH 0.44 09/01/2023    Therapeutic Level Labs: No results found for: LITHIUM No results found for: CBMZ No results found for: VALPROATE  Current Medications: Current Outpatient Medications  Medication Sig Dispense Refill   ALPRAZolam  (XANAX ) 0.25 MG tablet Take 1 tablet (0.25 mg total) by mouth daily as needed. for anxiety 30 tablet 1   amLODipine  (NORVASC ) 5 MG tablet TAKE 1 TABLET BY MOUTH DAILY 90 tablet 1   cetirizine  (ZYRTEC ) 10 MG tablet Take 1 tablet (10 mg total) by mouth daily.     desvenlafaxine  (PRISTIQ ) 100 MG 24 hr tablet TAKE 1 TABLET BY MOUTH DAILY 30 tablet 2   fenofibrate  160 MG tablet TAKE 1 TABLET BY MOUTH DAILY 90 tablet 1   hydrochlorothiazide  (HYDRODIURIL ) 25 MG tablet Take 1 tablet (25 mg total) by mouth daily. 90 tablet 3   lansoprazole  (PREVACID ) 30 MG capsule TAKE 1 CAPSULE BY MOUTH DAILY BEFORE BREAKFAST 30 capsule 3   levonorgestrel -ethinyl estradiol  (VIENVA) 0.1-20 MG-MCG tablet Take 1 tablet by mouth daily. 84 tablet 3   levothyroxine  (SYNTHROID ) 150 MCG tablet TAKE 1 TABLET BY MOUTH DAILY BEFORE BREAKFAST 90 tablet 1   metFORMIN  (GLUCOPHAGE -XR) 500 MG 24 hr tablet Take 1 tablet (500 mg total) by mouth daily with breakfast. (Patient not taking: Reported on 11/12/2023) 90 tablet 0   rosuvastatin  (CRESTOR ) 10 MG tablet TAKE 1 TABLET BY MOUTH DAILY 30 tablet 2   zolpidem  (AMBIEN ) 10 MG tablet TAKE 1 TABLET BY MOUTH AT BEDTIME AS NEEDED FOR  SLEEP 30 tablet 2   No current facility-administered medications for this visit.      Psychiatric Specialty Exam: Review of Systems  Cardiovascular:  Negative for chest pain and palpitations.  Neurological:  Negative for tremors.  Psychiatric/Behavioral:  Negative for self-injury and suicidal ideas.     There were no vitals taken for this visit.There is no height or weight on file to calculate BMI.  General Appearance: Casual  Eye Contact:  Fair  Speech:  Clear and Coherent  Volume:  Normal  Mood:  fair   Affect:  Full Range  Thought Process:  Goal Directed  Orientation:  Full (Time, Place, and Person)  Thought Content:  Rumination  Suicidal Thoughts:  No  Homicidal Thoughts:  No  Memory:  Immediate;   Fair Recent;   Fair  Judgement:  Fair  Insight:  Fair  Psychomotor Activity:  Normal  Concentration:  Concentration: Fair and Attention Span: Fair  Recall:  [employer] of Knowledge:Good  Language: Good  Akathisia:  No  Handed:    AIMS (if indicated):  not done  Assets:  Desire for Improvement Financial Resources/Insurance Physical Health Social Support  ADL's:  Intact  Cognition: WNL  Sleep:  Poor   Screenings: AUDIT    Flowsheet Row Office Visit from 04/30/2023 in [facility] at [facility]  Alcohol Use Disorder Identification Test Final Score (AUDIT) 3    PHQ2-9    Flowsheet Row Office Visit from 11/12/2023 in [facility] [place] Office Visit from 05/28/2023 in [facility] at [facility] Office Visit from 01/09/2022 in [facility] Primary Care at [facility] Office Visit from 12/13/2020 in [facility] at [facility] Office Visit from 10/19/2020 in [facility] Primary  Care at [facility]  PHQ-2 Total Score 0 0 0 0 0   Flowsheet Row Office Visit from 05/22/2022 in [facility] at  [facility] Office Visit from 01/07/2022 in [facility] Health at [facility] Office Visit from 09/12/2021 in [facility] at [facility]  C-SSRS RISK CATEGORY No Risk No Risk No Risk    Assessment and Plan: as follows  Prior documentation reviewed  Mood disorder not otherwise specified;  stable continue pristiq   Insomnia; continue sleep hygiene and still needs ambien  regularly for sleep  Generalized anxiety disorder; doing fair continjue pristiq , xanax  FU 6 m or earlier if needed   Fu 85m.  Jackey Flight, MD 8/5/20251:39 PM

## 2024-04-07 ENCOUNTER — Other Ambulatory Visit

## 2024-04-12 ENCOUNTER — Encounter: Payer: Self-pay | Admitting: Family Medicine

## 2024-04-12 ENCOUNTER — Ambulatory Visit: Admitting: Family Medicine

## 2024-04-12 VITALS — BP 114/84 | HR 88 | Temp 98.4°F | Resp 18 | Ht 63.25 in | Wt 165.8 lb

## 2024-04-12 DIAGNOSIS — F419 Anxiety disorder, unspecified: Secondary | ICD-10-CM

## 2024-04-12 DIAGNOSIS — E1165 Type 2 diabetes mellitus with hyperglycemia: Secondary | ICD-10-CM | POA: Diagnosis not present

## 2024-04-12 DIAGNOSIS — R739 Hyperglycemia, unspecified: Secondary | ICD-10-CM

## 2024-04-12 DIAGNOSIS — E785 Hyperlipidemia, unspecified: Secondary | ICD-10-CM

## 2024-04-12 DIAGNOSIS — M549 Dorsalgia, unspecified: Secondary | ICD-10-CM | POA: Diagnosis not present

## 2024-04-12 DIAGNOSIS — E039 Hypothyroidism, unspecified: Secondary | ICD-10-CM

## 2024-04-12 DIAGNOSIS — J309 Allergic rhinitis, unspecified: Secondary | ICD-10-CM

## 2024-04-12 LAB — CBC WITH DIFFERENTIAL/PLATELET
Basophils Absolute: 0.1 K/uL (ref 0.0–0.1)
Basophils Relative: 0.9 % (ref 0.0–3.0)
Eosinophils Absolute: 0.2 K/uL (ref 0.0–0.7)
Eosinophils Relative: 3.3 % (ref 0.0–5.0)
HCT: 40.2 % (ref 36.0–46.0)
Hemoglobin: 12.9 g/dL (ref 12.0–15.0)
Lymphocytes Relative: 31.9 % (ref 12.0–46.0)
Lymphs Abs: 1.8 K/uL (ref 0.7–4.0)
MCHC: 32 g/dL (ref 30.0–36.0)
MCV: 72 fl — ABNORMAL LOW (ref 78.0–100.0)
Monocytes Absolute: 0.4 K/uL (ref 0.1–1.0)
Monocytes Relative: 6.7 % (ref 3.0–12.0)
Neutro Abs: 3.2 K/uL (ref 1.4–7.7)
Neutrophils Relative %: 57.2 % (ref 43.0–77.0)
Platelets: 412 K/uL — ABNORMAL HIGH (ref 150.0–400.0)
RBC: 5.58 Mil/uL — ABNORMAL HIGH (ref 3.87–5.11)
RDW: 16.1 % — ABNORMAL HIGH (ref 11.5–15.5)
WBC: 5.7 K/uL (ref 4.0–10.5)

## 2024-04-12 LAB — COMPREHENSIVE METABOLIC PANEL WITH GFR
ALT: 16 U/L (ref 0–35)
AST: 17 U/L (ref 0–37)
Albumin: 4.6 g/dL (ref 3.5–5.2)
Alkaline Phosphatase: 53 U/L (ref 39–117)
BUN: 20 mg/dL (ref 6–23)
CO2: 29 meq/L (ref 19–32)
Calcium: 10 mg/dL (ref 8.4–10.5)
Chloride: 98 meq/L (ref 96–112)
Creatinine, Ser: 0.9 mg/dL (ref 0.40–1.20)
GFR: 71.74 mL/min (ref >60.00)
Glucose, Bld: 108 mg/dL — ABNORMAL HIGH (ref 70–99)
Potassium: 4.7 meq/L (ref 3.5–5.1)
Sodium: 136 meq/L (ref 135–145)
Total Bilirubin: 0.5 mg/dL (ref 0.2–1.2)
Total Protein: 7.8 g/dL (ref 6.0–8.3)

## 2024-04-12 LAB — LIPID PANEL
Cholesterol: 150 mg/dL (ref 0–200)
HDL: 51.8 mg/dL (ref >39.00)
LDL Cholesterol: 68 mg/dL (ref 0–99)
NonHDL: 98.52
Total CHOL/HDL Ratio: 3
Triglycerides: 151 mg/dL — ABNORMAL HIGH (ref 0.0–149.0)
VLDL: 30.2 mg/dL (ref 0.0–40.0)

## 2024-04-12 LAB — MICROALBUMIN / CREATININE URINE RATIO
Creatinine,U: 98.6 mg/dL
Microalb Creat Ratio: 16.3 mg/g (ref 0.0–30.0)
Microalb, Ur: 1.6 mg/dL (ref 0.0–1.9)

## 2024-04-12 LAB — HEMOGLOBIN A1C: Hgb A1c MFr Bld: 6.9 % — ABNORMAL HIGH (ref 4.6–6.5)

## 2024-04-12 LAB — TSH: TSH: 0.13 u[IU]/mL — ABNORMAL LOW (ref 0.35–5.50)

## 2024-04-12 NOTE — Assessment & Plan Note (Signed)
 Check labs

## 2024-04-12 NOTE — Assessment & Plan Note (Signed)
 hgba1c to be checked, minimize simple carbs. Increase exercise as tolerated. Continue current meds

## 2024-04-12 NOTE — Assessment & Plan Note (Signed)
 SABRA
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
## 2024-04-12 NOTE — Assessment & Plan Note (Signed)
 Encourage heart healthy diet such as MIND or DASH diet, increase exercise, avoid trans fats, simple carbohydrates and processed foods, consider a krill or fish or flaxseed oil cap daily.

## 2024-04-12 NOTE — Patient Instructions (Signed)

## 2024-04-18 ENCOUNTER — Encounter: Payer: Self-pay | Admitting: Family Medicine

## 2024-04-18 ENCOUNTER — Other Ambulatory Visit: Payer: Self-pay | Admitting: Family Medicine

## 2024-04-18 DIAGNOSIS — M549 Dorsalgia, unspecified: Secondary | ICD-10-CM

## 2024-04-21 ENCOUNTER — Ambulatory Visit (HOSPITAL_BASED_OUTPATIENT_CLINIC_OR_DEPARTMENT_OTHER)
Admission: RE | Admit: 2024-04-21 | Discharge: 2024-04-21 | Disposition: A | Discharge status: Home / Self Care | Source: Ambulatory Visit | Attending: Family Medicine | Admitting: Family Medicine

## 2024-04-21 DIAGNOSIS — M5136 Other intervertebral disc degeneration, lumbar region with discogenic back pain only: Secondary | ICD-10-CM | POA: Diagnosis not present

## 2024-04-21 DIAGNOSIS — M549 Dorsalgia, unspecified: Secondary | ICD-10-CM | POA: Insufficient documentation

## 2024-04-21 DIAGNOSIS — M47816 Spondylosis without myelopathy or radiculopathy, lumbar region: Secondary | ICD-10-CM | POA: Diagnosis not present

## 2024-04-21 DIAGNOSIS — M79604 Pain in right leg: Secondary | ICD-10-CM | POA: Diagnosis not present

## 2024-04-24 ENCOUNTER — Ambulatory Visit: Payer: Self-pay | Admitting: Family Medicine

## 2024-04-24 ENCOUNTER — Other Ambulatory Visit: Payer: Self-pay | Admitting: Family Medicine

## 2024-04-24 DIAGNOSIS — E039 Hypothyroidism, unspecified: Secondary | ICD-10-CM

## 2024-04-26 MED ORDER — LEVOTHYROXINE SODIUM 137 MCG PO TABS: 137.0000 ug | ORAL_TABLET | Freq: Every day | ORAL | 2 refills | Status: DC

## 2024-04-29 ENCOUNTER — Ambulatory Visit: Payer: Self-pay | Admitting: Family Medicine

## 2024-05-03 ENCOUNTER — Other Ambulatory Visit: Payer: Self-pay | Admitting: Family Medicine

## 2024-05-03 DIAGNOSIS — M549 Dorsalgia, unspecified: Secondary | ICD-10-CM

## 2024-05-11 ENCOUNTER — Other Ambulatory Visit: Payer: Self-pay | Admitting: Family Medicine

## 2024-05-11 DIAGNOSIS — E785 Hyperlipidemia, unspecified: Secondary | ICD-10-CM

## 2024-05-26 ENCOUNTER — Encounter: Payer: Self-pay | Admitting: Physical Medicine and Rehabilitation

## 2024-05-26 ENCOUNTER — Ambulatory Visit (INDEPENDENT_AMBULATORY_CARE_PROVIDER_SITE_OTHER): Admitting: Physical Medicine and Rehabilitation

## 2024-05-26 DIAGNOSIS — M5416 Radiculopathy, lumbar region: Secondary | ICD-10-CM

## 2024-05-26 DIAGNOSIS — M5441 Lumbago with sciatica, right side: Secondary | ICD-10-CM | POA: Diagnosis not present

## 2024-05-26 DIAGNOSIS — G8929 Other chronic pain: Secondary | ICD-10-CM

## 2024-05-26 MED ORDER — METHOCARBAMOL 500 MG PO TABS: 500.0000 mg | ORAL_TABLET | Freq: Three times a day (TID) | ORAL | 0 refills | Status: AC

## 2024-05-26 MED ORDER — MELOXICAM 15 MG PO TABS: 15.0000 mg | ORAL_TABLET | Freq: Every day | ORAL | 0 refills | Status: AC
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
## 2024-05-26 NOTE — Progress Notes (Signed)
 Pain Scale   Average Pain 8 Patient advising she has chronic lower back pain radiating to right leg, sitting increases pain and walking decreases pain.        +Driver, -BT, -Dye Allergies.

## 2024-05-31 ENCOUNTER — Other Ambulatory Visit: Payer: Self-pay | Admitting: Family Medicine

## 2024-05-31 DIAGNOSIS — E785 Hyperlipidemia, unspecified: Secondary | ICD-10-CM

## 2024-06-03 ENCOUNTER — Encounter: Payer: Self-pay | Admitting: Physical Medicine and Rehabilitation

## 2024-06-11 ENCOUNTER — Other Ambulatory Visit: Payer: Self-pay | Admitting: Family Medicine

## 2024-06-11 DIAGNOSIS — F32A Depression, unspecified: Secondary | ICD-10-CM

## 2024-06-15 ENCOUNTER — Other Ambulatory Visit (HOSPITAL_COMMUNITY): Payer: Self-pay | Admitting: Psychiatry

## 2024-06-15 ENCOUNTER — Ambulatory Visit
Admission: RE | Admit: 2024-06-15 | Discharge: 2024-06-15 | Disposition: A | Discharge status: Home / Self Care | Source: Ambulatory Visit | Attending: Physical Medicine and Rehabilitation | Admitting: Physical Medicine and Rehabilitation

## 2024-06-15 DIAGNOSIS — M47816 Spondylosis without myelopathy or radiculopathy, lumbar region: Secondary | ICD-10-CM | POA: Diagnosis not present

## 2024-06-15 DIAGNOSIS — G8929 Other chronic pain: Secondary | ICD-10-CM

## 2024-06-15 DIAGNOSIS — M5416 Radiculopathy, lumbar region: Secondary | ICD-10-CM

## 2024-06-15 DIAGNOSIS — M5126 Other intervertebral disc displacement, lumbar region: Secondary | ICD-10-CM | POA: Diagnosis not present

## 2024-06-16 ENCOUNTER — Ambulatory Visit: Payer: Self-pay

## 2024-06-16 NOTE — Telephone Encounter (Signed)
 [id number] to schedule MRI review with Megn

## 2024-06-27 ENCOUNTER — Encounter: Payer: Self-pay | Admitting: Physical Medicine and Rehabilitation

## 2024-06-27 ENCOUNTER — Encounter: Payer: Self-pay | Admitting: Radiology

## 2024-06-27 ENCOUNTER — Ambulatory Visit: Admitting: Physical Medicine and Rehabilitation

## 2024-06-27 DIAGNOSIS — M5416 Radiculopathy, lumbar region: Secondary | ICD-10-CM

## 2024-06-27 DIAGNOSIS — M48061 Spinal stenosis, lumbar region without neurogenic claudication: Secondary | ICD-10-CM | POA: Diagnosis not present

## 2024-06-27 DIAGNOSIS — R202 Paresthesia of skin: Secondary | ICD-10-CM | POA: Diagnosis not present

## 2024-06-27 DIAGNOSIS — M5441 Lumbago with sciatica, right side: Secondary | ICD-10-CM

## 2024-06-27 DIAGNOSIS — G8929 Other chronic pain: Secondary | ICD-10-CM

## 2024-06-27 MED ORDER — METHOCARBAMOL 500 MG PO TABS: 500.0000 mg | ORAL_TABLET | Freq: Three times a day (TID) | ORAL | 0 refills | Status: AC

## 2024-06-27 MED ORDER — MELOXICAM 15 MG PO TABS: 15.0000 mg | ORAL_TABLET | Freq: Every day | ORAL | 0 refills | Status: AC
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
## 2024-06-27 NOTE — Progress Notes (Signed)
 Pain Scale   Average Pain 5 Patient advising she has lower back pain radiating to both legs at times.  Patient her for MRI review        +Driver, -BT, -Dye Allergies.

## 2024-07-23 ENCOUNTER — Other Ambulatory Visit: Payer: Self-pay | Admitting: Family Medicine

## 2024-07-25 ENCOUNTER — Other Ambulatory Visit: Payer: Self-pay

## 2024-07-25 ENCOUNTER — Ambulatory Visit: Admitting: Physical Medicine and Rehabilitation

## 2024-07-25 ENCOUNTER — Other Ambulatory Visit (HOSPITAL_COMMUNITY): Payer: Self-pay | Admitting: Psychiatry

## 2024-07-25 VITALS — BP 146/88 | HR 80

## 2024-07-25 DIAGNOSIS — M5416 Radiculopathy, lumbar region: Secondary | ICD-10-CM

## 2024-07-25 DIAGNOSIS — F411 Generalized anxiety disorder: Secondary | ICD-10-CM

## 2024-07-25 MED ORDER — METHYLPREDNISOLONE ACETATE 40 MG/ML IJ SUSP
40.0000 mg | Freq: Once | INTRAMUSCULAR | Status: AC
  Administered 2024-07-25: 40 mg

## 2024-07-25 NOTE — Progress Notes (Signed)
 Katie [name] - 58 y.o. female MRN [id number]  Date of birth: Dec 20, 1965  Office Visit Note: Visit Date: 07/25/2024 PCP: Antonio Cyndee Jamee JONELLE, DO Referred by: Antonio Cyndee Jamee JONELLE, *  Subjective: Chief Complaint  Patient presents with   Lower Back - Pain   HPI:  Katie [name] is a 58 y.o. female who comes in today at the request of Duwaine Pouch, FNP for planned Right L4-5 Lumbar Interlaminar epidural steroid injection with fluoroscopic guidance.  The patient has failed conservative care including home exercise, medications, time and activity modification.  This injection will be diagnostic and hopefully therapeutic.  Please see requesting physician notes for further details and justification. She has sacralized L5 segment.   ROS Otherwise per HPI.  Assessment & Plan: Visit Diagnoses:    ICD-10-CM   1. Lumbar radiculopathy  M54.16 XR C-ARM NO REPORT    Epidural Steroid injection    methylPREDNISolone acetate (DEPO-MEDROL) injection 40 mg      Plan: No additional findings.   Meds & Orders:  Meds ordered this encounter  Medications   methylPREDNISolone acetate (DEPO-MEDROL) injection 40 mg    Orders Placed This Encounter  Procedures   XR C-ARM NO REPORT   Epidural Steroid injection    Follow-up: Return for visit to requesting provider as needed.   Procedures: No procedures performed  Lumbar Epidural Steroid Injection - Interlaminar Approach with Fluoroscopic Guidance  Patient: Katie [name]      Date of Birth: 08-15-1966 MRN: [id number] PCP: Antonio Cyndee Jamee JONELLE, DO      Visit Date: 07/25/2024   Universal Protocol:     Consent Given By: the patient  Position: PRONE  Additional Comments: Vital signs were monitored before and after the procedure. Patient was prepped and draped in the usual sterile fashion. The correct patient, procedure, and site was verified.   Injection Procedure Details:   Procedure diagnoses: Lumbar radiculopathy [M54.16]   Meds  Administered:  Meds ordered this encounter  Medications   methylPREDNISolone acetate (DEPO-MEDROL) injection 40 mg     Laterality: Right  Location/Site:  L4-5, sacralized L5 segment  Needle: 3.5 in., 20 ga. Tuohy  Needle Placement: Paramedian epidural  Findings:   -Comments: Excellent flow of contrast into the epidural space.  Procedure Details: Using a paramedian approach from the side mentioned above, the region overlying the inferior lamina was localized under fluoroscopic visualization and the soft tissues overlying this structure were infiltrated with 4 ml. of 1% Lidocaine without Epinephrine. The Tuohy needle was inserted into the epidural space using a paramedian approach.   The epidural space was localized using loss of resistance along with counter oblique bi-planar fluoroscopic views.  After negative aspirate for air, blood, and CSF, a 2 ml. volume of Isovue-250 was injected into the epidural space and the flow of contrast was observed. Radiographs were obtained for documentation purposes.    The injectate was administered into the level noted above.   Additional Comments:  No complications occurred Dressing: 2 x 2 sterile gauze and Band-Aid    Post-procedure details: Patient was observed during the procedure. Post-procedure instructions were reviewed.  Patient left the clinic in stable condition.   Clinical History: MR LUMBAR SPINE WITHOUT IV CONTRAST   COMPARISON: 06/30/2015   CLINICAL HISTORY: Low back pain. Chronic left-sided back pain and right leg pain.   TECHNIQUE: SAG T2, SAG T1, SAG STIR, AX T2, AX T1 without IV contrast.   FINDINGS: There is very mild rightward curvature of the lumbar  spine suspected. Mild/moderate facet arthrosis is present L4-5. This is relatively stable when compared with the prior examination. There are a few benign scattered hemangiomas identified. There is no vertebral body height loss, subluxation or marrow replacing  process. The sacrum and SI joints are unremarkable so far as visualized. Conus and cauda equina are unremarkable.   T12-L1: There is no focal disc protrusion, foraminal or spinal stenosis.   L1-2: There is no focal disc protrusion, foraminal or spinal stenosis. Mild facet arthrosis.   L2-3: There is no focal disc protrusion, foraminal or spinal stenosis. Mild facet arthrosis.   L3-4: Mild disc desiccation without focal extrusion, foraminal or spinal stenosis. Mild facet arthrosis.   L4-5: Minimal broad-based bulge is relatively stable slightly effacing the ventral thecal sac. There is mild-to-moderate facet arthrosis. There is no impingement of the descending nerve roots identified. There is caudal foraminal narrowing bilaterally which may have slightly progressed. Correlation for L4 radicular symptoms.   L5-S1: There is no focal disc protrusion, foraminal or spinal stenosis. The lowest rectangular vertebral body is labeled L5. There is sacralization of the L5 vertebral body.   The retroperitoneal structures demonstrate no significant abnormality.   IMPRESSION: Transitional level is present. The lowest rectangular vertebral body is labeled L5. There is sacralization of the L5 vertebral body.   Mild broad-based disc osteophyte is seen at L4-5 and bilateral foraminal narrowing and mild spinal stenosis. Findings have not significantly progressed compared with the prior examination.   Electronically signed by: Norleen Satchel MD 06/16/2024 03:19 PM EDT RP Workstation: [id number]     Objective:  VS:  HT:    WT:   BMI:     BP:(!) 146/88  HR:80bpm  TEMP: ( )  RESP:  Physical Exam Vitals and nursing note reviewed.  Constitutional:      General: She is not in acute distress.    Appearance: Normal appearance. She is not ill-appearing.  HENT:     Head: Normocephalic and atraumatic.     Right Ear: External ear normal.     Left Ear: External ear normal.  Eyes:     Extraocular  Movements: Extraocular movements intact.  Cardiovascular:     Rate and Rhythm: Normal rate.     Pulses: Normal pulses.  Pulmonary:     Effort: Pulmonary effort is normal. No respiratory distress.  Abdominal:     General: There is no distension.     Palpations: Abdomen is soft.  Musculoskeletal:        General: Tenderness present.     Cervical back: Neck supple.     Right lower leg: No edema.     Left lower leg: No edema.     Comments: Patient has good distal strength with no pain over the greater trochanters.  No clonus or focal weakness.  Skin:    Findings: No erythema, lesion or rash.  Neurological:     General: No focal deficit present.     Mental Status: She is alert and oriented to person, place, and time.     Sensory: No sensory deficit.     Motor: No weakness or abnormal muscle tone.     Coordination: Coordination normal.  Psychiatric:        Mood and Affect: Mood normal.        Behavior: Behavior normal.      Imaging: No results found.

## 2024-07-25 NOTE — Procedures (Signed)
 Lumbar Epidural Steroid Injection - Interlaminar Approach with Fluoroscopic Guidance  Patient: Katie [name]      Date of Birth: [birth year] MRN: [id number] PCP: Antonio Cyndee Jamee JONELLE, DO      Visit Date: 07/25/2024   Universal Protocol:     Consent Given By: the patient  Position: PRONE  Additional Comments: Vital signs were monitored before and after the procedure. Patient was prepped and draped in the usual sterile fashion. The correct patient, procedure, and site was verified.   Injection Procedure Details:   Procedure diagnoses: Lumbar radiculopathy [M54.16]   Meds Administered:  Meds ordered this encounter  Medications   methylPREDNISolone acetate (DEPO-MEDROL) injection 40 mg     Laterality: Right  Location/Site:  L4-5, sacralized L5 segment  Needle: 3.5 in., 20 ga. Tuohy  Needle Placement: Paramedian epidural  Findings:   -Comments: Excellent flow of contrast into the epidural space.  Procedure Details: Using a paramedian approach from the side mentioned above, the region overlying the inferior lamina was localized under fluoroscopic visualization and the soft tissues overlying this structure were infiltrated with 4 ml. of 1% Lidocaine without Epinephrine. The Tuohy needle was inserted into the epidural space using a paramedian approach.   The epidural space was localized using loss of resistance along with counter oblique bi-planar fluoroscopic views.  After negative aspirate for air, blood, and CSF, a 2 ml. volume of Isovue-250 was injected into the epidural space and the flow of contrast was observed. Radiographs were obtained for documentation purposes.    The injectate was administered into the level noted above.   Additional Comments:  No complications occurred Dressing: 2 x 2 sterile gauze and Band-Aid    Post-procedure details: Patient was observed during the procedure. Post-procedure instructions were reviewed.  Patient left the clinic in stable  condition.

## 2024-07-25 NOTE — Progress Notes (Signed)
 Pain Scale   Average Pain 3 Patient advising she has chronic lower back pain that increases when sitting and decreases when walking        +Driver, -BT, -Dye Allergies.

## 2024-08-31 ENCOUNTER — Other Ambulatory Visit: Payer: Self-pay

## 2024-08-31 DIAGNOSIS — E785 Hyperlipidemia, unspecified: Secondary | ICD-10-CM

## 2024-08-31 MED ORDER — LEVOTHYROXINE SODIUM 137 MCG PO TABS: 137.0000 ug | ORAL_TABLET | Freq: Every day | ORAL | 0 refills | Status: AC

## 2024-09-08 ENCOUNTER — Other Ambulatory Visit (HOSPITAL_COMMUNITY): Payer: Self-pay | Admitting: Psychiatry

## 2024-09-09 MED ORDER — ROSUVASTATIN CALCIUM 10 MG PO TABS: 10.0000 mg | ORAL_TABLET | Freq: Every day | ORAL | 2 refills | Status: AC

## 2024-09-09 NOTE — Addendum Note (Signed)
 Addended by: Brian Zeitlin D on: 09/09/2024 10:15 AM   Modules accepted: Orders

## 2024-09-18 ENCOUNTER — Other Ambulatory Visit: Payer: Self-pay | Admitting: Nurse Practitioner

## 2024-09-18 DIAGNOSIS — G43829 Menstrual migraine, not intractable, without status migrainosus: Secondary | ICD-10-CM

## 2024-09-18 DIAGNOSIS — N920 Excessive and frequent menstruation with regular cycle: Secondary | ICD-10-CM

## 2024-09-20 ENCOUNTER — Encounter: Payer: Self-pay | Admitting: Nurse Practitioner

## 2024-09-20 NOTE — Telephone Encounter (Signed)
 Med refill request:   levonorgestrel -ethinyl estradiol  (VIENVA) 0.1-20 MG-MCG tablet  Start:  11/12/23 Disp: 84  tablets Refills:  3  Last AEX:  11/12/23 Next AEX:  Not yet scheduled Last MMG (if hormonal med):  12/01/23 Refill authorized? Please Advise.

## 2024-09-28 ENCOUNTER — Telehealth (HOSPITAL_COMMUNITY): Admitting: Psychiatry

## 2024-09-28 ENCOUNTER — Encounter (HOSPITAL_COMMUNITY): Payer: Self-pay | Admitting: Psychiatry

## 2024-09-28 DIAGNOSIS — F411 Generalized anxiety disorder: Secondary | ICD-10-CM

## 2024-09-28 DIAGNOSIS — F5102 Adjustment insomnia: Secondary | ICD-10-CM

## 2024-09-28 DIAGNOSIS — F0630 Mood disorder due to known physiological condition, unspecified: Secondary | ICD-10-CM

## 2024-09-28 MED ORDER — ALPRAZOLAM 0.25 MG PO TABS: 0.2500 mg | ORAL_TABLET | Freq: Every day | ORAL | 1 refills | Status: AC | PRN

## 2024-09-28 NOTE — Progress Notes (Signed)
 " [facility] Follow up visit  Patient Identification: Katie [name] MRN:  [id number] Date of Evaluation:  09/28/2024 Referral Source: primary care Chief Complaint:  follow up insomnia     Visit Diagnosis:    ICD-10-CM   1. GAD (generalized anxiety disorder)  F41.1 ALPRAZolam  (XANAX ) 0.25 MG tablet    2. Mood disorder in conditions classified elsewhere  F06.30     3. Adjustment insomnia  F51.02      Virtual Visit via Video Note  I connected with Eleanor Rase on 09/28/24 at  1:30 PM EST by a video enabled telemedicine application and verified that I am speaking with the correct person using two identifiers.  Location: Patient: work Provider: home office   I discussed the limitations of evaluation and management by telemedicine and the availability of in person appointments. The patient expressed understanding and agreed to proceed.   I discussed the assessment and treatment plan with the patient. The patient was provided an opportunity to ask questions and all were answered. The patient agreed with the plan and demonstrated an understanding of the instructions.   The patient was advised to call back or seek an in-person evaluation if the symptoms worsen or if the condition fails to improve as anticipated.  I provided 18 minutes of non-face-to-face time during this encounter.       History of Present Illness: Patient is a 59 years old currently married Caucasian female  initially referred by primary care physician for management of anxiety, insomnia patient works full-time with a company and scheduling with direct mail she has 2 grown boys currently she is living with her husband  On evaluation patient is doing stable tolerating medication sleeps reasonably with Ambien  she is also on Pristiq    Has a good supportive husband job stress is manageable as well  Xanax  during weekdays of work for anxiety , small dose  Aggravating factors;job can be,   distant relationship with  son Modifying factors ; son  Duration since young age Severity stable        Past Psychiatric History: insomnia, anxiety  Previous Psychotropic Medications: Yes  Zoloft, seroquel    Past Medical History:  Past Medical History:  Diagnosis Date   Allergy    Anemia    Anxiety    Blood transfusion without reported diagnosis    Hyperlipidemia    Hypertension    Migraine    PVC (premature ventricular contraction)    RLS (restless legs syndrome)    Thyroid  disease     Past Surgical History:  Procedure Laterality Date   BUNIONECTOMY  06/04/2009   Right foot   CARPAL TUNNEL RELEASE     Bilateral   CRYOABLATION  11/04   ECTOPIC PREGNANCY SURGERY  1998   ENDOMETRIAL ABLATION     SEPTOPLASTY  6/05   TUBAL LIGATION  04/1999   UPPER GASTROINTESTINAL ENDOSCOPY      Family Psychiatric History: Dad: committed suicide, see chart , Niece; depression  Family History:  Family History  Problem Relation Age of Onset   Depression Father        Suicide   Aneurysm Mother        Brain   Hypertension Sister    Thyroid  disease Sister    Diabetes Maternal Grandmother    Alzheimer's disease Maternal Grandmother    Uterine cancer Maternal Grandmother    Hypertension Maternal Grandmother    Osteoporosis Maternal Grandmother    Cancer Maternal Grandmother        uterine  Lung cancer Maternal Grandfather    Cancer Maternal Grandfather        lung   Cancer Paternal Grandmother        metatstatic-- ? primary colon   Cancer Paternal Grandfather        skin, hepatic   Colon cancer Paternal Grandfather        age unknown   Colon polyps Neg Hx    Rectal cancer Neg Hx    Stomach cancer Neg Hx    Esophageal cancer Neg Hx     Social History:   Social History   Socioeconomic History   Marital status: Married    Spouse name: Not on file   Number of children: 2   Years of education: 15   Highest education level: Some college, no degree  Occupational History   Occupation:  systems developer [employer]  Tobacco Use   Smoking status: Former    Current packs/day: 0.00    Average packs/day: 1.0 packs/day    Types: Cigarettes    Quit date: 01/14/1995    Years since quitting: 29.7   Smokeless tobacco: Never   Tobacco comments:    started smoking at age 55 1ppd  Vaping Use   Vaping status: Never Used  Substance and Sexual Activity   Alcohol use: Not Currently    Alcohol/week: 2.0 standard drinks of alcohol    Types: 2 Cans of beer per week    Comment: Reports a couple of beers 1- 2 times a month   Drug use: No   Sexual activity: Not Currently    Partners: Male    Comment: 1st intercourse 59 yo-More than 5 partners-BTL  Other Topics Concern   Not on file  Social History Narrative   Exercise-- no   Social Drivers of Health   Tobacco Use: Medium Risk (06/27/2024)   Patient History    Smoking Tobacco Use: Former    Smokeless Tobacco Use: Never    Passive Exposure: Not on file  Financial Resource Strain: Patient Declined (04/11/2024)   Overall Financial Resource Strain (CARDIA)    Difficulty of Paying Living Expenses: Patient declined  Food Insecurity: No Food Insecurity (04/11/2024)   Epic    Worried About Programme Researcher, Broadcasting/film/video in the Last Year: Never true    Ran Out of Food in the Last Year: Never true  Transportation Needs: No Transportation Needs (04/11/2024)   Epic    Lack of Transportation (Medical): No    Lack of Transportation (Non-Medical): No  Physical Activity: Unknown (04/11/2024)   Exercise Vital Sign    Days of Exercise per Week: Patient declined    Minutes of Exercise per Session: Not on file  Stress: Patient Declined (04/11/2024)   [employer] of Occupational Health - Occupational Stress Questionnaire    Feeling of Stress: Patient declined  Social Connections: Unknown (04/11/2024)   Social Connection and Isolation Panel    Frequency of Communication with Friends and Family: Once a week    Frequency of Social Gatherings with Friends  and Family: Patient declined    Attends Religious Services: Never    Database Administrator or Organizations: No    Attends Banker Meetings: Not on file    Marital Status: Married  Depression (PHQ2-9): Low Risk (11/12/2023)   Depression (PHQ2-9)    PHQ-2 Score: 0  Alcohol Screen: Low Risk (04/11/2024)   Alcohol Screen    Last Alcohol Screening Score (AUDIT): 1  Housing: Unknown (04/11/2024)   Epic  Unable to Pay for Housing in the Last Year: Patient declined    Number of Times Moved in the Last Year: Not on file    Homeless in the Last Year: No  Utilities: Not on file  Health Literacy: Not on file     Allergies:   Allergies  Allergen Reactions   Morphine     Nausea & vomiting   Lisinopril      sweating    Metabolic Disorder Labs: Lab Results  Component Value Date   HGBA1C 6.9 (H) 04/12/2024   No results found for: PROLACTIN Lab Results  Component Value Date   CHOL 150 04/12/2024   TRIG 151.0 (H) 04/12/2024   HDL 51.80 04/12/2024   CHOLHDL 3 04/12/2024   VLDL 30.2 04/12/2024   LDLCALC 68 04/12/2024   LDLCALC 92 09/01/2023   Lab Results  Component Value Date   TSH 0.13 (L) 04/12/2024    Therapeutic Level Labs: No results found for: LITHIUM No results found for: CBMZ No results found for: VALPROATE  Current Medications: Current Outpatient Medications  Medication Sig Dispense Refill   ALPRAZolam  (XANAX ) 0.25 MG tablet Take 1 tablet (0.25 mg total) by mouth daily as needed. for anxiety 30 tablet 1   amLODipine  (NORVASC ) 5 MG tablet TAKE 1 TABLET BY MOUTH DAILY 90 tablet 1   cetirizine  (ZYRTEC ) 10 MG tablet Take 1 tablet (10 mg total) by mouth daily.     desvenlafaxine  (PRISTIQ ) 100 MG 24 hr tablet Take 1 tablet (100 mg total) by mouth daily. 90 tablet 1   fenofibrate  160 MG tablet TAKE 1 TABLET BY MOUTH DAILY 90 tablet 1   hydrochlorothiazide  (HYDRODIURIL ) 25 MG tablet Take 1 tablet (25 mg total) by mouth daily. 90 tablet 3    levothyroxine  (SYNTHROID ) 137 MCG tablet Take 1 tablet (137 mcg total) by mouth daily before breakfast. 90 tablet 0   meloxicam  (MOBIC ) 15 MG tablet Take 1 tablet (15 mg total) by mouth daily. 30 tablet 0   meloxicam  (MOBIC ) 15 MG tablet Take 1 tablet (15 mg total) by mouth daily. 30 tablet 0   metFORMIN  (GLUCOPHAGE -XR) 500 MG 24 hr tablet Take 1 tablet (500 mg total) by mouth daily with breakfast. (Patient not taking: Reported on 04/12/2024) 90 tablet 0   methocarbamol  (ROBAXIN ) 500 MG tablet Take 1 tablet (500 mg total) by mouth 3 (three) times daily. 90 tablet 0   methocarbamol  (ROBAXIN ) 500 MG tablet Take 1 tablet (500 mg total) by mouth 3 (three) times daily. 90 tablet 0   rosuvastatin  (CRESTOR ) 10 MG tablet Take 1 tablet (10 mg total) by mouth daily. 30 tablet 2   VIENVA 0.1-20 MG-MCG tablet TAKE 1 TABLET BY MOUTH DAILY 84 tablet 0   zolpidem  (AMBIEN ) 10 MG tablet TAKE 1 TABLET BY MOUTH AT BEDTIME AS NEEDED FOR SLEEP 30 tablet 2   No current facility-administered medications for this visit.      Psychiatric Specialty Exam: Review of Systems  Cardiovascular:  Negative for chest pain and palpitations.  Neurological:  Negative for tremors.  Psychiatric/Behavioral:  Negative for self-injury and suicidal ideas.     There were no vitals taken for this visit.There is no height or weight on file to calculate BMI.  General Appearance: Casual  Eye Contact:  Fair  Speech:  Clear and Coherent  Volume:  Normal  Mood:  fair   Affect:  Full Range  Thought Process:  Goal Directed  Orientation:  Full (Time, Place, and Person)  Thought Content:  Rumination  Suicidal Thoughts:  No  Homicidal Thoughts:  No  Memory:  Immediate;   Fair Recent;   Fair  Judgement:  Fair  Insight:  Fair  Psychomotor Activity:  Normal  Concentration:  Concentration: Fair and Attention Span: Fair  Recall:  [employer] of Knowledge:Good  Language: Good  Akathisia:  No  Handed:    AIMS (if indicated):  not done   Assets:  Desire for Improvement Financial Resources/Insurance Physical Health Social Support  ADL's:  Intact  Cognition: WNL  Sleep:  Poor   Screenings: AUDIT    Flowsheet Row Office Visit from 04/30/2023 in [facility]  Alcohol Use Disorder Identification Test Final Score (AUDIT) 3    PHQ2-9    Flowsheet Row Office Visit from 11/12/2023 in [facility] [employer] Office Visit from 05/28/2023 in [facility] at [facility] Office Visit from 01/09/2022 in [facility] at [facility] Office Visit from 12/13/2020 in [facility] at [facility] Office Visit from 10/19/2020 in [facility] at [facility]  PHQ-2 Total Score 0 0 0 0 0   Flowsheet Row Office Visit from 05/22/2022 in [facility] at [facility] Office Visit from 01/07/2022 in [facility] at [facility] Office Visit from 09/12/2021 in [facility] at [facility]  C-SSRS RISK CATEGORY No Risk No Risk No Risk    Assessment and Plan: as follows Prior documentation reviewed  Mood disorder not otherwise specified; stable continue Pristiq  Insomnia; manageable with Ambien  continue sleep hygiene   generalized anxiety disorder; doing fair continjue pristiq , xanax  FU 6 m or earlier if needed   Fu 9m.  Jackey Flight, MD 2/4/20261:36 PM "

## 2024-10-10 ENCOUNTER — Encounter: Admitting: Family Medicine

## 2025-03-29 ENCOUNTER — Telehealth (HOSPITAL_COMMUNITY): Admitting: Psychiatry
